# Patient Record
Sex: Female | Born: 1945 | Race: White | Hispanic: No | Marital: Married | Smoking: Never smoker
Health system: Southern US, Community
[De-identification: ages and names within clinical notes are randomized; demographics above are authoritative.]

## PROBLEM LIST (undated history)

## (undated) DIAGNOSIS — Q211 Atrial septal defect, unspecified: Secondary | ICD-10-CM

## (undated) DIAGNOSIS — Z9289 Personal history of other medical treatment: Secondary | ICD-10-CM

## (undated) DIAGNOSIS — K219 Gastro-esophageal reflux disease without esophagitis: Secondary | ICD-10-CM

## (undated) DIAGNOSIS — R519 Headache, unspecified: Secondary | ICD-10-CM

## (undated) DIAGNOSIS — Z87442 Personal history of urinary calculi: Secondary | ICD-10-CM

## (undated) DIAGNOSIS — Z9221 Personal history of antineoplastic chemotherapy: Secondary | ICD-10-CM

## (undated) DIAGNOSIS — Z923 Personal history of irradiation: Secondary | ICD-10-CM

## (undated) DIAGNOSIS — H409 Unspecified glaucoma: Secondary | ICD-10-CM

## (undated) DIAGNOSIS — I10 Essential (primary) hypertension: Secondary | ICD-10-CM

## (undated) DIAGNOSIS — R51 Headache: Secondary | ICD-10-CM

## (undated) DIAGNOSIS — E559 Vitamin D deficiency, unspecified: Secondary | ICD-10-CM

## (undated) HISTORY — DX: Vitamin D deficiency, unspecified: E55.9

## (undated) HISTORY — PX: UPPER GI ENDOSCOPY: SHX6162

## (undated) HISTORY — PX: ASD REPAIR: SHX258

## (undated) HISTORY — DX: Headache: R51

## (undated) HISTORY — DX: Headache, unspecified: R51.9

## (undated) HISTORY — PX: TOTAL HIP ARTHROPLASTY: SHX124

## (undated) HISTORY — DX: Atrial septal defect: Q21.1

## (undated) HISTORY — PX: COLONOSCOPY: SHX174

## (undated) HISTORY — PX: BREAST LUMPECTOMY: SHX2

## (undated) HISTORY — DX: Atrial septal defect, unspecified: Q21.10

---

## 2003-03-16 ENCOUNTER — Other Ambulatory Visit: Admission: RE | Admit: 2003-03-16 | Discharge: 2003-03-16 | Payer: Self-pay | Admitting: Internal Medicine

## 2004-10-29 ENCOUNTER — Ambulatory Visit (HOSPITAL_COMMUNITY): Admission: RE | Admit: 2004-10-29 | Discharge: 2004-10-29 | Payer: Self-pay | Admitting: Family Medicine

## 2008-12-19 ENCOUNTER — Encounter: Admission: RE | Admit: 2008-12-19 | Discharge: 2008-12-19 | Payer: Self-pay | Admitting: Family Medicine

## 2009-12-06 ENCOUNTER — Encounter: Admission: RE | Admit: 2009-12-06 | Discharge: 2009-12-06 | Payer: Self-pay | Admitting: Family Medicine

## 2011-09-28 ENCOUNTER — Other Ambulatory Visit: Payer: Self-pay | Admitting: Family Medicine

## 2011-09-28 DIAGNOSIS — E041 Nontoxic single thyroid nodule: Secondary | ICD-10-CM

## 2013-06-11 ENCOUNTER — Encounter (HOSPITAL_COMMUNITY): Payer: Self-pay | Admitting: Emergency Medicine

## 2013-06-11 ENCOUNTER — Observation Stay (HOSPITAL_COMMUNITY)
Admission: EM | Admit: 2013-06-11 | Discharge: 2013-06-12 | Disposition: A | Discharge status: Home / Self Care | Payer: BC Managed Care – PPO | Attending: Internal Medicine | Admitting: Internal Medicine

## 2013-06-11 ENCOUNTER — Emergency Department (HOSPITAL_COMMUNITY): Payer: BC Managed Care – PPO

## 2013-06-11 DIAGNOSIS — H81399 Other peripheral vertigo, unspecified ear: Secondary | ICD-10-CM | POA: Diagnosis not present

## 2013-06-11 DIAGNOSIS — R2681 Unsteadiness on feet: Secondary | ICD-10-CM

## 2013-06-11 DIAGNOSIS — I10 Essential (primary) hypertension: Secondary | ICD-10-CM | POA: Diagnosis present

## 2013-06-11 DIAGNOSIS — K219 Gastro-esophageal reflux disease without esophagitis: Secondary | ICD-10-CM | POA: Diagnosis present

## 2013-06-11 DIAGNOSIS — R42 Dizziness and giddiness: Secondary | ICD-10-CM | POA: Diagnosis present

## 2013-06-11 HISTORY — DX: Gastro-esophageal reflux disease without esophagitis: K21.9

## 2013-06-11 HISTORY — DX: Essential (primary) hypertension: I10

## 2013-06-11 LAB — CBC WITH DIFFERENTIAL/PLATELET
Basophils Relative: 1 % (ref 0–1)
Eosinophils Absolute: 0.1 10*3/uL (ref 0.0–0.7)
HCT: 35.4 % — ABNORMAL LOW (ref 36.0–46.0)
Lymphocytes Relative: 19 % (ref 12–46)
MCH: 30.2 pg (ref 26.0–34.0)
MCHC: 34.7 g/dL (ref 30.0–36.0)
MCV: 87 fL (ref 78.0–100.0)
Monocytes Absolute: 0.5 10*3/uL (ref 0.1–1.0)
Neutrophils Relative %: 73 % (ref 43–77)
Platelets: 304 10*3/uL (ref 150–400)

## 2013-06-11 LAB — URINALYSIS, ROUTINE W REFLEX MICROSCOPIC
Bilirubin Urine: NEGATIVE
Ketones, ur: NEGATIVE mg/dL
Urobilinogen, UA: 0.2 mg/dL (ref 0.0–1.0)
pH: 6.5 (ref 5.0–8.0)

## 2013-06-11 LAB — APTT: aPTT: 27 seconds (ref 24–37)

## 2013-06-11 LAB — BASIC METABOLIC PANEL
Calcium: 9.6 mg/dL (ref 8.4–10.5)
Chloride: 100 mEq/L (ref 96–112)
GFR calc non Af Amer: 62 mL/min — ABNORMAL LOW (ref >90)
Glucose, Bld: 171 mg/dL — ABNORMAL HIGH (ref 70–99)

## 2013-06-11 LAB — TROPONIN I: Troponin I: <0.3 ng/mL (ref <0.30)

## 2013-06-11 MED ORDER — ONDANSETRON HCL 4 MG/2ML IJ SOLN: 4.0000 mg | Freq: Four times a day (QID) | INTRAMUSCULAR | Status: DC | PRN

## 2013-06-11 MED ORDER — HYDROCODONE-ACETAMINOPHEN 5-325 MG PO TABS
1.0000 | ORAL_TABLET | ORAL | Status: DC | PRN
Start: 2013-06-11 — End: 2013-06-12
  Administered 2013-06-11 – 2013-06-12 (×3): 1 via ORAL
  Filled 2013-06-11 (×2): qty 1
  Filled 2013-06-11: qty 2

## 2013-06-11 MED ORDER — SODIUM CHLORIDE 0.9 % IV BOLUS (SEPSIS)
1000.0000 mL | Freq: Once | INTRAVENOUS | Status: AC
  Administered 2013-06-11: 1000 mL via INTRAVENOUS

## 2013-06-11 MED ORDER — MAGNESIUM SULFATE IN D5W 10-5 MG/ML-% IV SOLN
1.0000 g | Freq: Once | INTRAVENOUS | Status: AC
  Administered 2013-06-11: 1 g via INTRAVENOUS
  Filled 2013-06-11 (×2): qty 100

## 2013-06-11 MED ORDER — POLYETHYLENE GLYCOL 3350 17 G PO PACK
17.0000 g | PACK | Freq: Every day | ORAL | Status: DC | PRN
  Filled 2013-06-11: qty 1

## 2013-06-11 MED ORDER — METOCLOPRAMIDE HCL 5 MG/ML IJ SOLN
10.0000 mg | Freq: Once | INTRAMUSCULAR | Status: AC
  Administered 2013-06-11: 10 mg via INTRAVENOUS
  Filled 2013-06-11: qty 2

## 2013-06-11 MED ORDER — DIAZEPAM 2 MG PO TABS: 2.0000 mg | ORAL_TABLET | Freq: Once | ORAL | Status: DC

## 2013-06-11 MED ORDER — SODIUM CHLORIDE 0.9 % IV SOLN
INTRAVENOUS | Status: AC
  Administered 2013-06-11 – 2013-06-12 (×2): via INTRAVENOUS

## 2013-06-11 MED ORDER — HYDROCHLOROTHIAZIDE 25 MG PO TABS
25.0000 mg | ORAL_TABLET | Freq: Every day | ORAL | Status: DC
  Administered 2013-06-11 – 2013-06-12 (×2): 25 mg via ORAL
  Filled 2013-06-11 (×2): qty 1

## 2013-06-11 MED ORDER — SODIUM CHLORIDE 0.9 % IJ SOLN
3.0000 mL | Freq: Two times a day (BID) | INTRAMUSCULAR | Status: DC
  Administered 2013-06-12: 3 mL via INTRAVENOUS

## 2013-06-11 MED ORDER — ALUM & MAG HYDROXIDE-SIMETH 200-200-20 MG/5ML PO SUSP: 30.0000 mL | Freq: Four times a day (QID) | ORAL | Status: DC | PRN

## 2013-06-11 MED ORDER — GUAIFENESIN-DM 100-10 MG/5ML PO SYRP
5.0000 mL | ORAL_SOLUTION | ORAL | Status: DC | PRN
  Filled 2013-06-11: qty 5

## 2013-06-11 MED ORDER — MECLIZINE HCL 25 MG PO TABS
25.0000 mg | ORAL_TABLET | Freq: Three times a day (TID) | ORAL | Status: DC | PRN
  Administered 2013-06-11: 18:00:00 25 mg via ORAL
  Filled 2013-06-11 (×2): qty 1

## 2013-06-11 MED ORDER — MECLIZINE HCL 25 MG PO TABS
50.0000 mg | ORAL_TABLET | Freq: Once | ORAL | Status: AC
  Administered 2013-06-11: 50 mg via ORAL
  Filled 2013-06-11: qty 2

## 2013-06-11 MED ORDER — PANTOPRAZOLE SODIUM 40 MG PO TBEC
40.0000 mg | DELAYED_RELEASE_TABLET | Freq: Every day | ORAL | Status: DC
  Administered 2013-06-11 – 2013-06-12 (×2): 40 mg via ORAL
  Filled 2013-06-11 (×2): qty 1

## 2013-06-11 MED ORDER — DIAZEPAM 5 MG/ML IJ SOLN: 2.5000 mg | Freq: Two times a day (BID) | INTRAMUSCULAR | Status: DC

## 2013-06-11 MED ORDER — DIAZEPAM 2 MG PO TABS: 2.0000 mg | ORAL_TABLET | Freq: Two times a day (BID) | ORAL | Status: DC

## 2013-06-11 MED ORDER — DIAZEPAM 5 MG/ML IJ SOLN
2.5000 mg | Freq: Once | INTRAMUSCULAR | Status: AC
  Administered 2013-06-11: 2.5 mg via INTRAVENOUS
  Filled 2013-06-11: qty 2

## 2013-06-11 MED ORDER — LOSARTAN POTASSIUM 50 MG PO TABS
50.0000 mg | ORAL_TABLET | Freq: Every day | ORAL | Status: DC
  Administered 2013-06-11 – 2013-06-12 (×2): 50 mg via ORAL
  Filled 2013-06-11 (×2): qty 1

## 2013-06-11 MED ORDER — ASPIRIN EC 81 MG PO TBEC
81.0000 mg | DELAYED_RELEASE_TABLET | Freq: Every day | ORAL | Status: DC
  Administered 2013-06-11 – 2013-06-12 (×2): 81 mg via ORAL
  Filled 2013-06-11 (×2): qty 1

## 2013-06-11 MED ORDER — ONDANSETRON HCL 4 MG PO TABS: 4.0000 mg | ORAL_TABLET | Freq: Four times a day (QID) | ORAL | Status: DC | PRN

## 2013-06-11 MED ORDER — SODIUM CHLORIDE 0.9 % IV SOLN
Freq: Once | INTRAVENOUS | Status: AC
  Administered 2013-06-11: 10:00:00 via INTRAVENOUS

## 2013-06-11 MED ORDER — DIAZEPAM 2 MG PO TABS
2.0000 mg | ORAL_TABLET | Freq: Two times a day (BID) | ORAL | Status: DC
  Administered 2013-06-12: 2 mg via ORAL
  Filled 2013-06-11: qty 1

## 2013-06-11 MED ORDER — ALBUTEROL SULFATE (5 MG/ML) 0.5% IN NEBU: 2.5000 mg | INHALATION_SOLUTION | RESPIRATORY_TRACT | Status: DC | PRN

## 2013-06-11 NOTE — H&P (Signed)
[facility]                                                                                    Patient Demographics  Cynthia Vasquez, is a 67 y.o. female  MRN: [id number]   DOB - 16-Jan-1946  Admit Date - 06/11/2013  Outpatient Primary MD for the patient is No primary provider on file.   With History of -  Past Medical History  Diagnosis Date  . Hypertension   . GERD (gastroesophageal reflux disease)       History reviewed. No pertinent past surgical history.  in for   Chief Complaint  Patient presents with  . Dizziness     HPI  Cynthia Vasquez  is a 67 y.o. female, with history of hypertension, GERD who recently had some debris buildup and left ear and had it flushed out by her PCP 2 days ago, she subsequently had some swelling in the left side of her throat, this morning when she wokeup she had an episode of nonstop vertigo which she describes a sensation of spinning along with mild nausea, she was unable to stand or walk, she came to the ER where her head CT and MRI was unremarkable, case was discussed with neurology diagnosed her with peripheral vertigo and requested hospitalist to admit .  Patient denies any headache, no ringing in ears, no hearing loss, no discharge from ears, no fever chills, no chest pain or palpitations, no abdominal pain or diarrhea. No focal weakness the    Review of Systems    In addition to the HPI above,   No Fever-chills, No Headache, No changes with Vision or hearing, No problems swallowing food or Liquids, No Chest pain, Cough or Shortness of Breath, No Abdominal pain, No Nausea or Vommitting, Bowel movements are regular, No Blood in stool or Urine, No dysuria, No new skin rashes or bruises, No new joints pains-aches,  No new weakness, tingling, numbness in any extremity, positive vertigo as above No recent weight gain or loss, No polyuria, polydypsia or polyphagia, No significant Mental Stressors.  A full 10 point Review of Systems  was done, except as stated above, all other Review of Systems were negative.   Social History History  Substance Use Topics  . Smoking status: Never Smoker   . Smokeless tobacco: Not on file  . Alcohol Use: No      Family History No family history of CVA  Prior to Admission medications   Medication Sig Start Date End Date Taking? Authorizing Provider  acetaminophen (TYLENOL) 500 MG tablet Take 500 mg by mouth every 6 (six) hours as needed.   Yes Historical Provider, MD  aspirin EC 81 MG tablet Take 81 mg by mouth daily.   Yes Historical Provider, MD  hydrochlorothiazide (HYDRODIURIL) 25 MG tablet Take 25 mg by mouth daily.   Yes Historical Provider, MD  losartan (COZAAR) 50 MG tablet Take 50 mg by mouth daily.   Yes Historical Provider, MD  pantoprazole (PROTONIX) 40 MG tablet Take 40 mg by mouth daily.   Yes Historical Provider, MD    No Known Allergies  Physical Exam  Vitals  Blood pressure 141/68,  pulse 80, temperature 98.1 F (36.7 C), temperature source Oral, resp. rate 20, SpO2 96.00%.   1. General middle-aged obese Caucasian female lying in bed appears tired but in no discomfort  2. Normal affect and insight, Not Suicidal or Homicidal, Awake Alert, Oriented X 3.  3. No F.N deficits, ALL C.Nerves Intact, Strength 5/5 all 4 extremities, Sensation intact all 4 extremities, Plantars down going.  4. Eyes appear Normal, Conjunctivae clear, PERRLA. Moist Oral Mucosa, left ear with possible small left tympanic membrane disruption, possibly some fluid behind the ear, no pus, no unusual discharge.  5. Supple Neck, No JVD, No cervical lymphadenopathy appriciated, No Carotid Bruits.  6. Symmetrical Chest wall movement, Good air movement bilaterally, CTAB.  7. RRR, No Gallops, Rubs or Murmurs, No Parasternal Heave.  8. Positive Bowel Sounds, Abdomen Soft, Non tender, No organomegaly appriciated,No rebound -guarding or rigidity.  9.  No Cyanosis, Normal Skin Turgor, No Skin  Rash or Bruise.  10. Good muscle tone,  joints appear normal , no effusions, Normal ROM.  11. No Palpable Lymph Nodes in Neck or Axillae     Data Review  CBC  Recent Labs Lab 06/11/13 0830  WBC 8.6  HGB 12.3  HCT 35.4*  PLT 304  MCV 87.0  MCH 30.2  MCHC 34.7  RDW 13.3  LYMPHSABS 1.7  MONOABS 0.5  EOSABS 0.1  BASOSABS 0.0   ------------------------------------------------------------------------------------------------------------------  Chemistries   Recent Labs Lab 06/11/13 0830  NA 139  K 3.6  CL 100  CO2 25  GLUCOSE 171*  BUN 28*  CREATININE 0.94  CALCIUM 9.6   ------------------------------------------------------------------------------------------------------------------ CrCl is unknown because there is no height on file for the current visit. ------------------------------------------------------------------------------------------------------------------ No results found for this basename: TSH, T4TOTAL, FREET3, T3FREE, THYROIDAB,  in the last 72 hours   Coagulation profile  Recent Labs Lab 06/11/13 0830  INR 0.96   ------------------------------------------------------------------------------------------------------------------- No results found for this basename: DDIMER,  in the last 72 hours -------------------------------------------------------------------------------------------------------------------  Cardiac Enzymes  Recent Labs Lab 06/11/13 0830  TROPONINI <0.30   ------------------------------------------------------------------------------------------------------------------ No components found with this basename: POCBNP,    ---------------------------------------------------------------------------------------------------------------  Urinalysis No results found for this basename: colorurine, appearanceur, labspec, phurine, glucoseu, hgbur, bilirubinur, ketonesur, proteinur, urobilinogen, nitrite, leukocytesur     ----------------------------------------------------------------------------------------------------------------  Imaging results:   Ct Head Wo Contrast  06/11/2013   CLINICAL DATA:  Nausea and dizziness.  EXAM: CT HEAD WITHOUT CONTRAST  TECHNIQUE: Contiguous axial images were obtained from the base of the skull through the vertex without intravenous contrast.  COMPARISON:  None.  FINDINGS: Mild small vessel ischemic changes are noted in the periventricular white matter. The brain demonstrates no evidence of hemorrhage, infarction, edema, mass effect, extra-axial fluid collection, hydrocephalus or mass lesion. The skull is unremarkable.  IMPRESSION: Mild small vessel disease. No acute findings.   Electronically Signed   By: Irish Lack M.D.   On: 06/11/2013 08:51   Mr Brain Wo Contrast  06/11/2013   CLINICAL DATA:  Severe vertigo and nausea.  EXAM: MRI HEAD WITHOUT CONTRAST  TECHNIQUE: Multiplanar, multiecho pulse sequences of the brain and surrounding structures were obtained without intravenous contrast.  COMPARISON:  Head CT from the same day.  FINDINGS: The diffusion-weighted images demonstrate no evidence for acute or subacute infarction. A relatively empty sella is present. Midline structures are otherwise within normal limits.  Moderate white matter changes are present within the central pons. Remote lacunar infarcts are present at the cerebellum bilaterally. There are small remote lacunar infarcts  of the basal ganglia bilaterally with a larger lesion in the anterior left lentiform nucleus. Moderate periventricular and subcortical T2 hyperintensities are greater than expected for age.  Flow is present in the major intracranial arteries. The globes and orbits are intact.  Mild mucosal thickening is present in the ethmoid air cells and frontal sinuses bilaterally, right greater than left. Minimal fluid is present in the mastoid air cells. No obstructing nasopharyngeal lesion is evident.   IMPRESSION: 1. No acute intracranial abnormality. 2. Advanced white matter changes throughout the brain and brainstem. This is nonspecific, but likely reflects the sequela of chronic microvascular ischemia. 3. 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 The most prominent area is in the anterior left lentiform nucleus. 4. Minimal sinus disease.   Electronically Signed   By: Gennette Pac M.D.   On: 06/11/2013 12:41    My personal review of EKG: Rhythm NSR,  QTC slightly prolonged at 500 ms, no Acute ST changes    Assessment & Plan   1. Vertigo certainly related to left ear problem caused by left ear being flushed at PCPs office to remove debris - possible small left eardrum rupture, certainly could have irritated her left inner ear, currently no signs of infection, surprisingly she does not have any nystagmus at this point, she will be kept in the hospital, supportive care with scheduled Valium and as needed Antivert, PT OT evaluation, fall precaution. We'll monitor her rhythm although this clearly is vertigo caused by left ear problem. Will recommend ENT followup post discharge.   2. Mildly prolonged QTC. 1 g of magnesium IV, monitor electrolytes, telemetry monitor.    3. History of GERD and hypertension. Continue home medications unchanged.   DVT Prophylaxis  SCDs   AM Labs Ordered, also please review Full Orders  Family Communication: Admission, patients condition and plan of care including tests being ordered have been discussed with the patient and husband who indicate understanding and agree with the plan and Code Status.  Code Status full  Likely DC to  home  Condition Fair  Time spent in minutes : 40    Susa Raring K M.D on 06/11/2013 at 3:44 PM  Between 7am to 7pm - Pager - [phone]  After 7pm go to [email or web address] - password [email or web address]  And look for the night coverage person covering me after hours  [facility] Office   [phone]

## 2013-06-11 NOTE — ED Provider Notes (Addendum)
CSN: [id number]     Arrival date & time 06/11/13  0754 History   First MD Initiated Contact with Patient 06/11/13 0800     Chief Complaint  Patient presents with  . Dizziness   (Consider location/radiation/quality/duration/timing/severity/associated sxs/prior Treatment) HPI Comments: Pt comes in with cc of dizziness. Hx of HTN. Pt reports waking up with severe dizziness -described as vertigo. The dizziness is constant, and is worse with certain position. Pt denies headaches, visual complains, numbness, tingling, no chest pains, tinnitus or hearing loss.  Pt is having some nausea. No hx of strokes. Pt was asymptomatic last night. No new meds.  The history is provided by the patient.    Past Medical History  Diagnosis Date  . Hypertension   . GERD (gastroesophageal reflux disease)    History reviewed. No pertinent past surgical history. History reviewed. No pertinent family history. History  Substance Use Topics  . Smoking status: Never Smoker   . Smokeless tobacco: Not on file  . Alcohol Use: No   OB History   Grav Para Term Preterm Abortions TAB SAB Ect Mult Living                 Review of Systems  Constitutional: Positive for activity change. Negative for fatigue.  HENT: Negative for facial swelling.   Eyes: Negative for photophobia and visual disturbance.  Respiratory: Negative for cough, shortness of breath and wheezing.   Cardiovascular: Negative for chest pain.  Gastrointestinal: Positive for nausea. Negative for vomiting, abdominal pain, diarrhea, constipation, blood in stool and abdominal distention.  Genitourinary: Negative for hematuria and difficulty urinating.  Musculoskeletal: Negative for neck pain and neck stiffness.  Skin: Negative for color change.  Neurological: Positive for dizziness and light-headedness. Negative for speech difficulty.  Hematological: Does not bruise/bleed easily.  Psychiatric/Behavioral: Negative for confusion.    Allergies  Review  of patient's allergies indicates no known allergies.  Home Medications   Current Outpatient Rx  Name  Route  Sig  Dispense  Refill  . acetaminophen (TYLENOL) 500 MG tablet   Oral   Take 500 mg by mouth every 6 (six) hours as needed.         Marland Kitchen aspirin EC 81 MG tablet   Oral   Take 81 mg by mouth daily.         Marland Kitchen HYDROCHLOROTHIAZIDE PO   Oral   Take 1 tablet by mouth daily.         Marland Kitchen LISINOPRIL PO   Oral   Take 1 tablet by mouth daily.         . Pantoprazole Sodium (PROTONIX PO)   Oral   Take 1 tablet by mouth daily.          BP 129/52  Pulse 74  Temp(Src) 97.5 F (36.4 C) (Oral)  Resp 13  SpO2 96% Physical Exam  Nursing note and vitals reviewed. Constitutional: She is oriented to person, place, and time. She appears well-developed and well-nourished.  HENT:  Head: Normocephalic and atraumatic.  Eyes: EOM are normal. Pupils are equal, round, and reactive to light.  Neck: Neck supple.  Cardiovascular: Normal rate, regular rhythm and normal heart sounds.   No murmur heard. Pulmonary/Chest: Effort normal. No respiratory distress.  Abdominal: Soft. She exhibits no distension. There is no tenderness. There is no rebound and no guarding.  Neurological: She is alert and oriented to person, place, and time.  Pt gives a poot neuro exam, possibly due to poor effort, as she states  that she is feeling unwell. PERRL Saccadic eye movement on lateral gaze - both sides. Mild nasolabial flattening left side + dysmetria on finger to nose Sensory exam normal for bilateral upper and lower extremities - and patient is able to discriminate between sharp and dull. Motor exam bilateral weakness on grip strength.    Skin: Skin is warm and dry.    ED Course  Procedures (including critical care time) Labs Review Labs Reviewed  CBC WITH DIFFERENTIAL - Abnormal; Notable for the following:    HCT 35.4 (*)    All other components within normal limits  APTT  PROTIME-INR   BASIC METABOLIC PANEL  TROPONIN I  URINALYSIS, ROUTINE W REFLEX MICROSCOPIC   Imaging Review Ct Head Wo Contrast  06/11/2013   CLINICAL DATA:  Nausea and dizziness.  EXAM: CT HEAD WITHOUT CONTRAST  TECHNIQUE: Contiguous axial images were obtained from the base of the skull through the vertex without intravenous contrast.  COMPARISON:  None.  FINDINGS: Mild small vessel ischemic changes are noted in the periventricular white matter. The brain demonstrates no evidence of hemorrhage, infarction, edema, mass effect, extra-axial fluid collection, hydrocephalus or mass lesion. The skull is unremarkable.  IMPRESSION: Mild small vessel disease. No acute findings.   Electronically Signed   By: Irish Lack M.D.   On: 06/11/2013 08:51    EKG Interpretation     Ventricular Rate:  76 PR Interval:  155 QRS Duration: 102 QT Interval:  446 QTC Calculation: 501 R Axis:   60 Text Interpretation:  Sinus rhythm Prolonged QT interval            MDM  No diagnosis found.  Pt comes in with cc of dizziness and vertigo.  DDx includes: Central vertigo:  Tumor  Stroke  ICH  Vertebrobasilar TIA  Peripheral Vertigo:  BPPV  Vestibular neuritis  Meniere disease  Migrainous vertigo  Ear Infection   Pt comes in with constant dizziness, worse with any movement. Last normal last night. Hard to tell whether this is peripheral or a central cause based on the hx and exam alone - as there is a constant vertigo (central) worse with position (peripheral). No focal neuro deficits. Pt just not comfrotable due to the vertigo, and gives a poor exam. CT head, will reassess for possible MRI.   Derwood Kaplan, MD 06/11/13 0917  9:46 AM CT head is normal. Labs that are in are WNL. Pt feels     Derwood Kaplan, MD 06/11/13 [phone] 3:36 PM MRI is neg. Pt still unstable, s/p antivert x 2 and valium Will admit.  Derwood Kaplan, MD 06/11/13 1536

## 2013-06-11 NOTE — Progress Notes (Signed)
NURSING PROGRESS NOTE  Cynthia Vasquez [id number] Admission Data: 06/11/2013 8:12 PM Attending Provider: Leroy Sea, MD PCP:No primary provider on file. Code Status: Full   Cynthia Vasquez is a 67 y.o. female patient admitted from ED:  -No acute distress noted.  -No complaints of shortness of breath.  -No complaints of chest pain.   Cardiac Monitoring: Box # 10 in place. Cardiac monitor yields:normal sinus rhythm.  Blood pressure 148/77, pulse 78, temperature 98.1 F (36.7 C), temperature source Oral, resp. rate 18, height 5\' 1"  (1.549 m), weight 80.6 kg (177 lb 11.1 oz), SpO2 94.00%.   IV Fluids:  IV in place, occlusive dsg intact without redness, IV cath hand right, condition patent and no redness normal saline.   Allergies:  Review of patient's allergies indicates no known allergies.  Past Medical History:   has a past medical history of Hypertension and GERD (gastroesophageal reflux disease).  Past Surgical History:   has no past surgical history on file.  Social History:   reports that she has never smoked. She does not have any smokeless tobacco history on file. She reports that she does not drink alcohol.  Skin: Intact  Patient/Family orientated to room. Information packet given to patient/family. Admission inpatient armband information verified with patient/family to include name and date of birth and placed on patient arm. Side rails up x 2, fall assessment and education completed with patient/family. Patient/family able to verbalize understanding of risk associated with falls and verbalized understanding to call for assistance before getting out of bed. Call light within reach. Patient/family able to voice and demonstrate understanding of unit orientation instructions.    Will continue to evaluate and treat per MD orders.

## 2013-06-11 NOTE — ED Notes (Signed)
Attempted to ambulated pt, pt states nausea, slight dizziness. Will attempt tp ambulate to after nausea passes.

## 2013-06-11 NOTE — ED Notes (Signed)
Patient transported to MRI 

## 2013-06-11 NOTE — ED Notes (Addendum)
Per EMS, sudden onset of dizziness. sts some nausea. Zofran in route. BP 130/50. Pt anxious.

## 2013-06-12 DIAGNOSIS — H81399 Other peripheral vertigo, unspecified ear: Secondary | ICD-10-CM | POA: Diagnosis not present

## 2013-06-12 DIAGNOSIS — R269 Unspecified abnormalities of gait and mobility: Secondary | ICD-10-CM

## 2013-06-12 LAB — BASIC METABOLIC PANEL
BUN: 16 mg/dL (ref 6–23)
CO2: 25 mEq/L (ref 19–32)
Chloride: 105 mEq/L (ref 96–112)
Creatinine, Ser: 0.88 mg/dL (ref 0.50–1.10)
Glucose, Bld: 92 mg/dL (ref 70–99)
Sodium: 139 mEq/L (ref 135–145)

## 2013-06-12 LAB — CBC
HCT: 34.9 % — ABNORMAL LOW (ref 36.0–46.0)
MCH: 29.9 pg (ref 26.0–34.0)
MCHC: 33.8 g/dL (ref 30.0–36.0)
MCV: 88.6 fL (ref 78.0–100.0)
RDW: 13.6 % (ref 11.5–15.5)
WBC: 7.8 10*3/uL (ref 4.0–10.5)

## 2013-06-12 MED ORDER — DIAZEPAM 2 MG PO TABS: 2.0000 mg | ORAL_TABLET | Freq: Two times a day (BID) | ORAL | Status: DC | PRN

## 2013-06-12 MED ORDER — MECLIZINE HCL 50 MG PO TABS: 50.0000 mg | ORAL_TABLET | Freq: Three times a day (TID) | ORAL | Status: DC | PRN

## 2013-06-12 MED ORDER — MECLIZINE HCL 25 MG PO TABS
25.0000 mg | ORAL_TABLET | Freq: Three times a day (TID) | ORAL | Status: DC | PRN
  Filled 2013-06-12: qty 1

## 2013-06-12 MED ORDER — MECLIZINE HCL 25 MG PO TABS
25.0000 mg | ORAL_TABLET | Freq: Three times a day (TID) | ORAL | Status: DC
  Administered 2013-06-12: 25 mg via ORAL
  Filled 2013-06-12 (×2): qty 1

## 2013-06-12 MED ORDER — POTASSIUM CHLORIDE CRYS ER 20 MEQ PO TBCR
40.0000 meq | EXTENDED_RELEASE_TABLET | Freq: Once | ORAL | Status: AC
  Administered 2013-06-12: 40 meq via ORAL
  Filled 2013-06-12: qty 2

## 2013-06-12 MED ORDER — MECLIZINE HCL 25 MG PO TABS
50.0000 mg | ORAL_TABLET | Freq: Once | ORAL | Status: AC
  Administered 2013-06-12: 50 mg via ORAL
  Filled 2013-06-12: qty 2

## 2013-06-12 NOTE — Progress Notes (Signed)
OT Cancellation Note  Patient Details Name: Cynthia Vasquez MRN: [id number] DOB: 12-Feb-1946   After discussion with PT and review of they PT evaluation feel all of pt's issues are related to her BPPV and was completely independent before this admission.  Will sign off and let PT address vestibular needs on acute and inpatient.   Rodolphe Edmonston 06/12/2013, 12:42 PM

## 2013-06-12 NOTE — Progress Notes (Signed)
Jamal Collin to be D/C'd Home per MD order.  Discussed with the patient and all questions fully answered.    Medication List    STOP taking these medications       hydrochlorothiazide 25 MG tablet  Commonly known as:  HYDRODIURIL      TAKE these medications       acetaminophen 500 MG tablet  Commonly known as:  TYLENOL  Take 500 mg by mouth every 6 (six) hours as needed.     aspirin EC 81 MG tablet  Take 81 mg by mouth daily.     diazepam 2 MG tablet  Commonly known as:  VALIUM  Take 1 tablet (2 mg total) by mouth every 12 (twelve) hours as needed for anxiety (dizziness).     losartan 50 MG tablet  Commonly known as:  COZAAR  Take 50 mg by mouth daily.     meclizine 50 MG tablet  Commonly known as:  ANTIVERT  Take 1 tablet (50 mg total) by mouth 3 (three) times daily as needed for dizziness.     pantoprazole 40 MG tablet  Commonly known as:  PROTONIX  Take 40 mg by mouth daily.        VVS, Skin clean, dry and intact without evidence of skin break down, no evidence of skin tears noted. IV catheter discontinued intact. Site without signs and symptoms of complications. Dressing and pressure applied.  An After Visit Summary was printed and given to the patient. Patient escorted via WC, and D/C home via private auto.  Laurel Dimmer 06/12/2013 4:42 PM

## 2013-06-12 NOTE — Discharge Summary (Signed)
[facility]                                                                                   Cynthia Vasquez, is a 67 y.o. female  DOB 1946-01-09  MRN [id number].  Admission date:  06/11/2013  Admitting Physician  Leroy Sea, MD  Discharge Date:  06/12/2013   Primary MD  No primary provider on file.  Recommendations for primary care physician for things to follow:   Monitor her vertigo clinically, please make sure she follows with ENT on a close basis   Admission Diagnosis  Vertigo [780.4] GERD (gastroesophageal reflux disease) [530.81] Hypertension [401.9] Gait instability [781.2] Peripheral vertigo, unspecified laterality [386.10]  Discharge Diagnosis   vertigo due to left inner ear problem  Principal Problem:   Vertigo Active Problems:   Hypertension   GERD (gastroesophageal reflux disease)      Past Medical History  Diagnosis Date  . Hypertension   . GERD (gastroesophageal reflux disease)     History reviewed. No pertinent past surgical history.   Discharge Condition: Stable   Follow-up Information   Follow up with PCP. Schedule an appointment as soon as possible for a visit in 3 days.      Follow up with Suzanna Obey, MD. Schedule an appointment as soon as possible for a visit in 1 day. (ENT)    Specialty:  Otolaryngology   Contact information:   [address] [place] [address] [phone]       Follow up with [facility]. Schedule an appointment as soon as possible for a visit in 2 days. (Vestibular rehab)    Contact information:   [address] [place] [address] [phone]        Consults obtained - neurology, ENT Dr. Jearld Fenton over the phone   Discharge Medications      Medication List    STOP taking these medications       hydrochlorothiazide 25 MG tablet  Commonly known as:  HYDRODIURIL      TAKE these medications       acetaminophen 500 MG tablet  Commonly known as:   TYLENOL  Take 500 mg by mouth every 6 (six) hours as needed.     aspirin EC 81 MG tablet  Take 81 mg by mouth daily.     diazepam 2 MG tablet  Commonly known as:  VALIUM  Take 1 tablet (2 mg total) by mouth every 12 (twelve) hours as needed for anxiety (dizziness).     losartan 50 MG tablet  Commonly known as:  COZAAR  Take 50 mg by mouth daily.     meclizine 50 MG tablet  Commonly known as:  ANTIVERT  Take 1 tablet (50 mg total) by mouth 3 (three) times daily as needed for dizziness.     pantoprazole 40 MG tablet  Commonly known as:  PROTONIX  Take 40 mg by mouth daily.         Diet and Activity recommendation: See Discharge Instructions below   Discharge Instructions     Follow with Primary MD  in 3 days   Get CBC, CMP,  checked 3 days by Primary MD and again as instructed by your Primary MD.    Get Medicines reviewed and adjusted.  Please request your Prim.MD to go over all Hospital Tests and Procedure/Radiological results at the follow up, please get all Hospital records sent to your Prim MD by signing hospital release before you go home.  Activity: As tolerated with Full fall precautions use walker/cane & assistance as needed   Diet:  Heart Healthy  For Heart failure patients - Check your Weight same time everyday, if you gain over 2 pounds, or you develop in leg swelling, experience more shortness of breath or chest pain, call your Primary MD immediately. Follow Cardiac Low Salt Diet and 1.8 lit/day fluid restriction.  Disposition Home   If you experience worsening of your admission symptoms, develop shortness of breath, life threatening emergency, suicidal or homicidal thoughts you must seek medical attention immediately by calling 911 or calling your MD immediately  if symptoms less severe.  You Must read complete instructions/literature along with all the possible adverse reactions/side effects for all the Medicines you take and that have been prescribed to  you. Take any new Medicines after you have completely understood and accpet all the possible adverse reactions/side effects.   Do not drive and provide baby sitting services if your were admitted for syncope or siezures until you have seen by Primary MD or a Neurologist and advised to do so again.  Do not drive when taking Pain medications.    Do not take more than prescribed Pain, Sleep and Anxiety Medications  Special Instructions: If you have smoked or chewed Tobacco  in the last 2 yrs please stop smoking, stop any regular Alcohol  and or any Recreational drug use.  Wear Seat belts while driving.   Please note  You were cared for by a hospitalist during your hospital stay. If you have any questions about your discharge medications or the care you received while you were in the hospital after you are discharged, you can call the unit and asked to speak with the hospitalist on call if the hospitalist that took care of you is not available. Once you are discharged, your primary care physician will handle any further medical issues. Please note that NO REFILLS for any discharge medications will be authorized once you are discharged, as it is imperative that you return to your primary care physician (or establish a relationship with a primary care physician if you do not have one) for your aftercare needs so that they can reassess your need for medications and monitor your lab values.  Major procedures and Radiology Reports - PLEASE review detailed and final reports for all details, in brief -       Ct Head Wo Contrast  06/11/2013   CLINICAL DATA:  Nausea and dizziness.  EXAM: CT HEAD WITHOUT CONTRAST  TECHNIQUE: Contiguous axial images were obtained from the base of the skull through the vertex without intravenous contrast.  COMPARISON:  None.  FINDINGS: Mild small vessel ischemic changes are noted in the periventricular white matter. The brain demonstrates no evidence of hemorrhage,  infarction, edema, mass effect, extra-axial fluid collection, hydrocephalus or mass lesion. The skull is unremarkable.  IMPRESSION: Mild small vessel disease. No acute findings.   Electronically Signed   By: Irish Lack M.D.   On: 06/11/2013 08:51   Mr Brain Wo Contrast  06/11/2013   CLINICAL DATA:  Severe vertigo and nausea.  EXAM: MRI HEAD WITHOUT CONTRAST  TECHNIQUE:  Multiplanar, multiecho pulse sequences of the brain and surrounding structures were obtained without intravenous contrast.  COMPARISON:  Head CT from the same day.  FINDINGS: The diffusion-weighted images demonstrate no evidence for acute or subacute infarction. A relatively empty sella is present. Midline structures are otherwise within normal limits.  Moderate white matter changes are present within the central pons. Remote lacunar infarcts are present at the cerebellum bilaterally. There are small remote lacunar infarcts of the basal ganglia bilaterally with a larger lesion in the anterior left lentiform nucleus. Moderate periventricular and subcortical T2 hyperintensities are greater than expected for age.  Flow is present in the major intracranial arteries. The globes and orbits are intact.  Mild mucosal thickening is present in the ethmoid air cells and frontal sinuses bilaterally, right greater than left. Minimal fluid is present in the mastoid air cells. No obstructing nasopharyngeal lesion is evident.  IMPRESSION: 1. No acute intracranial abnormality. 2. Advanced white matter changes throughout the brain and brainstem. This is nonspecific, but likely reflects the sequela of chronic microvascular ischemia. 3. 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 The most prominent area is in the anterior left lentiform nucleus. 4. Minimal sinus disease.   Electronically Signed   By: Gennette Pac M.D.   On: 06/11/2013 12:41    Micro Results      No results found for this or any previous visit (from the past  240 hour(s)).   History of present illness and  Hospital Course:     Kindly see H&P for history of present illness and admission details, please review complete Labs, Consult reports and Test reports for all details in brief Cynthia Vasquez, is a 67 y.o. female, patient with history of  hypertension, GERD who recently had her left ear irrigated and flushed at PCP office for debris buildup in the left ear presented to the hospital a few days after the procedure with severe case of vertigo, she was initially ruled out for stroke in the ER with a negative CT and MRI of the brain, neurology saw the patient and they suggested that her symptoms were consistent with vertigo from in her ear problem which I concur with. Patient with neck movement has worsening of her symptoms and develops horizontal nystagmus, on exam she has mildly perforated left eardrum.   She was seen here by PT OT, will get a walker, will be discharged home on meclizine and Valium for supportive care. She has been given vestibular retraining by PT and OT in the hospital and she will get the same in the outpatient setting by the neurology office. I have discussed her case with ENT physician on call Dr. Jearld Fenton who said that patient will need an audio gram at his office tomorrow and he will see her promptly at that time. This was discussed with patient and husband who both agree with it. They will go home and follow with ENT in the outpatient setting.    Low potassium has been replaced.     Today   Subjective:   Cynthia Vasquez today has no headache,no chest abdominal pain,no new weakness tingling or numbness, feels much better wants to go home today.    Objective:   Blood pressure 146/81, pulse 86, temperature 98.3 F (36.8 C), temperature source Oral, resp. rate 18, height 5\' 1"  (1.549 m), weight 80.6 kg (177 lb 11.1 oz), SpO2 93.00%.   Intake/Output Summary (Last 24 hours) at 06/12/13 1211 Last data filed at 06/12/13 0901  Gross  per 24 hour  Intake    870 ml  Output   2500 ml  Net  -1630 ml    Exam Awake Alert, Oriented *3, No new F.N deficits, Normal affect [place].AT,PERRAL, left tympanic membrane has mild rupture without any discharge, she does have horizontal nystagmus Supple Neck,No JVD, No cervical lymphadenopathy appriciated.  Symmetrical Chest wall movement, Good air movement bilaterally, CTAB RRR,No Gallops,Rubs or new Murmurs, No Parasternal Heave +ve B.Sounds, Abd Soft, Non tender, No organomegaly appriciated, No rebound -guarding or rigidity. No Cyanosis, Clubbing or edema, No new Rash or bruise  Data Review   CBC w Diff: Lab Results  Component Value Date   WBC 7.8 06/12/2013   HGB 11.8* 06/12/2013   HCT 34.9* 06/12/2013   PLT 285 06/12/2013   LYMPHOPCT 19 06/11/2013   MONOPCT 6 06/11/2013   EOSPCT 1 06/11/2013   BASOPCT 1 06/11/2013    CMP: Lab Results  Component Value Date   NA 139 06/12/2013   K 3.4* 06/12/2013   CL 105 06/12/2013   CO2 25 06/12/2013   BUN 16 06/12/2013   CREATININE 0.88 06/12/2013  .   Total Time in preparing paper work, data evaluation and todays exam - 35 minutes  Leroy Sea M.D on 06/12/2013 at 12:11 PM  [facility] Office  [phone]

## 2013-06-12 NOTE — Evaluation (Signed)
Physical Therapy Evaluation Patient Details Name: Cynthia Vasquez MRN: [id number] DOB: [birth year] Today's Date: 06/12/2013 Time: 0902-0953 PT Time Calculation (min): 51 min  PT Assessment / Plan / Recommendation History of Present Illness  Cynthia Vasquez  is a 67 y.o. female, with history of hypertension, GERD who recently had some debris buildup and left ear and had it flushed out by her PCP 2 days ago, she subsequently had some swelling in the left side of her throat, this morning when she wokeup she had an episode of nonstop vertigo which she describes a sensation of spinning along with mild nausea, she was unable to stand or walk, she came to the ER where her head CT and MRI was unremarkable, case was discussed with neurology diagnosed her with peripheral vertigo and requested hospitalist to admit .  Clinical Impression  Assessed BP in supine and was [facility], therefore had pt roll to R side in order to quickly assess horizontal canal.  Noted increased nystagmus, indicating positive for R horizontal BPPV.  No further c/o light headedness, therefore did not assess BP.  Then had pt sit up from R SL and noted some mild torsional nystagmus, however was stronger in horizontal, therefore initiated BBQ roll canalith repositioning maneuver to treat BPPV.  Pt very nauseous during treatment, however was able to complete.  Educated pt to what BPPV was, treatment course, and follow up at D/C.  Pt in agreement.  Educated pt to rest for next hour and limit movement to allow inner ear crystals to settle.  Pt/husband verbalize understanding.  Will have OT check with pt later and if needed, this clinician can check back to reassess.  Would recommend pt follow up at outpatient (neurorehabilitation) for more specialized assessment/treatment.  MD notified.     PT Assessment  Patient needs continued PT services    Follow Up Recommendations  Outpatient PT (neurorehabilitation OP)    Does the patient have the potential to  tolerate intense rehabilitation      Barriers to Discharge        Equipment Recommendations  None recommended by PT    Recommendations for Other Services OT consult   Frequency Min 3X/week    Precautions / Restrictions Precautions Precautions: Fall Precaution Comments: horizontal and R BPPV Restrictions Weight Bearing Restrictions: No   Pertinent Vitals/Pain Mild pain in top of head.       Mobility  Bed Mobility Bed Mobility: Rolling Right;Right Sidelying to Sit Rolling Right: 4: Min assist Right Sidelying to Sit: 4: Min assist Details for Bed Mobility Assistance: Min assist for trunk to obtain full R SL position and also for elevating trunk into sitting due to increased dizziness.  Upon rolling to the R, noted increased B eye nystagmus.  Also noted when transitioning from R SL to sitting position, she has slight R (unclear whether R or L) torsional nystagmus.   Transfers Transfers: Sit to Stand;Stand to Sit Sit to Stand: 4: Min assist;From bed Stand to Sit: 4: Min assist;To bed Details for Transfer Assistance: Min assist due to pt anxiety about getting dizzy once up.  Note that she no longer had dizziness once up in standing, therefore did not assess BP any further (was 151/78 in lying).   Ambulation/Gait Ambulation/Gait Assistance: 4: Min assist Ambulation Distance (Feet): 80 Feet Assistive device: 1 person hand held assist Ambulation/Gait Assistance Details: HHA due to increased anxiety with ambulation.  Cued pt for visual target, which she noted to cause improvement in dizziness, while gait training.  Gait Pattern: Step-through pattern;Decreased stride length    Exercises     PT Diagnosis: Difficulty walking  PT Problem List: Decreased balance;Decreased mobility PT Treatment Interventions: Gait training;Stair training;Functional mobility training;Therapeutic activities;Neuromuscular re-education;Balance training;Therapeutic exercise     PT Goals(Current goals can be  found in the care plan section) Acute Rehab PT Goals Patient Stated Goal: to get rid of this dizziness PT Goal Formulation: With patient/family Time For Goal Achievement: 06/14/13 Potential to Achieve Goals: Good  Visit Information  Last PT Received On: 06/12/13 Assistance Needed: +1 History of Present Illness: Cynthia Vasquez  is a 67 y.o. female, with history of hypertension, GERD who recently had some debris buildup and left ear and had it flushed out by her PCP 2 days ago, she subsequently had some swelling in the left side of her throat, this morning when she wokeup she had an episode of nonstop vertigo which she describes a sensation of spinning along with mild nausea, she was unable to stand or walk, she came to the ER where her head CT and MRI was unremarkable, case was discussed with neurology diagnosed her with peripheral vertigo and requested hospitalist to admit .       Prior Functioning  Home Living Family/patient expects to be discharged to:: Private residence Living Arrangements: Spouse/significant other Available Help at Discharge: Family Type of Home: House Home Equipment: None Prior Function Level of Independence: Independent Communication Communication: No difficulties    Cognition  Cognition Arousal/Alertness: Awake/alert Behavior During Therapy: WFL for tasks assessed/performed Overall Cognitive Status: Within Functional Limits for tasks assessed    Extremity/Trunk Assessment Lower Extremity Assessment Lower Extremity Assessment: Overall WFL for tasks assessed Cervical / Trunk Assessment Cervical / Trunk Assessment: Normal   Balance Balance Balance Assessed: Yes Static Standing Balance Static Standing - Balance Support: No upper extremity supported Static Standing - Level of Assistance: 5: Stand by assistance Static Standing - Comment/# of Minutes: Pt able to stand at bedside at stand by assist for approx 2 mins prior to ambulation in hallway.   End of  Session PT - End of Session Activity Tolerance: Other (comment) (limited by dizziness) Patient left: in bed;with call bell/phone within reach;with family/visitor present Nurse Communication: Mobility status;Other (comment) (discussed allowing her to rest due to BPPV)  GP Functional Assessment Tool Used: Clinical judgement Functional Limitation: Mobility: Walking and moving around Mobility: Walking and Moving Around Current Status ([id number]): At least 1 percent but less than 20 percent impaired, limited or restricted Mobility: Walking and Moving Around Goal Status [phone]): 0 percent impaired, limited or restricted   Vista Deck 06/12/2013, 10:16 AM

## 2013-06-12 NOTE — Plan of Care (Signed)
Problem: Phase I Progression Outcomes Goal: Initial discharge plan identified Outcome: Completed/Met Date Met:  06/12/13 To return home with husband

## 2013-06-12 NOTE — Progress Notes (Signed)
Physical Therapy Treatment Patient Details Name: Cynthia Vasquez MRN: [id number] DOB: March 10, 1946 Today's Date: 06/12/2013 Time: 8469-6295 PT Time Calculation (min): 37 min  PT Assessment / Plan / Recommendation  History of Present Illness     PT Comments   Pt continues to be very dizzy with horizontal nystagmus during second session.  Assisted pt by performing G[place] canalith maneuver this afternoon with hopes of repositioning otoliths.  Feel that pt would benefit from staying in hospital one more night in order to reassess tomorrow.  RN made aware.   Follow Up Recommendations  Outpatient PT     Does the patient have the potential to tolerate intense rehabilitation     Barriers to Discharge        Equipment Recommendations  None recommended by PT    Recommendations for Other Services    Frequency Min 3X/week   Progress towards PT Goals Progress towards PT goals: Progressing toward goals  Plan Current plan remains appropriate    Precautions / Restrictions Precautions Precautions: Fall Precaution Comments: horizontal and R BPPV Restrictions Weight Bearing Restrictions: No   Pertinent Vitals/Pain No pain    Mobility  Bed Mobility Bed Mobility: Rolling Right;Right Sidelying to Sit Rolling Right: 4: Min assist Right Sidelying to Sit: 4: Min assist Details for Bed Mobility Assistance: Min assist for trunk to obtain full R SL position and also for elevating trunk into sitting due to increased dizziness.  Upon rolling to the R, noted increased B eye nystagmus.  Also noted when transitioning from R SL to sitting position, she has slight R (unclear whether R or L) torsional nystagmus.   Transfers Transfers: Sit to Stand;Stand to Sit Sit to Stand: 4: Min assist;From bed Stand to Sit: 4: Min assist;To bed Details for Transfer Assistance: Min assist due to pt anxiety about getting dizzy once up.  Note that she no longer had dizziness once up in standing, therefore did not assess BP  any further (was 151/78 in lying).      Exercises     PT Diagnosis:    PT Problem List:   PT Treatment Interventions:     PT Goals (current goals can now be found in the care plan section) Acute Rehab PT Goals Patient Stated Goal: to get rid of this dizziness PT Goal Formulation: With patient/family Time For Goal Achievement: 06/14/13 Potential to Achieve Goals: Good  Visit Information  Last PT Received On: 06/12/13    Subjective Data  Subjective: I still don't feel good when I roll Patient Stated Goal: to get rid of this dizziness   Cognition  Cognition Arousal/Alertness: Awake/alert Behavior During Therapy: WFL for tasks assessed/performed Overall Cognitive Status: Within Functional Limits for tasks assessed    Balance     End of Session PT - End of Session Activity Tolerance: Other (comment) (Limited by dizziness) Patient left: in bed;with call bell/phone within reach;with family/visitor present Nurse Communication: Mobility status;Other (comment)   GP Functional Assessment Tool Used: Clinical judgement Functional Limitation: Mobility: Walking and moving around Mobility: Walking and Moving Around Current Status [phone]): At least 1 percent but less than 20 percent impaired, limited or restricted Mobility: Walking and Moving Around Goal Status [phone]): 0 percent impaired, limited or restricted Mobility: Walking and Moving Around Discharge Status [phone]): 0 percent impaired, limited or restricted   Vista Deck 06/12/2013, 4:47 PM

## 2013-06-12 NOTE — Progress Notes (Signed)
UR COMPLETED  

## 2013-06-12 NOTE — Care Management Note (Signed)
    Page 1 of 1   06/12/2013     12:35:23 PM   CARE MANAGEMENT NOTE 06/12/2013  Patient:  Cynthia Vasquez, Cynthia Vasquez   Account Number:  [id number]  Date Initiated:  06/12/2013  Documentation initiated by:  Letha Cape  Subjective/Objective Assessment:   dx vertigo  admit as observation.  lives with spouse.     Action/Plan:   pt eval- rec outp pt- referral faxed for outpt pt.   Anticipated DC Date:  06/12/2013   Anticipated DC Plan:  HOME/SELF CARE      DC Planning Services  CM consult      Choice offered to / List presented to:             Status of service:  Completed, signed off Medicare Important Message given?   (If response is "NO", the following Medicare IM given date fields will be blank) Date Medicare IM given:   Date Additional Medicare IM given:    Discharge Disposition:  HOME/SELF CARE  Per UR Regulation:  Reviewed for med. necessity/level of care/duration of stay  If discussed at Long Length of Stay Meetings, dates discussed:    Comments:  06/12/13 12:33 Letha Cape RN, BSN [phone] patient lives with spouse, per physical therapy recs out pt pt.  [facility] Patient for dc today.

## 2013-06-12 NOTE — Progress Notes (Signed)
OT Cancellation Note  Patient Details Name: HARTLYN REIGEL MRN: [id number] DOB: Jul 05, 1946   Cancelled Treatment:    Reason Eval/Treat Not Completed: Pain limiting ability to participate (Pt nauseas and wanting to rest at this time.) Will check back as time permits.  Myrth Dahan OTR/L 06/12/2013, 10:43 AM

## 2014-09-04 DIAGNOSIS — H2513 Age-related nuclear cataract, bilateral: Secondary | ICD-10-CM | POA: Diagnosis not present

## 2014-09-04 DIAGNOSIS — H40033 Anatomical narrow angle, bilateral: Secondary | ICD-10-CM | POA: Diagnosis not present

## 2014-09-08 ENCOUNTER — Encounter: Payer: Self-pay | Admitting: *Deleted

## 2015-01-31 ENCOUNTER — Other Ambulatory Visit: Payer: Self-pay | Admitting: Family Medicine

## 2015-01-31 DIAGNOSIS — Z1389 Encounter for screening for other disorder: Secondary | ICD-10-CM | POA: Diagnosis not present

## 2015-01-31 DIAGNOSIS — Z0000 Encounter for general adult medical examination without abnormal findings: Secondary | ICD-10-CM | POA: Diagnosis not present

## 2015-01-31 DIAGNOSIS — E041 Nontoxic single thyroid nodule: Secondary | ICD-10-CM | POA: Diagnosis not present

## 2015-01-31 DIAGNOSIS — K219 Gastro-esophageal reflux disease without esophagitis: Secondary | ICD-10-CM | POA: Diagnosis not present

## 2015-01-31 DIAGNOSIS — E559 Vitamin D deficiency, unspecified: Secondary | ICD-10-CM | POA: Diagnosis not present

## 2015-01-31 DIAGNOSIS — I10 Essential (primary) hypertension: Secondary | ICD-10-CM | POA: Diagnosis not present

## 2015-01-31 DIAGNOSIS — E780 Pure hypercholesterolemia: Secondary | ICD-10-CM | POA: Diagnosis not present

## 2015-01-31 DIAGNOSIS — Z79899 Other long term (current) drug therapy: Secondary | ICD-10-CM | POA: Diagnosis not present

## 2015-02-07 ENCOUNTER — Ambulatory Visit
Admission: RE | Admit: 2015-02-07 | Discharge: 2015-02-07 | Disposition: A | Discharge status: Home / Self Care | Payer: Commercial Managed Care - HMO | Source: Ambulatory Visit | Attending: Family Medicine | Admitting: Family Medicine

## 2015-02-07 DIAGNOSIS — E041 Nontoxic single thyroid nodule: Secondary | ICD-10-CM

## 2015-02-07 DIAGNOSIS — E042 Nontoxic multinodular goiter: Secondary | ICD-10-CM | POA: Diagnosis not present

## 2015-02-08 DIAGNOSIS — Z1231 Encounter for screening mammogram for malignant neoplasm of breast: Secondary | ICD-10-CM | POA: Diagnosis not present

## 2015-02-08 DIAGNOSIS — Z780 Asymptomatic menopausal state: Secondary | ICD-10-CM | POA: Diagnosis not present

## 2015-03-08 ENCOUNTER — Ambulatory Visit (INDEPENDENT_AMBULATORY_CARE_PROVIDER_SITE_OTHER): Payer: Commercial Managed Care - HMO | Admitting: Diagnostic Neuroimaging

## 2015-03-08 ENCOUNTER — Encounter: Payer: Self-pay | Admitting: Diagnostic Neuroimaging

## 2015-03-08 VITALS — BP 128/70 | HR 72 | Ht 60.0 in | Wt 170.8 lb

## 2015-03-08 DIAGNOSIS — I679 Cerebrovascular disease, unspecified: Secondary | ICD-10-CM

## 2015-03-08 DIAGNOSIS — R42 Dizziness and giddiness: Secondary | ICD-10-CM

## 2015-03-08 NOTE — Progress Notes (Signed)
[facility]  PATIENT: Cynthia Vasquez DOB: 1945-10-10  REFERRING CLINICIAN: Edwin Dada  HISTORY FROM: patient  REASON FOR VISIT: new consult    HISTORICAL  CHIEF COMPLAINT:  Chief Complaint  Patient presents with  . Transient Ischemic Attack    rm 7, h/o TIA per MRI, New patient     HISTORY OF PRESENT ILLNESS:   69 year old female with hypertension, migraine, history of atrial septal defect status post repair at age 60 years old, here for evaluation of abnormal MRI and possible stroke.  06/11/13, patient had onset of head spinning, nausea, vertigo symptoms, occurring 2 days after left external auditory canal debris irrigation at PCP office. Patient was admitted for evaluation. She was diagnosed with peripheral vestibulopathy. MRI of the brain showed chronic small vessel see me disease without acute findings.  Patient has never had unilateral numbness, weakness, slurred speech, facial droop, unilateral visual loss or other strokelike symptoms.  Patient is doing well overall. She does struggle with some insomnia and mild anxiety symptoms but overall doing well.  Patient has some history of migraine headaches since age 57 years old which have been stable.  REVIEW OF SYSTEMS: Full 14 system review of systems performed and notable only for only as per history of present illness.  ALLERGIES: No Known Allergies  HOME MEDICATIONS: Outpatient Prescriptions Prior to Visit  Medication Sig Dispense Refill  . acetaminophen (TYLENOL) 500 MG tablet Take 500 mg by mouth every 6 (six) hours as needed.    . calcium citrate-vitamin D (CITRACAL+D) 315-200 MG-UNIT per tablet Take 1 tablet by mouth daily.    . cholecalciferol (VITAMIN D) 1000 UNITS tablet Take 1,000 Units by mouth daily.    . diazepam (VALIUM) 2 MG tablet Take 1 tablet (2 mg total) by mouth every 12 (twelve) hours as needed for anxiety (dizziness). 20 tablet 0  . hydrochlorothiazide (HYDRODIURIL) 25 MG tablet  Take 25 mg by mouth daily.    Marland Kitchen losartan (COZAAR) 50 MG tablet Take 50 mg by mouth daily.    . Magnesium 300 MG CAPS Take 1 capsule by mouth daily.    . meclizine (ANTIVERT) 50 MG tablet Take 1 tablet (50 mg total) by mouth 3 (three) times daily as needed for dizziness. 30 tablet 0  . Multiple Vitamins-Minerals (MULTIVITAMIN WITH MINERALS) tablet Take 1 tablet by mouth daily.    . Omega-3 Fatty Acids (FISH OIL) 1000 MG CAPS Take 1 capsule by mouth daily.    . pantoprazole (PROTONIX) 40 MG tablet Take 40 mg by mouth daily.    Marland Kitchen aspirin EC 81 MG tablet Take 81 mg by mouth daily.     No facility-administered medications prior to visit.    PAST MEDICAL HISTORY: Past Medical History  Diagnosis Date  . Hypertension   . GERD (gastroesophageal reflux disease)   . Atrial septal defect     repaired age 67  . Vitamin D deficiency   . Headache     migraines until age 60- 24, have them 3-4 x year    PAST SURGICAL HISTORY: Past Surgical History  Procedure Laterality Date  . Asd repair      age 22    FAMILY HISTORY: Family History  Problem Relation Age of Onset  . Dementia Mother   . Heart attack Father   . Hypertension Father     SOCIAL HISTORY:  History   Social History  . Marital Status: Married    Spouse Name: [name]  . Number of Children:  3  . Years of Education: 16   Occupational History  . teacher     retired   Social History Main Topics  . Smoking status: Never Smoker   . Smokeless tobacco: Not on file  . Alcohol Use: No  . Drug Use: No  . Sexual Activity: Not on file   Other Topics Concern  . Not on file   Social History Narrative   Lives at home with husband   Caffeine use- green tea 1-2 glasses a day     PHYSICAL EXAM  GENERAL EXAM/CONSTITUTIONAL: Vitals:  Filed Vitals:   03/08/15 1057  BP: 128/70  Pulse: 72  Height: 5' (1.524 m)  Weight: 170 lb 12.8 oz (77.474 kg)     Body mass index is 33.36 kg/(m^2).  Visual Acuity Screening   Right  eye Left eye Both eyes  Without correction:     With correction: 20/30 20/40      Patient is in no distress; well developed, nourished and groomed; neck is supple  CARDIOVASCULAR:  Examination of carotid arteries is normal; no carotid bruits  Regular rate and rhythm, no murmurs  Examination of peripheral vascular system by observation and palpation is normal  EYES:  Ophthalmoscopic exam of optic discs and posterior segments is normal; no papilledema or hemorrhages  MUSCULOSKELETAL:  Gait, strength, tone, movements noted in Neurologic exam below  NEUROLOGIC: MENTAL STATUS:  No flowsheet data found.  awake, alert, oriented to person, place and time  recent and remote memory intact  normal attention and concentration  language fluent, comprehension intact, naming intact,   fund of knowledge appropriate  CRANIAL NERVE:   2nd - no papilledema on fundoscopic exam  2nd, 3rd, 4th, 6th - pupils equal and reactive to light, visual fields full to confrontation, extraocular muscles intact, no nystagmus  5th - facial sensation symmetric  7th - facial strength symmetric  8th - hearing intact  9th - palate elevates symmetrically, uvula midline  11th - shoulder shrug symmetric  12th - tongue protrusion midline  MOTOR:   normal bulk and tone, full strength in the BUE, BLE  SENSORY:   normal and symmetric to light touch, pinprick, temperature, vibration  COORDINATION:   finger-nose-finger, fine finger movements normal  REFLEXES:   deep tendon reflexes present and symmetric  GAIT/STATION:   narrow based gait; able to walk tandem; romberg is negative    DIAGNOSTIC DATA (LABS, IMAGING, TESTING) - I reviewed patient records, labs, notes, testing and imaging myself where available.  Lab Results  Component Value Date   WBC 7.8 06/12/2013   HGB 11.8* 06/12/2013   HCT 34.9* 06/12/2013   MCV 88.6 06/12/2013   PLT 285 06/12/2013      Component Value  Date/Time   NA 139 06/12/2013 0545   K 3.4* 06/12/2013 0545   CL 105 06/12/2013 0545   CO2 25 06/12/2013 0545   GLUCOSE 92 06/12/2013 0545   BUN 16 06/12/2013 0545   CREATININE 0.88 06/12/2013 0545   CALCIUM 8.8 06/12/2013 0545   GFRNONAA 67* 06/12/2013 0545   GFRAA 78* 06/12/2013 0545   No results found for: CHOL, HDL, LDLCALC, LDLDIRECT, TRIG, CHOLHDL No results found for: HGBA1C No results found for: VITAMINB12 No results found for: TSH   06/11/13 MRI brain [I reviewed images myself and agree with interpretation, although I would consider all the white matter lesions within the spectrum of chronic small vessel ischemic disease, as patient never had focal stroke symptoms. -VRP]  1. No  acute intracranial abnormality. 2. Advanced white matter changes throughout the brain and brainstem. This is nonspecific, but likely reflects the sequela of chronic microvascular ischemia. 3. 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 The most prominent area is in the anterior left lentiform nucleus. 4. Minimal sinus disease.     ASSESSMENT AND PLAN  69 y.o. year old female here with history of vertigo attack November 2014, diagnosed with peripheral vestibulopathy. MRI brain shows chronic small vessel ischemic disease changes likely related to hypertension. No acute findings. No further workup advised.  Dx:  Cerebrovascular disease  Vertigo   PLAN: - start aspirin 81mg  daily - continue BP control - healthy habits reviewed with nutrition, physical activity, sleep   Return if symptoms worsen or fail to improve, for return to PCP.    Penni Bombard, MD 0/08/270, 53:66 AM Certified in Neurology, Neurophysiology and Neuroimaging  [facility] [address] [place], [place] [address] [phone]

## 2015-03-08 NOTE — Patient Instructions (Signed)
Start aspirin 81mg  daily.  Continue other medications.

## 2015-04-09 DIAGNOSIS — S46819A Strain of other muscles, fascia and tendons at shoulder and upper arm level, unspecified arm, initial encounter: Secondary | ICD-10-CM | POA: Diagnosis not present

## 2016-01-01 DIAGNOSIS — N183 Chronic kidney disease, stage 3 (moderate): Secondary | ICD-10-CM | POA: Diagnosis not present

## 2016-01-01 DIAGNOSIS — I10 Essential (primary) hypertension: Secondary | ICD-10-CM | POA: Diagnosis not present

## 2016-01-01 DIAGNOSIS — E041 Nontoxic single thyroid nodule: Secondary | ICD-10-CM | POA: Diagnosis not present

## 2016-01-01 DIAGNOSIS — K219 Gastro-esophageal reflux disease without esophagitis: Secondary | ICD-10-CM | POA: Diagnosis not present

## 2016-01-01 DIAGNOSIS — E7800 Pure hypercholesterolemia, unspecified: Secondary | ICD-10-CM | POA: Diagnosis not present

## 2016-01-01 DIAGNOSIS — E559 Vitamin D deficiency, unspecified: Secondary | ICD-10-CM | POA: Diagnosis not present

## 2016-01-03 ENCOUNTER — Other Ambulatory Visit: Payer: Self-pay | Admitting: Family Medicine

## 2016-01-03 DIAGNOSIS — E041 Nontoxic single thyroid nodule: Secondary | ICD-10-CM

## 2016-02-12 ENCOUNTER — Ambulatory Visit
Admission: RE | Admit: 2016-02-12 | Discharge: 2016-02-12 | Disposition: A | Discharge status: Home / Self Care | Payer: Commercial Managed Care - HMO | Source: Ambulatory Visit | Attending: Family Medicine | Admitting: Family Medicine

## 2016-02-12 DIAGNOSIS — E041 Nontoxic single thyroid nodule: Secondary | ICD-10-CM

## 2016-09-08 DIAGNOSIS — H2513 Age-related nuclear cataract, bilateral: Secondary | ICD-10-CM | POA: Diagnosis not present

## 2016-09-08 DIAGNOSIS — H40013 Open angle with borderline findings, low risk, bilateral: Secondary | ICD-10-CM | POA: Diagnosis not present

## 2016-11-03 ENCOUNTER — Other Ambulatory Visit: Payer: Self-pay | Admitting: Family Medicine

## 2016-11-03 DIAGNOSIS — J069 Acute upper respiratory infection, unspecified: Secondary | ICD-10-CM | POA: Diagnosis not present

## 2016-11-03 DIAGNOSIS — E041 Nontoxic single thyroid nodule: Secondary | ICD-10-CM

## 2016-11-03 DIAGNOSIS — N183 Chronic kidney disease, stage 3 (moderate): Secondary | ICD-10-CM | POA: Diagnosis not present

## 2016-11-03 DIAGNOSIS — K219 Gastro-esophageal reflux disease without esophagitis: Secondary | ICD-10-CM | POA: Diagnosis not present

## 2016-11-03 DIAGNOSIS — I10 Essential (primary) hypertension: Secondary | ICD-10-CM | POA: Diagnosis not present

## 2016-11-03 DIAGNOSIS — E559 Vitamin D deficiency, unspecified: Secondary | ICD-10-CM | POA: Diagnosis not present

## 2016-11-03 DIAGNOSIS — E7800 Pure hypercholesterolemia, unspecified: Secondary | ICD-10-CM | POA: Diagnosis not present

## 2016-11-12 DIAGNOSIS — H401131 Primary open-angle glaucoma, bilateral, mild stage: Secondary | ICD-10-CM | POA: Diagnosis not present

## 2016-12-03 ENCOUNTER — Other Ambulatory Visit: Payer: Self-pay | Admitting: Family Medicine

## 2016-12-03 DIAGNOSIS — Z1231 Encounter for screening mammogram for malignant neoplasm of breast: Secondary | ICD-10-CM

## 2016-12-22 ENCOUNTER — Ambulatory Visit: Payer: Commercial Managed Care - HMO

## 2016-12-23 ENCOUNTER — Ambulatory Visit
Admission: RE | Admit: 2016-12-23 | Discharge: 2016-12-23 | Disposition: A | Discharge status: Home / Self Care | Payer: Medicare HMO | Source: Ambulatory Visit | Attending: Family Medicine | Admitting: Family Medicine

## 2016-12-23 DIAGNOSIS — E041 Nontoxic single thyroid nodule: Secondary | ICD-10-CM

## 2016-12-23 DIAGNOSIS — Z1231 Encounter for screening mammogram for malignant neoplasm of breast: Secondary | ICD-10-CM | POA: Diagnosis not present

## 2016-12-23 DIAGNOSIS — E042 Nontoxic multinodular goiter: Secondary | ICD-10-CM | POA: Diagnosis not present

## 2017-02-19 DIAGNOSIS — H40013 Open angle with borderline findings, low risk, bilateral: Secondary | ICD-10-CM | POA: Diagnosis not present

## 2017-05-21 DIAGNOSIS — H401122 Primary open-angle glaucoma, left eye, moderate stage: Secondary | ICD-10-CM | POA: Diagnosis not present

## 2017-06-07 DIAGNOSIS — I10 Essential (primary) hypertension: Secondary | ICD-10-CM | POA: Diagnosis not present

## 2017-06-07 DIAGNOSIS — E041 Nontoxic single thyroid nodule: Secondary | ICD-10-CM | POA: Diagnosis not present

## 2017-06-07 DIAGNOSIS — E559 Vitamin D deficiency, unspecified: Secondary | ICD-10-CM | POA: Diagnosis not present

## 2017-06-07 DIAGNOSIS — E7800 Pure hypercholesterolemia, unspecified: Secondary | ICD-10-CM | POA: Diagnosis not present

## 2017-06-07 DIAGNOSIS — R42 Dizziness and giddiness: Secondary | ICD-10-CM | POA: Diagnosis not present

## 2017-06-07 DIAGNOSIS — K219 Gastro-esophageal reflux disease without esophagitis: Secondary | ICD-10-CM | POA: Diagnosis not present

## 2017-06-07 DIAGNOSIS — N183 Chronic kidney disease, stage 3 (moderate): Secondary | ICD-10-CM | POA: Diagnosis not present

## 2017-09-24 DIAGNOSIS — H401131 Primary open-angle glaucoma, bilateral, mild stage: Secondary | ICD-10-CM | POA: Diagnosis not present

## 2018-01-11 ENCOUNTER — Other Ambulatory Visit: Payer: Self-pay | Admitting: Family Medicine

## 2018-01-11 DIAGNOSIS — E041 Nontoxic single thyroid nodule: Secondary | ICD-10-CM

## 2018-01-21 DIAGNOSIS — H5212 Myopia, left eye: Secondary | ICD-10-CM | POA: Diagnosis not present

## 2018-01-21 DIAGNOSIS — H5201 Hypermetropia, right eye: Secondary | ICD-10-CM | POA: Diagnosis not present

## 2018-01-21 DIAGNOSIS — H401131 Primary open-angle glaucoma, bilateral, mild stage: Secondary | ICD-10-CM | POA: Diagnosis not present

## 2018-01-28 ENCOUNTER — Ambulatory Visit
Admission: RE | Admit: 2018-01-28 | Discharge: 2018-01-28 | Disposition: A | Discharge status: Home / Self Care | Payer: Medicare HMO | Source: Ambulatory Visit | Attending: Family Medicine | Admitting: Family Medicine

## 2018-01-28 DIAGNOSIS — E041 Nontoxic single thyroid nodule: Secondary | ICD-10-CM

## 2018-01-28 DIAGNOSIS — E042 Nontoxic multinodular goiter: Secondary | ICD-10-CM | POA: Diagnosis not present

## 2018-02-15 DIAGNOSIS — K219 Gastro-esophageal reflux disease without esophagitis: Secondary | ICD-10-CM | POA: Diagnosis not present

## 2018-02-15 DIAGNOSIS — E7800 Pure hypercholesterolemia, unspecified: Secondary | ICD-10-CM | POA: Diagnosis not present

## 2018-02-15 DIAGNOSIS — E041 Nontoxic single thyroid nodule: Secondary | ICD-10-CM | POA: Diagnosis not present

## 2018-02-15 DIAGNOSIS — I10 Essential (primary) hypertension: Secondary | ICD-10-CM | POA: Diagnosis not present

## 2018-02-15 DIAGNOSIS — E559 Vitamin D deficiency, unspecified: Secondary | ICD-10-CM | POA: Diagnosis not present

## 2018-02-15 DIAGNOSIS — N183 Chronic kidney disease, stage 3 (moderate): Secondary | ICD-10-CM | POA: Diagnosis not present

## 2018-02-17 DIAGNOSIS — I10 Essential (primary) hypertension: Secondary | ICD-10-CM | POA: Diagnosis not present

## 2018-02-17 DIAGNOSIS — E559 Vitamin D deficiency, unspecified: Secondary | ICD-10-CM | POA: Diagnosis not present

## 2018-02-17 DIAGNOSIS — N183 Chronic kidney disease, stage 3 (moderate): Secondary | ICD-10-CM | POA: Diagnosis not present

## 2018-02-17 DIAGNOSIS — K219 Gastro-esophageal reflux disease without esophagitis: Secondary | ICD-10-CM | POA: Diagnosis not present

## 2018-02-17 DIAGNOSIS — E7800 Pure hypercholesterolemia, unspecified: Secondary | ICD-10-CM | POA: Diagnosis not present

## 2018-02-17 DIAGNOSIS — E041 Nontoxic single thyroid nodule: Secondary | ICD-10-CM | POA: Diagnosis not present

## 2018-03-17 DIAGNOSIS — R945 Abnormal results of liver function studies: Secondary | ICD-10-CM | POA: Diagnosis not present

## 2018-03-25 DIAGNOSIS — M545 Low back pain: Secondary | ICD-10-CM | POA: Diagnosis not present

## 2018-03-25 DIAGNOSIS — R7989 Other specified abnormal findings of blood chemistry: Secondary | ICD-10-CM | POA: Diagnosis not present

## 2018-03-29 ENCOUNTER — Other Ambulatory Visit: Payer: Self-pay | Admitting: Family Medicine

## 2018-03-29 DIAGNOSIS — R945 Abnormal results of liver function studies: Principal | ICD-10-CM

## 2018-03-29 DIAGNOSIS — R7989 Other specified abnormal findings of blood chemistry: Secondary | ICD-10-CM

## 2018-04-06 ENCOUNTER — Ambulatory Visit
Admission: RE | Admit: 2018-04-06 | Discharge: 2018-04-06 | Disposition: A | Discharge status: Home / Self Care | Payer: Medicare HMO | Source: Ambulatory Visit | Attending: Family Medicine | Admitting: Family Medicine

## 2018-04-06 DIAGNOSIS — R7989 Other specified abnormal findings of blood chemistry: Secondary | ICD-10-CM

## 2018-04-06 DIAGNOSIS — R945 Abnormal results of liver function studies: Principal | ICD-10-CM

## 2018-04-18 DIAGNOSIS — K8050 Calculus of bile duct without cholangitis or cholecystitis without obstruction: Secondary | ICD-10-CM | POA: Diagnosis not present

## 2018-04-21 ENCOUNTER — Other Ambulatory Visit: Payer: Self-pay | Admitting: General Surgery

## 2018-04-21 DIAGNOSIS — K8050 Calculus of bile duct without cholangitis or cholecystitis without obstruction: Secondary | ICD-10-CM

## 2018-04-29 ENCOUNTER — Ambulatory Visit
Admission: RE | Admit: 2018-04-29 | Discharge: 2018-04-29 | Disposition: A | Discharge status: Home / Self Care | Payer: Medicare HMO | Source: Ambulatory Visit | Attending: General Surgery | Admitting: General Surgery

## 2018-04-29 DIAGNOSIS — R932 Abnormal findings on diagnostic imaging of liver and biliary tract: Secondary | ICD-10-CM | POA: Diagnosis not present

## 2018-04-29 DIAGNOSIS — K8050 Calculus of bile duct without cholangitis or cholecystitis without obstruction: Secondary | ICD-10-CM

## 2018-04-29 DIAGNOSIS — R7989 Other specified abnormal findings of blood chemistry: Secondary | ICD-10-CM | POA: Diagnosis not present

## 2018-04-29 MED ORDER — GADOBENATE DIMEGLUMINE 529 MG/ML IV SOLN
15.0000 mL | Freq: Once | INTRAVENOUS | Status: AC | PRN
  Administered 2018-04-29: 15 mL via INTRAVENOUS

## 2018-05-23 DIAGNOSIS — H401131 Primary open-angle glaucoma, bilateral, mild stage: Secondary | ICD-10-CM | POA: Diagnosis not present

## 2018-05-30 DIAGNOSIS — K219 Gastro-esophageal reflux disease without esophagitis: Secondary | ICD-10-CM | POA: Diagnosis not present

## 2018-05-30 DIAGNOSIS — R748 Abnormal levels of other serum enzymes: Secondary | ICD-10-CM | POA: Diagnosis not present

## 2018-08-23 DIAGNOSIS — E7800 Pure hypercholesterolemia, unspecified: Secondary | ICD-10-CM | POA: Diagnosis not present

## 2018-08-23 DIAGNOSIS — I10 Essential (primary) hypertension: Secondary | ICD-10-CM | POA: Diagnosis not present

## 2018-08-23 DIAGNOSIS — K219 Gastro-esophageal reflux disease without esophagitis: Secondary | ICD-10-CM | POA: Diagnosis not present

## 2018-08-23 DIAGNOSIS — Z23 Encounter for immunization: Secondary | ICD-10-CM | POA: Diagnosis not present

## 2018-08-23 DIAGNOSIS — N183 Chronic kidney disease, stage 3 (moderate): Secondary | ICD-10-CM | POA: Diagnosis not present

## 2018-08-23 DIAGNOSIS — M545 Low back pain: Secondary | ICD-10-CM | POA: Diagnosis not present

## 2018-08-23 DIAGNOSIS — E559 Vitamin D deficiency, unspecified: Secondary | ICD-10-CM | POA: Diagnosis not present

## 2018-09-05 DIAGNOSIS — M5416 Radiculopathy, lumbar region: Secondary | ICD-10-CM | POA: Diagnosis not present

## 2018-09-05 DIAGNOSIS — M545 Low back pain: Secondary | ICD-10-CM | POA: Diagnosis not present

## 2018-09-16 DIAGNOSIS — M545 Low back pain: Secondary | ICD-10-CM | POA: Diagnosis not present

## 2018-09-20 DIAGNOSIS — H401131 Primary open-angle glaucoma, bilateral, mild stage: Secondary | ICD-10-CM | POA: Diagnosis not present

## 2018-09-23 DIAGNOSIS — M545 Low back pain: Secondary | ICD-10-CM | POA: Diagnosis not present

## 2018-09-29 ENCOUNTER — Other Ambulatory Visit: Payer: Self-pay | Admitting: Orthopedic Surgery

## 2018-09-29 DIAGNOSIS — G8929 Other chronic pain: Secondary | ICD-10-CM

## 2018-09-29 DIAGNOSIS — M545 Low back pain, unspecified: Secondary | ICD-10-CM

## 2018-10-06 ENCOUNTER — Other Ambulatory Visit: Payer: Self-pay | Admitting: Orthopedic Surgery

## 2018-10-06 DIAGNOSIS — M545 Low back pain: Principal | ICD-10-CM

## 2018-10-06 DIAGNOSIS — G8929 Other chronic pain: Secondary | ICD-10-CM

## 2018-10-18 ENCOUNTER — Other Ambulatory Visit: Payer: Medicare HMO

## 2019-01-20 DIAGNOSIS — H401131 Primary open-angle glaucoma, bilateral, mild stage: Secondary | ICD-10-CM | POA: Diagnosis not present

## 2019-04-07 DIAGNOSIS — K219 Gastro-esophageal reflux disease without esophagitis: Secondary | ICD-10-CM | POA: Diagnosis not present

## 2019-04-07 DIAGNOSIS — I10 Essential (primary) hypertension: Secondary | ICD-10-CM | POA: Diagnosis not present

## 2019-04-07 DIAGNOSIS — E7800 Pure hypercholesterolemia, unspecified: Secondary | ICD-10-CM | POA: Diagnosis not present

## 2019-04-07 DIAGNOSIS — E559 Vitamin D deficiency, unspecified: Secondary | ICD-10-CM | POA: Diagnosis not present

## 2019-04-07 DIAGNOSIS — N183 Chronic kidney disease, stage 3 (moderate): Secondary | ICD-10-CM | POA: Diagnosis not present

## 2019-04-07 DIAGNOSIS — E041 Nontoxic single thyroid nodule: Secondary | ICD-10-CM | POA: Diagnosis not present

## 2019-05-22 DIAGNOSIS — H401131 Primary open-angle glaucoma, bilateral, mild stage: Secondary | ICD-10-CM | POA: Diagnosis not present

## 2019-05-22 DIAGNOSIS — H5212 Myopia, left eye: Secondary | ICD-10-CM | POA: Diagnosis not present

## 2019-06-21 DIAGNOSIS — N183 Chronic kidney disease, stage 3 unspecified: Secondary | ICD-10-CM | POA: Diagnosis not present

## 2019-06-21 DIAGNOSIS — E559 Vitamin D deficiency, unspecified: Secondary | ICD-10-CM | POA: Diagnosis not present

## 2019-06-21 DIAGNOSIS — Z23 Encounter for immunization: Secondary | ICD-10-CM | POA: Diagnosis not present

## 2019-06-21 DIAGNOSIS — E7800 Pure hypercholesterolemia, unspecified: Secondary | ICD-10-CM | POA: Diagnosis not present

## 2019-06-21 DIAGNOSIS — K219 Gastro-esophageal reflux disease without esophagitis: Secondary | ICD-10-CM | POA: Diagnosis not present

## 2019-06-21 DIAGNOSIS — E041 Nontoxic single thyroid nodule: Secondary | ICD-10-CM | POA: Diagnosis not present

## 2019-06-21 DIAGNOSIS — I10 Essential (primary) hypertension: Secondary | ICD-10-CM | POA: Diagnosis not present

## 2019-06-23 ENCOUNTER — Other Ambulatory Visit: Payer: Self-pay

## 2019-06-23 DIAGNOSIS — Z20822 Contact with and (suspected) exposure to covid-19: Secondary | ICD-10-CM

## 2019-06-26 LAB — NOVEL CORONAVIRUS, NAA: SARS-CoV-2, NAA: DETECTED — AB

## 2019-06-28 ENCOUNTER — Inpatient Hospital Stay (HOSPITAL_COMMUNITY)
Admission: EM | Admit: 2019-06-28 | Discharge: 2019-07-04 | DRG: 177 | Disposition: A | Discharge status: Home / Self Care | Payer: Medicare HMO | Attending: Internal Medicine | Admitting: Internal Medicine

## 2019-06-28 ENCOUNTER — Emergency Department (HOSPITAL_COMMUNITY): Payer: Medicare HMO

## 2019-06-28 ENCOUNTER — Other Ambulatory Visit: Payer: Self-pay

## 2019-06-28 DIAGNOSIS — J9601 Acute respiratory failure with hypoxia: Secondary | ICD-10-CM | POA: Diagnosis not present

## 2019-06-28 DIAGNOSIS — N179 Acute kidney failure, unspecified: Secondary | ICD-10-CM | POA: Diagnosis not present

## 2019-06-28 DIAGNOSIS — J9600 Acute respiratory failure, unspecified whether with hypoxia or hypercapnia: Secondary | ICD-10-CM

## 2019-06-28 DIAGNOSIS — R918 Other nonspecific abnormal finding of lung field: Secondary | ICD-10-CM | POA: Diagnosis not present

## 2019-06-28 DIAGNOSIS — J1282 Pneumonia due to coronavirus disease 2019: Secondary | ICD-10-CM

## 2019-06-28 DIAGNOSIS — E669 Obesity, unspecified: Secondary | ICD-10-CM | POA: Diagnosis present

## 2019-06-28 DIAGNOSIS — K219 Gastro-esophageal reflux disease without esophagitis: Secondary | ICD-10-CM | POA: Diagnosis not present

## 2019-06-28 DIAGNOSIS — R509 Fever, unspecified: Secondary | ICD-10-CM | POA: Diagnosis not present

## 2019-06-28 DIAGNOSIS — Z7982 Long term (current) use of aspirin: Secondary | ICD-10-CM

## 2019-06-28 DIAGNOSIS — I10 Essential (primary) hypertension: Secondary | ICD-10-CM | POA: Diagnosis not present

## 2019-06-28 DIAGNOSIS — E861 Hypovolemia: Secondary | ICD-10-CM | POA: Diagnosis present

## 2019-06-28 DIAGNOSIS — Z6832 Body mass index (BMI) 32.0-32.9, adult: Secondary | ICD-10-CM | POA: Diagnosis not present

## 2019-06-28 DIAGNOSIS — U071 COVID-19: Secondary | ICD-10-CM | POA: Diagnosis not present

## 2019-06-28 DIAGNOSIS — J189 Pneumonia, unspecified organism: Secondary | ICD-10-CM | POA: Diagnosis not present

## 2019-06-28 DIAGNOSIS — Z8249 Family history of ischemic heart disease and other diseases of the circulatory system: Secondary | ICD-10-CM

## 2019-06-28 DIAGNOSIS — R0602 Shortness of breath: Secondary | ICD-10-CM | POA: Diagnosis not present

## 2019-06-28 DIAGNOSIS — R197 Diarrhea, unspecified: Secondary | ICD-10-CM | POA: Diagnosis present

## 2019-06-28 DIAGNOSIS — E559 Vitamin D deficiency, unspecified: Secondary | ICD-10-CM | POA: Diagnosis not present

## 2019-06-28 DIAGNOSIS — G43909 Migraine, unspecified, not intractable, without status migrainosus: Secondary | ICD-10-CM | POA: Diagnosis not present

## 2019-06-28 DIAGNOSIS — Z8774 Personal history of (corrected) congenital malformations of heart and circulatory system: Secondary | ICD-10-CM

## 2019-06-28 DIAGNOSIS — R05 Cough: Secondary | ICD-10-CM | POA: Diagnosis not present

## 2019-06-28 DIAGNOSIS — J1289 Other viral pneumonia: Secondary | ICD-10-CM | POA: Diagnosis not present

## 2019-06-28 LAB — CBC WITH DIFFERENTIAL/PLATELET
Abs Immature Granulocytes: 0.02 10*3/uL (ref 0.00–0.07)
Basophils Absolute: 0 10*3/uL (ref 0.0–0.1)
Basophils Relative: 1 %
Eosinophils Absolute: 0 10*3/uL (ref 0.0–0.5)
Eosinophils Relative: 0 %
HCT: 39.1 % (ref 36.0–46.0)
Hemoglobin: 12.5 g/dL (ref 12.0–15.0)
Immature Granulocytes: 1 %
Lymphocytes Relative: 32 %
Lymphs Abs: 1.3 10*3/uL (ref 0.7–4.0)
MCH: 28.8 pg (ref 26.0–34.0)
MCHC: 32 g/dL (ref 30.0–36.0)
MCV: 90.1 fL (ref 80.0–100.0)
Monocytes Absolute: 0.6 10*3/uL (ref 0.1–1.0)
Monocytes Relative: 14 %
Neutro Abs: 2.2 10*3/uL (ref 1.7–7.7)
Neutrophils Relative %: 52 %
Platelets: 213 10*3/uL (ref 150–400)
RBC: 4.34 MIL/uL (ref 3.87–5.11)
RDW: 13.2 % (ref 11.5–15.5)
WBC: 4.1 10*3/uL (ref 4.0–10.5)
nRBC: 0 % (ref 0.0–0.2)

## 2019-06-28 LAB — FIBRINOGEN: Fibrinogen: 425 mg/dL (ref 210–475)

## 2019-06-28 LAB — COMPREHENSIVE METABOLIC PANEL
ALT: 29 U/L (ref 0–44)
AST: 42 U/L — ABNORMAL HIGH (ref 15–41)
Albumin: 4 g/dL (ref 3.5–5.0)
Alkaline Phosphatase: 41 U/L (ref 38–126)
Anion gap: 12 (ref 5–15)
BUN: 37 mg/dL — ABNORMAL HIGH (ref 8–23)
CO2: 24 mmol/L (ref 22–32)
Calcium: 8.8 mg/dL — ABNORMAL LOW (ref 8.9–10.3)
Chloride: 98 mmol/L (ref 98–111)
Creatinine, Ser: 1.62 mg/dL — ABNORMAL HIGH (ref 0.44–1.00)
GFR calc Af Amer: 36 mL/min — ABNORMAL LOW (ref >60)
GFR calc non Af Amer: 31 mL/min — ABNORMAL LOW (ref >60)
Glucose, Bld: 89 mg/dL (ref 70–99)
Potassium: 3.8 mmol/L (ref 3.5–5.1)
Sodium: 134 mmol/L — ABNORMAL LOW (ref 135–145)
Total Bilirubin: 0.6 mg/dL (ref 0.3–1.2)
Total Protein: 7.6 g/dL (ref 6.5–8.1)

## 2019-06-28 LAB — D-DIMER, QUANTITATIVE: D-Dimer, Quant: 0.88 ug/mL-FEU — ABNORMAL HIGH (ref 0.00–0.50)

## 2019-06-28 LAB — FERRITIN: Ferritin: 464 ng/mL — ABNORMAL HIGH (ref 11–307)

## 2019-06-28 LAB — TRIGLYCERIDES: Triglycerides: 92 mg/dL (ref <150)

## 2019-06-28 LAB — LACTATE DEHYDROGENASE: LDH: 230 U/L — ABNORMAL HIGH (ref 98–192)

## 2019-06-28 LAB — LACTIC ACID, PLASMA: Lactic Acid, Venous: 0.9 mmol/L (ref 0.5–1.9)

## 2019-06-28 LAB — C-REACTIVE PROTEIN: CRP: 1.7 mg/dL — ABNORMAL HIGH (ref <1.0)

## 2019-06-28 LAB — PROCALCITONIN: Procalcitonin: <0.1 ng/mL

## 2019-06-28 MED ORDER — ACETAMINOPHEN 325 MG PO TABS
650.0000 mg | ORAL_TABLET | Freq: Once | ORAL | Status: AC
  Administered 2019-06-28: 650 mg via ORAL
  Filled 2019-06-28: qty 2

## 2019-06-28 NOTE — ED Provider Notes (Signed)
[facility] DEPT Provider Note   CSN: [id number] Arrival date & time: 06/28/19  1717     History   Chief Complaint Chief Complaint  Patient presents with  . COVID +    HPI Cynthia Vasquez is a 73 y.o. female.     HPI Patient presents with shortness of breath fever fatigue and Covid infection.  Presents with husband who also tested positive and is being seen in the ER.  Worsening over the last few days with shortness of breath.  States her doctor told her to come in the hospital day.  Oxygen saturations at home went down to 88%.  Feels bad.  Has had a cough with minimal production.  No abdominal pain.  Does not have previous lung problems. Past Medical History:  Diagnosis Date  . Atrial septal defect    repaired age 50  . GERD (gastroesophageal reflux disease)   . Headache    migraines until age 72- 60, have them 3-4 x year  . Hypertension   . Vitamin D deficiency     Patient Active Problem List   Diagnosis Date Noted  . Vertigo 06/11/2013  . Hypertension   . GERD (gastroesophageal reflux disease)     Past Surgical History:  Procedure Laterality Date  . ASD REPAIR     age 58     OB History   No obstetric history on file.      Home Medications    Prior to Admission medications   Medication Sig Start Date End Date Taking? Authorizing Provider  acetaminophen (TYLENOL) 500 MG tablet Take 500 mg by mouth every 6 (six) hours as needed.    [provider]  aspirin EC 81 MG tablet Take 81 mg by mouth daily.    [provider]  calcium citrate-vitamin D (CITRACAL+D) 315-200 MG-UNIT per tablet Take 1 tablet by mouth daily.    [provider]  cholecalciferol (VITAMIN D) 1000 UNITS tablet Take 1,000 Units by mouth daily.    [provider]  diazepam (VALIUM) 2 MG tablet Take 1 tablet (2 mg total) by mouth every 12 (twelve) hours as needed for anxiety (dizziness). 06/12/13   Thurnell Lose, MD   hydrochlorothiazide (HYDRODIURIL) 25 MG tablet Take 25 mg by mouth daily.    [provider]  losartan (COZAAR) 50 MG tablet Take 50 mg by mouth daily.    [provider]  Magnesium 300 MG CAPS Take 1 capsule by mouth daily.    [provider]  meclizine (ANTIVERT) 50 MG tablet Take 1 tablet (50 mg total) by mouth 3 (three) times daily as needed for dizziness. 06/12/13   Thurnell Lose, MD  Multiple Vitamins-Minerals (MULTIVITAMIN WITH MINERALS) tablet Take 1 tablet by mouth daily.    [provider]  Omega-3 Fatty Acids (FISH OIL) 1000 MG CAPS Take 1 capsule by mouth daily.    [provider]  pantoprazole (PROTONIX) 40 MG tablet Take 40 mg by mouth daily.    [provider]    Family History Family History  Problem Relation Age of Onset  . Dementia Mother   . Heart attack Father   . Hypertension Father     Social History Social History   Tobacco Use  . Smoking status: Never Smoker  Substance Use Topics  . Alcohol use: No  . Drug use: No     Allergies   Patient has no known allergies.   Review of Systems Review of  Systems  Constitutional: Positive for fever. Negative for appetite change.  HENT: Negative for congestion.   Respiratory: Positive for cough and shortness of breath.   Cardiovascular: Negative for chest pain.  Gastrointestinal: Negative for abdominal pain.  Genitourinary: Negative for dysuria.  Musculoskeletal: Negative for back pain.  Skin: Negative for rash.  Neurological: Positive for weakness.     Physical Exam Updated Vital Signs BP (!) 144/64 (BP Location: Left Arm)   Pulse 75   Temp 98.9 F (37.2 C) (Oral)   Resp 18   Ht 5' (1.524 m)   Wt 69.4 kg   SpO2 94%   BMI 29.88 kg/m   Physical Exam Vitals signs and nursing note reviewed.  HENT:     Head: Normocephalic.  Eyes:     Pupils: Pupils are equal, round, and reactive to light.  Neck:     Musculoskeletal: Neck supple.   Cardiovascular:     Rate and Rhythm: Regular rhythm.  Pulmonary:     Comments: Mildly harsh breath sound diffusely.  Somewhat dyspneic and tachypneic at rest. Abdominal:     Tenderness: There is no abdominal tenderness.  Musculoskeletal:     Right lower leg: No edema.     Left lower leg: No edema.  Skin:    General: Skin is warm.     Capillary Refill: Capillary refill takes less than 2 seconds.  Neurological:     Mental Status: She is alert. Mental status is at baseline.      ED Treatments / Results  Labs (all labs ordered are listed, but only abnormal results are displayed) Labs Reviewed  COMPREHENSIVE METABOLIC PANEL - Abnormal; Notable for the following components:      Result Value   Sodium 134 (*)    BUN 37 (*)    Creatinine, Ser 1.62 (*)    Calcium 8.8 (*)    AST 42 (*)    GFR calc non Af Amer 31 (*)    GFR calc Af Amer 36 (*)    All other components within normal limits  D-DIMER, QUANTITATIVE (NOT AT [facility]) - Abnormal; Notable for the following components:   D-Dimer, Quant 0.88 (*)    All other components within normal limits  LACTATE DEHYDROGENASE - Abnormal; Notable for the following components:   LDH 230 (*)    All other components within normal limits  FERRITIN - Abnormal; Notable for the following components:   Ferritin 464 (*)    All other components within normal limits  C-REACTIVE PROTEIN - Abnormal; Notable for the following components:   CRP 1.7 (*)    All other components within normal limits  CULTURE, BLOOD (ROUTINE X 2)  CULTURE, BLOOD (ROUTINE X 2)  CBC WITH DIFFERENTIAL/PLATELET  LACTIC ACID, PLASMA  PROCALCITONIN  TRIGLYCERIDES  FIBRINOGEN    EKG None  Radiology Dg Chest Port 1 View  Result Date: 06/28/2019 CLINICAL DATA:  Shortness of breath, cough, fever. COVID-19 positive. EXAM: PORTABLE CHEST 1 VIEW COMPARISON:  04/13/2011 FINDINGS: The cardiomediastinal contours are normal. Bilateral streaky subpleural opacities in both lungs,  left greater than right. Pulmonary vasculature is normal. No pleural effusion or pneumothorax. No acute osseous abnormalities are seen. IMPRESSION: Streaky opacities in both lungs, left greater than right, pattern consistent with COVID-19 pneumonia. Electronically Signed   By: Keith Rake M.D.   On: 06/28/2019 19:18    Procedures Procedures (including critical care time)  Medications Ordered in ED Medications  acetaminophen (TYLENOL) tablet 650 mg (650 mg Oral Given 06/28/19  2147)     Initial Impression / Assessment and Plan / ED Course  I have reviewed the triage vital signs and the nursing notes.  Pertinent labs & imaging results that were available during my care of the patient were reviewed by me and considered in my medical decision making (see chart for details).        Patient presents with worsening shortness of breath with known Covid.  Her husband is here with similar symptoms.  X-ray shows Covid infiltrates.  Creatinine mildly increased.  Unsure of baseline as her last creatinine here was 6 years ago.  Room air sats here were around 91% but dyspneic with it.  Much better on nasal cannula oxygen.  With potential acute kidney injury and oxygen requirement feels the patient would benefit from mission to the hospital.  Her husband is also going to be admitted.  Will discuss with hospitalist.  Final Clinical Impressions(s) / ED Diagnoses   Final diagnoses:  Pneumonia due to COVID-19 virus    ED Discharge Orders    None       Davonna Belling, MD 06/28/19 2210

## 2019-06-28 NOTE — ED Notes (Signed)
Dr.Pickering notified of pt having pain back and head.

## 2019-06-28 NOTE — ED Notes (Signed)
Trial of pt without oxygen while assessing vital signs showed a rapid decrease from 94% on 2L to 86% on Room air. Trial stopped and pt placed on 3L and oxygen saturation increased back to 96%.

## 2019-06-28 NOTE — ED Triage Notes (Signed)
Patient c/o SOB, cough, and fever. Pt tested positive for covid on Friday. Pt stated that her husband is in ED room 2 also tested positive with covid.

## 2019-06-29 ENCOUNTER — Encounter (HOSPITAL_COMMUNITY): Payer: Self-pay | Admitting: Internal Medicine

## 2019-06-29 DIAGNOSIS — U071 COVID-19: Principal | ICD-10-CM

## 2019-06-29 DIAGNOSIS — I10 Essential (primary) hypertension: Secondary | ICD-10-CM

## 2019-06-29 DIAGNOSIS — J9601 Acute respiratory failure with hypoxia: Secondary | ICD-10-CM | POA: Diagnosis present

## 2019-06-29 DIAGNOSIS — J9600 Acute respiratory failure, unspecified whether with hypoxia or hypercapnia: Secondary | ICD-10-CM

## 2019-06-29 LAB — CREATININE, SERUM
Creatinine, Ser: 1.88 mg/dL — ABNORMAL HIGH (ref 0.44–1.00)
GFR calc Af Amer: 30 mL/min — ABNORMAL LOW (ref >60)
GFR calc non Af Amer: 26 mL/min — ABNORMAL LOW (ref >60)

## 2019-06-29 LAB — CBC
HCT: 37.9 % (ref 36.0–46.0)
Hemoglobin: 12.5 g/dL (ref 12.0–15.0)
MCH: 29.6 pg (ref 26.0–34.0)
MCHC: 33 g/dL (ref 30.0–36.0)
MCV: 89.6 fL (ref 80.0–100.0)
Platelets: 198 10*3/uL (ref 150–400)
RBC: 4.23 MIL/uL (ref 3.87–5.11)
RDW: 13.2 % (ref 11.5–15.5)
WBC: 4.6 10*3/uL (ref 4.0–10.5)
nRBC: 0 % (ref 0.0–0.2)

## 2019-06-29 LAB — ABO/RH: ABO/RH(D): A POS

## 2019-06-29 MED ORDER — SODIUM CHLORIDE 0.9 % IV SOLN: 100.0000 mg | Freq: Every day | INTRAVENOUS | Status: DC

## 2019-06-29 MED ORDER — ENOXAPARIN SODIUM 30 MG/0.3ML ~~LOC~~ SOLN: 30.0000 mg | Freq: Every day | SUBCUTANEOUS | Status: DC

## 2019-06-29 MED ORDER — PANTOPRAZOLE SODIUM 40 MG PO TBEC
40.0000 mg | DELAYED_RELEASE_TABLET | Freq: Every day | ORAL | Status: DC
  Administered 2019-06-29 – 2019-07-04 (×6): 40 mg via ORAL
  Filled 2019-06-29 (×6): qty 1

## 2019-06-29 MED ORDER — ASPIRIN EC 81 MG PO TBEC
81.0000 mg | DELAYED_RELEASE_TABLET | Freq: Every day | ORAL | Status: DC
  Administered 2019-06-29 – 2019-07-04 (×6): 81 mg via ORAL
  Filled 2019-06-29 (×6): qty 1

## 2019-06-29 MED ORDER — ACETAMINOPHEN 650 MG RE SUPP: 650.0000 mg | Freq: Four times a day (QID) | RECTAL | Status: DC | PRN

## 2019-06-29 MED ORDER — ACETAMINOPHEN 325 MG PO TABS
650.0000 mg | ORAL_TABLET | Freq: Four times a day (QID) | ORAL | Status: DC | PRN
  Administered 2019-06-29 – 2019-07-01 (×5): 650 mg via ORAL
  Filled 2019-06-29 (×5): qty 2

## 2019-06-29 MED ORDER — DEXAMETHASONE SODIUM PHOSPHATE 10 MG/ML IJ SOLN
6.0000 mg | Freq: Every day | INTRAMUSCULAR | Status: DC
  Administered 2019-06-29 – 2019-07-04 (×6): 6 mg via INTRAVENOUS
  Filled 2019-06-29 (×7): qty 1

## 2019-06-29 MED ORDER — OMEGA-3-ACID ETHYL ESTERS 1 G PO CAPS
1.0000 g | ORAL_CAPSULE | Freq: Every day | ORAL | Status: DC
  Administered 2019-06-29 – 2019-07-04 (×5): 1 g via ORAL
  Filled 2019-06-29 (×7): qty 1

## 2019-06-29 MED ORDER — HYDROCHLOROTHIAZIDE 25 MG PO TABS: 25.0000 mg | ORAL_TABLET | Freq: Every day | ORAL | Status: DC

## 2019-06-29 MED ORDER — LOSARTAN POTASSIUM 50 MG PO TABS: 50.0000 mg | ORAL_TABLET | Freq: Every day | ORAL | Status: DC

## 2019-06-29 MED ORDER — VITAMIN D3 25 MCG PO TABS
1000.0000 [IU] | ORAL_TABLET | Freq: Every day | ORAL | Status: DC
  Administered 2019-06-29 – 2019-07-04 (×6): 1000 [IU] via ORAL
  Filled 2019-06-29 (×12): qty 1

## 2019-06-29 MED ORDER — SODIUM CHLORIDE 0.9 % IV SOLN
100.0000 mg | Freq: Every day | INTRAVENOUS | Status: AC
  Administered 2019-06-29 – 2019-07-02 (×4): 100 mg via INTRAVENOUS
  Filled 2019-06-29 (×4): qty 100

## 2019-06-29 MED ORDER — ADULT MULTIVITAMIN W/MINERALS CH
1.0000 | ORAL_TABLET | Freq: Every day | ORAL | Status: DC
  Administered 2019-06-29 – 2019-07-04 (×5): 1 via ORAL
  Filled 2019-06-29 (×6): qty 1

## 2019-06-29 MED ORDER — ONDANSETRON HCL 4 MG/2ML IJ SOLN
4.0000 mg | Freq: Four times a day (QID) | INTRAMUSCULAR | Status: DC | PRN
  Administered 2019-06-30: 4 mg via INTRAVENOUS
  Filled 2019-06-29: qty 2

## 2019-06-29 MED ORDER — HYDRALAZINE HCL 20 MG/ML IJ SOLN
10.0000 mg | INTRAMUSCULAR | Status: DC | PRN
  Filled 2019-06-29: qty 0.5

## 2019-06-29 MED ORDER — ONDANSETRON HCL 4 MG PO TABS: 4.0000 mg | ORAL_TABLET | Freq: Four times a day (QID) | ORAL | Status: DC | PRN

## 2019-06-29 MED ORDER — ENSURE ENLIVE PO LIQD
237.0000 mL | Freq: Two times a day (BID) | ORAL | Status: DC
  Administered 2019-07-03 (×2): 237 mL via ORAL

## 2019-06-29 MED ORDER — SODIUM CHLORIDE 0.9 % IV SOLN
200.0000 mg | Freq: Once | INTRAVENOUS | Status: AC
  Administered 2019-06-29: 200 mg via INTRAVENOUS
  Filled 2019-06-29: qty 40

## 2019-06-29 MED ORDER — ENOXAPARIN SODIUM 40 MG/0.4ML ~~LOC~~ SOLN
40.0000 mg | Freq: Every day | SUBCUTANEOUS | Status: DC
  Administered 2019-06-29: 40 mg via SUBCUTANEOUS
  Filled 2019-06-29 (×2): qty 0.4

## 2019-06-29 NOTE — Progress Notes (Signed)
PROGRESS NOTE                                                                                                                                                                                                             Patient Demographics:    Cynthia Vasquez, is a 73 y.o. female, DOB - [birth year], [id number]  Admit date - 06/28/2019   Admitting Physician No admitting provider for patient encounter.  Outpatient Primary MD for the patient is Leighton Ruff, MD  LOS - 1    Chief Complaint  Patient presents with  . COVID +       Brief Narrative   73 year old female with hypertension, history of ASD (repaired at age 62) presenting with shortness of breath for past 4-5 days associated with body ache and diarrhea.  She reports symptoms occurring after recent get together where her family members were diagnosed with COVID-19.  She was tested positive for COVID-19 on 11/20.  She had mild symptoms initially which progressed to increasing shortness of breath with weakness and diarrhea. In the ED she was hypoxic requiring 4 L O2, elevated inflammatory markers including LDH, ferritin, CRP but with normal procalcitonin.  CBC and chemistry were normal. Patient tested positive for COVID-19 infection.  Chest x-ray showed bilateral infiltrate. Patient started on IV Decadron and IV remdesivir and plan to be transferred to [facility] for further care.   Subjective:   Denies any shortness of breath at rest, currently on 4 L.  Reports some nonproductive cough but no chest pain.  Assessment  & Plan :    Principal Problem:   Acute respiratory failure due to COVID-19 [facility]) Still requiring 4 L via nasal cannula, sats in the low 90s.  Started on Decadron 6 mg IV daily along with daily IV remdesivir.  Monitor daily inflammatory markers. Plan to be transferred to [facility] for further care.   Active  Problems: Essential hypertension Stable.  Not on any meds currently    GERD (gastroesophageal reflux disease) Continue PPI      Code Status : Full code  Family Communication  : None  Disposition Plan  : Transfer to [facility]  Barriers For Discharge : Active symptoms  Consults  : None  Procedures  : None  DVT Prophylaxis  :  Lovenox   Lab Results  Component Value Date   PLT 198 06/29/2019    Antibiotics  :    Anti-infectives (From admission, onward)   Start     Dose/Rate Route Frequency Ordered Stop   06/30/19 0800  remdesivir 100 mg in sodium chloride 0.9 % 250 mL IVPB     100 mg 500 mL/hr over 30 Minutes Intravenous Daily 06/29/19 0209 07/04/19 0959   06/29/19 0300  remdesivir 200 mg in sodium chloride 0.9 % 250 mL IVPB     200 mg 500 mL/hr over 30 Minutes Intravenous Once 06/29/19 0208 06/29/19 0419        Objective:   Vitals:   06/29/19 0630 06/29/19 0700 06/29/19 0730 06/29/19 0800  BP: 133/69 132/63 123/63 138/81  Pulse: 71 69 75 82  Resp: 15 16 14 19   Temp:      TempSrc:      SpO2: 98% 98% 96% 93%  Weight:      Height:        Wt Readings from Last 3 Encounters:  06/28/19 69.4 kg  03/08/15 77.5 kg  06/11/13 80.6 kg     Intake/Output Summary (Last 24 hours) at 06/29/2019 0915 Last data filed at 06/29/2019 0421 Gross per 24 hour  Intake 252.91 ml  Output -  Net 252.91 ml     Physical Exam  Gen: Elderly female no acute distress, fatigue HEENT:moist mucosa, supple neck Chest: Coarse bibasilar crackles CVS: N S1&S2, no murmurs, rubs or gallop GI: soft, NT, ND Musculoskeletal: warm, no edema     Data Review:    CBC Recent Labs  Lab 06/28/19 1959 06/29/19 0222  WBC 4.1 4.6  HGB 12.5 12.5  HCT 39.1 37.9  PLT 213 198  MCV 90.1 89.6  MCH 28.8 29.6  MCHC 32.0 33.0  RDW 13.2 13.2  LYMPHSABS 1.3  --   MONOABS 0.6  --   EOSABS 0.0  --   BASOSABS 0.0  --     Chemistries  Recent Labs  Lab 06/28/19 1959  06/29/19 0222  NA 134*  --   K 3.8  --   CL 98  --   CO2 24  --   GLUCOSE 89  --   BUN 37*  --   CREATININE 1.62* 1.88*  CALCIUM 8.8*  --   AST 42*  --   ALT 29  --   ALKPHOS 41  --   BILITOT 0.6  --    ------------------------------------------------------------------------------------------------------------------ Recent Labs    06/28/19 1959  TRIG 92    No results found for: HGBA1C ------------------------------------------------------------------------------------------------------------------ No results for input(s): TSH, T4TOTAL, T3FREE, THYROIDAB in the last 72 hours.  Invalid input(s): FREET3 ------------------------------------------------------------------------------------------------------------------ Recent Labs    06/28/19 1959  FERRITIN 464*    Coagulation profile No results for input(s): INR, PROTIME in the last 168 hours.  Recent Labs    06/28/19 1959  DDIMER 0.88*    Cardiac Enzymes No results for input(s): CKMB, TROPONINI, MYOGLOBIN in the last 168 hours.  Invalid input(s): CK ------------------------------------------------------------------------------------------------------------------ No results found for: BNP  Inpatient Medications  Scheduled Meds: . aspirin EC  81 mg Oral Daily  . dexamethasone (DECADRON) injection  6 mg Intravenous Daily  . enoxaparin (LOVENOX) injection  40 mg Subcutaneous Daily  . multivitamin with minerals  1 tablet Oral Daily  . omega-3 acid ethyl esters  1 g Oral Daily  . pantoprazole  40 mg Oral Daily  . cholecalciferol  1,000 Units Oral Daily   Continuous Infusions: . [START ON  06/30/2019] remdesivir 100 mg in NS 250 mL     PRN Meds:.acetaminophen **OR** acetaminophen, hydrALAZINE, ondansetron **OR** ondansetron (ZOFRAN) IV  Micro Results Recent Results (from the past 240 hour(s))  Novel Coronavirus, NAA ([facility])     Status: Abnormal   Collection Time: 06/23/19 12:00 AM   Specimen:  Nasopharyngeal(NP) swabs in vial transport medium   NASOPHARYNGE  TESTING  Result Value Ref Range Status   SARS-CoV-2, NAA Detected (A) Not Detected Final    Comment: This nucleic acid amplification test was developed and its performance characteristics determined by [employer]. Nucleic acid amplification tests include PCR and TMA. This test has not been FDA cleared or approved. This test has been authorized by FDA under an Emergency Use Authorization (EUA). This test is only authorized for the duration of time the declaration that circumstances exist justifying the authorization of the emergency use of in vitro diagnostic tests for detection of SARS-CoV-2 virus and/or diagnosis of COVID-19 infection under section 564(b)(1) of the Act, 21 U.S.C. [id number]) (1), unless the authorization is terminated or revoked sooner. When diagnostic testing is negative, the possibility of a false negative result should be considered in the context of a patient's recent exposures and the presence of clinical signs and symptoms consistent with COVID-19. An individual without symptoms of COVID-19 and who is not shedding SARS-CoV-2 virus would  expect to have a negative (not detected) result in this assay.     Radiology Reports Dg Chest Port 1 View  Result Date: 06/28/2019 CLINICAL DATA:  Shortness of breath, cough, fever. COVID-19 positive. EXAM: PORTABLE CHEST 1 VIEW COMPARISON:  04/13/2011 FINDINGS: The cardiomediastinal contours are normal. Bilateral streaky subpleural opacities in both lungs, left greater than right. Pulmonary vasculature is normal. No pleural effusion or pneumothorax. No acute osseous abnormalities are seen. IMPRESSION: Streaky opacities in both lungs, left greater than right, pattern consistent with COVID-19 pneumonia. Electronically Signed   By: Keith Rake M.D.   On: 06/28/2019 19:18    Time Spent in minutes  25   Latiesha Harada M.D on 06/29/2019 at 9:15  AM  Between 7am to 7pm - Pager - [phone]  After 7pm go to [email or web address] - password [facility]  [facility] -  Office  [phone]

## 2019-06-29 NOTE — H&P (Signed)
History and Physical    HAISLEY FISCAL [id number] DOB: 02-Jun-1946 DOA: 06/28/2019  PCP: Leighton Ruff, MD  Patient coming from: Home.  Chief Complaint: Shortness of breath.  HPI: Cynthia Vasquez is a 73 y.o. female with history of atrial septal defect status post repair at age 22, hypertension has been experiencing increasing shortness of breath over the last few days.  Patient initially had numerous body ache with diarrhea and respiratory symptoms after a recent get together wherein patient's family members were diagnosed with COVID-19.  On November 20 patient was tested positive for Covid.  Following which patient has become more symptomatic with shortness of breath diarrhea weakness.  Given the progressive worsening symptoms patient comes to the ER.  Denies any chest pain or vomiting but has nausea and poor appetite.  ED Course: In the ER patient afebrile but hypoxic requiring 4 L oxygen to maintain sats chest x-ray shows bilateral infiltrates.  Labs show creatinine of 1.6 increased from baseline LDH 230 ferritin 464 CRP 1.7 procalcitonin less than 0.1 hemoglobin 12.5 WBC 4.1 platelets 238.  EKG shows sinus arrhythmia.  Given hypoxic respiratory failure with COVID-19 infection will start patient on remdesivir IV Decadron and admit.  Review of Systems: As per HPI, rest all negative.   Past Medical History:  Diagnosis Date  . Atrial septal defect    repaired age 46  . GERD (gastroesophageal reflux disease)   . Headache    migraines until age 62- 2, have them 3-4 x year  . Hypertension   . Vitamin D deficiency     Past Surgical History:  Procedure Laterality Date  . ASD REPAIR     age 21     reports that she has never smoked. She has never used smokeless tobacco. She reports that she does not drink alcohol or use drugs.  No Known Allergies  Family History  Problem Relation Age of Onset  . Dementia Mother   . Heart attack Father   . Hypertension Father     Prior  to Admission medications   Medication Sig Start Date End Date Taking? Authorizing Provider  acetaminophen (TYLENOL) 500 MG tablet Take 500 mg by mouth every 6 (six) hours as needed.    [provider]  aspirin EC 81 MG tablet Take 81 mg by mouth daily.    [provider]  calcium citrate-vitamin D (CITRACAL+D) 315-200 MG-UNIT per tablet Take 1 tablet by mouth daily.    [provider]  cholecalciferol (VITAMIN D) 1000 UNITS tablet Take 1,000 Units by mouth daily.    [provider]  diazepam (VALIUM) 2 MG tablet Take 1 tablet (2 mg total) by mouth every 12 (twelve) hours as needed for anxiety (dizziness). 06/12/13   Thurnell Lose, MD  hydrochlorothiazide (HYDRODIURIL) 25 MG tablet Take 25 mg by mouth daily.    [provider]  losartan (COZAAR) 50 MG tablet Take 50 mg by mouth daily.    [provider]  Magnesium 300 MG CAPS Take 1 capsule by mouth daily.    [provider]  meclizine (ANTIVERT) 50 MG tablet Take 1 tablet (50 mg total) by mouth 3 (three) times daily as needed for dizziness. 06/12/13   Thurnell Lose, MD  Multiple Vitamins-Minerals (MULTIVITAMIN WITH MINERALS) tablet Take 1 tablet by mouth daily.    [provider]  Omega-3 Fatty Acids (FISH OIL) 1000 MG CAPS Take 1 capsule by mouth daily.    [provider]  pantoprazole (  PROTONIX) 40 MG tablet Take 40 mg by mouth daily.    [provider]    Physical Exam: Constitutional: Moderately built and nourished. Vitals:   06/29/19 0000 06/29/19 0030 06/29/19 0055 06/29/19 0100  BP: (!) 115/52 (!) 106/55  (!) 109/54  Pulse: 74 66  71  Resp: 20 17  14   Temp:      TempSrc:      SpO2: 93% 96% 91% 96%  Weight:      Height:       Eyes: Anicteric no pallor. ENMT: No discharge from the ears eyes nose or mouth. Neck: No mass felt.  No neck rigidity. Respiratory: No rhonchi or crepitations. Cardiovascular: S1-S2 heard. Abdomen: Soft  nontender bowel sounds present. Musculoskeletal: No edema.  No joint effusion. Skin: No rash. Neurologic: Alert awake oriented to time place and person.  Moves all extremities. Psychiatric: Appears normal.   Labs on Admission: I have personally reviewed following labs and imaging studies  CBC: Recent Labs  Lab 06/28/19 1959  WBC 4.1  NEUTROABS 2.2  HGB 12.5  HCT 39.1  MCV 90.1  PLT 123456   Basic Metabolic Panel: Recent Labs  Lab 06/28/19 1959  NA 134*  K 3.8  CL 98  CO2 24  GLUCOSE 89  BUN 37*  CREATININE 1.62*  CALCIUM 8.8*   GFR: Estimated Creatinine Clearance: 26.9 mL/min (A) (by C-G formula based on SCr of 1.62 mg/dL (H)). Liver Function Tests: Recent Labs  Lab 06/28/19 1959  AST 42*  ALT 29  ALKPHOS 41  BILITOT 0.6  PROT 7.6  ALBUMIN 4.0   No results for input(s): LIPASE, AMYLASE in the last 168 hours. No results for input(s): AMMONIA in the last 168 hours. Coagulation Profile: No results for input(s): INR, PROTIME in the last 168 hours. Cardiac Enzymes: No results for input(s): CKTOTAL, CKMB, CKMBINDEX, TROPONINI in the last 168 hours. BNP (last 3 results) No results for input(s): PROBNP in the last 8760 hours. HbA1C: No results for input(s): HGBA1C in the last 72 hours. CBG: No results for input(s): GLUCAP in the last 168 hours. Lipid Profile: Recent Labs    06/28/19 1959  TRIG 92   Thyroid Function Tests: No results for input(s): TSH, T4TOTAL, FREET4, T3FREE, THYROIDAB in the last 72 hours. Anemia Panel: Recent Labs    06/28/19 1959  FERRITIN 464*   Urine analysis:    Component Value Date/Time   COLORURINE YELLOW 06/11/2013 2007   APPEARANCEUR CLEAR 06/11/2013 2007   LABSPEC 1.017 06/11/2013 2007   PHURINE 6.5 06/11/2013 2007   GLUCOSEU NEGATIVE 06/11/2013 2007   HGBUR NEGATIVE 06/11/2013 2007   BILIRUBINUR NEGATIVE 06/11/2013 2007   KETONESUR NEGATIVE 06/11/2013 2007   PROTEINUR NEGATIVE 06/11/2013 2007   UROBILINOGEN 0.2  06/11/2013 2007   NITRITE NEGATIVE 06/11/2013 2007   LEUKOCYTESUR NEGATIVE 06/11/2013 2007   Sepsis Labs: @LABRCNTIP (procalcitonin:4,lacticidven:4) ) Recent Results (from the past 240 hour(s))  Novel Coronavirus, NAA ([facility])     Status: Abnormal   Collection Time: 06/23/19 12:00 AM   Specimen: Nasopharyngeal(NP) swabs in vial transport medium   NASOPHARYNGE  TESTING  Result Value Ref Range Status   SARS-CoV-2, NAA Detected (A) Not Detected Final    Comment: This nucleic acid amplification test was developed and its performance characteristics determined by [employer]. Nucleic acid amplification tests include PCR and TMA. This test has not been FDA cleared or approved. This test has been authorized by FDA under an Emergency Use Authorization (EUA). This test is only  authorized for the duration of time the declaration that circumstances exist justifying the authorization of the emergency use of in vitro diagnostic tests for detection of SARS-CoV-2 virus and/or diagnosis of COVID-19 infection under section 564(b)(1) of the Act, 21 U.S.C. [id number]) (1), unless the authorization is terminated or revoked sooner. When diagnostic testing is negative, the possibility of a false negative result should be considered in the context of a patient's recent exposures and the presence of clinical signs and symptoms consistent with COVID-19. An individual without symptoms of COVID-19 and who is not shedding SARS-CoV-2 virus would  expect to have a negative (not detected) result in this assay.      Radiological Exams on Admission: Dg Chest Port 1 View  Result Date: 06/28/2019 CLINICAL DATA:  Shortness of breath, cough, fever. COVID-19 positive. EXAM: PORTABLE CHEST 1 VIEW COMPARISON:  04/13/2011 FINDINGS: The cardiomediastinal contours are normal. Bilateral streaky subpleural opacities in both lungs, left greater than right. Pulmonary vasculature is normal. No pleural effusion or  pneumothorax. No acute osseous abnormalities are seen. IMPRESSION: Streaky opacities in both lungs, left greater than right, pattern consistent with COVID-19 pneumonia. Electronically Signed   By: Keith Rake M.D.   On: 06/28/2019 19:18    EKG: Independently reviewed.  Sinus arrhythmia.  Assessment/Plan Principal Problem:   Acute respiratory failure due to COVID-19 [facility]) Active Problems:   Hypertension   GERD (gastroesophageal reflux disease)   Acute respiratory failure with hypoxia (HCC)    1. Acute respiratory failure with hypoxia secondary to Covid 19 infection for which I have started patient on remdesivir and IV Decadron.  Closely monitor respiratory status and inflammatory markers. 2. Acute renal failure likely from diarrhea associated with Covid will hold off ARB and hydrochlorothiazide.  If there is any further worsening of creatinine may need hydration. 3. Hypertension holding ARB and hydrochlorothiazide due to renal failure and we will keep patient on as needed IV hydralazine for now.  Given the respiratory failure status with Covid will need more than 2 midnight stay and inpatient status.   DVT prophylaxis: Lovenox. Code Status: Full code. Family Communication: Discussed with patient. Disposition Plan: Home. Consults called: None. Admission status: Inpatient.   Rise Patience MD [facility] Pager [phone].  If 7PM-7AM, please contact night-coverage [email or web address] Password [email or web address]  06/29/2019, 1:30 AM

## 2019-06-29 NOTE — ED Notes (Signed)
Pt oxygen dropped from 94% on 3L to 90% when standing to use beside commode. Pt oxygen increased to 4L. Dr.Kakrakandy to be notified.

## 2019-06-29 NOTE — Progress Notes (Signed)
Will adjust her lovenox slightly to 30mg  SQ qday due to CrCl<30.  Onnie Boer, PharmD, BCIDP, AAHIVP, CPP Infectious Disease Pharmacist 06/29/2019 11:22 AM

## 2019-06-29 NOTE — ED Notes (Signed)
[facility] dispatch notified for need of transport.  

## 2019-06-29 NOTE — ED Notes (Signed)
[facility] present to transport patient to [place].

## 2019-06-30 DIAGNOSIS — J1289 Other viral pneumonia: Secondary | ICD-10-CM

## 2019-06-30 DIAGNOSIS — K219 Gastro-esophageal reflux disease without esophagitis: Secondary | ICD-10-CM

## 2019-06-30 DIAGNOSIS — J9601 Acute respiratory failure with hypoxia: Secondary | ICD-10-CM

## 2019-06-30 LAB — CBC
HCT: 36.7 % (ref 36.0–46.0)
Hemoglobin: 12.4 g/dL (ref 12.0–15.0)
MCH: 29.7 pg (ref 26.0–34.0)
MCHC: 33.8 g/dL (ref 30.0–36.0)
MCV: 87.8 fL (ref 80.0–100.0)
Platelets: 229 10*3/uL (ref 150–400)
RBC: 4.18 MIL/uL (ref 3.87–5.11)
RDW: 12.9 % (ref 11.5–15.5)
WBC: 4.9 10*3/uL (ref 4.0–10.5)
nRBC: 0 % (ref 0.0–0.2)

## 2019-06-30 LAB — COMPREHENSIVE METABOLIC PANEL
ALT: 27 U/L (ref 0–44)
AST: 37 U/L (ref 15–41)
Albumin: 3.6 g/dL (ref 3.5–5.0)
Alkaline Phosphatase: 42 U/L (ref 38–126)
Anion gap: 11 (ref 5–15)
BUN: 40 mg/dL — ABNORMAL HIGH (ref 8–23)
CO2: 23 mmol/L (ref 22–32)
Calcium: 8.8 mg/dL — ABNORMAL LOW (ref 8.9–10.3)
Chloride: 102 mmol/L (ref 98–111)
Creatinine, Ser: 1.31 mg/dL — ABNORMAL HIGH (ref 0.44–1.00)
GFR calc Af Amer: 47 mL/min — ABNORMAL LOW (ref >60)
GFR calc non Af Amer: 40 mL/min — ABNORMAL LOW (ref >60)
Glucose, Bld: 100 mg/dL — ABNORMAL HIGH (ref 70–99)
Potassium: 4.1 mmol/L (ref 3.5–5.1)
Sodium: 136 mmol/L (ref 135–145)
Total Bilirubin: 0.5 mg/dL (ref 0.3–1.2)
Total Protein: 7.3 g/dL (ref 6.5–8.1)

## 2019-06-30 LAB — C-REACTIVE PROTEIN: CRP: 2.3 mg/dL — ABNORMAL HIGH (ref <1.0)

## 2019-06-30 LAB — FERRITIN: Ferritin: 524 ng/mL — ABNORMAL HIGH (ref 11–307)

## 2019-06-30 LAB — D-DIMER, QUANTITATIVE: D-Dimer, Quant: 0.81 ug/mL-FEU — ABNORMAL HIGH (ref 0.00–0.50)

## 2019-06-30 LAB — MAGNESIUM: Magnesium: 2.2 mg/dL (ref 1.7–2.4)

## 2019-06-30 MED ORDER — VITAMIN C 500 MG PO TABS
500.0000 mg | ORAL_TABLET | Freq: Every day | ORAL | Status: DC
  Administered 2019-06-30 – 2019-07-04 (×5): 500 mg via ORAL
  Filled 2019-06-30 (×4): qty 1

## 2019-06-30 MED ORDER — ENOXAPARIN SODIUM 40 MG/0.4ML ~~LOC~~ SOLN
40.0000 mg | SUBCUTANEOUS | Status: DC
  Administered 2019-06-30 – 2019-07-04 (×5): 40 mg via SUBCUTANEOUS
  Filled 2019-06-30 (×5): qty 0.4

## 2019-06-30 MED ORDER — ZINC SULFATE 220 (50 ZN) MG PO CAPS
220.0000 mg | ORAL_CAPSULE | Freq: Every day | ORAL | Status: DC
  Administered 2019-06-30 – 2019-07-04 (×5): 220 mg via ORAL
  Filled 2019-06-30 (×4): qty 1

## 2019-06-30 NOTE — Progress Notes (Addendum)
PROGRESS NOTE  Cynthia Vasquez [id number] DOB: [birth year] DOA: 06/28/2019  PCP: Leighton Ruff, MD  Brief History/Interval Summary: 73 year old female with hypertension, history of ASD (repaired at age 72) presented with shortness of breath for past 4-5 days associated with body ache and diarrhea.  She reported that symptoms occurred after recent get together where her family members were diagnosed with COVID-19.  She was tested positive for COVID-19 on 11/20.    She subsequently started developing shortness of breath.  In the emergency department she was noted to be hypoxic.  She was hospitalized for further management.    Reason for Visit: Pneumonia due to COVID-19.  Acute respiratory failure with hypoxia.  Consultants: None  Procedures: None  Antibiotics: Anti-infectives (From admission, onward)   Start     Dose/Rate Route Frequency Ordered Stop   06/30/19 0800  remdesivir 100 mg in sodium chloride 0.9 % 250 mL IVPB  Status:  Discontinued     100 mg 500 mL/hr over 30 Minutes Intravenous Daily 06/29/19 0209 06/29/19 1112   06/29/19 1800  remdesivir 100 mg in sodium chloride 0.9 % 250 mL IVPB     100 mg 500 mL/hr over 30 Minutes Intravenous Daily 06/29/19 1112 07/03/19 0959   06/29/19 0300  remdesivir 200 mg in sodium chloride 0.9 % 250 mL IVPB     200 mg 500 mL/hr over 30 Minutes Intravenous Once 06/29/19 0208 06/29/19 0419      Subjective/Interval History: Patient states that he is feeling nauseated this morning.  Also has some difficulty breathing at times.  Occasional dry cough.  No diarrhea.    Assessment/Plan:  Acute Hypoxic Resp. Failure/Pneumonia due to COVID-19  COVID-19 Labs  Recent Labs    06/28/19 1959 06/30/19 0025  DDIMER 0.88* 0.81*  FERRITIN 464* 524*  LDH 230*  --   CRP 1.7* 2.3*    Lab Results  Component Value Date   SARSCOV2NAA Detected (A) 06/23/2019     Objective findings: Fever: Noted to be afebrile Oxygen requirements: 4 L/min  by nasal cannula.  Saturating in the early 90s.  COVID 19 Therapeutics: Antibacterials: None Remdesivir: Day 3 Steroids: Dexamethasone 6 mg daily Diuretics: Not on scheduled basis Actemra: Not given yet Convalescent Plasma: Not given yet Vitamin C and Zinc: Will initiate PUD Prophylaxis: On Protonix DVT Prophylaxis:  Lovenox   Patient seems to be stable from a respiratory standpoint.  Still requiring oxygen.  She is feeling nauseated this morning most likely due to the effects of the virus.  She will be given antiemetic agents.  She is on a PPI which will be continued.  CRP is 2.3 this morning.  D-dimer 0.81.  Ferritin 524.  Continue with remdesivir and steroids.  Brief discussion about Actemra and convalescent plasma although patient not have severe manifestations as yet.  Incentive spirometry, mobilization, prone positioning.  Procalcitonin was less than 0.1.  Holding off on antibacterials.  Acute renal failure Most likely due to hypovolemia.  This has improved.  Continue to monitor urine output.  Essential hypertension Stable.  On losartan and hydrochlorothiazide at home which are being held.  Monitor blood pressure.  Reasonably well controlled.  History of GERD Continue PPI.  Obesity Estimated body mass index is 31 kg/m as calculated from the following:   Height as of this encounter: 5' (1.524 m).   Weight as of this encounter: 72 kg.   DVT Prophylaxis: Lovenox Code Status: Full code Family Communication: Discussed with the patient and her husband who  was in the adjacent bed.  They request that I call their son which I will do so later today. Disposition Plan: Continue current management.  Mobilize.  Out of bed to chair.   Medications:  Scheduled: . aspirin EC  81 mg Oral Daily  . dexamethasone (DECADRON) injection  6 mg Intravenous Daily  . enoxaparin (LOVENOX) injection  40 mg Subcutaneous Q24H  . feeding supplement (ENSURE ENLIVE)  237 mL Oral BID BM  . multivitamin  with minerals  1 tablet Oral Daily  . omega-3 acid ethyl esters  1 g Oral Daily  . pantoprazole  40 mg Oral Daily  . cholecalciferol  1,000 Units Oral Daily   Continuous: . remdesivir 100 mg in NS 250 mL 100 mg (06/30/19 1014)   [id number] **OR** acetaminophen, hydrALAZINE, ondansetron **OR** ondansetron (ZOFRAN) IV   Objective:  Vital Signs  Vitals:   06/29/19 1718 06/30/19 0404 06/30/19 0500 06/30/19 0753  BP:  (!) 119/50  (!) 107/47  Pulse: 61 65  (!) 54  Resp: 14 15  16   Temp: 98.8 F (37.1 C) 98.5 F (36.9 C)  99.7 F (37.6 C)  TempSrc: Oral Oral  Oral  SpO2: 96% 96%  94%  Weight:   72 kg   Height:        Intake/Output Summary (Last 24 hours) at 06/30/2019 1021 Last data filed at 06/30/2019 0600 Gross per 24 hour  Intake 250 ml  Output -  Net 250 ml   Filed Weights   06/28/19 2048 06/30/19 0500  Weight: 69.4 kg 72 kg    General appearance: Awake alert.  In no distress.  Appears fatigued. Resp: Mildly tachypneic at rest.  Coarse breath sounds bilaterally with crackles at the bases.  No wheezing or rhonchi. Cardio: S1-S2 is normal regular.  No S3-S4.  No rubs murmurs or bruit GI: Abdomen is soft.  Mildly tender in the epigastrium without any rebound rigidity or guarding.  No masses organomegaly.   Extremities: No edema.  Full range of motion of lower extremities. Neurologic: Alert and oriented x3.  No focal neurological deficits.    Lab Results:  Data Reviewed: I have personally reviewed following labs and imaging studies  CBC: Recent Labs  Lab 06/28/19 1959 06/29/19 0222 06/30/19 0025  WBC 4.1 4.6 4.9  NEUTROABS 2.2  --   --   HGB 12.5 12.5 12.4  HCT 39.1 37.9 36.7  MCV 90.1 89.6 87.8  PLT 213 198 [id number]    Basic Metabolic Panel: Recent Labs  Lab 06/28/19 1959 06/29/19 0222 06/30/19 0025  NA 134*  --  136  K 3.8  --  4.1  CL 98  --  102  CO2 24  --  23  GLUCOSE 89  --  100*  BUN 37*  --  40*  CREATININE 1.62* 1.88* 1.31*   CALCIUM 8.8*  --  8.8*  MG  --   --  2.2    GFR: Estimated Creatinine Clearance: 33.9 mL/min (A) (by C-G formula based on SCr of 1.31 mg/dL (H)).  Liver Function Tests: Recent Labs  Lab 06/28/19 1959 06/30/19 0025  AST 42* 37  ALT 29 27  ALKPHOS 41 42  BILITOT 0.6 0.5  PROT 7.6 7.3  ALBUMIN 4.0 3.6    Lipid Profile: Recent Labs    06/28/19 1959  TRIG 92    Anemia Panel: Recent Labs    06/28/19 1959 06/30/19 0025  FERRITIN 464* 524*    Recent Results (from the past  240 hour(s))  Novel Coronavirus, NAA ([facility])     Status: Abnormal   Collection Time: 06/23/19 12:00 AM   Specimen: Nasopharyngeal(NP) swabs in vial transport medium   NASOPHARYNGE  TESTING  Result Value Ref Range Status   SARS-CoV-2, NAA Detected (A) Not Detected Final    Comment: This nucleic acid amplification test was developed and its performance characteristics determined by [employer]. Nucleic acid amplification tests include PCR and TMA. This test has not been FDA cleared or approved. This test has been authorized by FDA under an Emergency Use Authorization (EUA). This test is only authorized for the duration of time the declaration that circumstances exist justifying the authorization of the emergency use of in vitro diagnostic tests for detection of SARS-CoV-2 virus and/or diagnosis of COVID-19 infection under section 564(b)(1) of the Act, 21 U.S.C. [id number]) (1), unless the authorization is terminated or revoked sooner. When diagnostic testing is negative, the possibility of a false negative result should be considered in the context of a patient's recent exposures and the presence of clinical signs and symptoms consistent with COVID-19. An individual without symptoms of COVID-19 and who is not shedding SARS-CoV-2 virus would  expect to have a negative (not detected) result in this assay.   Blood Culture (routine x 2)     Status: None (Preliminary result)   Collection  Time: 06/28/19  7:59 PM   Specimen: BLOOD RIGHT HAND  Result Value Ref Range Status   Specimen Description   Final    BLOOD RIGHT HAND Performed at [facility], [place] [address] [place], [place] [address]    Special Requests   Final    BOTTLES DRAWN AEROBIC AND ANAEROBIC Blood Culture adequate volume Performed at [place] [address], [place], [place] [address]    Culture   Final    NO GROWTH < 24 HOURS Performed at [place] [address], [place], [place] [address]    Report Status PENDING  Incomplete  Blood Culture (routine x 2)     Status: None (Preliminary result)   Collection Time: 06/28/19  7:59 PM   Specimen: BLOOD LEFT FOREARM  Result Value Ref Range Status   Specimen Description   Final    BLOOD LEFT FOREARM Performed at [facility], [place] [address], [place], [place] [address]    Special Requests   Final    BOTTLES DRAWN AEROBIC AND ANAEROBIC Blood Culture results may not be optimal due to an inadequate volume of blood received in culture bottles Performed at [place] [address] [place], [place] [address]    Culture   Final    NO GROWTH < 24 HOURS Performed at [place] [address], [place], [place] [address]    Report Status PENDING  Incomplete      Radiology Studies: Dg Chest Port 1 View  Result Date: 06/28/2019 CLINICAL DATA:  Shortness of breath, cough, fever. COVID-19 positive. EXAM: PORTABLE CHEST 1 VIEW COMPARISON:  04/13/2011 FINDINGS: The cardiomediastinal contours are normal. Bilateral streaky subpleural opacities in both lungs, left greater than right. Pulmonary vasculature is normal. No pleural effusion or pneumothorax. No acute osseous abnormalities are seen. IMPRESSION: Streaky opacities in both lungs, left greater than right, pattern consistent with COVID-19 pneumonia. Electronically Signed   By: Keith Rake M.D.   On: 06/28/2019 19:18       LOS: 2  days   [facility] Pager  on [email or web address]  06/30/2019, 10:21 AM

## 2019-06-30 NOTE — Plan of Care (Addendum)
Instructed patient to prone during the night. Deferred but will sleep on side. Patient resting quietly on her left side. 02 at 95% on 4 L via [place]. Patient up to BR to take sponge bath. Some exertional dyspnea noted. Assisted back to bed and patient became teary eyed. Emotional support provided. VSS. NAD noted. 02 sat 93-95% on 3L/[place]. Patient slept most of night on her left side without any difficulty.

## 2019-06-30 NOTE — Progress Notes (Signed)
Initial Nutrition Assessment  DOCUMENTATION CODES:   Not applicable  INTERVENTION:   Ensure Enlive po BID, each supplement provides 350 kcal and 20 grams of protein  Pt receiving Hormel Shake daily with Breakfast which provides 520 kcals and 22 g of protein and Magic cup BID with lunch and dinner, each supplement provides 290 kcal and 9 grams of protein, automatically on meal trays to optimize nutritional intake.   Encourage PO intake    NUTRITION DIAGNOSIS:   Increased nutrient needs related to acute illness(COVID-19) as evidenced by estimated needs.  GOAL:   Patient will meet greater than or equal to 90% of their needs  MONITOR:   PO intake, Supplement acceptance  REASON FOR ASSESSMENT:   Malnutrition Screening Tool    ASSESSMENT:   Pt with PMH of HTN who was dx with COVId-19 on 11/20 pt now admitted with SOB and diarrhea.   Pt on HFNC @ 4L Medications reviewed Labs reviewed    NUTRITION - FOCUSED PHYSICAL EXAM:  Deferred  Diet Order:   Diet Order            Diet regular Room service appropriate? Yes; Fluid consistency: Thin  Diet effective now              EDUCATION NEEDS:   No education needs have been identified at this time  Skin:  Skin Assessment: Reviewed RN Assessment  Last BM:  unknown  Height:   Ht Readings from Last 1 Encounters:  06/28/19 5' (1.524 m)    Weight:   Wt Readings from Last 1 Encounters:  06/30/19 72 kg    Ideal Body Weight:  45.4 kg  BMI:  Body mass index is 31 kg/m.  Estimated Nutritional Needs:   Kcal:  1800-2000  Protein:  90-110 grams  Fluid:  >1.8 L/day  Maylon Peppers RD, LDN, CNSC [phone] Pager [phone] After Hours Pager

## 2019-06-30 NOTE — Progress Notes (Signed)
Will adjust her lovenox back to 40mg  now that renal function has improved.  Rober Minion, PharmD., MS Clinical Pharmacist Pager:  [phone] Thank you for allowing pharmacy to be part of this patients care team. 06/30/2019 9:41 AM

## 2019-07-01 LAB — COMPREHENSIVE METABOLIC PANEL
ALT: 26 U/L (ref 0–44)
AST: 34 U/L (ref 15–41)
Albumin: 3.6 g/dL (ref 3.5–5.0)
Alkaline Phosphatase: 39 U/L (ref 38–126)
Anion gap: 9 (ref 5–15)
BUN: 40 mg/dL — ABNORMAL HIGH (ref 8–23)
CO2: 26 mmol/L (ref 22–32)
Calcium: 8.9 mg/dL (ref 8.9–10.3)
Chloride: 103 mmol/L (ref 98–111)
Creatinine, Ser: 1.15 mg/dL — ABNORMAL HIGH (ref 0.44–1.00)
GFR calc Af Amer: 55 mL/min — ABNORMAL LOW (ref >60)
GFR calc non Af Amer: 47 mL/min — ABNORMAL LOW (ref >60)
Glucose, Bld: 129 mg/dL — ABNORMAL HIGH (ref 70–99)
Potassium: 5.1 mmol/L (ref 3.5–5.1)
Sodium: 138 mmol/L (ref 135–145)
Total Bilirubin: 0.2 mg/dL — ABNORMAL LOW (ref 0.3–1.2)
Total Protein: 7.2 g/dL (ref 6.5–8.1)

## 2019-07-01 LAB — CBC
HCT: 37.7 % (ref 36.0–46.0)
Hemoglobin: 12.4 g/dL (ref 12.0–15.0)
MCH: 29.2 pg (ref 26.0–34.0)
MCHC: 32.9 g/dL (ref 30.0–36.0)
MCV: 88.9 fL (ref 80.0–100.0)
Platelets: 269 10*3/uL (ref 150–400)
RBC: 4.24 MIL/uL (ref 3.87–5.11)
RDW: 13 % (ref 11.5–15.5)
WBC: 4.7 10*3/uL (ref 4.0–10.5)
nRBC: 0 % (ref 0.0–0.2)

## 2019-07-01 LAB — C-REACTIVE PROTEIN: CRP: 2.3 mg/dL — ABNORMAL HIGH (ref <1.0)

## 2019-07-01 LAB — D-DIMER, QUANTITATIVE: D-Dimer, Quant: 0.75 ug/mL-FEU — ABNORMAL HIGH (ref 0.00–0.50)

## 2019-07-01 LAB — FERRITIN: Ferritin: 699 ng/mL — ABNORMAL HIGH (ref 11–307)

## 2019-07-01 LAB — MAGNESIUM: Magnesium: 2.3 mg/dL (ref 1.7–2.4)

## 2019-07-01 MED ORDER — ALBUTEROL SULFATE HFA 108 (90 BASE) MCG/ACT IN AERS
4.0000 | INHALATION_SPRAY | Freq: Four times a day (QID) | RESPIRATORY_TRACT | Status: DC
  Administered 2019-07-01 – 2019-07-04 (×11): 4 via RESPIRATORY_TRACT
  Filled 2019-07-01: qty 6.7

## 2019-07-01 MED ORDER — GUAIFENESIN-DM 100-10 MG/5ML PO SYRP
5.0000 mL | ORAL_SOLUTION | ORAL | Status: DC | PRN
  Administered 2019-07-01 – 2019-07-03 (×4): 5 mL via ORAL
  Filled 2019-07-01 (×5): qty 5

## 2019-07-01 NOTE — Progress Notes (Signed)
PROGRESS NOTE  Cynthia Vasquez [id number] DOB: 1945-12-26 DOA: 06/28/2019  PCP: Leighton Ruff, MD  Brief History/Interval Summary: 73 year old female with hypertension, history of ASD (repaired at age 89) presented with shortness of breath for past 4-5 days associated with body ache and diarrhea.  She reported that symptoms occurred after recent get together where her family members were diagnosed with COVID-19.  She was tested positive for COVID-19 on 11/20.    She subsequently started developing shortness of breath.  In the emergency department she was noted to be hypoxic.  She was hospitalized for further management.    Reason for Visit: Pneumonia due to COVID-19.  Acute respiratory failure with hypoxia.  Consultants: None  Procedures: None  Antibiotics: Anti-infectives (From admission, onward)   Start     Dose/Rate Route Frequency Ordered Stop   06/30/19 0800  remdesivir 100 mg in sodium chloride 0.9 % 250 mL IVPB  Status:  Discontinued     100 mg 500 mL/hr over 30 Minutes Intravenous Daily 06/29/19 0209 06/29/19 1112   06/29/19 1800  remdesivir 100 mg in sodium chloride 0.9 % 250 mL IVPB     100 mg 500 mL/hr over 30 Minutes Intravenous Daily 06/29/19 1112 07/03/19 0959   06/29/19 0300  remdesivir 200 mg in sodium chloride 0.9 % 250 mL IVPB     200 mg 500 mL/hr over 30 Minutes Intravenous Once 06/29/19 0208 06/29/19 0419      Subjective/Interval History: Patient denies any nausea but feels worn out.  Seems anxious.  Continues to have shortness of breath with exertion.  Is coughing more than she was yesterday.    Assessment/Plan:  Acute Hypoxic Resp. Failure/Pneumonia due to COVID-19  COVID-19 Labs  Recent Labs    06/28/19 1959 06/30/19 0025 07/01/19 0048  DDIMER 0.88* 0.81* 0.75*  FERRITIN 464* 524* 699*  LDH 230*  --   --   CRP 1.7* 2.3* 2.3*    Lab Results  Component Value Date   SARSCOV2NAA Detected (A) 06/23/2019     Objective  findings: Fever: Remains afebrile Oxygen requirements: Nasal cannula.  3 L/min.  Saturating in the early 90s.  COVID 19 Therapeutics: Antibacterials: None Remdesivir: Day 4 Steroids: Dexamethasone 6 mg daily Diuretics: Not on scheduled basis Actemra: Not given yet Convalescent Plasma: Not given yet Vitamin C and Zinc: Will initiate PUD Prophylaxis: On Protonix DVT Prophylaxis:  Lovenox   Patient seems to be stable from respiratory standpoint.  She does feel fatigued.  She is coughing more.  Occasional wheezing heard on exam.  Will add inhalers.  Oxygenation is stable.  Nausea is improved.  CRP is stable at 2.3.  D-dimer 0.75.  Ferritin 699.  Continue PPI.  Continue remdesivir and steroids.  No clear indication for Actemra or convalescent plasma.  Consider repeating chest x-ray tomorrow.  Acute renal failure Most likely due to hypovolemia.  Renal function has been improving.  Continue to monitor urine output.  Monitor electrolytes.    Essential hypertension On losartan and hydrochlorothiazide at home which are being held.  Blood pressure is reasonably well controlled.  Continue to monitor.    History of GERD Continue PPI.  Obesity Estimated body mass index is 32.08 kg/m as calculated from the following:   Height as of this encounter: 5' (1.524 m).   Weight as of this encounter: 74.5 kg.   DVT Prophylaxis: Lovenox Code Status: Full code Family Communication: Discussed with patient.  Son being updated on a daily basis.   Disposition  Plan: Continue to mobilize.  Out of bed to chair.  Repeat chest x-ray tomorrow.   Medications:  Scheduled: . albuterol  4 puff Inhalation QID  . aspirin EC  81 mg Oral Daily  . dexamethasone (DECADRON) injection  6 mg Intravenous Daily  . enoxaparin (LOVENOX) injection  40 mg Subcutaneous Q24H  . feeding supplement (ENSURE ENLIVE)  237 mL Oral BID BM  . multivitamin with minerals  1 tablet Oral Daily  . omega-3 acid ethyl esters  1 g Oral Daily   . pantoprazole  40 mg Oral Daily  . vitamin C  500 mg Oral Daily  . cholecalciferol  1,000 Units Oral Daily  . zinc sulfate  220 mg Oral Daily   Continuous: . remdesivir 100 mg in NS 250 mL 100 mg (06/30/19 1014)   [id number] **OR** acetaminophen, guaiFENesin-dextromethorphan, hydrALAZINE, ondansetron **OR** ondansetron (ZOFRAN) IV   Objective:  Vital Signs  Vitals:   06/30/19 2000 07/01/19 0400 07/01/19 0500 07/01/19 0733  BP: 107/71 133/74  (!) 109/59  Pulse:    62  Resp: 16 16  (!) 21  Temp: 98.3 F (36.8 C) 98.3 F (36.8 C)  98.3 F (36.8 C)  TempSrc: Oral Oral  Oral  SpO2:    95%  Weight:   74.5 kg   Height:        Intake/Output Summary (Last 24 hours) at 07/01/2019 0942 Last data filed at 06/30/2019 2000 Gross per 24 hour  Intake 1450 ml  Output -  Net 1450 ml   Filed Weights   06/28/19 2048 06/30/19 0500 07/01/19 0500  Weight: 69.4 kg 72 kg 74.5 kg    General appearance: Awake alert.  In no distress.  Fatigue.  Anxious. Resp: Mildly tachypneic at rest.  Coarse breath sounds bilaterally with crackles at the bases.  Occasional wheezing is heard.  No rhonchi.   Cardio: S1-S2 is normal regular.  No S3-S4.  No rubs murmurs or bruit GI: Abdomen is soft.  Nontender nondistended.  Bowel sounds are present normal.  No masses organomegaly Extremities: No edema.  Full range of motion of lower extremities. Neurologic: Alert and oriented x3.  No focal neurological deficits.     Lab Results:  Data Reviewed: I have personally reviewed following labs and imaging studies  CBC: Recent Labs  Lab 06/28/19 1959 06/29/19 0222 06/30/19 0025 07/01/19 0048  WBC 4.1 4.6 4.9 4.7  NEUTROABS 2.2  --   --   --   HGB 12.5 12.5 12.4 12.4  HCT 39.1 37.9 36.7 37.7  MCV 90.1 89.6 87.8 88.9  PLT 213 198 229 Q000111Q    Basic Metabolic Panel: Recent Labs  Lab 06/28/19 1959 06/29/19 0222 06/30/19 0025 07/01/19 0048  NA 134*  --  136 138  K 3.8  --  4.1 5.1  CL 98   --  102 103  CO2 24  --  23 26  GLUCOSE 89  --  100* 129*  BUN 37*  --  40* 40*  CREATININE 1.62* 1.88* 1.31* 1.15*  CALCIUM 8.8*  --  8.8* 8.9  MG  --   --  2.2 2.3    GFR: Estimated Creatinine Clearance: 39.3 mL/min (A) (by C-G formula based on SCr of 1.15 mg/dL (H)).  Liver Function Tests: Recent Labs  Lab 06/28/19 1959 06/30/19 0025 07/01/19 0048  AST 42* 37 34  ALT 29 27 26   ALKPHOS 41 42 39  BILITOT 0.6 0.5 0.2*  PROT 7.6 7.3 7.2  ALBUMIN 4.0  3.6 3.6    Lipid Profile: Recent Labs    06/28/19 1959  TRIG 92    Anemia Panel: Recent Labs    06/30/19 0025 07/01/19 0048  FERRITIN 524* 699*    Recent Results (from the past 240 hour(s))  Novel Coronavirus, NAA ([facility])     Status: Abnormal   Collection Time: 06/23/19 12:00 AM   Specimen: Nasopharyngeal(NP) swabs in vial transport medium   NASOPHARYNGE  TESTING  Result Value Ref Range Status   SARS-CoV-2, NAA Detected (A) Not Detected Final    Comment: This nucleic acid amplification test was developed and its performance characteristics determined by [employer]. Nucleic acid amplification tests include PCR and TMA. This test has not been FDA cleared or approved. This test has been authorized by FDA under an Emergency Use Authorization (EUA). This test is only authorized for the duration of time the declaration that circumstances exist justifying the authorization of the emergency use of in vitro diagnostic tests for detection of SARS-CoV-2 virus and/or diagnosis of COVID-19 infection under section 564(b)(1) of the Act, 21 U.S.C. [id number]) (1), unless the authorization is terminated or revoked sooner. When diagnostic testing is negative, the possibility of a false negative result should be considered in the context of a patient's recent exposures and the presence of clinical signs and symptoms consistent with COVID-19. An individual without symptoms of COVID-19 and who is not shedding  SARS-CoV-2 virus would  expect to have a negative (not detected) result in this assay.   Blood Culture (routine x 2)     Status: None (Preliminary result)   Collection Time: 06/28/19  7:59 PM   Specimen: BLOOD RIGHT HAND  Result Value Ref Range Status   Specimen Description   Final    BLOOD RIGHT HAND Performed at [facility], [place] [address], [place], [place] [address]    Special Requests   Final    BOTTLES DRAWN AEROBIC AND ANAEROBIC Blood Culture adequate volume Performed at [facility] [address], [place], [place] [address]    Culture   Final    NO GROWTH 2 DAYS Performed at [facility] [address], [place], [place] [address]    Report Status PENDING  Incomplete  Blood Culture (routine x 2)     Status: None (Preliminary result)   Collection Time: 06/28/19  7:59 PM   Specimen: BLOOD LEFT FOREARM  Result Value Ref Range Status   Specimen Description   Final    BLOOD LEFT FOREARM Performed at [facility], [place] [address], [place], [place] [address]    Special Requests   Final    BOTTLES DRAWN AEROBIC AND ANAEROBIC Blood Culture results may not be optimal due to an inadequate volume of blood received in culture bottles Performed at [facility] [address], [place], [place] [address]    Culture   Final    NO GROWTH 2 DAYS Performed at [facility] [address] [place], [place] [address]    Report Status PENDING  Incomplete      Radiology Studies: No results found.     LOS: 3 days   Lavern Crimi [employer] on [email or web address]  07/01/2019, 9:42 AM

## 2019-07-01 NOTE — Plan of Care (Signed)
  Problem: Respiratory: Goal: Will maintain a patent airway Outcome: Progressing   Problem: Nutrition: Goal: Adequate nutrition will be maintained Outcome: Progressing   Problem: Coping: Goal: Level of anxiety will decrease Outcome: Progressing

## 2019-07-02 ENCOUNTER — Inpatient Hospital Stay (HOSPITAL_COMMUNITY): Payer: Medicare HMO

## 2019-07-02 LAB — COMPREHENSIVE METABOLIC PANEL
ALT: 22 U/L (ref 0–44)
AST: 30 U/L (ref 15–41)
Albumin: 3.5 g/dL (ref 3.5–5.0)
Alkaline Phosphatase: 38 U/L (ref 38–126)
Anion gap: 8 (ref 5–15)
BUN: 36 mg/dL — ABNORMAL HIGH (ref 8–23)
CO2: 25 mmol/L (ref 22–32)
Calcium: 8.9 mg/dL (ref 8.9–10.3)
Chloride: 102 mmol/L (ref 98–111)
Creatinine, Ser: 1.15 mg/dL — ABNORMAL HIGH (ref 0.44–1.00)
GFR calc Af Amer: 55 mL/min — ABNORMAL LOW (ref >60)
GFR calc non Af Amer: 47 mL/min — ABNORMAL LOW (ref >60)
Glucose, Bld: 143 mg/dL — ABNORMAL HIGH (ref 70–99)
Potassium: 5.1 mmol/L (ref 3.5–5.1)
Sodium: 135 mmol/L (ref 135–145)
Total Bilirubin: 0.4 mg/dL (ref 0.3–1.2)
Total Protein: 6.9 g/dL (ref 6.5–8.1)

## 2019-07-02 LAB — CBC
HCT: 36.4 % (ref 36.0–46.0)
Hemoglobin: 11.9 g/dL — ABNORMAL LOW (ref 12.0–15.0)
MCH: 29.2 pg (ref 26.0–34.0)
MCHC: 32.7 g/dL (ref 30.0–36.0)
MCV: 89.4 fL (ref 80.0–100.0)
Platelets: 286 10*3/uL (ref 150–400)
RBC: 4.07 MIL/uL (ref 3.87–5.11)
RDW: 13.1 % (ref 11.5–15.5)
WBC: 6.2 10*3/uL (ref 4.0–10.5)
nRBC: 0 % (ref 0.0–0.2)

## 2019-07-02 LAB — D-DIMER, QUANTITATIVE: D-Dimer, Quant: 0.44 ug/mL-FEU (ref 0.00–0.50)

## 2019-07-02 LAB — MAGNESIUM: Magnesium: 2.2 mg/dL (ref 1.7–2.4)

## 2019-07-02 LAB — C-REACTIVE PROTEIN: CRP: 1.5 mg/dL — ABNORMAL HIGH (ref <1.0)

## 2019-07-02 LAB — FERRITIN: Ferritin: 558 ng/mL — ABNORMAL HIGH (ref 11–307)

## 2019-07-02 NOTE — Progress Notes (Signed)
SATURATION QUALIFICATIONS: (This note is used to comply with regulatory documentation for home oxygen)  Patient Saturations on Room Air at Rest = 94-95%  Patient Saturations on Room Air while Ambulating = 93%  Patient Saturations on 0 Liters of oxygen while Ambulating = n/a  Please briefly explain why patient needs home oxygen: Patient tolerated ambulation well. Patient had no signs of distress, dyspnea, or tiredness during the entire walk. No need for oxygen.

## 2019-07-02 NOTE — Progress Notes (Signed)
PROGRESS NOTE  Cynthia Vasquez [id number] DOB: 03/01/1946 DOA: 06/28/2019  PCP: Leighton Ruff, MD  Brief History/Interval Summary: 73 year old female with hypertension, history of ASD (repaired at age 89) presented with shortness of breath for past 4-5 days associated with body ache and diarrhea.  She reported that symptoms occurred after recent get together where her family members were diagnosed with COVID-19.  She was tested positive for COVID-19 on 11/20.    She subsequently started developing shortness of breath.  In the emergency department she was noted to be hypoxic.  She was hospitalized for further management.    Reason for Visit: Pneumonia due to COVID-19.  Acute respiratory failure with hypoxia.  Consultants: None  Procedures: None  Antibiotics: Anti-infectives (From admission, onward)   Start     Dose/Rate Route Frequency Ordered Stop   06/30/19 0800  remdesivir 100 mg in sodium chloride 0.9 % 250 mL IVPB  Status:  Discontinued     100 mg 500 mL/hr over 30 Minutes Intravenous Daily 06/29/19 0209 06/29/19 1112   06/29/19 1800  remdesivir 100 mg in sodium chloride 0.9 % 250 mL IVPB     100 mg 500 mL/hr over 30 Minutes Intravenous Daily 06/29/19 1112 07/03/19 0959   06/29/19 0300  remdesivir 200 mg in sodium chloride 0.9 % 250 mL IVPB     200 mg 500 mL/hr over 30 Minutes Intravenous Once 06/29/19 0208 06/29/19 0419      Subjective/Interval History: Patient says that she is still fatigued.  Overall shortness of breath seems to be improved.  Has walked around in the room.  Looking forward to ambulating in the hallway.  Denies any chest pain.  No nausea or vomiting.    Assessment/Plan:  Acute Hypoxic Resp. Failure/Pneumonia due to COVID-19  COVID-19 Labs  Recent Labs    06/30/19 0025 07/01/19 0048 07/02/19 0040  DDIMER 0.81* 0.75* 0.44  FERRITIN 524* 699* 558*  CRP 2.3* 2.3* 1.5*    Lab Results  Component Value Date   SARSCOV2NAA Detected (A)  06/23/2019     Objective findings: Fever: Remains afebrile Oxygen requirements: Nasal cannula.  1 L/min.  Saturating in the early 90s.  COVID 19 Therapeutics: Antibacterials: None Remdesivir: Day 5 Steroids: Dexamethasone 6 mg daily Diuretics: Not on scheduled basis Actemra: Not given yet Convalescent Plasma: Not given yet Vitamin C and Zinc: Continue PUD Prophylaxis: On Protonix DVT Prophylaxis:  Lovenox   Patient seems to be stable from a respiratory standpoint.  She has improved slightly.  Her oxygen requirements have improved.  Work of breathing is normal.  Inflammatory markers have improved.  She will complete course of remdesivir today.  Continue steroids.  Chest x-ray was done this morning without any significant change from before.  Acute renal failure This was most likely due to hypovolemia from COVID-19.  According to family her creatinine is always slightly on the higher side.  Could be back to her baseline now.  Monitor urine output.  Electrolytes are within normal range today.     Essential hypertension On losartan and hydrochlorothiazide at home which are being held.  Blood pressure is reasonably well controlled.  Continue to monitor.    History of GERD Continue PPI.  Obesity Estimated body mass index is 32.29 kg/m as calculated from the following:   Height as of this encounter: 5' (1.524 m).   Weight as of this encounter: 75 kg.   DVT Prophylaxis: Lovenox Code Status: Full code Family Communication: Discussed with patient.  Son  being updated on a daily basis.   Disposition Plan: Mobilize.  Ambulatory pulse oximetry today.   Medications:  Scheduled: . albuterol  4 puff Inhalation QID  . aspirin EC  81 mg Oral Daily  . dexamethasone (DECADRON) injection  6 mg Intravenous Daily  . enoxaparin (LOVENOX) injection  40 mg Subcutaneous Q24H  . feeding supplement (ENSURE ENLIVE)  237 mL Oral BID BM  . multivitamin with minerals  1 tablet Oral Daily  . omega-3  acid ethyl esters  1 g Oral Daily  . pantoprazole  40 mg Oral Daily  . vitamin C  500 mg Oral Daily  . cholecalciferol  1,000 Units Oral Daily  . zinc sulfate  220 mg Oral Daily   Continuous: . remdesivir 100 mg in NS 250 mL Stopped (07/01/19 1116)   [id number] **OR** acetaminophen, guaiFENesin-dextromethorphan, hydrALAZINE, ondansetron **OR** ondansetron (ZOFRAN) IV   Objective:  Vital Signs  Vitals:   07/01/19 1938 07/01/19 2300 07/02/19 0400 07/02/19 0800  BP: 107/69  137/69 131/62  Pulse: 64 64 63 69  Resp: 17 13 17    Temp: 98.3 F (36.8 C)  97.9 F (36.6 C) 97.8 F (36.6 C)  TempSrc: Oral  Oral Oral  SpO2: 94% 96% 93%   Weight:   75 kg   Height:        Intake/Output Summary (Last 24 hours) at 07/02/2019 1015 Last data filed at 07/02/2019 0400 Gross per 24 hour  Intake 970 ml  Output -  Net 970 ml   Filed Weights   06/30/19 0500 07/01/19 0500 07/02/19 0400  Weight: 72 kg 74.5 kg 75 kg    General appearance: Awake alert.  In no distress.  Fatigue.  Slightly anxious.  Seems more comfortable today than yesterday. Resp: Mildly tachypneic at rest.  Coarse breath sound bilaterally with few crackles at the bases.  No wheezing heard today. Cardio: S1-S2 is normal regular.  No S3-S4.  No rubs murmurs or bruit GI: Abdomen is soft.  Nontender nondistended.  Bowel sounds are present normal.  No masses organomegaly Extremities: No edema.  Full range of motion of lower extremities. Neurologic: Alert and oriented x3.  No focal neurological deficits.     Lab Results:  Data Reviewed: I have personally reviewed following labs and imaging studies  CBC: Recent Labs  Lab 06/28/19 1959 06/29/19 0222 06/30/19 0025 07/01/19 0048 07/02/19 0040  WBC 4.1 4.6 4.9 4.7 6.2  NEUTROABS 2.2  --   --   --   --   HGB 12.5 12.5 12.4 12.4 11.9*  HCT 39.1 37.9 36.7 37.7 36.4  MCV 90.1 89.6 87.8 88.9 89.4  PLT 213 198 229 269 Q000111Q    Basic Metabolic Panel: Recent Labs   Lab 06/28/19 1959 06/29/19 0222 06/30/19 0025 07/01/19 0048 07/02/19 0040  NA 134*  --  136 138 135  K 3.8  --  4.1 5.1 5.1  CL 98  --  102 103 102  CO2 24  --  23 26 25   GLUCOSE 89  --  100* 129* 143*  BUN 37*  --  40* 40* 36*  CREATININE 1.62* 1.88* 1.31* 1.15* 1.15*  CALCIUM 8.8*  --  8.8* 8.9 8.9  MG  --   --  2.2 2.3 2.2    GFR: Estimated Creatinine Clearance: 39.4 mL/min (A) (by C-G formula based on SCr of 1.15 mg/dL (H)).  Liver Function Tests: Recent Labs  Lab 06/28/19 1959 06/30/19 0025 07/01/19 0048 07/02/19 0040  AST 42*  37 34 30  ALT 29 27 26 22   ALKPHOS 41 42 39 38  BILITOT 0.6 0.5 0.2* 0.4  PROT 7.6 7.3 7.2 6.9  ALBUMIN 4.0 3.6 3.6 3.5    Anemia Panel: Recent Labs    07/01/19 0048 07/02/19 0040  FERRITIN 699* 558*    Recent Results (from the past 240 hour(s))  Novel Coronavirus, NAA ([facility])     Status: Abnormal   Collection Time: 06/23/19 12:00 AM   Specimen: Nasopharyngeal(NP) swabs in vial transport medium   NASOPHARYNGE  TESTING  Result Value Ref Range Status   SARS-CoV-2, NAA Detected (A) Not Detected Final    Comment: This nucleic acid amplification test was developed and its performance characteristics determined by [employer]. Nucleic acid amplification tests include PCR and TMA. This test has not been FDA cleared or approved. This test has been authorized by FDA under an Emergency Use Authorization (EUA). This test is only authorized for the duration of time the declaration that circumstances exist justifying the authorization of the emergency use of in vitro diagnostic tests for detection of SARS-CoV-2 virus and/or diagnosis of COVID-19 infection under section 564(b)(1) of the Act, 21 U.S.C. [id number]) (1), unless the authorization is terminated or revoked sooner. When diagnostic testing is negative, the possibility of a false negative result should be considered in the context of a patient's recent exposures and the  presence of clinical signs and symptoms consistent with COVID-19. An individual without symptoms of COVID-19 and who is not shedding SARS-CoV-2 virus would  expect to have a negative (not detected) result in this assay.   Blood Culture (routine x 2)     Status: None (Preliminary result)   Collection Time: 06/28/19  7:59 PM   Specimen: BLOOD RIGHT HAND  Result Value Ref Range Status   Specimen Description   Final    BLOOD RIGHT HAND Performed at [facility], [place] [address] [place], [place] [address]    Special Requests   Final    BOTTLES DRAWN AEROBIC AND ANAEROBIC Blood Culture adequate volume Performed at [facility] [address], [place], [place] [address]    Culture   Final    NO GROWTH 4 DAYS Performed at [facility], [place] [address] [place], [place] [address]    Report Status PENDING  Incomplete  Blood Culture (routine x 2)     Status: None (Preliminary result)   Collection Time: 06/28/19  7:59 PM   Specimen: BLOOD LEFT FOREARM  Result Value Ref Range Status   Specimen Description   Final    BLOOD LEFT FOREARM Performed at [facility], [place] [address] [place], [place] [address]    Special Requests   Final    BOTTLES DRAWN AEROBIC AND ANAEROBIC Blood Culture results may not be optimal due to an inadequate volume of blood received in culture bottles Performed at [facility] [address] [place], [place] [address]    Culture   Final    NO GROWTH 4 DAYS Performed at [facility], [place] [address] [place], [place] [address]    Report Status PENDING  Incomplete      Radiology Studies: Dg Chest Port 1 View  Result Date: 07/02/2019 CLINICAL DATA:  COVID-19 positive. EXAM: PORTABLE CHEST 1 VIEW COMPARISON:  Chest x-ray dated 06/28/2019. FINDINGS: Subtle streaky opacities again noted at the LEFT lung base, suspicious for pneumonia, possibly atelectasis or scarring. RIGHT lung now appears clear. No  pleural effusion or pneumothorax is seen. Stable cardiomegaly. IMPRESSION: 1. Streaky opacities at the LEFT lung base, pneumonia versus mild atelectasis, stable. 2. Stable cardiomegaly. Electronically Signed   By: Franki Cabot M.D.   On: 07/02/2019 06:26       LOS: 4 days   [facility] Pager on [email or web address]  07/02/2019, 10:15 AM

## 2019-07-02 NOTE — Plan of Care (Signed)
Pt slept on and off during the night. Mild headache verbalized, prn Tylenol given. Alert and oriented. Vitals stable O2 weaned to 1L [place]. Minimal dyspnea on exertion and non-productive cough noted, prn Robitussin given. Stand-by assist with ADLs. IV SL. Unable to tolerate prone positioning. No other issues, will monitor.   Problem: Education: Goal: Knowledge of risk factors and measures for prevention of condition will improve Outcome: Progressing   Problem: Coping: Goal: Psychosocial and spiritual needs will be supported Outcome: Progressing   Problem: Respiratory: Goal: Will maintain a patent airway Outcome: Progressing Goal: Complications related to the disease process, condition or treatment will be avoided or minimized Outcome: Progressing

## 2019-07-03 ENCOUNTER — Inpatient Hospital Stay (HOSPITAL_COMMUNITY): Payer: Medicare HMO

## 2019-07-03 LAB — COMPREHENSIVE METABOLIC PANEL
ALT: 22 U/L (ref 0–44)
AST: 29 U/L (ref 15–41)
Albumin: 3.5 g/dL (ref 3.5–5.0)
Alkaline Phosphatase: 32 U/L — ABNORMAL LOW (ref 38–126)
Anion gap: 8 (ref 5–15)
BUN: 31 mg/dL — ABNORMAL HIGH (ref 8–23)
CO2: 26 mmol/L (ref 22–32)
Calcium: 8.8 mg/dL — ABNORMAL LOW (ref 8.9–10.3)
Chloride: 105 mmol/L (ref 98–111)
Creatinine, Ser: 1.01 mg/dL — ABNORMAL HIGH (ref 0.44–1.00)
GFR calc Af Amer: >60 mL/min (ref >60)
GFR calc non Af Amer: 55 mL/min — ABNORMAL LOW (ref >60)
Glucose, Bld: 119 mg/dL — ABNORMAL HIGH (ref 70–99)
Potassium: 5.3 mmol/L — ABNORMAL HIGH (ref 3.5–5.1)
Sodium: 139 mmol/L (ref 135–145)
Total Bilirubin: 0.7 mg/dL (ref 0.3–1.2)
Total Protein: 6.5 g/dL (ref 6.5–8.1)

## 2019-07-03 LAB — CBC
HCT: 36 % (ref 36.0–46.0)
Hemoglobin: 11.7 g/dL — ABNORMAL LOW (ref 12.0–15.0)
MCH: 28.8 pg (ref 26.0–34.0)
MCHC: 32.5 g/dL (ref 30.0–36.0)
MCV: 88.7 fL (ref 80.0–100.0)
Platelets: 293 10*3/uL (ref 150–400)
RBC: 4.06 MIL/uL (ref 3.87–5.11)
RDW: 13 % (ref 11.5–15.5)
WBC: 7.5 10*3/uL (ref 4.0–10.5)
nRBC: 0 % (ref 0.0–0.2)

## 2019-07-03 LAB — CULTURE, BLOOD (ROUTINE X 2)
Culture: NO GROWTH
Culture: NO GROWTH
Special Requests: ADEQUATE

## 2019-07-03 LAB — MAGNESIUM: Magnesium: 2.2 mg/dL (ref 1.7–2.4)

## 2019-07-03 LAB — POTASSIUM: Potassium: 4.3 mmol/L (ref 3.5–5.1)

## 2019-07-03 LAB — C-REACTIVE PROTEIN: CRP: <0.8 mg/dL (ref <1.0)

## 2019-07-03 LAB — FERRITIN: Ferritin: 626 ng/mL — ABNORMAL HIGH (ref 11–307)

## 2019-07-03 LAB — D-DIMER, QUANTITATIVE: D-Dimer, Quant: 0.38 ug/mL-FEU (ref 0.00–0.50)

## 2019-07-03 MED ORDER — ALPRAZOLAM 0.5 MG PO TABS: 0.2500 mg | ORAL_TABLET | Freq: Two times a day (BID) | ORAL | Status: DC | PRN

## 2019-07-03 MED ORDER — FUROSEMIDE 10 MG/ML IJ SOLN
60.0000 mg | Freq: Once | INTRAMUSCULAR | Status: AC
  Administered 2019-07-03: 60 mg via INTRAVENOUS
  Filled 2019-07-03: qty 6

## 2019-07-03 MED ORDER — SODIUM ZIRCONIUM CYCLOSILICATE 10 G PO PACK
10.0000 g | PACK | Freq: Once | ORAL | Status: AC
  Administered 2019-07-03: 10 g via ORAL
  Filled 2019-07-03: qty 1

## 2019-07-03 NOTE — Care Management Important Message (Signed)
Important Message  Patient Details  Name: Cynthia Vasquez MRN: [id number] Date of Birth: 06-06-1946   Medicare Important Message Given:  Yes - Important Message mailed due to current National Emergency  Verbal consent obtained due to current National Emergency  Relationship to patient: Self Contact Name: Tybria Tonini Call Date: 07/03/19  Time: [id number] Phone: [phone] Outcome: No Answer/Busy Important Message mailed to: Patient address on file    Delorse Lek 07/03/2019, 2:20 PM

## 2019-07-03 NOTE — Progress Notes (Signed)
PROGRESS NOTE  Cynthia Vasquez [id number] DOB: 1946/06/14 DOA: 06/28/2019  PCP: Leighton Ruff, MD  Brief History/Interval Summary: 73 year old female with hypertension, history of ASD (repaired at age 63) presented with shortness of breath for past 4-5 days associated with body ache and diarrhea.  She reported that symptoms occurred after recent get together where her family members were diagnosed with COVID-19.  She was tested positive for COVID-19 on 11/20.    She subsequently started developing shortness of breath.  In the emergency department she was noted to be hypoxic.  She was hospitalized for further management.    Reason for Visit: Pneumonia due to COVID-19.  Acute respiratory failure with hypoxia.  Consultants: None  Procedures: None  Antibiotics: Anti-infectives (From admission, onward)   Start     Dose/Rate Route Frequency Ordered Stop   06/30/19 0800  remdesivir 100 mg in sodium chloride 0.9 % 250 mL IVPB  Status:  Discontinued     100 mg 500 mL/hr over 30 Minutes Intravenous Daily 06/29/19 0209 06/29/19 1112   06/29/19 1800  remdesivir 100 mg in sodium chloride 0.9 % 250 mL IVPB     100 mg 500 mL/hr over 30 Minutes Intravenous Daily 06/29/19 1112 07/02/19 1230   06/29/19 0300  remdesivir 200 mg in sodium chloride 0.9 % 250 mL IVPB     200 mg 500 mL/hr over 30 Minutes Intravenous Once 06/29/19 0208 06/29/19 0419      Subjective/Interval History: Patient mentions that she had an episode last night where she got short of breath.  She had to be placed back on oxygen.  Noted to be on 5 L this morning.  Her pulse oximetry probe is not working properly.  Nurse was notified.  Continues to have a cough.  Denies any chest pain.  She did ambulate outside in the hallway yesterday.   Assessment/Plan:  Acute Hypoxic Resp. Failure/Pneumonia due to COVID-19  COVID-19 Labs  Recent Labs    07/01/19 0048 07/02/19 0040 07/03/19 0309  DDIMER 0.75* 0.44 0.38  FERRITIN  699* 558* 626*  CRP 2.3* 1.5* <0.8    Lab Results  Component Value Date   SARSCOV2NAA Detected (A) 06/23/2019    Objective findings: Fever: Remains afebrile Oxygen requirements: Nasal cannula.  Noted to be on 5 L/min this morning.  Saturations are not being recorded accurately.    COVID 19 Therapeutics: Antibacterials: None Remdesivir: Completed course on 11/29 Steroids: Dexamethasone 6 mg daily Diuretics: Not on scheduled basis Actemra: Not given yet Convalescent Plasma: Not given yet Vitamin C and Zinc: Continue PUD Prophylaxis: On Protonix DVT Prophylaxis:  Lovenox   Unclear what transpired last night.  Noted to be on 5 L this morning.  Nurse to change the pulse oximetry probe to get an accurate reading.  Will repeat a chest x-ray.  She has completed course of remdesivir and steroids.  Inflammatory markers have improved.  CRP is normal.  Ferritin is 626.  D-dimer 0.38.  Follow-up on chest x-ray.  Follow-up on pulse oximetry.  May need to consider diuretics.  Acute renal failure/mild hyperkalemia This was most likely due to hypovolemia from COVID-19.  According to family her creatinine is always slightly on the higher side.  Seems to be back to her baseline now.  Monitor urine output.  Potassium noted to be 5.3.  1 dose of Lokelma followed by potassium recheck this afternoon.  This could be due to steroids.  Essential hypertension On losartan and hydrochlorothiazide at home which are being  held.  Blood pressures are reasonably well controlled.  Continue to monitor.    History of GERD Continue PPI.  Obesity Estimated body mass index is 32.29 kg/m as calculated from the following:   Height as of this encounter: 5' (1.524 m).   Weight as of this encounter: 75 kg.   DVT Prophylaxis: Lovenox Code Status: Full code Family Communication: Discussed with patient.  Son being updated on a daily basis.   Disposition Plan: Repeat chest x-ray this morning.  Reassess oxygen  requirements.   Medications:  Scheduled: . albuterol  4 puff Inhalation QID  . aspirin EC  81 mg Oral Daily  . dexamethasone (DECADRON) injection  6 mg Intravenous Daily  . enoxaparin (LOVENOX) injection  40 mg Subcutaneous Q24H  . feeding supplement (ENSURE ENLIVE)  237 mL Oral BID BM  . multivitamin with minerals  1 tablet Oral Daily  . omega-3 acid ethyl esters  1 g Oral Daily  . pantoprazole  40 mg Oral Daily  . vitamin C  500 mg Oral Daily  . cholecalciferol  1,000 Units Oral Daily  . zinc sulfate  220 mg Oral Daily   Continuous:  [id number] **OR** acetaminophen, guaiFENesin-dextromethorphan, hydrALAZINE, ondansetron **OR** ondansetron (ZOFRAN) IV   Objective:  Vital Signs  Vitals:   07/02/19 1618 07/02/19 2010 07/03/19 0422 07/03/19 0428  BP: (!) 145/66 135/83 (!) 131/95   Pulse: 66   (!) 56  Resp: 14 (!) 22 16 17   Temp: 98.8 F (37.1 C)     TempSrc: Oral     SpO2: 95% 93%  95%  Weight:      Height:        Intake/Output Summary (Last 24 hours) at 07/03/2019 1039 Last data filed at 07/02/2019 1835 Gross per 24 hour  Intake 360 ml  Output -  Net 360 ml   Filed Weights   06/30/19 0500 07/01/19 0500 07/02/19 0400  Weight: 72 kg 74.5 kg 75 kg    General appearance: Awake alert.  In no distress.  Slightly anxious. Resp: Normal effort at rest.  Few crackles appreciated bilaterally.  No wheezing or rhonchi. Cardio: S1-S2 is normal regular.  No S3-S4.  No rubs murmurs or bruit GI: Abdomen is soft.  Nontender nondistended.  Bowel sounds are present normal.  No masses organomegaly Extremities: No edema.  Full range of motion of lower extremities. Neurologic: Alert and oriented x3.  No focal neurological deficits.      Lab Results:  Data Reviewed: I have personally reviewed following labs and imaging studies  CBC: Recent Labs  Lab 06/28/19 1959 06/29/19 0222 06/30/19 0025 07/01/19 0048 07/02/19 0040 07/03/19 0309  WBC 4.1 4.6 4.9 4.7 6.2 7.5   NEUTROABS 2.2  --   --   --   --   --   HGB 12.5 12.5 12.4 12.4 11.9* 11.7*  HCT 39.1 37.9 36.7 37.7 36.4 36.0  MCV 90.1 89.6 87.8 88.9 89.4 88.7  PLT 213 198 229 269 286 0000000    Basic Metabolic Panel: Recent Labs  Lab 06/28/19 1959 06/29/19 0222 06/30/19 0025 07/01/19 0048 07/02/19 0040 07/03/19 0309  NA 134*  --  136 138 135 139  K 3.8  --  4.1 5.1 5.1 5.3*  CL 98  --  102 103 102 105  CO2 24  --  23 26 25 26   GLUCOSE 89  --  100* 129* 143* 119*  BUN 37*  --  40* 40* 36* 31*  CREATININE 1.62* 1.88* 1.31*  1.15* 1.15* 1.01*  CALCIUM 8.8*  --  8.8* 8.9 8.9 8.8*  MG  --   --  2.2 2.3 2.2 2.2    GFR: Estimated Creatinine Clearance: 44.9 mL/min (A) (by C-G formula based on SCr of 1.01 mg/dL (H)).  Liver Function Tests: Recent Labs  Lab 06/28/19 1959 06/30/19 0025 07/01/19 0048 07/02/19 0040 07/03/19 0309  AST 42* 37 34 30 29  ALT 29 27 26 22 22   ALKPHOS 41 42 39 38 32*  BILITOT 0.6 0.5 0.2* 0.4 0.7  PROT 7.6 7.3 7.2 6.9 6.5  ALBUMIN 4.0 3.6 3.6 3.5 3.5    Anemia Panel: Recent Labs    07/02/19 0040 07/03/19 0309  FERRITIN 558* 626*    Recent Results (from the past 240 hour(s))  Blood Culture (routine x 2)     Status: None   Collection Time: 06/28/19  7:59 PM   Specimen: BLOOD RIGHT HAND  Result Value Ref Range Status   Specimen Description   Final    BLOOD RIGHT HAND Performed at [facility], [place] [address], [place], [place] [address]    Special Requests   Final    BOTTLES DRAWN AEROBIC AND ANAEROBIC Blood Culture adequate volume Performed at [facility] [address] [place], [place] [address]    Culture   Final    NO GROWTH 5 DAYS Performed at [facility], [place] [address], [place], [place] [address]    Report Status 07/03/2019 FINAL  Final  Blood Culture (routine x 2)     Status: None   Collection Time: 06/28/19  7:59 PM   Specimen: BLOOD LEFT FOREARM  Result Value Ref Range Status   Specimen Description    Final    BLOOD LEFT FOREARM Performed at [facility], [place] [address] [place], [place] [address]    Special Requests   Final    BOTTLES DRAWN AEROBIC AND ANAEROBIC Blood Culture results may not be optimal due to an inadequate volume of blood received in culture bottles Performed at [facility] [address] [place], [place] [address]    Culture   Final    NO GROWTH 5 DAYS Performed at [facility], [place] [address] [place],  [address]    Report Status 07/03/2019 FINAL  Final      Radiology Studies: Dg Chest Port 1 View  Result Date: 07/02/2019 CLINICAL DATA:  COVID-19 positive. EXAM: PORTABLE CHEST 1 VIEW COMPARISON:  Chest x-ray dated 06/28/2019. FINDINGS: Subtle streaky opacities again noted at the LEFT lung base, suspicious for pneumonia, possibly atelectasis or scarring. RIGHT lung now appears clear. No pleural effusion or pneumothorax is seen. Stable cardiomegaly. IMPRESSION: 1. Streaky opacities at the LEFT lung base, pneumonia versus mild atelectasis, stable. 2. Stable cardiomegaly. Electronically Signed   By: Franki Cabot M.D.   On: 07/02/2019 06:26       LOS: 5 days   [facility] Pager on [email or web address]  07/03/2019, 10:39 AM

## 2019-07-04 LAB — COMPREHENSIVE METABOLIC PANEL
ALT: 27 U/L (ref 0–44)
AST: 27 U/L (ref 15–41)
Albumin: 3.7 g/dL (ref 3.5–5.0)
Alkaline Phosphatase: 40 U/L (ref 38–126)
Anion gap: 11 (ref 5–15)
BUN: 34 mg/dL — ABNORMAL HIGH (ref 8–23)
CO2: 27 mmol/L (ref 22–32)
Calcium: 9.3 mg/dL (ref 8.9–10.3)
Chloride: 101 mmol/L (ref 98–111)
Creatinine, Ser: 1.03 mg/dL — ABNORMAL HIGH (ref 0.44–1.00)
GFR calc Af Amer: >60 mL/min (ref >60)
GFR calc non Af Amer: 54 mL/min — ABNORMAL LOW (ref >60)
Glucose, Bld: 116 mg/dL — ABNORMAL HIGH (ref 70–99)
Potassium: 4.6 mmol/L (ref 3.5–5.1)
Sodium: 139 mmol/L (ref 135–145)
Total Bilirubin: 0.5 mg/dL (ref 0.3–1.2)
Total Protein: 7.2 g/dL (ref 6.5–8.1)

## 2019-07-04 LAB — CBC
HCT: 38.9 % (ref 36.0–46.0)
Hemoglobin: 12.8 g/dL (ref 12.0–15.0)
MCH: 29.3 pg (ref 26.0–34.0)
MCHC: 32.9 g/dL (ref 30.0–36.0)
MCV: 89 fL (ref 80.0–100.0)
Platelets: 346 10*3/uL (ref 150–400)
RBC: 4.37 MIL/uL (ref 3.87–5.11)
RDW: 13.1 % (ref 11.5–15.5)
WBC: 9.5 10*3/uL (ref 4.0–10.5)
nRBC: 0 % (ref 0.0–0.2)

## 2019-07-04 LAB — MAGNESIUM: Magnesium: 2.2 mg/dL (ref 1.7–2.4)

## 2019-07-04 MED ORDER — FUROSEMIDE 40 MG PO TABS: 40.0000 mg | ORAL_TABLET | Freq: Every day | ORAL | 0 refills | Status: DC

## 2019-07-04 MED ORDER — ALBUTEROL SULFATE HFA 108 (90 BASE) MCG/ACT IN AERS: 2.0000 | INHALATION_SPRAY | Freq: Four times a day (QID) | RESPIRATORY_TRACT | 0 refills | Status: DC | PRN

## 2019-07-04 MED ORDER — ASCORBIC ACID 500 MG PO TABS: 500.0000 mg | ORAL_TABLET | Freq: Every day | ORAL | 0 refills | Status: AC

## 2019-07-04 MED ORDER — DEXAMETHASONE 2 MG PO TABS: ORAL_TABLET | ORAL | 0 refills | Status: DC

## 2019-07-04 NOTE — Plan of Care (Signed)
Gary walked a little her heart rate went up to 120's she started feeling weak her O2 dipped down in the 80's we didnt walk long just a few minutes then she wanted to go back to room. She said she got a headache after she is laying down in bed now, I put her on 1L [place] she was in high 80's - 92%

## 2019-07-04 NOTE — Discharge Summary (Signed)
[facility]  Physician Discharge Summary   Patient ID: Cynthia Vasquez MRN: [id number] DOB/AGE: [birth year] 73 y.o.  Admit date: 06/28/2019 Discharge date: 07/04/2019  PCP: Leighton Ruff, MD  DISCHARGE DIAGNOSES:  Pneumonia due to COVID-19 Acute respiratory failure with hypoxia Acute renal failure, resolved Essential hypertension History of GERD  RECOMMENDATIONS FOR OUTPATIENT FOLLOW UP: 1. Home oxygen will be ordered 2. Ambulatory referral to pulmonology   Home Health: None Equipment/Devices: Home oxygen  CODE STATUS: Full code  DISCHARGE CONDITION: Fair  Diet recommendation: As before  INITIAL HISTORY: 73 year old female with hypertension, history of ASD (repaired at age 6) presented with shortness of breath for past 4-5 days associated with body ache and diarrhea. She reported that symptoms occurred after recent get together where her family members were diagnosed with COVID-19. She was tested positive for COVID-19 on 11/20.   She subsequently started developing shortness of breath.  In the emergency department she was noted to be hypoxic.  She was hospitalized for further management   HOSPITAL COURSE:   Acute Hypoxic Resp. Failure/Pneumonia due to COVID-19 Patient was admitted to the hospital.  Placed on remdesivir and steroids.  Inflammatory markers were checked and trended.  Patient started improving.  Then yesterday morning she had an episode of shortness of breath and was noted to be hypoxic.  She was given Lasix with improvement.  She is ambulated.  She will need home oxygen.  She will be given a prescription for Lasix for 5 more days.  Acute renal failure/mild hyperkalemia This was most likely due to hypovolemia from COVID-19.  According to family her creatinine is always slightly on the higher side.  Seems to be back to her baseline now.    Essential hypertension Stable.  Continue with home medications.    History of GERD Continue  PPI.  Obesity Estimated body mass index is 32.29 kg/m as calculated from the following:   Height as of this encounter: 5' (1.524 m).   Weight as of this encounter: 75 kg.  Overall stable.  Okay for discharge home today.   PERTINENT LABS:  The results of significant diagnostics from this hospitalization (including imaging, microbiology, ancillary and laboratory) are listed below for reference.    Microbiology: Recent Results (from the past 240 hour(s))  Blood Culture (routine x 2)     Status: None   Collection Time: 06/28/19  7:59 PM   Specimen: BLOOD RIGHT HAND  Result Value Ref Range Status   Specimen Description   Final    BLOOD RIGHT HAND Performed at [facility], [place] [address] [place], [place] [address]    Special Requests   Final    BOTTLES DRAWN AEROBIC AND ANAEROBIC Blood Culture adequate volume Performed at [facility] [address], [place], [place] [address]    Culture   Final    NO GROWTH 5 DAYS Performed at [facility], [place] [address], [place], [place] [address]    Report Status 07/03/2019 FINAL  Final  Blood Culture (routine x 2)     Status: None   Collection Time: 06/28/19  7:59 PM   Specimen: BLOOD LEFT FOREARM  Result Value Ref Range Status   Specimen Description   Final    BLOOD LEFT FOREARM Performed at [facility], [place] [address] [place], [place] [address]    Special Requests   Final    BOTTLES DRAWN AEROBIC AND ANAEROBIC Blood Culture results may not be optimal due to  an inadequate volume of blood received in culture bottles Performed at [facility] [address], [place], [place] [address]    Culture   Final    NO GROWTH 5 DAYS Performed at [facility], [place] [address] [place], [place] [address]    Report Status 07/03/2019 FINAL  Final     Labs:  COVID-19 Labs  Recent Labs    07/02/19 0040 07/03/19 0309  DDIMER 0.44 0.38  FERRITIN 558* 626*  CRP 1.5* <0.8     Lab Results  Component Value Date   SARSCOV2NAA Detected (A) 06/23/2019      Basic Metabolic Panel: Recent Labs  Lab 06/30/19 0025 07/01/19 0048 07/02/19 0040 07/03/19 0309 07/03/19 1303 07/04/19 0333  NA 136 138 135 139  --  139  K 4.1 5.1 5.1 5.3* 4.3 4.6  CL 102 103 102 105  --  101  CO2 23 26 25 26   --  27  GLUCOSE 100* 129* 143* 119*  --  116*  BUN 40* 40* 36* 31*  --  34*  CREATININE 1.31* 1.15* 1.15* 1.01*  --  1.03*  CALCIUM 8.8* 8.9 8.9 8.8*  --  9.3  MG 2.2 2.3 2.2 2.2  --  2.2   Liver Function Tests: Recent Labs  Lab 06/30/19 0025 07/01/19 0048 07/02/19 0040 07/03/19 0309 07/04/19 0333  AST 37 34 30 29 27   ALT 27 26 22 22 27   ALKPHOS 42 39 38 32* 40  BILITOT 0.5 0.2* 0.4 0.7 0.5  PROT 7.3 7.2 6.9 6.5 7.2  ALBUMIN 3.6 3.6 3.5 3.5 3.7   CBC: Recent Labs  Lab 06/28/19 1959  06/30/19 0025 07/01/19 0048 07/02/19 0040 07/03/19 0309 07/04/19 0333  WBC 4.1   < > 4.9 4.7 6.2 7.5 9.5  NEUTROABS 2.2  --   --   --   --   --   --   HGB 12.5   < > 12.4 12.4 11.9* 11.7* 12.8  HCT 39.1   < > 36.7 37.7 36.4 36.0 38.9  MCV 90.1   < > 87.8 88.9 89.4 88.7 89.0  PLT 213   < > 229 269 286 293 346   < > = values in this interval not displayed.     IMAGING STUDIES Dg Chest Port 1 View  Result Date: 07/03/2019 CLINICAL DATA:  Pneumonia. COVID-19. EXAM: PORTABLE CHEST 1 VIEW COMPARISON:  July 02, 2019. FINDINGS: The heart size and mediastinal contours are within normal limits. No pneumothorax or pleural effusion is noted. Mildly increased bilateral peripheral airspace opacities are noted concerning for multifocal pneumonia. The visualized skeletal structures are unremarkable. IMPRESSION: Mildly increased bilateral peripheral airspace opacities are noted concerning for multifocal pneumonia. Electronically Signed   By: Marijo Conception M.D.   On: 07/03/2019 13:04   Dg Chest Port 1 View  Result Date: 07/02/2019 CLINICAL DATA:  COVID-19 positive. EXAM:  PORTABLE CHEST 1 VIEW COMPARISON:  Chest x-ray dated 06/28/2019. FINDINGS: Subtle streaky opacities again noted at the LEFT lung base, suspicious for pneumonia, possibly atelectasis or scarring. RIGHT lung now appears clear. No pleural effusion or pneumothorax is seen. Stable cardiomegaly. IMPRESSION: 1. Streaky opacities at the LEFT lung base, pneumonia versus mild atelectasis, stable. 2. Stable cardiomegaly. Electronically Signed   By: Franki Cabot M.D.   On: 07/02/2019 06:26   Dg Chest Port 1 View  Result Date: 06/28/2019 CLINICAL DATA:  Shortness of breath, cough, fever. COVID-19 positive. EXAM: PORTABLE CHEST 1 VIEW  COMPARISON:  04/13/2011 FINDINGS: The cardiomediastinal contours are normal. Bilateral streaky subpleural opacities in both lungs, left greater than right. Pulmonary vasculature is normal. No pleural effusion or pneumothorax. No acute osseous abnormalities are seen. IMPRESSION: Streaky opacities in both lungs, left greater than right, pattern consistent with COVID-19 pneumonia. Electronically Signed   By: Keith Rake M.D.   On: 06/28/2019 19:18    DISCHARGE EXAMINATION: Vitals:   07/03/19 1913 07/04/19 0353 07/04/19 0937 07/04/19 1159  BP: (!) 114/56 (!) 121/58 (!) 113/57 115/68  Pulse: 66 (!) 54  81  Resp: 20 19  15   Temp: 98.2 F (36.8 C)  98.1 F (36.7 C) 99.3 F (37.4 C)  TempSrc: Oral  Oral Oral  SpO2: 93% 94%  90%  Weight:  69.6 kg    Height:       General appearance: Awake alert.  In no distress Resp: Normal effort at rest.  Coarse breath sound bilaterally with few crackles at the bases.  No wheezing or rhonchi. Cardio: S1-S2 is normal regular.  No S3-S4.  No rubs murmurs or bruit GI: Abdomen is soft.  Nontender nondistended.  Bowel sounds are present normal.  No masses organomegaly    DISPOSITION: Home  Discharge Instructions    Ambulatory referral to Pulmonology   Complete by: As directed    covid patient, home o2. post dc follow up. 2-3 weeks    Call MD for:  difficulty breathing, headache or visual disturbances   Complete by: As directed    Call MD for:  extreme fatigue   Complete by: As directed    Call MD for:  persistant dizziness or light-headedness   Complete by: As directed    Call MD for:  persistant nausea and vomiting   Complete by: As directed    Call MD for:  severe uncontrolled pain   Complete by: As directed    Diet - low sodium heart healthy   Complete by: As directed    Discharge instructions   Complete by: As directed    Please take medications as prescribed.  Please be sure to follow-up with your primary care provider and 2 weeks.  We are holding your blood pressure medication for now.  Your PCP will need to resume it depending on your blood pressure readings at follow-up.  COVID 19 INSTRUCTIONS  - You are felt to be stable enough to no longer require inpatient monitoring, testing, and treatment, though you will need to follow the recommendations below: - Based on the CDC's non-test criteria for ending self-isolation: You may not return to work/leave the home until at least 21 days since symptom onset AND 3 days without a fever (without taking tylenol, ibuprofen, etc.) AND have improvement in respiratory symptoms. - Do not take NSAID medications (including, but not limited to, ibuprofen, advil, motrin, naproxen, aleve, goody's powder, etc.) - Follow up with your doctor in the next week via telehealth or seek medical attention right away if your symptoms get WORSE.  - Consider donating plasma after you have recovered (either 14 days after a negative test or 28 days after symptoms have completely resolved) because your antibodies to this virus may be helpful to give to others with life-threatening infections. Please go to the website www.oneblood.org if you would like to consider volunteering for plasma donation.    Directions for you at home:  Wear a facemask You should wear a facemask that covers your nose and  mouth when you are in the same room with  other people and when you visit a healthcare provider. People who live with or visit you should also wear a facemask while they are in the same room with you.  Separate yourself from other people in your home As much as possible, you should stay in a different room from other people in your home. Also, you should use a separate bathroom, if available.  Avoid sharing household items You should not share dishes, drinking glasses, cups, eating utensils, towels, bedding, or other items with other people in your home. After using these items, you should wash them thoroughly with soap and water.  Cover your coughs and sneezes Cover your mouth and nose with a tissue when you cough or sneeze, or you can cough or sneeze into your sleeve. Throw used tissues in a lined trash can, and immediately wash your hands with soap and water for at least 20 seconds or use an alcohol-based hand rub.  Wash your [facility] your hands often and thoroughly with soap and water for at least 20 seconds. You can use an alcohol-based hand sanitizer if soap and water are not available and if your hands are not visibly dirty. Avoid touching your eyes, nose, and mouth with unwashed hands.  Directions for those who live with, or provide care at home for you:  Limit the number of people who have contact with the patient If possible, have only one caregiver for the patient. Other household members should stay in another home or place of residence. If this is not possible, they should stay in another room, or be separated from the patient as much as possible. Use a separate bathroom, if available. Restrict visitors who do not have an essential need to be in the home.  Ensure good ventilation Make sure that shared spaces in the home have good air flow, such as from an air conditioner or an opened window, weather permitting.  Wash your hands often Wash your hands often and  thoroughly with soap and water for at least 20 seconds. You can use an alcohol based hand sanitizer if soap and water are not available and if your hands are not visibly dirty. Avoid touching your eyes, nose, and mouth with unwashed hands. Use disposable paper towels to dry your hands. If not available, use dedicated cloth towels and replace them when they become wet.  Wear a facemask and gloves Wear a disposable facemask at all times in the room and gloves when you touch or have contact with the patient's blood, body fluids, and/or secretions or excretions, such as sweat, saliva, sputum, nasal mucus, vomit, urine, or feces.  Ensure the mask fits over your nose and mouth tightly, and do not touch it during use. Throw out disposable facemasks and gloves after using them. Do not reuse. Wash your hands immediately after removing your facemask and gloves. If your personal clothing becomes contaminated, carefully remove clothing and launder. Wash your hands after handling contaminated clothing. Place all used disposable facemasks, gloves, and other waste in a lined container before disposing them with other household waste. Remove gloves and wash your hands immediately after handling these items.  Do not share dishes, glasses, or other household items with the patient Avoid sharing household items. You should not share dishes, drinking glasses, cups, eating utensils, towels, bedding, or other items with a patient who is confirmed to have, or being evaluated for, COVID-19 infection. After the person uses these items, you should wash them thoroughly with soap and water.  Wash  laundry thoroughly Immediately remove and wash clothes or bedding that have blood, body fluids, and/or secretions or excretions, such as sweat, saliva, sputum, nasal mucus, vomit, urine, or feces, on them. Wear gloves when handling laundry from the patient. Read and follow directions on labels of laundry or clothing items and  detergent. In general, wash and dry with the warmest temperatures recommended on the label.  Clean all areas the individual has used often Clean all touchable surfaces, such as counters, tabletops, doorknobs, bathroom fixtures, toilets, phones, keyboards, tablets, and bedside tables, every day. Also, clean any surfaces that may have blood, body fluids, and/or secretions or excretions on them. Wear gloves when cleaning surfaces the patient has come in contact with. Use a diluted bleach solution (e.g., dilute bleach with 1 part bleach and 10 parts water) or a household disinfectant with a label that says EPA-registered for coronaviruses. To make a bleach solution at home, add 1 tablespoon of bleach to 1 quart (4 cups) of water. For a larger supply, add  cup of bleach to 1 gallon (16 cups) of water. Read labels of cleaning products and follow recommendations provided on product labels. Labels contain instructions for safe and effective use of the cleaning product including precautions you should take when applying the product, such as wearing gloves or eye protection and making sure you have good ventilation during use of the product. Remove gloves and wash hands immediately after cleaning.  Monitor yourself for signs and symptoms of illness Caregivers and household members are considered close contacts, should monitor their health, and will be asked to limit movement outside of the home to the extent possible. Follow the monitoring steps for close contacts listed on the symptom monitoring form.   If you have additional questions, contact your local health department or call the epidemiologist on call at [phone] (available 24/7). This guidance is subject to change. For the most up-to-date guidance from [facility], please refer to their website: YouBlogs.pl   You were cared for by a hospitalist during your hospital stay. If you have any  questions about your discharge medications or the care you received while you were in the hospital after you are discharged, you can call the unit and asked to speak with the hospitalist on call if the hospitalist that took care of you is not available. Once you are discharged, your primary care physician will handle any further medical issues. Please note that NO REFILLS for any discharge medications will be authorized once you are discharged, as it is imperative that you return to your primary care physician (or establish a relationship with a primary care physician if you do not have one) for your aftercare needs so that they can reassess your need for medications and monitor your lab values. If you do not have a primary care physician, you can call [phone] for a physician referral.   Increase activity slowly   Complete by: As directed    MyChart COVID-19 home monitoring program   Complete by: Jul 04, 2019    Is the patient willing to use the [place] for home monitoring?: Yes   Temperature monitoring   Complete by: Jul 04, 2019    After how many days would you like to receive a notification of this patient's flowsheet entries?: 1        Allergies as of 07/04/2019   No Known Allergies     Medication List    STOP taking these medications   hydrochlorothiazide 25 MG tablet  Commonly known as: HYDRODIURIL   losartan 50 MG tablet Commonly known as: COZAAR     TAKE these medications   acetaminophen 500 MG tablet Commonly known as: TYLENOL Take 500 mg by mouth every 6 (six) hours as needed for mild pain.   albuterol 108 (90 Base) MCG/ACT inhaler Commonly known as: VENTOLIN HFA Inhale 2 puffs into the lungs every 6 (six) hours as needed for wheezing or shortness of breath.   ascorbic acid 500 MG tablet Commonly known as: VITAMIN C Take 1 tablet (500 mg total) by mouth daily for 10 days. Start taking on: July 05, 2019   cholecalciferol 1000 units tablet Commonly  known as: VITAMIN D Take 1,000 Units by mouth daily.   dexamethasone 2 MG tablet Commonly known as: DECADRON Take 2 tablets once daily for 3 days, then 1 tablet once daily for 3 days, then STOP.   furosemide 40 MG tablet Commonly known as: LASIX Take 1 tablet (40 mg total) by mouth daily for 5 days.   multivitamin with minerals tablet Take 1 tablet by mouth daily.   pantoprazole 40 MG tablet Commonly known as: PROTONIX Take 40 mg by mouth daily as needed (heart burn).   zinc gluconate 50 MG tablet Take 50 mg by mouth daily.            Durable Medical Equipment  (From admission, onward)         Start     Ordered   07/04/19 1127  For home use only DME oxygen  Once    Question Answer Comment  Length of Need 6 Months   Mode or (Route) Nasal cannula   Liters per Minute 2   Frequency Continuous (stationary and portable oxygen unit needed)   Oxygen conserving device Yes   Oxygen delivery system Gas      07/04/19 1127           Follow-up Information    Leighton Ruff, MD. Schedule an appointment as soon as possible for a visit in 2 week(s).   Specialty: Family Medicine Contact information: [place] [address] [phone]           TOTAL DISCHARGE TIME: 9 minutes  [facility] Pager on [email or web address]  07/04/2019, 3:00 PM

## 2019-07-04 NOTE — Plan of Care (Signed)
SATURATION QUALIFICATIONS: (This note is used to comply with regulatory documentation for home oxygen)  Patient Saturations on Room Air at Rest = 94%  Patient Saturations on Room Air while Ambulating = 85%  Patient Saturations on 2 Liters of oxygen while Ambulating =92%   Please briefly explain why patient needs home oxygen: Pt got weak immediately when walking started to desat into the 80's she also expressed she got a headache we went back to the room and had to lay down to regain her strength.

## 2019-07-04 NOTE — Discharge Instructions (Signed)

## 2019-07-04 NOTE — TOC Progression Note (Signed)
Transition of Care [facility]) - Progression Note    Patient Details  Name: Cynthia Vasquez MRN: [id number] Date of Birth: 1946/04/16  Transition of [facility] CM/SW Contact  Joaquin Courts, RN Phone Number: 07/04/2019, 12:05 PM  Clinical Narrative:    Patient set up for home O2 with [employer]. Agency to deliver concentrator to home. [facility] to deliver portable concentrator to bedside for discharge.   Expected Discharge Plan: Home/Self Care Barriers to Discharge: No Barriers Identified  Expected Discharge Plan and Services Expected Discharge Plan: Home/Self Care   Discharge Planning Services: CM Consult   Living arrangements for the past 2 months: Single Family Home Expected Discharge Date: 07/04/19               DME Arranged: Oxygen DME Agency: [facility] Date DME Agency Contacted: 07/04/19 Time DME Agency Contacted: 1204 Representative spoke with at DME Agency: Learta Codding HH Arranged: NA [place] Agency: NA         Social Determinants of Health ([place]) Interventions    Readmission Risk Interventions No flowsheet data found.

## 2019-07-06 DIAGNOSIS — U071 COVID-19: Secondary | ICD-10-CM | POA: Diagnosis not present

## 2019-07-06 DIAGNOSIS — I10 Essential (primary) hypertension: Secondary | ICD-10-CM | POA: Diagnosis not present

## 2019-07-06 DIAGNOSIS — R399 Unspecified symptoms and signs involving the genitourinary system: Secondary | ICD-10-CM | POA: Diagnosis not present

## 2019-08-02 ENCOUNTER — Ambulatory Visit: Payer: Medicare HMO | Attending: Internal Medicine

## 2019-08-02 DIAGNOSIS — R238 Other skin changes: Secondary | ICD-10-CM | POA: Diagnosis not present

## 2019-08-02 DIAGNOSIS — U071 COVID-19: Secondary | ICD-10-CM | POA: Diagnosis not present

## 2019-08-03 LAB — NOVEL CORONAVIRUS, NAA: SARS-CoV-2, NAA: NOT DETECTED

## 2019-08-04 DIAGNOSIS — U071 COVID-19: Secondary | ICD-10-CM | POA: Diagnosis not present

## 2019-08-09 ENCOUNTER — Ambulatory Visit: Payer: Medicare HMO | Admitting: Pulmonary Disease

## 2019-08-09 ENCOUNTER — Ambulatory Visit (INDEPENDENT_AMBULATORY_CARE_PROVIDER_SITE_OTHER): Payer: Medicare HMO

## 2019-08-09 ENCOUNTER — Other Ambulatory Visit: Payer: Self-pay

## 2019-08-09 ENCOUNTER — Encounter: Payer: Self-pay | Admitting: Pulmonary Disease

## 2019-08-09 VITALS — BP 120/80 | HR 76 | Temp 97.9°F | Ht 60.0 in | Wt 163.6 lb

## 2019-08-09 DIAGNOSIS — Z8616 Personal history of COVID-19: Secondary | ICD-10-CM

## 2019-08-09 DIAGNOSIS — R05 Cough: Secondary | ICD-10-CM | POA: Diagnosis not present

## 2019-08-09 DIAGNOSIS — U071 COVID-19: Secondary | ICD-10-CM

## 2019-08-09 DIAGNOSIS — J9600 Acute respiratory failure, unspecified whether with hypoxia or hypercapnia: Secondary | ICD-10-CM

## 2019-08-09 DIAGNOSIS — J9811 Atelectasis: Secondary | ICD-10-CM | POA: Diagnosis not present

## 2019-08-09 DIAGNOSIS — Z09 Encounter for follow-up examination after completed treatment for conditions other than malignant neoplasm: Secondary | ICD-10-CM

## 2019-08-09 DIAGNOSIS — J189 Pneumonia, unspecified organism: Secondary | ICD-10-CM | POA: Diagnosis not present

## 2019-08-09 MED ORDER — BENZONATATE 100 MG PO CAPS: 100.0000 mg | ORAL_CAPSULE | Freq: Two times a day (BID) | ORAL | 1 refills | Status: DC | PRN

## 2019-08-09 NOTE — Patient Instructions (Addendum)
We will schedule you for a chest x-ray and pulmonary function test I will give you a call about the x-ray.  If abnormal then we may consider getting a CT scan Will order Tessalon Perles 100 mg twice daily as needed Follow-up in 3 months.

## 2019-08-09 NOTE — Addendum Note (Signed)
Addended by: Hildred Alamin I on: 08/09/2019 10:54 AM   Modules accepted: Orders

## 2019-08-09 NOTE — Progress Notes (Signed)
Cynthia Vasquez    [id number]    11-Dec-1945  Primary Care Physician:Barnes, Benjamine Mola, MD  Referring Physician: Bonnielee Haff, MD [address] [place],  [place] [address]  Chief complaint: Follow-up for post Covid 19 pneumonia  HPI: 74 year old with history of hypertension, ASD [repaired at age 59] admitted to [facility] from 11/25-12/1 with COVID-19 pneumonia.  Treated with remdesivir and steroids.  States that her breathing is slowly improving.  She is not on supplemental oxygen at home Her chief complaint is persistent dry cough, nonproductive in nature.  No fevers or chills  Pets: Cats, no dogs, birds, farm animals Occupation: Retired Optometrist Exposures: No known exposures.  No mold, hot tub, Jacuzzi Smoking history: Never smoked Travel history: No significant travel history Relevant family history: No significant family history of lung disease  Outpatient Encounter Medications as of 08/09/2019  Medication Sig  . acetaminophen (TYLENOL) 500 MG tablet Take 500 mg by mouth every 6 (six) hours as needed for mild pain.   Marland Kitchen albuterol (VENTOLIN HFA) 108 (90 Base) MCG/ACT inhaler Inhale 2 puffs into the lungs every 6 (six) hours as needed for wheezing or shortness of breath.  . Ascorbic Acid (VITAMIN C) 1000 MG tablet Take 1,000 mg by mouth daily.  . cholecalciferol (VITAMIN D) 1000 UNITS tablet Take 1,000 Units by mouth daily.  . hydrochlorothiazide (HYDRODIURIL) 25 MG tablet Take 1 tablet by mouth daily.  Marland Kitchen latanoprost (XALATAN) 0.005 % ophthalmic solution Place 1 drop into both eyes at bedtime.  Marland Kitchen losartan (COZAAR) 50 MG tablet Take 1 tablet by mouth daily.  . Multiple Vitamins-Minerals (MULTIVITAMIN WITH MINERALS) tablet Take 1 tablet by mouth daily.  . pantoprazole (PROTONIX) 40 MG tablet Take 40 mg by mouth daily as needed (heart burn).   . zinc gluconate 50 MG tablet Take 50 mg by mouth daily.  . [DISCONTINUED] dexamethasone (DECADRON) 2 MG  tablet Take 2 tablets once daily for 3 days, then 1 tablet once daily for 3 days, then STOP.  . [DISCONTINUED] furosemide (LASIX) 40 MG tablet Take 1 tablet (40 mg total) by mouth daily for 5 days.   No facility-administered encounter medications on file as of 08/09/2019.    Allergies as of 08/09/2019  . (No Known Allergies)    Past Medical History:  Diagnosis Date  . Atrial septal defect    repaired age 38  . GERD (gastroesophageal reflux disease)   . Headache    migraines until age 58- 25, have them 3-4 x year  . Hypertension   . Vitamin D deficiency     Past Surgical History:  Procedure Laterality Date  . ASD REPAIR     age 62    Family History  Problem Relation Age of Onset  . Dementia Mother   . Heart attack Father   . Hypertension Father     Social History   Socioeconomic History  . Marital status: Married    Spouse name: [name]  . Number of children: 3  . Years of education: 57  . Highest education level: Not on file  Occupational History  . Occupation: Pharmacist, hospital    Comment: retired  Tobacco Use  . Smoking status: Never Smoker  . Smokeless tobacco: Never Used  Substance and Sexual Activity  . Alcohol use: No  . Drug use: No  . Sexual activity: Not on file  Other Topics Concern  . Not on file  Social History Narrative  Lives at home with husband   Caffeine use- green tea 1-2 glasses a day   Social Determinants of Health   Financial Resource Strain:   . Difficulty of Paying Living Expenses: Not on file  Food Insecurity:   . Worried About Charity fundraiser in the Last Year: Not on file  . Ran Out of Food in the Last Year: Not on file  Transportation Needs:   . Lack of Transportation (Medical): Not on file  . Lack of Transportation (Non-Medical): Not on file  Physical Activity:   . Days of Exercise per Week: Not on file  . Minutes of Exercise per Session: Not on file  Stress:   . Feeling of Stress : Not on file  Social Connections:   .  Frequency of Communication with Friends and Family: Not on file  . Frequency of Social Gatherings with Friends and Family: Not on file  . Attends Religious Services: Not on file  . Active Member of Clubs or Organizations: Not on file  . Attends Archivist Meetings: Not on file  . Marital Status: Not on file  Intimate Partner Violence:   . Fear of Current or Ex-Partner: Not on file  . Emotionally Abused: Not on file  . Physically Abused: Not on file  . Sexually Abused: Not on file    Review of systems: Review of Systems  Constitutional: Negative for fever and chills.  HENT: Negative.   Eyes: Negative for blurred vision.  Respiratory: as per HPI  Cardiovascular: Negative for chest pain and palpitations.  Gastrointestinal: Negative for vomiting, diarrhea, blood per rectum. Genitourinary: Negative for dysuria, urgency, frequency and hematuria.  Musculoskeletal: Negative for myalgias, back pain and joint pain.  Skin: Negative for itching and rash.  Neurological: Negative for dizziness, tremors, focal weakness, seizures and loss of consciousness.  Endo/Heme/Allergies: Negative for environmental allergies.  Psychiatric/Behavioral: Negative for depression, suicidal ideas and hallucinations.  All other systems reviewed and are negative.  Physical Exam: Blood pressure 120/80, pulse 76, temperature 97.9 F (36.6 C), temperature source Temporal, height 5' (1.524 m), weight 163 lb 9.6 oz (74.2 kg), SpO2 96 %. Gen:      No acute distress HEENT:  EOMI, sclera anicteric Neck:     No masses; no thyromegaly Lungs:    Clear to auscultation bilaterally; normal respiratory effort CV:         Regular rate and rhythm; no murmurs Abd:      + bowel sounds; soft, non-tender; no palpable masses, no distension Ext:    No edema; adequate peripheral perfusion Skin:      Warm and dry; no rash Neuro: alert and oriented x 3 Psych: normal mood and affect  Data Reviewed: Imaging: Chest x-ray  06/28/2019-streaky subpleural opacities bilaterally. Chest x-ray 07/03/2019-mild increase in bilateral airspace opacities. I have reviewed the images personally.   Assessment:  Post COVID-19 pneumonia She is recovering slowly from her admission but still has persistent cough Her Covid pneumonia appears to be mild on chest x-ray and clinical presentation We will get a repeat chest x-ray today to reevaluate and pulmonary function test If abnormal then consider high-resolution CT for post COVID-19 fibrosis.  She did not desat on walk test today. Continue Robitussin for chronic cough.  Add Best boy  Plan/Recommendations: - Chest x-ray, pulmonary function test - Robitussin, Tessalon Perles  Marshell Garfinkel MD [facility] 08/09/2019, 10:32 AM  CC: Bonnielee Haff, MD

## 2019-09-04 DIAGNOSIS — U071 COVID-19: Secondary | ICD-10-CM | POA: Diagnosis not present

## 2019-09-05 DIAGNOSIS — E669 Obesity, unspecified: Secondary | ICD-10-CM | POA: Diagnosis not present

## 2019-09-05 DIAGNOSIS — I10 Essential (primary) hypertension: Secondary | ICD-10-CM | POA: Diagnosis not present

## 2019-09-05 DIAGNOSIS — N179 Acute kidney failure, unspecified: Secondary | ICD-10-CM | POA: Diagnosis not present

## 2019-09-05 DIAGNOSIS — R05 Cough: Secondary | ICD-10-CM | POA: Diagnosis not present

## 2019-09-05 DIAGNOSIS — Z1331 Encounter for screening for depression: Secondary | ICD-10-CM | POA: Diagnosis not present

## 2019-09-05 DIAGNOSIS — R0602 Shortness of breath: Secondary | ICD-10-CM | POA: Diagnosis not present

## 2019-10-02 DIAGNOSIS — U071 COVID-19: Secondary | ICD-10-CM | POA: Diagnosis not present

## 2019-10-03 DIAGNOSIS — N179 Acute kidney failure, unspecified: Secondary | ICD-10-CM | POA: Diagnosis not present

## 2019-10-03 DIAGNOSIS — R05 Cough: Secondary | ICD-10-CM | POA: Diagnosis not present

## 2019-10-03 DIAGNOSIS — U071 COVID-19: Secondary | ICD-10-CM | POA: Diagnosis not present

## 2019-10-03 DIAGNOSIS — R0602 Shortness of breath: Secondary | ICD-10-CM | POA: Diagnosis not present

## 2019-10-03 DIAGNOSIS — E669 Obesity, unspecified: Secondary | ICD-10-CM | POA: Diagnosis not present

## 2019-10-03 DIAGNOSIS — I10 Essential (primary) hypertension: Secondary | ICD-10-CM | POA: Diagnosis not present

## 2019-10-03 DIAGNOSIS — M545 Low back pain: Secondary | ICD-10-CM | POA: Diagnosis not present

## 2019-10-03 DIAGNOSIS — R351 Nocturia: Secondary | ICD-10-CM | POA: Diagnosis not present

## 2019-10-09 DIAGNOSIS — H401131 Primary open-angle glaucoma, bilateral, mild stage: Secondary | ICD-10-CM | POA: Diagnosis not present

## 2019-10-09 DIAGNOSIS — H04123 Dry eye syndrome of bilateral lacrimal glands: Secondary | ICD-10-CM | POA: Diagnosis not present

## 2019-10-30 DIAGNOSIS — I10 Essential (primary) hypertension: Secondary | ICD-10-CM | POA: Diagnosis not present

## 2019-10-30 DIAGNOSIS — N6321 Unspecified lump in the left breast, upper outer quadrant: Secondary | ICD-10-CM | POA: Diagnosis not present

## 2019-10-31 ENCOUNTER — Other Ambulatory Visit: Payer: Self-pay | Admitting: Family Medicine

## 2019-10-31 DIAGNOSIS — N6320 Unspecified lump in the left breast, unspecified quadrant: Secondary | ICD-10-CM

## 2019-11-02 DIAGNOSIS — U071 COVID-19: Secondary | ICD-10-CM | POA: Diagnosis not present

## 2019-11-07 ENCOUNTER — Ambulatory Visit: Payer: Medicare HMO | Admitting: Primary Care

## 2019-11-07 ENCOUNTER — Encounter: Payer: Medicare HMO | Admitting: Primary Care

## 2019-11-09 ENCOUNTER — Ambulatory Visit
Admission: RE | Admit: 2019-11-09 | Discharge: 2019-11-09 | Disposition: A | Discharge status: Home / Self Care | Payer: Medicare HMO | Source: Ambulatory Visit | Attending: Family Medicine | Admitting: Family Medicine

## 2019-11-09 ENCOUNTER — Other Ambulatory Visit: Payer: Self-pay | Admitting: Family Medicine

## 2019-11-09 ENCOUNTER — Other Ambulatory Visit: Payer: Self-pay

## 2019-11-09 DIAGNOSIS — R922 Inconclusive mammogram: Secondary | ICD-10-CM | POA: Diagnosis not present

## 2019-11-09 DIAGNOSIS — R599 Enlarged lymph nodes, unspecified: Secondary | ICD-10-CM

## 2019-11-09 DIAGNOSIS — N6320 Unspecified lump in the left breast, unspecified quadrant: Secondary | ICD-10-CM

## 2019-11-09 DIAGNOSIS — N6321 Unspecified lump in the left breast, upper outer quadrant: Secondary | ICD-10-CM | POA: Diagnosis not present

## 2019-11-10 ENCOUNTER — Telehealth: Payer: Self-pay | Admitting: Pulmonary Disease

## 2019-11-10 ENCOUNTER — Ambulatory Visit: Payer: Medicare HMO | Attending: Internal Medicine

## 2019-11-10 DIAGNOSIS — Z23 Encounter for immunization: Secondary | ICD-10-CM

## 2019-11-10 NOTE — Telephone Encounter (Signed)
Pt returned call and stated that her questions were answered when she went to receive her covid vaccine. Nothing further needed.

## 2019-11-10 NOTE — Telephone Encounter (Signed)
Attempted to call pt but unable to reach. Left message for pt to return call. 

## 2019-11-10 NOTE — Progress Notes (Signed)
   Covid-19 Vaccination Clinic  Name:  Cynthia Vasquez    MRN: [id number] DOB: 02/12/1946  11/10/2019  [name] was observed post Covid-19 immunization for 15 minutes without incident. She was provided with Vaccine Information Sheet and instruction to access the V-Safe system.   [name] was instructed to call 911 with any severe reactions post vaccine: Marland Kitchen Difficulty breathing  . Swelling of face and throat  . A fast heartbeat  . A bad rash all over body  . Dizziness and weakness   Immunizations Administered    Name Date Dose VIS Date Route   Pfizer COVID-19 Vaccine 11/10/2019 10:46 AM 0.3 mL 07/14/2019 Intramuscular   Manufacturer: [employer]   Lot: [id number]   [place]: [id number]

## 2019-11-11 ENCOUNTER — Other Ambulatory Visit (HOSPITAL_COMMUNITY)
Admission: RE | Admit: 2019-11-11 | Discharge: 2019-11-11 | Disposition: A | Discharge status: Home / Self Care | Payer: Medicare HMO | Source: Ambulatory Visit | Attending: Primary Care | Admitting: Primary Care

## 2019-11-11 DIAGNOSIS — Z01812 Encounter for preprocedural laboratory examination: Secondary | ICD-10-CM | POA: Diagnosis not present

## 2019-11-11 DIAGNOSIS — Z20822 Contact with and (suspected) exposure to covid-19: Secondary | ICD-10-CM | POA: Insufficient documentation

## 2019-11-11 LAB — SARS CORONAVIRUS 2 (TAT 6-24 HRS): SARS Coronavirus 2: NEGATIVE

## 2019-11-14 ENCOUNTER — Ambulatory Visit: Payer: Medicare HMO | Admitting: Primary Care

## 2019-11-14 NOTE — Progress Notes (Signed)
@Patient  ID: Cynthia Vasquez, female    DOB: 03-18-1946, 74 y.o.   MRN: [id number]  No chief complaint on file.   Referring provider: Sueanne Margarita, DO  HPI:  74 year old female never smoker initially referred to our office in January/2021 for post Covid pneumonia  PMH: Hypertension, GERD Smoker/ Smoking History: Never smoker Maintenance:  none Pt of: Dr. Vaughan Browner  11/15/2019  - Visit   74 year old female never smoker initially referred to our office in January/2021 for post Covid pneumonia.  At last office visit patient was encouraged to have an x-ray, cough meds, is get scheduled for pulmonary function testing.  Patient presenting today for pulmonary function test.  Pulmonary function testing results are listed below:  11/15/2019-pulmonary function test-FVC 1.96 (77% predicted), postbronchodilator ratio 88, postbronchodilator FEV1 1.71 (90% predicted), no bronchodilator response, DLCO 16.37 (94% predicted)  Patient reporting today that overall she is doing well with no acute issues.  She still has an occasional dry cough.  Patient feels this is due to her losartan because this is what someone has told her.  She admits that she does have daily breakthrough reflux.  She is trying to "wean herself off of the acid reflux medication "right now.  She reports she is taking her PPI every other day.  She does feel that Tessalon helps with her cough.  She is having nasal congestion.  She is not currently taking any sort of allergy maintenance meds.  Patient has received her Covid vaccines.  She is frustrated by ongoing masking requirements.  Questionaires / Pulmonary Flowsheets:   MMRC: mMRC Dyspnea Scale mMRC Score  11/15/2019 0  08/09/2019 3   Tests:   Chest x-ray 06/28/2019-streaky subpleural opacities bilaterally. Chest x-ray 07/03/2019-mild increase in bilateral airspace opacities.  08/09/2019-chest x-ray-improved pulmonary infiltrates, subsegmental atelectasis  lingular  11/15/2019-pulmonary function test-FVC 1.96 (77% predicted), postbronchodilator ratio 88, postbronchodilator FEV1 1.71 (90% predicted), no bronchodilator response, DLCO 16.37 (94% predicted)   FENO:  No results found for: NITRICOXIDE  PFT: PFT Results Latest Ref Rng & Units 11/15/2019  FVC-Pre L 1.96  FVC-Predicted Pre % 77  FVC-Post L 1.95  FVC-Predicted Post % 77  Pre FEV1/FVC % % 83  Post FEV1/FCV % % 88  FEV1-Pre L 1.63  FEV1-Predicted Pre % 85  FEV1-Post L 1.71  DLCO UNC% % 94  DLCO COR %Predicted % 113  TLC L 3.94  TLC % Predicted % 85  RV % Predicted % 87    WALK:  SIX MIN WALK 08/09/2019  Supplimental Oxygen during Test? (L/min) No  Tech Comments: Pt. walked at a steady pace with minimal sob. She did state she was tired. ER    Imaging: US BREAST LTD UNI LEFT INC AXILLA  Result Date: 11/09/2019 CLINICAL DATA:  Palpable mass associated dimpling in the LEFT breast noted 2 weeks ago. Patient reports associated tenderness and pinching pain. Patient is scheduled for her first Covid-19 vaccine next week. EXAM: DIGITAL DIAGNOSTIC BILATERAL MAMMOGRAM WITH CAD AND TOMO ULTRASOUND LEFT BREAST COMPARISON:  12/23/2016 ACR Breast Density Category c: The breast tissue is heterogeneously dense, which may obscure small masses. FINDINGS: RIGHT breast is negative. There is an irregular mass with spiculated margins the UPPER-OUTER QUADRANT of the LEFT breast and marked as palpable with a BB. Mammographic images were processed with CAD. On physical exam, I palpate a firm mass in the UPPER-OUTER QUADRANT of the LEFT breast. I palpate a firm mass in the LOWER LEFT axilla. Targeted ultrasound is performed, showing irregular  mass in the 2:30 o'clock location of the LEFT breast 8 centimeters from the nipple corresponding to the palpable abnormality. Mass measures 1.9 x 1.3 x 2.9 centimeters. An adjacent hypoechoic oval mass is 0.3 x 0.4 x 1.0 centimeters. Measured together, masses are 1.9 x 1.8  x 2.9 centimeters. On Doppler evaluation there is associated internal blood flow. Evaluation of the LEFT axilla shows 3 enlarged lymph nodes which lack fatty hila. The largest is the LOWER most lymph node and measures 2.1 x 1.5 x 2.1 centimeters. A second is 1.3 x 1.1 x 1.8 centimeters. A third node is 1.9 x 1.2 x 1.3 centimeters. Additional lymph nodes with normal morphology are also imaged. IMPRESSION: 1. Palpable mass in the 2:30 o'clock location of the LEFT breast is suspicious for malignancy and biopsy is recommended. 2. Three enlarged LEFT axillary lymph nodes. RECOMMENDATION: 1. Ultrasound-guided core biopsy mass in the 2:30 o'clock location of the LEFT breast. Recommend biopsy of adjacent masses as a single lesion. 2. Ultrasound-guided core biopsy of 1 of the enlarged LEFT axillary lymph nodes. 3. Biopsies will be scheduled for the patient. I have discussed the findings and recommendations with the patient. If applicable, a reminder letter will be sent to the patient regarding the next appointment. BI-RADS CATEGORY  5: Highly suggestive of malignancy. Electronically Signed   By: Nolon Nations M.D.   On: 11/09/2019 14:22   MM DIAG BREAST TOMO BILATERAL  Result Date: 11/09/2019 CLINICAL DATA:  Palpable mass associated dimpling in the LEFT breast noted 2 weeks ago. Patient reports associated tenderness and pinching pain. Patient is scheduled for her first Covid-19 vaccine next week. EXAM: DIGITAL DIAGNOSTIC BILATERAL MAMMOGRAM WITH CAD AND TOMO ULTRASOUND LEFT BREAST COMPARISON:  12/23/2016 ACR Breast Density Category c: The breast tissue is heterogeneously dense, which may obscure small masses. FINDINGS: RIGHT breast is negative. There is an irregular mass with spiculated margins the UPPER-OUTER QUADRANT of the LEFT breast and marked as palpable with a BB. Mammographic images were processed with CAD. On physical exam, I palpate a firm mass in the UPPER-OUTER QUADRANT of the LEFT breast. I palpate a firm  mass in the LOWER LEFT axilla. Targeted ultrasound is performed, showing irregular mass in the 2:30 o'clock location of the LEFT breast 8 centimeters from the nipple corresponding to the palpable abnormality. Mass measures 1.9 x 1.3 x 2.9 centimeters. An adjacent hypoechoic oval mass is 0.3 x 0.4 x 1.0 centimeters. Measured together, masses are 1.9 x 1.8 x 2.9 centimeters. On Doppler evaluation there is associated internal blood flow. Evaluation of the LEFT axilla shows 3 enlarged lymph nodes which lack fatty hila. The largest is the LOWER most lymph node and measures 2.1 x 1.5 x 2.1 centimeters. A second is 1.3 x 1.1 x 1.8 centimeters. A third node is 1.9 x 1.2 x 1.3 centimeters. Additional lymph nodes with normal morphology are also imaged. IMPRESSION: 1. Palpable mass in the 2:30 o'clock location of the LEFT breast is suspicious for malignancy and biopsy is recommended. 2. Three enlarged LEFT axillary lymph nodes. RECOMMENDATION: 1. Ultrasound-guided core biopsy mass in the 2:30 o'clock location of the LEFT breast. Recommend biopsy of adjacent masses as a single lesion. 2. Ultrasound-guided core biopsy of 1 of the enlarged LEFT axillary lymph nodes. 3. Biopsies will be scheduled for the patient. I have discussed the findings and recommendations with the patient. If applicable, a reminder letter will be sent to the patient regarding the next appointment. BI-RADS CATEGORY  5: Highly suggestive of  malignancy. Electronically Signed   By: Nolon Nations M.D.   On: 11/09/2019 14:22    Lab Results:  CBC    Component Value Date/Time   WBC 9.5 07/04/2019 0333   RBC 4.37 07/04/2019 0333   HGB 12.8 07/04/2019 0333   HCT 38.9 07/04/2019 0333   PLT 346 07/04/2019 0333   MCV 89.0 07/04/2019 0333   MCH 29.3 07/04/2019 0333   MCHC 32.9 07/04/2019 0333   RDW 13.1 07/04/2019 0333   LYMPHSABS 1.3 06/28/2019 1959   MONOABS 0.6 06/28/2019 1959   EOSABS 0.0 06/28/2019 1959   BASOSABS 0.0 06/28/2019 1959     BMET    Component Value Date/Time   NA 139 07/04/2019 0333   K 4.6 07/04/2019 0333   CL 101 07/04/2019 0333   CO2 27 07/04/2019 0333   GLUCOSE 116 (H) 07/04/2019 0333   BUN 34 (H) 07/04/2019 0333   CREATININE 1.03 (H) 07/04/2019 0333   CALCIUM 9.3 07/04/2019 0333   GFRNONAA 54 (L) 07/04/2019 0333   GFRAA >60 07/04/2019 0333    BNP No results found for: BNP  ProBNP No results found for: PROBNP  Specialty Problems      Pulmonary Problems   Acute respiratory failure due to COVID-19 [facility])   Acute respiratory failure with hypoxia (HCC)   Cough      No Known Allergies  Immunization History  Administered Date(s) Administered   Influenza, High Dose Seasonal PF 06/09/2019   PFIZER SARS-COV-2 Vaccination 11/10/2019   Pneumococcal Polysaccharide-23 06/08/2017    Past Medical History:  Diagnosis Date   Atrial septal defect    repaired age 79   GERD (gastroesophageal reflux disease)    Headache    migraines until age 89- 32, have them 3-4 x year   Hypertension    Vitamin D deficiency     Tobacco History: Social History   Tobacco Use  Smoking Status Never Smoker  Smokeless Tobacco Never Used   Counseling given: Not Answered   Continue to not smoke  Outpatient Encounter Medications as of 11/15/2019  Medication Sig   acetaminophen (TYLENOL) 500 MG tablet Take 500 mg by mouth every 6 (six) hours as needed for mild pain.    cholecalciferol (VITAMIN D) 1000 UNITS tablet Take 1,000 Units by mouth daily.   hydrochlorothiazide (HYDRODIURIL) 25 MG tablet Take 1 tablet by mouth daily.   latanoprost (XALATAN) 0.005 % ophthalmic solution Place 1 drop into both eyes at bedtime.   losartan (COZAAR) 50 MG tablet Take 1 tablet by mouth daily.   Multiple Vitamins-Minerals (MULTIVITAMIN WITH MINERALS) tablet Take 1 tablet by mouth daily.   pantoprazole (PROTONIX) 40 MG tablet Take 40 mg by mouth daily as needed (heart burn).    benzonatate (TESSALON) 100 MG  capsule Take 1 capsule (100 mg total) by mouth 2 (two) times daily as needed for cough.   [DISCONTINUED] albuterol (VENTOLIN HFA) 108 (90 Base) MCG/ACT inhaler Inhale 2 puffs into the lungs every 6 (six) hours as needed for wheezing or shortness of breath.   [DISCONTINUED] Ascorbic Acid (VITAMIN C) 1000 MG tablet Take 1,000 mg by mouth daily.   [DISCONTINUED] benzonatate (TESSALON) 100 MG capsule Take 1 capsule (100 mg total) by mouth 2 (two) times daily as needed for cough.   [DISCONTINUED] zinc gluconate 50 MG tablet Take 50 mg by mouth daily.   No facility-administered encounter medications on file as of 11/15/2019.     Review of Systems  Review of Systems  Constitutional: Negative for  activity change, fatigue and fever.  HENT: Positive for congestion and postnasal drip. Negative for sinus pressure, sinus pain and sore throat.   Respiratory: Positive for cough. Negative for shortness of breath and wheezing.   Cardiovascular: Negative for chest pain and palpitations.  Gastrointestinal: Negative for diarrhea, nausea and vomiting.       Breakthrough reflux often multiple times a week  Musculoskeletal: Negative for arthralgias.  Neurological: Negative for dizziness.  Psychiatric/Behavioral: Negative for sleep disturbance. The patient is not nervous/anxious.      Physical Exam  BP 124/62    Pulse 78    Temp (!) 97.1 F (36.2 C) (Temporal)    Ht 5\' 1"  (1.549 m)    Wt 166 lb (75.3 kg)    SpO2 100% Comment: on RA   BMI 31.37 kg/m   Wt Readings from Last 5 Encounters:  11/15/19 166 lb (75.3 kg)  08/09/19 163 lb 9.6 oz (74.2 kg)  07/04/19 153 lb 6.4 oz (69.6 kg)  03/08/15 170 lb 12.8 oz (77.5 kg)  06/11/13 177 lb 11.1 oz (80.6 kg)    BMI Readings from Last 5 Encounters:  11/15/19 31.37 kg/m  08/09/19 31.95 kg/m  07/04/19 29.96 kg/m  03/08/15 33.36 kg/m  06/11/13 33.57 kg/m     Physical Exam Vitals and nursing note reviewed.  Constitutional:      General: She is not  in acute distress.    Appearance: Normal appearance. She is obese.  HENT:     Head: Normocephalic and atraumatic.     Right Ear: Tympanic membrane, ear canal and external ear normal. There is no impacted cerumen.     Left Ear: Tympanic membrane, ear canal and external ear normal. There is no impacted cerumen.     Nose: Congestion and rhinorrhea present.     Mouth/Throat:     Mouth: Mucous membranes are moist.     Pharynx: Oropharynx is clear.     Comments: +PND Eyes:     Pupils: Pupils are equal, round, and reactive to light.  Cardiovascular:     Rate and Rhythm: Normal rate and regular rhythm.     Pulses: Normal pulses.     Heart sounds: Normal heart sounds. No murmur.  Pulmonary:     Effort: Pulmonary effort is normal. No respiratory distress.     Breath sounds: Normal breath sounds. No decreased air movement. No decreased breath sounds, wheezing or rales.     Comments: Coughs during visit  Musculoskeletal:     Cervical back: Normal range of motion.  Skin:    General: Skin is warm and dry.     Capillary Refill: Capillary refill takes less than 2 seconds.  Neurological:     General: No focal deficit present.     Mental Status: She is alert and oriented to person, place, and time. Mental status is at baseline.     Gait: Gait normal.  Psychiatric:        Mood and Affect: Mood normal.        Behavior: Behavior normal.        Thought Content: Thought content normal.        Judgment: Judgment normal.       Assessment & Plan:   Acute respiratory failure due to COVID-19 [facility]) Reviewed pulmonary function test today Patient reporting the symptoms are improving Still having dry cough, this is likely multifactorial given noncompliance with GERD and allergic rhinitis regimen  Plan:  chest x-ray today Continue to maintain physical distancing,  wearing a mask and handwashing   GERD (gastroesophageal reflux disease) Persistent daily reflux Noncompliance with  PPI  Plan: Resume PPI use Follow-up with primary care regarding your reflux Follow GERD diet and lifestyle recommendations on AVS  Cough Patient's cough is multifactorial given she status post Covid pneumonia, noncompliant with acid reflux controller meds, active acid reflux symptoms, AR flare on exam today  Plan: Resume PPI use Start taking daily antihistamine Refilled Tessalon     Return in about 6 months (around 05/16/2020), or if symptoms worsen or fail to improve, for Follow up with Dr. Vaughan Browner.   Lauraine Rinne, NP 11/15/2019   This appointment required 32 minutes of patient care (this includes precharting, chart review, review of results, face-to-face care, etc.).

## 2019-11-15 ENCOUNTER — Ambulatory Visit (INDEPENDENT_AMBULATORY_CARE_PROVIDER_SITE_OTHER): Payer: Medicare HMO

## 2019-11-15 ENCOUNTER — Encounter: Payer: Self-pay | Admitting: Pulmonary Disease

## 2019-11-15 ENCOUNTER — Other Ambulatory Visit: Payer: Self-pay

## 2019-11-15 ENCOUNTER — Ambulatory Visit (INDEPENDENT_AMBULATORY_CARE_PROVIDER_SITE_OTHER): Payer: Medicare HMO | Admitting: Pulmonary Disease

## 2019-11-15 VITALS — BP 124/62 | HR 78 | Temp 97.1°F | Ht 61.0 in | Wt 166.0 lb

## 2019-11-15 DIAGNOSIS — R059 Cough, unspecified: Secondary | ICD-10-CM

## 2019-11-15 DIAGNOSIS — U071 COVID-19: Secondary | ICD-10-CM

## 2019-11-15 DIAGNOSIS — R05 Cough: Secondary | ICD-10-CM | POA: Diagnosis not present

## 2019-11-15 DIAGNOSIS — J9600 Acute respiratory failure, unspecified whether with hypoxia or hypercapnia: Secondary | ICD-10-CM

## 2019-11-15 DIAGNOSIS — K219 Gastro-esophageal reflux disease without esophagitis: Secondary | ICD-10-CM

## 2019-11-15 LAB — PULMONARY FUNCTION TEST
DL/VA % pred: 113 %
DL/VA: 4.79 ml/min/mmHg/L
DLCO cor % pred: 94 %
DLCO cor: 16.37 ml/min/mmHg
DLCO unc % pred: 94 %
DLCO unc: 16.37 ml/min/mmHg
FEF 25-75 Post: 2.08 L/sec
FEF 25-75 Pre: 1.73 L/sec
FEF2575-%Change-Post: 20 %
FEF2575-%Pred-Post: 131 %
FEF2575-%Pred-Pre: 108 %
FEV1-%Change-Post: 5 %
FEV1-%Pred-Post: 90 %
FEV1-%Pred-Pre: 85 %
FEV1-Post: 1.71 L
FEV1-Pre: 1.63 L
FEV1FVC-%Change-Post: 5 %
FEV1FVC-%Pred-Pre: 110 %
FEV6-%Change-Post: 2 %
FEV6-%Pred-Post: 81 %
FEV6-%Pred-Pre: 79 %
FEV6-Post: 1.95 L
FEV6-Pre: 1.91 L
FEV6FVC-%Pred-Post: 105 %
FEV6FVC-%Pred-Pre: 105 %
FVC-%Change-Post: 0 %
FVC-%Pred-Post: 77 %
FVC-%Pred-Pre: 77 %
FVC-Post: 1.95 L
FVC-Pre: 1.96 L
Post FEV1/FVC ratio: 88 %
Post FEV6/FVC ratio: 100 %
Pre FEV1/FVC ratio: 83 %
Pre FEV6/FVC Ratio: 100 %
RV % pred: 87 %
RV: 1.83 L
TLC % pred: 85 %
TLC: 3.94 L

## 2019-11-15 MED ORDER — BENZONATATE 100 MG PO CAPS: 100.0000 mg | ORAL_CAPSULE | Freq: Two times a day (BID) | ORAL | 1 refills | Status: DC | PRN

## 2019-11-15 NOTE — Progress Notes (Signed)
PFT done today. 

## 2019-11-15 NOTE — Assessment & Plan Note (Signed)
Reviewed pulmonary function test today Patient reporting the symptoms are improving Still having dry cough, this is likely multifactorial given noncompliance with GERD and allergic rhinitis regimen  Plan:  chest x-ray today Continue to maintain physical distancing, wearing a mask and handwashing

## 2019-11-15 NOTE — Assessment & Plan Note (Signed)
Patient's cough is multifactorial given she status post Covid pneumonia, noncompliant with acid reflux controller meds, active acid reflux symptoms, AR flare on exam today  Plan: Resume PPI use Start taking daily antihistamine Refilled Tessalon

## 2019-11-15 NOTE — Patient Instructions (Addendum)
You were seen today by Lauraine Rinne, NP  for:   1. Cough  - benzonatate (TESSALON) 100 MG capsule; Take 1 capsule (100 mg total) by mouth 2 (two) times daily as needed for cough.  Dispense: 30 capsule; Refill: 1  Please resume taking your acid reflux medication Work on your diet in regards to acid reflux as well as your timing follow the instructions listed below  Please start taking a daily antihistamine:  >>>choose one of: zyrtec, claritin, allegra, or xyzal  >>>these are over the counter medications  >>>can choose generic option  >>>take daily  >>>this medication helps with allergies, post nasal drip, and cough   2. Acute respiratory failure due to COVID-19 [facility])  - DG Chest 2 View; Future  3. Gastroesophageal reflux disease, unspecified whether esophagitis present  Protonix 40 mg tablet  >>>Please take 1 tablet daily 15 minutes to 30 minutes before your first meal of the day as well as before your other medications >>>Try to take at the same time each day >>>take this medication daily  GERD management: >>>Avoid laying flat until 2 hours after meals >>>Elevate head of the bed including entire chest >>>Reduce size of meals and amount of fat, acid, spices, caffeine and sweets >>>If you are smoking, Please stop! >>>Decrease alcohol consumption >>>Work on maintaining a healthy weight with normal BMI      We recommend today:  Orders Placed This Encounter  Procedures  . DG Chest 2 View    Standing Status:   Future    Standing Expiration Date:   01/14/2021    Order Specific Question:   Reason for Exam (SYMPTOM  OR DIAGNOSIS REQUIRED)    Answer:   cough, post covid    Order Specific Question:   Preferred imaging location?    Answer:   Internal    Order Specific Question:   Radiology Contrast Protocol - do NOT remove file path    Answer:   [email or web address]   Orders Placed This Encounter  Procedures  . DG Chest 2 View   Meds  ordered this encounter  Medications  . benzonatate (TESSALON) 100 MG capsule    Sig: Take 1 capsule (100 mg total) by mouth 2 (two) times daily as needed for cough.    Dispense:  30 capsule    Refill:  1    Follow Up:    Return in about 6 months (around 05/16/2020), or if symptoms worsen or fail to improve, for Follow up with Dr. Vaughan Browner.   Please do your part to reduce the spread of COVID-19:      Reduce your risk of any infection  and COVID19 by using the similar precautions used for avoiding the common cold or flu:  Marland Kitchen Wash your hands often with soap and warm water for at least 20 seconds.  If soap and water are not readily available, use an alcohol-based hand sanitizer with at least 60% alcohol.  . If coughing or sneezing, cover your mouth and nose by coughing or sneezing into the elbow areas of your shirt or coat, into a tissue or into your sleeve (not your hands). Langley Gauss A MASK when in public  . Avoid shaking hands with others and consider head nods or verbal greetings only. . Avoid touching your eyes, nose, or mouth with unwashed hands.  . Avoid close contact with people who are sick. . Avoid places or events with large numbers of people in one location, like concerts or sporting events. . If  you have some symptoms but not all symptoms, continue to monitor at home and seek medical attention if your symptoms worsen. . If you are having a medical emergency, call 911.   [place] / e-Visit: [email or web address]         [facility]: [facility]: [id number]                   [facility]: [id number]     It is flu season:   >>> Best ways to protect herself from the flu: Receive the yearly flu vaccine, practice good hand hygiene washing with soap and also using hand sanitizer when available, eat a nutritious meals, get  adequate rest, hydrate appropriately   Please contact the office if your symptoms worsen or you have concerns that you are not improving.   Thank you for choosing [facility] for your healthcare, and for allowing [place] to partner with you on your healthcare journey. I am thankful to be able to provide care to you today.   Cynthia Quaker FNP-C     Gastroesophageal Reflux Disease, Adult Gastroesophageal reflux (GER) happens when acid from the stomach flows up into the tube that connects the mouth and the stomach (esophagus). Normally, food travels down the esophagus and stays in the stomach to be digested. With GER, food and stomach acid sometimes move back up into the esophagus. You may have a disease called gastroesophageal reflux disease (GERD) if the reflux:  Happens often.  Causes frequent or very bad symptoms.  Causes problems such as damage to the esophagus. When this happens, the esophagus becomes sore and swollen (inflamed). Over time, GERD can make small holes (ulcers) in the lining of the esophagus. What are the causes? This condition is caused by a problem with the muscle between the esophagus and the stomach. When this muscle is weak or not normal, it does not close properly to keep food and acid from coming back up from the stomach. The muscle can be weak because of:  Tobacco use.  Pregnancy.  Having a certain type of hernia (hiatal hernia).  Alcohol use.  Certain foods and drinks, such as coffee, chocolate, onions, and peppermint. What increases the risk? You are more likely to develop this condition if you:  Are overweight.  Have a disease that affects your connective tissue.  Use NSAID medicines. What are the signs or symptoms? Symptoms of this condition include:  Heartburn.  Difficult or painful swallowing.  The feeling of having a lump in the throat.  A bitter taste in the mouth.  Bad breath.  Having a lot of saliva.  Having an upset or  bloated stomach.  Belching.  Chest pain. Different conditions can cause chest pain. Make sure you see your doctor if you have chest pain.  Shortness of breath or noisy breathing (wheezing).  Ongoing (chronic) cough or a cough at night.  Wearing away of the surface of teeth (tooth enamel).  Weight loss. How is this treated? Treatment will depend on how bad your symptoms are. Your doctor may suggest:  Changes to your diet.  Medicine.  Surgery. Follow these instructions at home: Eating and drinking   Follow a diet as told by your doctor. You may need to avoid foods and drinks such as: ? Coffee and tea (with or without caffeine). ? Drinks that contain alcohol. ? Energy drinks and sports drinks. ? Bubbly (carbonated) drinks  or sodas. ? Chocolate and cocoa. ? Peppermint and mint flavorings. ? Garlic and onions. ? Horseradish. ? Spicy and acidic foods. These include peppers, chili powder, curry powder, vinegar, hot sauces, and BBQ sauce. ? Citrus fruit juices and citrus fruits, such as oranges, lemons, and limes. ? Tomato-based foods. These include red sauce, chili, salsa, and pizza with red sauce. ? Fried and fatty foods. These include donuts, french fries, potato chips, and high-fat dressings. ? High-fat meats. These include hot dogs, rib eye steak, sausage, ham, and bacon. ? High-fat dairy items, such as whole milk, butter, and cream cheese.  Eat small meals often. Avoid eating large meals.  Avoid drinking large amounts of liquid with your meals.  Avoid eating meals during the 2-3 hours before bedtime.  Avoid lying down right after you eat.  Do not exercise right after you eat. Lifestyle   Do not use any products that contain nicotine or tobacco. These include cigarettes, e-cigarettes, and chewing tobacco. If you need help quitting, ask your doctor.  Try to lower your stress. If you need help doing this, ask your doctor.  If you are overweight, lose an amount of  weight that is healthy for you. Ask your doctor about a safe weight loss goal. General instructions  Pay attention to any changes in your symptoms.  Take over-the-counter and prescription medicines only as told by your doctor. Do not take aspirin, ibuprofen, or other NSAIDs unless your doctor says it is okay.  Wear loose clothes. Do not wear anything tight around your waist.  Raise (elevate) the head of your bed about 6 inches (15 cm).  Avoid bending over if this makes your symptoms worse.  Keep all follow-up visits as told by your doctor. This is important. Contact a doctor if:  You have new symptoms.  You lose weight and you do not know why.  You have trouble swallowing or it hurts to swallow.  You have wheezing or a cough that keeps happening.  Your symptoms do not get better with treatment.  You have a hoarse voice. Get help right away if:  You have pain in your arms, neck, jaw, teeth, or back.  You feel sweaty, dizzy, or light-headed.  You have chest pain or shortness of breath.  You throw up (vomit) and your throw-up looks like blood or coffee grounds.  You pass out (faint).  Your poop (stool) is bloody or black.  You cannot swallow, drink, or eat. Summary  If a person has gastroesophageal reflux disease (GERD), food and stomach acid move back up into the esophagus and cause symptoms or problems such as damage to the esophagus.  Treatment will depend on how bad your symptoms are.  Follow a diet as told by your doctor.  Take all medicines only as told by your doctor. This information is not intended to replace advice given to you by your health care provider. Make sure you discuss any questions you have with your health care provider. Document Revised: 01/26/2018 Document Reviewed: 01/26/2018 Elsevier Patient Education  2020 [place] for Gastroesophageal Reflux Disease, Adult When you have gastroesophageal reflux disease (GERD), the  foods you eat and your eating habits are very important. Choosing the right foods can help ease your discomfort. Think about working with a nutrition specialist (dietitian) to help you make good choices. What are tips for following this plan?  Meals  Choose healthy foods that are low in fat, such as fruits, vegetables, whole  grains, low-fat dairy products, and lean meat, fish, and poultry.  Eat small meals often instead of 3 large meals a day. Eat your meals slowly, and in a place where you are relaxed. Avoid bending over or lying down until 2-3 hours after eating.  Avoid eating meals 2-3 hours before bed.  Avoid drinking a lot of liquid with meals.  Cook foods using methods other than frying. Bake, grill, or broil food instead.  Avoid or limit: ? Chocolate. ? Peppermint or spearmint. ? Alcohol. ? Pepper. ? Black and decaffeinated coffee. ? Black and decaffeinated tea. ? Bubbly (carbonated) soft drinks. ? Caffeinated energy drinks and soft drinks.  Limit high-fat foods such as: ? Fatty meat or fried foods. ? Whole milk, cream, butter, or ice cream. ? Nuts and nut butters. ? Pastries, donuts, and sweets made with butter or shortening.  Avoid foods that cause symptoms. These foods may be different for everyone. Common foods that cause symptoms include: ? Tomatoes. ? Oranges, lemons, and limes. ? Peppers. ? Spicy food. ? Onions and garlic. ? Vinegar. Lifestyle  Maintain a healthy weight. Ask your doctor what weight is healthy for you. If you need to lose weight, work with your doctor to do so safely.  Exercise for at least 30 minutes for 5 or more days each week, or as told by your doctor.  Wear loose-fitting clothes.  Do not smoke. If you need help quitting, ask your doctor.  Sleep with the head of your bed higher than your feet. Use a wedge under the mattress or blocks under the bed frame to raise the head of the bed. Summary  When you have gastroesophageal reflux  disease (GERD), food and lifestyle choices are very important in easing your symptoms.  Eat small meals often instead of 3 large meals a day. Eat your meals slowly, and in a place where you are relaxed.  Limit high-fat foods such as fatty meat or fried foods.  Avoid bending over or lying down until 2-3 hours after eating.  Avoid peppermint and spearmint, caffeine, alcohol, and chocolate. This information is not intended to replace advice given to you by your health care provider. Make sure you discuss any questions you have with your health care provider. Document Revised: 11/10/2018 Document Reviewed: 08/25/2016 Elsevier Patient Education  [place].

## 2019-11-15 NOTE — Assessment & Plan Note (Signed)
Persistent daily reflux Noncompliance with PPI  Plan: Resume PPI use Follow-up with primary care regarding your reflux Follow GERD diet and lifestyle recommendations on AVS

## 2019-11-16 ENCOUNTER — Inpatient Hospital Stay: Admission: RE | Admit: 2019-11-16 | Payer: Medicare HMO | Source: Ambulatory Visit

## 2019-11-16 ENCOUNTER — Other Ambulatory Visit: Payer: Medicare HMO

## 2019-11-16 ENCOUNTER — Other Ambulatory Visit: Payer: Self-pay | Admitting: Family Medicine

## 2019-11-16 DIAGNOSIS — N6320 Unspecified lump in the left breast, unspecified quadrant: Secondary | ICD-10-CM

## 2019-11-16 NOTE — Progress Notes (Signed)
Patient identification verified. Message from Cynthia Quaker FNP relayed.  Chest x ray from yesterday is clear, this is good news. There are no new recommendations.  Patient verbalized understanding of results.

## 2019-11-17 ENCOUNTER — Ambulatory Visit
Admission: RE | Admit: 2019-11-17 | Discharge: 2019-11-17 | Disposition: A | Discharge status: Home / Self Care | Payer: Medicare HMO | Source: Ambulatory Visit | Attending: Family Medicine | Admitting: Family Medicine

## 2019-11-17 ENCOUNTER — Other Ambulatory Visit: Payer: Self-pay

## 2019-11-17 DIAGNOSIS — N6321 Unspecified lump in the left breast, upper outer quadrant: Secondary | ICD-10-CM | POA: Diagnosis not present

## 2019-11-17 DIAGNOSIS — C773 Secondary and unspecified malignant neoplasm of axilla and upper limb lymph nodes: Secondary | ICD-10-CM | POA: Diagnosis not present

## 2019-11-17 DIAGNOSIS — R599 Enlarged lymph nodes, unspecified: Secondary | ICD-10-CM

## 2019-11-17 DIAGNOSIS — C50412 Malignant neoplasm of upper-outer quadrant of left female breast: Secondary | ICD-10-CM | POA: Diagnosis not present

## 2019-11-17 DIAGNOSIS — N6320 Unspecified lump in the left breast, unspecified quadrant: Secondary | ICD-10-CM

## 2019-11-17 DIAGNOSIS — R590 Localized enlarged lymph nodes: Secondary | ICD-10-CM | POA: Diagnosis not present

## 2019-11-21 ENCOUNTER — Encounter: Payer: Self-pay | Admitting: *Deleted

## 2019-11-24 ENCOUNTER — Encounter: Payer: Self-pay | Admitting: *Deleted

## 2019-11-24 DIAGNOSIS — Z170 Estrogen receptor positive status [ER+]: Secondary | ICD-10-CM | POA: Insufficient documentation

## 2019-11-24 DIAGNOSIS — C50412 Malignant neoplasm of upper-outer quadrant of left female breast: Secondary | ICD-10-CM | POA: Insufficient documentation

## 2019-11-28 NOTE — Progress Notes (Signed)
[place]  Telephone:(336) [phone] Fax:(336) [phone]     ID: KIELY COUSAR DOB: 03/20/1946  MR#: [id number]  AQT#:[id number]  Patient Care Team: Sueanne Margarita, DO as PCP - General (Internal Medicine) Rockwell Germany, RN as Oncology Nurse Navigator Mauro Kaufmann, RN as Oncology Nurse Navigator Mertha Clyatt, Virgie Dad, MD as Consulting Physician (Oncology) Alphonsa Overall, MD as Consulting Physician (General Surgery) Kyung Rudd, MD as Consulting Physician (Radiation Oncology) Chauncey Cruel, MD OTHER MD:  CHIEF COMPLAINT: Estrogen receptor positive breast cancer  CURRENT TREATMENT: Awaiting definitive surgery   HISTORY OF CURRENT ILLNESS: Nori herself palpated a left breast mass, with associated dimpling, tenderness, and pinching pain. She underwent bilateral diagnostic mammography with tomography and left breast ultrasonography at [facility] on 11/09/2019 showing: breast density category C; 2.9 cm irregular mass in left breast at 2:30 corresponding to palpable abnormality; three enlarged left axillary lymph nodes.  Accordingly on 11/17/2019 she proceeded to biopsy of the left breast area in question. The pathology from this procedure ([id number]) showed: invasive mammary carcinoma, grade 2, e-cadherin positive. Prognostic indicators significant for: estrogen receptor, 95% positive and progesterone receptor, 95% positive, both with strong staining intensity. Proliferation marker Ki67 at 30%. HER2 negative by immunohistochemistry (1+).  The biopsied lymph node was positive for metastatic carcinoma.  The patient's subsequent history is as detailed below.   INTERVAL HISTORY: Eltha was evaluated in the multidisciplinary breast cancer clinic on 11/29/2019 accompanied by her husband Joe. Her case was also presented at the multidisciplinary breast cancer conference on the same day. At that time a preliminary plan was proposed: Lumpectomy with axillary lymph node  dissection; MammaPrint if no more than 3 axillary lymph nodes are involved; antiestrogens, adjuvant radiation.   REVIEW OF SYSTEMS: The patient denies unusual headaches, visual changes, nausea, vomiting, stiff neck, dizziness, or gait imbalance. There has been no cough, phlegm production, or pleurisy, no chest pain or pressure, and no change in bowel or bladder habits. The patient denies fever, rash, bleeding, unexplained fatigue or unexplained weight loss.  Lashaye exercises by gardening.  A detailed review of systems was otherwise entirely negative.   PAST MEDICAL HISTORY: Past Medical History:  Diagnosis Date  . Atrial septal defect    repaired age 54  . GERD (gastroesophageal reflux disease)   . Headache    migraines until age 67- 15, have them 3-4 x year  . Hypertension   . Vitamin D deficiency     PAST SURGICAL HISTORY: Past Surgical History:  Procedure Laterality Date  . ASD REPAIR     age 35    FAMILY HISTORY: Family History  Problem Relation Age of Onset  . Dementia Mother   . Heart attack Father   . Hypertension Father   . Breast cancer Sister 68  The patient's father died at age 64 from a myocardial infarction.  The patient's mother died at 65 with Alzheimer's disease.  The patient has 2 brothers, 2 sisters.  1 sister developed breast cancer at the age of 65.  The patient's daughter has a history of thyroid cancer.  She has not been genetically tested.  There is no other history of cancer in the family to the patient's knowledge.   GYNECOLOGIC HISTORY:  No LMP recorded. Patient is postmenopausal. Menarche: 74 years old Age at first live birth: 74 years old [place] P 3 LMP age 54 Contraceptive approximately 2 years, with no complications HRT no  Hysterectomy?  No BSO?  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  She is now retired.  Her husband [name] (goes by [employer]") is also a retired Optometrist.  Son  [name] 35 lives in [place] and works for a company that processes [employer] for [employer], but he completed a masters in Chief Executive Officer and is looking for a different job; daughter [name] lives in [place] and is a Oncologist; son [name] lives in [place] and runs a business that puts and tennis on cell towers.  The patient has 8 grandchildren.  She attends our [place] [employer].    ADVANCED DIRECTIVES: In the absence of any documents to the contrary the patient's husband is her healthcare power of attorney   HEALTH MAINTENANCE: Social History   Tobacco Use  . Smoking status: Never Smoker  . Smokeless tobacco: Never Used  Substance Use Topics  . Alcohol use: No  . Drug use: No     Colonoscopy: "Less than 10 years ago"; [facility]  PAP: Remote  Bone density: Never   No Known Allergies  Current Outpatient Medications  Medication Sig Dispense Refill  . cholecalciferol (VITAMIN D) 1000 UNITS tablet Take 1,000 Units by mouth daily.    . hydrochlorothiazide (HYDRODIURIL) 25 MG tablet Take 1 tablet by mouth daily.    Marland Kitchen losartan (COZAAR) 50 MG tablet Take 1 tablet by mouth daily.    . Multiple Vitamins-Minerals (MULTIVITAMIN WITH MINERALS) tablet Take 1 tablet by mouth daily.    Marland Kitchen acetaminophen (TYLENOL) 500 MG tablet Take 500 mg by mouth every 6 (six) hours as needed for mild pain.     . benzonatate (TESSALON) 100 MG capsule Take 1 capsule (100 mg total) by mouth 2 (two) times daily as needed for cough. (Patient not taking: Reported on 11/29/2019) 30 capsule 1  . latanoprost (XALATAN) 0.005 % ophthalmic solution Place 1 drop into both eyes at bedtime.    . pantoprazole (PROTONIX) 40 MG tablet Take 40 mg by mouth daily as needed (heart burn).      No current facility-administered medications for this visit.    OBJECTIVE: White woman in no acute distress  Vitals:   11/29/19 0908  BP: (!) 152/60  Pulse: 70  Resp: 18  Temp: 98.9 F (37.2 C)  SpO2: 98%     Body mass  index is 30.57 kg/m.   Wt Readings from Last 3 Encounters:  11/29/19 161 lb 12.8 oz (73.4 kg)  11/15/19 166 lb (75.3 kg)  08/09/19 163 lb 9.6 oz (74.2 kg)      ECOG FS:1 - Symptomatic but completely ambulatory  Ocular: Sclerae unicteric, pupils round and equal Ear-nose-throat: Wearing a mask Lymphatic: No cervical or supraclavicular adenopathy Lungs no rales or rhonchi Heart regular rate and rhythm Abd soft, nontender, positive bowel sounds MSK no focal spinal tenderness, no joint edema Neuro: non-focal, well-oriented, appropriate affect Breasts: The right breast is unremarkable.  In the left breast upper outer quadrant there is a movable mass which measures perhaps 2 cm by palpation, with no skin or nipple changes of concern.  I do not palpate any axillary adenopathy.   LAB RESULTS:  CMP     Component Value Date/Time   NA 139 11/29/2019 0845   K 3.9 11/29/2019 0845   CL 105 11/29/2019 0845   CO2 25 11/29/2019 0845   GLUCOSE 105 (H) 11/29/2019 0845   BUN 34 (H) 11/29/2019 0845   CREATININE 1.06 (H) 11/29/2019 0845   CALCIUM  10.0 11/29/2019 0845   PROT 8.2 (H) 11/29/2019 0845   ALBUMIN 4.4 11/29/2019 0845   AST 23 11/29/2019 0845   ALT 17 11/29/2019 0845   ALKPHOS 48 11/29/2019 0845   BILITOT 0.6 11/29/2019 0845   GFRNONAA 52 (L) 11/29/2019 0845   GFRAA >60 11/29/2019 0845    No results found for: TOTALPROTELP, ALBUMINELP, A1GS, A2GS, BETS, BETA2SER, GAMS, MSPIKE, SPEI  Lab Results  Component Value Date   WBC 6.8 11/29/2019   NEUTROABS 3.9 11/29/2019   HGB 12.9 11/29/2019   HCT 38.4 11/29/2019   MCV 88.5 11/29/2019   PLT 357 11/29/2019    No results found for: LABCA2  No components found for: HYWVPX106  No results for input(s): INR in the last 168 hours.  No results found for: LABCA2  No results found for: YIR485  No results found for: IOE703  No results found for: JKK938  No results found for: CA2729  No components found for: HGQUANT  No  results found for: CEA1 / No results found for: CEA1   No results found for: AFPTUMOR  No results found for: CHROMOGRNA  No results found for: KPAFRELGTCHN, LAMBDASER, KAPLAMBRATIO (kappa/lambda light chains)  No results found for: HGBA, HGBA2QUANT, HGBFQUANT, HGBSQUAN (Hemoglobinopathy evaluation)   Lab Results  Component Value Date   LDH 230 (H) 06/28/2019    No results found for: IRON, TIBC, IRONPCTSAT (Iron and TIBC)  Lab Results  Component Value Date   FERRITIN 626 (H) 07/03/2019    Urinalysis    Component Value Date/Time   COLORURINE YELLOW 06/11/2013 2007   APPEARANCEUR CLEAR 06/11/2013 2007   LABSPEC 1.017 06/11/2013 2007   PHURINE 6.5 06/11/2013 2007   GLUCOSEU NEGATIVE 06/11/2013 2007   HGBUR NEGATIVE 06/11/2013 2007   [place] NEGATIVE 06/11/2013 2007   [place] NEGATIVE 06/11/2013 2007   PROTEINUR NEGATIVE 06/11/2013 2007   UROBILINOGEN 0.2 06/11/2013 2007   NITRITE NEGATIVE 06/11/2013 2007   LEUKOCYTESUR NEGATIVE 06/11/2013 2007     STUDIES: DG Chest 2 View  Result Date: 11/15/2019 CLINICAL DATA:  Cough.  Post COVID-19. EXAM: CHEST - 2 VIEW COMPARISON:  08/09/2019 FINDINGS: The heart size and mediastinal contours are within normal limits. Both lungs are clear. The visualized skeletal structures are unremarkable. IMPRESSION: No active cardiopulmonary disease. Electronically Signed   By: Franchot Gallo M.D.   On: 11/15/2019 16:23   US BREAST LTD UNI LEFT INC AXILLA  Result Date: 11/09/2019 CLINICAL DATA:  Palpable mass associated dimpling in the LEFT breast noted 2 weeks ago. Patient reports associated tenderness and pinching pain. Patient is scheduled for her first Covid-19 vaccine next week. EXAM: DIGITAL DIAGNOSTIC BILATERAL MAMMOGRAM WITH CAD AND TOMO ULTRASOUND LEFT BREAST COMPARISON:  12/23/2016 ACR Breast Density Category c: The breast tissue is heterogeneously dense, which may obscure small masses. FINDINGS: RIGHT breast is negative. There is an  irregular mass with spiculated margins the UPPER-OUTER QUADRANT of the LEFT breast and marked as palpable with a BB. Mammographic images were processed with CAD. On physical exam, I palpate a firm mass in the UPPER-OUTER QUADRANT of the LEFT breast. I palpate a firm mass in the LOWER LEFT axilla. Targeted ultrasound is performed, showing irregular mass in the 2:30 o'clock location of the LEFT breast 8 centimeters from the nipple corresponding to the palpable abnormality. Mass measures 1.9 x 1.3 x 2.9 centimeters. An adjacent hypoechoic oval mass is 0.3 x 0.4 x 1.0 centimeters. Measured together, masses are 1.9 x 1.8 x 2.9 centimeters. On Doppler evaluation  there is associated internal blood flow. Evaluation of the LEFT axilla shows 3 enlarged lymph nodes which lack fatty hila. The largest is the LOWER most lymph node and measures 2.1 x 1.5 x 2.1 centimeters. A second is 1.3 x 1.1 x 1.8 centimeters. A third node is 1.9 x 1.2 x 1.3 centimeters. Additional lymph nodes with normal morphology are also imaged. IMPRESSION: 1. Palpable mass in the 2:30 o'clock location of the LEFT breast is suspicious for malignancy and biopsy is recommended. 2. Three enlarged LEFT axillary lymph nodes. RECOMMENDATION: 1. Ultrasound-guided core biopsy mass in the 2:30 o'clock location of the LEFT breast. Recommend biopsy of adjacent masses as a single lesion. 2. Ultrasound-guided core biopsy of 1 of the enlarged LEFT axillary lymph nodes. 3. Biopsies will be scheduled for the patient. I have discussed the findings and recommendations with the patient. If applicable, a reminder letter will be sent to the patient regarding the next appointment. BI-RADS CATEGORY  5: Highly suggestive of malignancy. Electronically Signed   By: Nolon Nations M.D.   On: 11/09/2019 14:22   MM DIAG BREAST TOMO BILATERAL  Result Date: 11/09/2019 CLINICAL DATA:  Palpable mass associated dimpling in the LEFT breast noted 2 weeks ago. Patient reports associated  tenderness and pinching pain. Patient is scheduled for her first Covid-19 vaccine next week. EXAM: DIGITAL DIAGNOSTIC BILATERAL MAMMOGRAM WITH CAD AND TOMO ULTRASOUND LEFT BREAST COMPARISON:  12/23/2016 ACR Breast Density Category c: The breast tissue is heterogeneously dense, which may obscure small masses. FINDINGS: RIGHT breast is negative. There is an irregular mass with spiculated margins the UPPER-OUTER QUADRANT of the LEFT breast and marked as palpable with a BB. Mammographic images were processed with CAD. On physical exam, I palpate a firm mass in the UPPER-OUTER QUADRANT of the LEFT breast. I palpate a firm mass in the LOWER LEFT axilla. Targeted ultrasound is performed, showing irregular mass in the 2:30 o'clock location of the LEFT breast 8 centimeters from the nipple corresponding to the palpable abnormality. Mass measures 1.9 x 1.3 x 2.9 centimeters. An adjacent hypoechoic oval mass is 0.3 x 0.4 x 1.0 centimeters. Measured together, masses are 1.9 x 1.8 x 2.9 centimeters. On Doppler evaluation there is associated internal blood flow. Evaluation of the LEFT axilla shows 3 enlarged lymph nodes which lack fatty hila. The largest is the LOWER most lymph node and measures 2.1 x 1.5 x 2.1 centimeters. A second is 1.3 x 1.1 x 1.8 centimeters. A third node is 1.9 x 1.2 x 1.3 centimeters. Additional lymph nodes with normal morphology are also imaged. IMPRESSION: 1. Palpable mass in the 2:30 o'clock location of the LEFT breast is suspicious for malignancy and biopsy is recommended. 2. Three enlarged LEFT axillary lymph nodes. RECOMMENDATION: 1. Ultrasound-guided core biopsy mass in the 2:30 o'clock location of the LEFT breast. Recommend biopsy of adjacent masses as a single lesion. 2. Ultrasound-guided core biopsy of 1 of the enlarged LEFT axillary lymph nodes. 3. Biopsies will be scheduled for the patient. I have discussed the findings and recommendations with the patient. If applicable, a reminder letter will  be sent to the patient regarding the next appointment. BI-RADS CATEGORY  5: Highly suggestive of malignancy. Electronically Signed   By: Nolon Nations M.D.   On: 11/09/2019 14:22   [place] AXILLARY NODE CORE BIOPSY LEFT  Addendum Date: 11/21/2019   ADDENDUM REPORT: 11/20/2019 11:49 ADDENDUM: Pathology revealed GRADE II INVASIVE MAMMARY CARCINOMA of the Left breast. 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  This was found to be concordant by Dr. Curlene Dolphin. Pathology results were discussed with the patient by telephone. The patient reported doing well after the biopsies with tenderness at the sites. Post biopsy instructions and care were reviewed and questions were answered. The patient was encouraged to call [facility] for any additional concerns. The patient was referred to [facility] on November 29, 2019. Pathology results reported by Terie Purser, RN on 11/20/2019. Electronically Signed   By: Curlene Dolphin M.D.   On: 11/20/2019 11:49   Result Date: 11/21/2019 CLINICAL DATA:  Ultrasound-guided core needle biopsy was recommended of 1 of several suspicious left axillary lymph nodes. EXAM: [place] AXILLARY NODE CORE BIOPSY LEFT COMPARISON:  Previous exam(s). PROCEDURE: I met with the patient and we discussed the procedure of ultrasound-guided biopsy, including benefits and alternatives. We discussed the high likelihood of a successful procedure. We discussed the risks of the procedure, including infection, bleeding, tissue injury, clip migration, and inadequate sampling. Informed written consent was given. The usual time-out protocol was performed immediately prior to the procedure. Using sterile technique and 1% Lidocaine as local anesthetic, under direct ultrasound visualization, a 14 gauge spring-loaded device was used to perform biopsy of an enlarged lymph node with loss of the fatty hilum in the  inferior left axilla using a lateral approach. At the conclusion of the procedure Q shaped tissue marker clip was deployed into the biopsy cavity. Follow up 2 view mammogram was performed and dictated separately. IMPRESSION: Ultrasound guided biopsy of the left axilla. No apparent complications. Electronically Signed: By: Curlene Dolphin M.D. On: 11/17/2019 08:45   MM CLIP PLACEMENT LEFT  Result Date: 11/17/2019 CLINICAL DATA:  Ultrasound-guided biopsies were performed of a suspicious left breast mass and an abnormal left axillary lymph node. EXAM: DIAGNOSTIC LEFT MAMMOGRAM POST ULTRASOUND BIOPSY X 2. COMPARISON:  Previous exams. FINDINGS: Mammographic images were obtained following ultrasound guided biopsy of a left breast mass at 2:30 position 8 cm from the nipple and an ultrasound-guided biopsy of an inferior left axillary lymph node. The ribbon shaped biopsy marking clip is satisfactorily positioned within the biopsied breast mass in the upper outer quadrant. A Q shaped biopsy clip marker is satisfactorily positioned within an inferior left axillary lymph node. IMPRESSION: Appropriate positioning of the ribbon and Q shaped shaped biopsy marking clips at the site of the 2 biopsies performed today. Final Assessment: Post Procedure Mammograms for Marker Placement Electronically Signed   By: Curlene Dolphin M.D.   On: 11/17/2019 09:07   [place] LT BREAST BX W LOC DEV 1ST LESION IMG BX SPEC US GUIDE  Addendum Date: 11/21/2019   ADDENDUM REPORT: 11/20/2019 11:48 ADDENDUM: Pathology revealed GRADE II INVASIVE MAMMARY CARCINOMA of the Left breast. 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 This was found to be concordant by Dr. Curlene Dolphin. Pathology results were discussed with the patient by telephone. The patient reported doing well after the biopsies with tenderness at the sites. Post biopsy instructions and care were reviewed and questions were answered. The patient was encouraged to call [facility] for any additional concerns. The patient was referred to [facility] on November 29, 2019. Pathology results reported by Terie Purser, RN on 11/20/2019. Electronically Signed   By: Curlene Dolphin M.D.   On: 11/20/2019 11:48   Result Date: 11/21/2019 CLINICAL  DATA:  Ultrasound-guided core needle biopsy was recommended of a suspicious palpable mass 2:30 position left breast, with the dominant mass measuring 2.9 cm, and immediately adjacent 1 cm mass. This was biopsied as one lesion. EXAM: ULTRASOUND GUIDED LEFT BREAST CORE NEEDLE BIOPSY COMPARISON:  Previous exam(s). PROCEDURE: I met with the patient and we discussed the procedure of ultrasound-guided biopsy, including benefits and alternatives. We discussed the high likelihood of a successful procedure. We discussed the risks of the procedure, including infection, bleeding, tissue injury, clip migration, and inadequate sampling. Informed written consent was given. The usual time-out protocol was performed immediately prior to the procedure. Lesion quadrant: Upper outer quadrant Using sterile technique and 1% Lidocaine with and without epinephrine as local anesthetic, under direct ultrasound visualization, a 12 gauge spring-loaded device was used to perform biopsy of palpable mass at 2:30 position left breast 8 cm from the nipple using a lateral approach. At the conclusion of the procedure ribbon shaped tissue marker clip was deployed into the biopsy cavity. Follow up 2 view mammogram was performed and dictated separately. IMPRESSION: Ultrasound guided biopsy of left breast.  No apparent complications. Electronically Signed: By: Curlene Dolphin M.D. On: 11/17/2019 08:44     ELIGIBLE FOR AVAILABLE RESEARCH PROTOCOL: AET  ASSESSMENT: 74 y.o. [place] woman status post left breast upper outer quadrant biopsy 11/17/2019 for a clinical T2 N1, stage IIA invasive  ductal carcinoma, grade 2, estrogen and progesterone receptor positive, HER-2 not amplified, with an MIB-1 of 30%.  (1) definitive surgery pending  (2) MammaPrint to be obtained from the definitive surgical sample  (3) adjuvant radiation  (4) antiestrogens   PLAN: I met today with Egan to review her new diagnosis. Specifically we discussed the biology of her breast cancer, its diagnosis, staging, treatment  options and prognosis. We first reviewed the fact that cancer is not one disease but more than 100 different diseases and that it is important to keep them separate-- otherwise when friends and relatives discuss their own cancer experiences with Jazelle confusion can result. Similarly we explained that if breast cancer spreads to the bone or liver, the patient would not have bone cancer or liver cancer, but breast cancer in the bone and breast cancer in the liver: one cancer in three places-- not 3 different cancers which otherwise would have to be treated in 3 different ways.  We discussed the difference between local and systemic therapy. In terms of loco-regional treatment, lumpectomy plus radiation is equivalent to mastectomy as far as survival is concerned. For this reason, and because the cosmetic results are generally superior, we recommend breast conserving surgery.   We then discussed the rationale for systemic therapy. There is some risk that this cancer may have already spread to other parts of her body. Patients frequently ask at this point about bone scans, CAT scans and PET scans to find out if they have occult breast cancer somewhere else. The problem is that in early stage disease we are much more likely to find false positives then true cancers and this would expose the patient to unnecessary procedures as well as unnecessary radiation. Scans cannot answer the question the patient really would like to know, which is whether she has microscopic disease elsewhere in her body. For  those reasons we do not recommend them.  Of course we would proceed to aggressive evaluation of any symptoms that might suggest metastatic disease, but that is not the case here.  Next we went over the options for systemic  therapy which are anti-estrogens, anti-HER-2 immunotherapy, and chemotherapy. Arieanna does not meet criteria for anti-HER-2 immunotherapy. She is a good candidate for anti-estrogens.  The question of chemotherapy is more complicated. Chemotherapy is most effective in rapidly growing, aggressive tumors. It may be less effective in intermediate-grade cancers, like Talulah 's. For that reason we are going to request a MammaPrint study from the definitive surgical sample, as suggested by NCCN guidelines. That will help [place] make a definitive decision regarding chemotherapy in this case.  The plan then is to start with surgery, consider chemotherapy, then adjuvant radiation and antiestrogens.  Anwar has a good understanding of the overall plan. She agrees with it. She knows the goal of treatment in her case is cure. She will call with any problems that may develop before her next visit here.  Total encounter time 65 minutes.Sarajane Jews C. Syeda Prickett, MD 11/29/2019 11:16 AM Medical Oncology and Hematology [facility] [place], [place] [address] Tel. [phone]    Fax. [phone]   This document serves as a record of services personally performed by Lurline Del, MD. It was created on his behalf by Wilburn Mylar, a trained medical scribe. The creation of this record is based on the scribe's personal observations and the provider's statements to them.   I, Lurline Del MD, have reviewed the above documentation for accuracy and completeness, and I agree with the above.    *Total Encounter Time as defined by the Centers for Medicare and Medicaid Services includes, in addition to the face-to-face time of a patient visit (documented in the note  above) non-face-to-face time: obtaining and reviewing outside history, ordering and reviewing medications, tests or procedures, care coordination (communications with other health care professionals or caregivers) and documentation in the medical record.

## 2019-11-29 ENCOUNTER — Telehealth: Payer: Self-pay | Admitting: Oncology

## 2019-11-29 ENCOUNTER — Other Ambulatory Visit: Payer: Self-pay

## 2019-11-29 ENCOUNTER — Inpatient Hospital Stay: Payer: Medicare HMO

## 2019-11-29 ENCOUNTER — Ambulatory Visit
Admission: RE | Admit: 2019-11-29 | Discharge: 2019-11-29 | Disposition: A | Discharge status: Home / Self Care | Payer: Medicare HMO | Source: Ambulatory Visit | Attending: Radiation Oncology | Admitting: Radiation Oncology

## 2019-11-29 ENCOUNTER — Inpatient Hospital Stay: Payer: Medicare HMO | Attending: Oncology | Admitting: Oncology

## 2019-11-29 ENCOUNTER — Encounter: Payer: Self-pay | Admitting: *Deleted

## 2019-11-29 ENCOUNTER — Other Ambulatory Visit: Payer: Self-pay | Admitting: *Deleted

## 2019-11-29 ENCOUNTER — Other Ambulatory Visit (HOSPITAL_COMMUNITY): Payer: Self-pay | Admitting: Surgery

## 2019-11-29 ENCOUNTER — Ambulatory Visit: Payer: Medicare HMO | Attending: Surgery | Admitting: Physical Therapy

## 2019-11-29 VITALS — BP 152/60 | HR 70 | Temp 98.9°F | Resp 18 | Ht 61.0 in | Wt 161.8 lb

## 2019-11-29 DIAGNOSIS — Z170 Estrogen receptor positive status [ER+]: Secondary | ICD-10-CM | POA: Insufficient documentation

## 2019-11-29 DIAGNOSIS — R293 Abnormal posture: Secondary | ICD-10-CM | POA: Insufficient documentation

## 2019-11-29 DIAGNOSIS — R918 Other nonspecific abnormal finding of lung field: Secondary | ICD-10-CM | POA: Insufficient documentation

## 2019-11-29 DIAGNOSIS — Z818 Family history of other mental and behavioral disorders: Secondary | ICD-10-CM | POA: Insufficient documentation

## 2019-11-29 DIAGNOSIS — C50912 Malignant neoplasm of unspecified site of left female breast: Secondary | ICD-10-CM

## 2019-11-29 DIAGNOSIS — C50412 Malignant neoplasm of upper-outer quadrant of left female breast: Secondary | ICD-10-CM | POA: Diagnosis not present

## 2019-11-29 DIAGNOSIS — I10 Essential (primary) hypertension: Secondary | ICD-10-CM | POA: Diagnosis not present

## 2019-11-29 DIAGNOSIS — Z79899 Other long term (current) drug therapy: Secondary | ICD-10-CM | POA: Insufficient documentation

## 2019-11-29 DIAGNOSIS — R05 Cough: Secondary | ICD-10-CM | POA: Diagnosis not present

## 2019-11-29 DIAGNOSIS — Z803 Family history of malignant neoplasm of breast: Secondary | ICD-10-CM | POA: Diagnosis not present

## 2019-11-29 DIAGNOSIS — C778 Secondary and unspecified malignant neoplasm of lymph nodes of multiple regions: Secondary | ICD-10-CM

## 2019-11-29 DIAGNOSIS — K219 Gastro-esophageal reflux disease without esophagitis: Secondary | ICD-10-CM | POA: Insufficient documentation

## 2019-11-29 DIAGNOSIS — Z8249 Family history of ischemic heart disease and other diseases of the circulatory system: Secondary | ICD-10-CM | POA: Insufficient documentation

## 2019-11-29 LAB — CBC WITH DIFFERENTIAL (CANCER CENTER ONLY)
Abs Immature Granulocytes: 0.01 10*3/uL (ref 0.00–0.07)
Basophils Absolute: 0.1 10*3/uL (ref 0.0–0.1)
Basophils Relative: 1 %
Eosinophils Absolute: 0.3 10*3/uL (ref 0.0–0.5)
Eosinophils Relative: 4 %
HCT: 38.4 % (ref 36.0–46.0)
Hemoglobin: 12.9 g/dL (ref 12.0–15.0)
Immature Granulocytes: 0 %
Lymphocytes Relative: 30 %
Lymphs Abs: 2 10*3/uL (ref 0.7–4.0)
MCH: 29.7 pg (ref 26.0–34.0)
MCHC: 33.6 g/dL (ref 30.0–36.0)
MCV: 88.5 fL (ref 80.0–100.0)
Monocytes Absolute: 0.5 10*3/uL (ref 0.1–1.0)
Monocytes Relative: 8 %
Neutro Abs: 3.9 10*3/uL (ref 1.7–7.7)
Neutrophils Relative %: 57 %
Platelet Count: 357 10*3/uL (ref 150–400)
RBC: 4.34 MIL/uL (ref 3.87–5.11)
RDW: 13.2 % (ref 11.5–15.5)
WBC Count: 6.8 10*3/uL (ref 4.0–10.5)
nRBC: 0 % (ref 0.0–0.2)

## 2019-11-29 LAB — CMP (CANCER CENTER ONLY)
ALT: 17 U/L (ref 0–44)
AST: 23 U/L (ref 15–41)
Albumin: 4.4 g/dL (ref 3.5–5.0)
Alkaline Phosphatase: 48 U/L (ref 38–126)
Anion gap: 9 (ref 5–15)
BUN: 34 mg/dL — ABNORMAL HIGH (ref 8–23)
CO2: 25 mmol/L (ref 22–32)
Calcium: 10 mg/dL (ref 8.9–10.3)
Chloride: 105 mmol/L (ref 98–111)
Creatinine: 1.06 mg/dL — ABNORMAL HIGH (ref 0.44–1.00)
GFR, Est AFR Am: >60 mL/min (ref >60)
GFR, Estimated: 52 mL/min — ABNORMAL LOW (ref >60)
Glucose, Bld: 105 mg/dL — ABNORMAL HIGH (ref 70–99)
Potassium: 3.9 mmol/L (ref 3.5–5.1)
Sodium: 139 mmol/L (ref 135–145)
Total Bilirubin: 0.6 mg/dL (ref 0.3–1.2)
Total Protein: 8.2 g/dL — ABNORMAL HIGH (ref 6.5–8.1)

## 2019-11-29 NOTE — Progress Notes (Signed)
Radiation Oncology         [phone] ________________________________  Name: Cynthia Vasquez        MRN: [id number]  Date of Service: 11/29/2019 DOB: [birth year]  MG:NOIBBC, [name] DO  Alphonsa Overall, MD     REFERRING PHYSICIAN: Alphonsa Overall, MD   DIAGNOSIS: The encounter diagnosis was Malignant neoplasm of upper-outer quadrant of left breast in female, estrogen receptor positive ([place]).   HISTORY OF PRESENT ILLNESS: Cynthia Vasquez is a 74 y.o. female seen in the multidisciplinary breast clinic for a new diagnosis of left breast cancer. The patient was noted to have a palpable mass in the left breast and she proceeded with diagnostic work up. Diagnostic imaging revealed 2 masses at 2:30. They measured 2.9 x 1.9 x 1.3 cm, and 1 x .4 x.3 cm and three enlarged axillary nodes. 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 Her tumor was ER/PR positive, HER2 negative with a Ki 67 of 30%. She is seen today to discuss treatment recommendations for her cancer.   PREVIOUS RADIATION THERAPY: No   PAST MEDICAL HISTORY:  Past Medical History:  Diagnosis Date  . Atrial septal defect    repaired age 20  . GERD (gastroesophageal reflux disease)   . Headache    migraines until age 28- 23, have them 3-4 x year  . Hypertension   . Vitamin D deficiency        PAST SURGICAL HISTORY: Past Surgical History:  Procedure Laterality Date  . ASD REPAIR     age 52     FAMILY HISTORY:  Family History  Problem Relation Age of Onset  . Dementia Mother   . Heart attack Father   . Hypertension Father   . Breast cancer Sister 15     SOCIAL HISTORY:  reports that she has never smoked. She has never used smokeless tobacco. She reports that she does not drink alcohol or use drugs. The patient is married and lives in [place]. She's a retired Optometrist.   ALLERGIES: Patient has no known  allergies.   MEDICATIONS:  Current Outpatient Medications  Medication Sig Dispense Refill  . acetaminophen (TYLENOL) 500 MG tablet Take 500 mg by mouth every 6 (six) hours as needed for mild pain.     . benzonatate (TESSALON) 100 MG capsule Take 1 capsule (100 mg total) by mouth 2 (two) times daily as needed for cough. 30 capsule 1  . cholecalciferol (VITAMIN D) 1000 UNITS tablet Take 1,000 Units by mouth daily.    . hydrochlorothiazide (HYDRODIURIL) 25 MG tablet Take 1 tablet by mouth daily.    Marland Kitchen latanoprost (XALATAN) 0.005 % ophthalmic solution Place 1 drop into both eyes at bedtime.    Marland Kitchen losartan (COZAAR) 50 MG tablet Take 1 tablet by mouth daily.    . Multiple Vitamins-Minerals (MULTIVITAMIN WITH MINERALS) tablet Take 1 tablet by mouth daily.    . pantoprazole (PROTONIX) 40 MG tablet Take 40 mg by mouth daily as needed (heart burn).      No current facility-administered medications for this visit.     REVIEW OF SYSTEMS: On review of systems, the patient reports that she is doing well overall. She did have covid in the fall of 2020 but states after remdesivir she is back to her baseline. She denies any chest pain, shortness of breath, cough, fevers, chills, night sweats, unintended weight changes. She denies any bowel or  bladder disturbances, and denies abdominal pain, nausea or vomiting. She denies any new musculoskeletal or joint aches or pains. A complete review of systems is obtained and is otherwise negative.     PHYSICAL EXAM:  Wt Readings from Last 3 Encounters:  11/15/19 166 lb (75.3 kg)  08/09/19 163 lb 9.6 oz (74.2 kg)  07/04/19 153 lb 6.4 oz (69.6 kg)   Temp Readings from Last 3 Encounters:  11/15/19 (!) 97.1 F (36.2 C) (Temporal)  08/09/19 97.9 F (36.6 C) (Temporal)  07/04/19 99.3 F (37.4 C) (Oral)   BP Readings from Last 3 Encounters:  11/15/19 124/62  08/09/19 120/80  07/04/19 115/68   Pulse Readings from Last 3 Encounters:  11/15/19 78  08/09/19 76   07/04/19 81    In general this is a well appearing caucasian female in no acute distress. She's alert and oriented x4 and appropriate throughout the examination. Cardiopulmonary assessment is negative for acute distress and she exhibits normal effort. Bilateral breast exam is deferred.    ECOG = 0  0 - Asymptomatic (Fully active, able to carry on all predisease activities without restriction)  1 - Symptomatic but completely ambulatory (Restricted in physically strenuous activity but ambulatory and able to carry out work of a light or sedentary nature. For example, light housework, office work)  2 - Symptomatic, <50% in bed during the day (Ambulatory and capable of all self care but unable to carry out any work activities. Up and about more than 50% of waking hours)  3 - Symptomatic, >50% in bed, but not bedbound (Capable of only limited self-care, confined to bed or chair 50% or more of waking hours)  4 - Bedbound (Completely disabled. Cannot carry on any self-care. Totally confined to bed or chair)  5 - Death   Eustace Pen MM, Creech RH, Tormey DC, et al. [phone]). "Toxicity and response criteria of the [facility]". [place] Oncol. 5 (6): 649-55    LABORATORY DATA:  Lab Results  Component Value Date   WBC 9.5 07/04/2019   HGB 12.8 07/04/2019   HCT 38.9 07/04/2019   MCV 89.0 07/04/2019   PLT 346 07/04/2019   Lab Results  Component Value Date   NA 139 07/04/2019   K 4.6 07/04/2019   CL 101 07/04/2019   CO2 27 07/04/2019   Lab Results  Component Value Date   ALT 27 07/04/2019   AST 27 07/04/2019   ALKPHOS 40 07/04/2019   BILITOT 0.5 07/04/2019      RADIOGRAPHY: DG Chest 2 View  Result Date: 11/15/2019 CLINICAL DATA:  Cough.  Post COVID-19. EXAM: CHEST - 2 VIEW COMPARISON:  08/09/2019 FINDINGS: The heart size and mediastinal contours are within normal limits. Both lungs are clear. The visualized skeletal structures are unremarkable. IMPRESSION: No  active cardiopulmonary disease. Electronically Signed   By: Franchot Gallo M.D.   On: 11/15/2019 16:23   US BREAST LTD UNI LEFT INC AXILLA  Result Date: 11/09/2019 CLINICAL DATA:  Palpable mass associated dimpling in the LEFT breast noted 2 weeks ago. Patient reports associated tenderness and pinching pain. Patient is scheduled for her first Covid-19 vaccine next week. EXAM: DIGITAL DIAGNOSTIC BILATERAL MAMMOGRAM WITH CAD AND TOMO ULTRASOUND LEFT BREAST COMPARISON:  12/23/2016 ACR Breast Density Category c: The breast tissue is heterogeneously dense, which may obscure small masses. FINDINGS: RIGHT breast is negative. There is an irregular mass with spiculated margins the UPPER-OUTER QUADRANT of the LEFT breast and marked as palpable with a  BB. Mammographic images were processed with CAD. On physical exam, I palpate a firm mass in the UPPER-OUTER QUADRANT of the LEFT breast. I palpate a firm mass in the LOWER LEFT axilla. Targeted ultrasound is performed, showing irregular mass in the 2:30 o'clock location of the LEFT breast 8 centimeters from the nipple corresponding to the palpable abnormality. Mass measures 1.9 x 1.3 x 2.9 centimeters. An adjacent hypoechoic oval mass is 0.3 x 0.4 x 1.0 centimeters. Measured together, masses are 1.9 x 1.8 x 2.9 centimeters. On Doppler evaluation there is associated internal blood flow. Evaluation of the LEFT axilla shows 3 enlarged lymph nodes which lack fatty hila. The largest is the LOWER most lymph node and measures 2.1 x 1.5 x 2.1 centimeters. A second is 1.3 x 1.1 x 1.8 centimeters. A third node is 1.9 x 1.2 x 1.3 centimeters. Additional lymph nodes with normal morphology are also imaged. IMPRESSION: 1. Palpable mass in the 2:30 o'clock location of the LEFT breast is suspicious for malignancy and biopsy is recommended. 2. Three enlarged LEFT axillary lymph nodes. RECOMMENDATION: 1. Ultrasound-guided core biopsy mass in the 2:30 o'clock location of the LEFT breast.  Recommend biopsy of adjacent masses as a single lesion. 2. Ultrasound-guided core biopsy of 1 of the enlarged LEFT axillary lymph nodes. 3. Biopsies will be scheduled for the patient. I have discussed the findings and recommendations with the patient. If applicable, a reminder letter will be sent to the patient regarding the next appointment. BI-RADS CATEGORY  5: Highly suggestive of malignancy. Electronically Signed   By: Nolon Nations M.D.   On: 11/09/2019 14:22   MM DIAG BREAST TOMO BILATERAL  Result Date: 11/09/2019 CLINICAL DATA:  Palpable mass associated dimpling in the LEFT breast noted 2 weeks ago. Patient reports associated tenderness and pinching pain. Patient is scheduled for her first Covid-19 vaccine next week. EXAM: DIGITAL DIAGNOSTIC BILATERAL MAMMOGRAM WITH CAD AND TOMO ULTRASOUND LEFT BREAST COMPARISON:  12/23/2016 ACR Breast Density Category c: The breast tissue is heterogeneously dense, which may obscure small masses. FINDINGS: RIGHT breast is negative. There is an irregular mass with spiculated margins the UPPER-OUTER QUADRANT of the LEFT breast and marked as palpable with a BB. Mammographic images were processed with CAD. On physical exam, I palpate a firm mass in the UPPER-OUTER QUADRANT of the LEFT breast. I palpate a firm mass in the LOWER LEFT axilla. Targeted ultrasound is performed, showing irregular mass in the 2:30 o'clock location of the LEFT breast 8 centimeters from the nipple corresponding to the palpable abnormality. Mass measures 1.9 x 1.3 x 2.9 centimeters. An adjacent hypoechoic oval mass is 0.3 x 0.4 x 1.0 centimeters. Measured together, masses are 1.9 x 1.8 x 2.9 centimeters. On Doppler evaluation there is associated internal blood flow. Evaluation of the LEFT axilla shows 3 enlarged lymph nodes which lack fatty hila. The largest is the LOWER most lymph node and measures 2.1 x 1.5 x 2.1 centimeters. A second is 1.3 x 1.1 x 1.8 centimeters. A third node is 1.9 x 1.2 x 1.3  centimeters. Additional lymph nodes with normal morphology are also imaged. IMPRESSION: 1. Palpable mass in the 2:30 o'clock location of the LEFT breast is suspicious for malignancy and biopsy is recommended. 2. Three enlarged LEFT axillary lymph nodes. RECOMMENDATION: 1. Ultrasound-guided core biopsy mass in the 2:30 o'clock location of the LEFT breast. Recommend biopsy of adjacent masses as a single lesion. 2. Ultrasound-guided core biopsy of 1 of the enlarged LEFT axillary lymph nodes. 3.  Biopsies will be scheduled for the patient. I have discussed the findings and recommendations with the patient. If applicable, a reminder letter will be sent to the patient regarding the next appointment. BI-RADS CATEGORY  5: Highly suggestive of malignancy. Electronically Signed   By: Nolon Nations M.D.   On: 11/09/2019 14:22   [place] AXILLARY NODE CORE BIOPSY LEFT  Addendum Date: 11/21/2019   ADDENDUM REPORT: 11/20/2019 11:49 ADDENDUM: Pathology revealed GRADE II INVASIVE MAMMARY CARCINOMA of the Left breast. 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 This was found to be concordant by Dr. Curlene Dolphin. Pathology results were discussed with the patient by telephone. The patient reported doing well after the biopsies with tenderness at the sites. Post biopsy instructions and care were reviewed and questions were answered. The patient was encouraged to call [facility] for any additional concerns. The patient was referred to [facility] on November 29, 2019. Pathology results reported by Terie Purser, RN on 11/20/2019. Electronically Signed   By: Curlene Dolphin M.D.   On: 11/20/2019 11:49   Result Date: 11/21/2019 CLINICAL DATA:  Ultrasound-guided core needle biopsy was recommended of 1 of several suspicious left axillary lymph nodes. EXAM: [place] AXILLARY NODE CORE BIOPSY LEFT COMPARISON:  Previous exam(s). PROCEDURE:  I met with the patient and we discussed the procedure of ultrasound-guided biopsy, including benefits and alternatives. We discussed the high likelihood of a successful procedure. We discussed the risks of the procedure, including infection, bleeding, tissue injury, clip migration, and inadequate sampling. Informed written consent was given. The usual time-out protocol was performed immediately prior to the procedure. Using sterile technique and 1% Lidocaine as local anesthetic, under direct ultrasound visualization, a 14 gauge spring-loaded device was used to perform biopsy of an enlarged lymph node with loss of the fatty hilum in the inferior left axilla using a lateral approach. At the conclusion of the procedure Q shaped tissue marker clip was deployed into the biopsy cavity. Follow up 2 view mammogram was performed and dictated separately. IMPRESSION: Ultrasound guided biopsy of the left axilla. No apparent complications. Electronically Signed: By: Curlene Dolphin M.D. On: 11/17/2019 08:45   MM CLIP PLACEMENT LEFT  Result Date: 11/17/2019 CLINICAL DATA:  Ultrasound-guided biopsies were performed of a suspicious left breast mass and an abnormal left axillary lymph node. EXAM: DIAGNOSTIC LEFT MAMMOGRAM POST ULTRASOUND BIOPSY X 2. COMPARISON:  Previous exams. FINDINGS: Mammographic images were obtained following ultrasound guided biopsy of a left breast mass at 2:30 position 8 cm from the nipple and an ultrasound-guided biopsy of an inferior left axillary lymph node. The ribbon shaped biopsy marking clip is satisfactorily positioned within the biopsied breast mass in the upper outer quadrant. A Q shaped biopsy clip marker is satisfactorily positioned within an inferior left axillary lymph node. IMPRESSION: Appropriate positioning of the ribbon and Q shaped shaped biopsy marking clips at the site of the 2 biopsies performed today. Final Assessment: Post Procedure Mammograms for Marker Placement Electronically  Signed   By: Curlene Dolphin M.D.   On: 11/17/2019 09:07   [place] LT BREAST BX W LOC DEV 1ST LESION IMG BX SPEC US GUIDE  Addendum Date: 11/21/2019   ADDENDUM REPORT: 11/20/2019 11:48 ADDENDUM: Pathology revealed GRADE II INVASIVE MAMMARY CARCINOMA of the Left breast. 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 This was found to be concordant by Dr. Curlene Dolphin. Pathology results were discussed with the patient  by telephone. The patient reported doing well after the biopsies with tenderness at the sites. Post biopsy instructions and care were reviewed and questions were answered. The patient was encouraged to call [facility] for any additional concerns. The patient was referred to [facility] on November 29, 2019. Pathology results reported by Terie Purser, RN on 11/20/2019. Electronically Signed   By: Curlene Dolphin M.D.   On: 11/20/2019 11:48   Result Date: 11/21/2019 CLINICAL DATA:  Ultrasound-guided core needle biopsy was recommended of a suspicious palpable mass 2:30 position left breast, with the dominant mass measuring 2.9 cm, and immediately adjacent 1 cm mass. This was biopsied as one lesion. EXAM: ULTRASOUND GUIDED LEFT BREAST CORE NEEDLE BIOPSY COMPARISON:  Previous exam(s). PROCEDURE: I met with the patient and we discussed the procedure of ultrasound-guided biopsy, including benefits and alternatives. We discussed the high likelihood of a successful procedure. We discussed the risks of the procedure, including infection, bleeding, tissue injury, clip migration, and inadequate sampling. Informed written consent was given. The usual time-out protocol was performed immediately prior to the procedure. Lesion quadrant: Upper outer quadrant Using sterile technique and 1% Lidocaine with and without epinephrine as local anesthetic, under direct ultrasound visualization, a 12 gauge spring-loaded device  was used to perform biopsy of palpable mass at 2:30 position left breast 8 cm from the nipple using a lateral approach. At the conclusion of the procedure ribbon shaped tissue marker clip was deployed into the biopsy cavity. Follow up 2 view mammogram was performed and dictated separately. IMPRESSION: Ultrasound guided biopsy of left breast.  No apparent complications. Electronically Signed: By: Curlene Dolphin M.D. On: 11/17/2019 08:44       IMPRESSION/PLAN: 1. Stage IIA, [id number] grade 2, ER/PR positive invasive ductal carcinoma of the left breast. Dr. Lisbeth Renshaw discusses the pathology findings and reviews the nature of left breast disease. The consensus from the breast conference includes proceeding lumpectomy with sentinel node biopsy given concern for at least 3 nodes, likely greater than 4 is expected. If she has less than 4 positive notes, her node would be tested for mammaprint to determine systemic therapy, but if she has 4 or more nodes, she would receive adjuvant chemotherapy. She would benefit from external radiotherapy to the breast followed by antiestrogen therapy to reduce risks of recurrent disease as well. We discussed the risks, benefits, short, and long term effects of radiotherapy, and the patient is interested in proceeding. Dr. Lisbeth Renshaw discusses the delivery and logistics of radiotherapy and anticipates a course of 6 1/2 weeks of radiotherapy to the left breast and regional nodes with deep inspiration breath hold technique. We will see her back about 2 weeks after surgery to discuss the simulation process and anticipate we starting radiotherapy about 4-6 weeks after surgery.   In a visit lasting 60  minutes, greater than 50% of the time was spent face to face reviewing her case, as well as in preparation of, discussing, and coordinating the patient's care.  The above documentation reflects my direct findings during this shared patient visit. Please see the separate note by Dr. Lisbeth Renshaw on this  date for the remainder of the patient's plan of care.    Carola Rhine, PAC

## 2019-11-29 NOTE — Telephone Encounter (Signed)
Scheduled appts per 4/28 los. Left voicemail with new appt date and time.

## 2019-11-29 NOTE — Therapy (Signed)
Cynthia Vasquez, [place], [address] Phone: [phone]   Fax:  [phone]  Physical Therapy Evaluation  Patient Details  Name: Cynthia Vasquez MRN: [id number] Date of Birth: May 01, 1946 Referring Provider (PT): Dr. Alphonsa Vasquez   Encounter Date: 11/29/2019  PT End of Session - 11/29/19 1103    Visit Number  1    Number of Visits  2    Date for PT Re-Evaluation  01/24/20    PT Start Time  1112    PT Stop Time  1142    PT Time Calculation (min)  30 min    Activity Tolerance  Patient tolerated treatment well    Behavior During Therapy  [facility] for tasks assessed/performed       Past Medical History:  Diagnosis Date  . Atrial septal defect    repaired age 74  . GERD (gastroesophageal reflux disease)   . Headache    migraines until age 3- 74, have them 3-4 x year  . Hypertension   . Vitamin D deficiency     Past Surgical History:  Procedure Laterality Date  . ASD REPAIR     age 74    There were no vitals filed for this visit.   Subjective Assessment - 11/29/19 1055    Subjective  Patient reports she is here today to be seen by her medical team for her newly diagnosed left breast cancer.    Patient is accompained by:  Family member    Pertinent History  Patient was diagnosed on 11/09/2019 with left grade II invasive ductal carcinoma breast cancer. The 2 masses measure 2.9 cm and 1 cm in the upper outer quadrant. They are ER/PR positive and HER2 negative with a Ki67 of 30%. She has 3 enlarged axillary lymph nodes with 1 biopsied and was positive. She had Covid-19 in 06/2019 and was recently seen due to respiratory distress from previous Covid.    Patient Stated Goals  reduce lymphedema risk and learn post op shoulder ROM HEP    Currently in Pain?  No/denies         [facility] PT Assessment - 11/29/19 0001      Assessment   Medical Diagnosis  Left breast cancer    Referring Provider (PT)  Dr. Alphonsa Vasquez    Onset Date/Surgical Date  11/09/19    Hand Dominance  Right    Prior Therapy  none      Precautions   Precautions  Other (comment)    Precaution Comments  active cancer      Restrictions   Weight Bearing Restrictions  No      Balance Screen   Has the patient fallen in the past 6 months  No    Has the patient had a decrease in activity level because of a fear of falling?   No    Is the patient reluctant to leave their home because of a fear of falling?   No      Home Environment   Living Environment  Private residence    Living Arrangements  Spouse/significant other    Available Help at Discharge  Family      Prior Function   Level of [place]  Retired    Leisure  Patient does not exercise      Cognition   Vasquez Cognitive Status  Within Functional Limits for tasks assessed      Posture/Postural Control   Posture/Postural  Control  Postural limitations    Postural Limitations  Rounded Shoulders;Forward head      ROM / Strength   AROM / PROM / Strength  AROM;Strength      AROM   Vasquez AROM Comments  all cervical AROM limited 25% except flexion WNL    AROM Assessment Site  Shoulder    Right/Left Shoulder  Right;Left    Right Shoulder Extension  47 Degrees    Right Shoulder Flexion  154 Degrees    Right Shoulder ABduction  155 Degrees    Right Shoulder Internal Rotation  59 Degrees    Right Shoulder External Rotation  90 Degrees    Left Shoulder Extension  44 Degrees    Left Shoulder Flexion  142 Degrees    Left Shoulder ABduction  142 Degrees    Left Shoulder Internal Rotation  65 Degrees    Left Shoulder External Rotation  85 Degrees      Strength   Vasquez Strength  Within functional limits for tasks performed        LYMPHEDEMA/ONCOLOGY QUESTIONNAIRE - 11/29/19 1101      Type   Cancer Type  Left breast cancer      Lymphedema Assessments   Lymphedema Assessments  Upper extremities      Right Upper Extremity  Lymphedema   10 cm Proximal to Olecranon Process  30.3 cm    Olecranon Process  25.5 cm    10 cm Proximal to Ulnar Styloid Process  23.5 cm    Just Proximal to Ulnar Styloid Process  16 cm    Across Hand at [employer]  19 cm    At [place] of 2nd Digit  6.2 cm      Left Upper Extremity Lymphedema   10 cm Proximal to Olecranon Process  31.7 cm    Olecranon Process  25 cm    10 cm Proximal to Ulnar Styloid Process  23.3 cm    Just Proximal to Ulnar Styloid Process  16 cm    Across Hand at [employer]  18.8 cm    At [place] of 2nd Digit  5.9 cm          Quick Dash - 11/29/19 0001    Open a tight or new jar  Moderate difficulty    Do heavy household chores (wash walls, wash floors)  No difficulty    Carry a shopping bag or briefcase  No difficulty    Wash your back  No difficulty    Use a knife to cut food  No difficulty    Recreational activities in which you take some force or impact through your arm, shoulder, or hand (golf, hammering, tennis)  No difficulty    During the past week, to what extent has your arm, shoulder or hand problem interfered with your normal social activities with family, friends, neighbors, or groups?  Not at all    During the past week, to what extent has your arm, shoulder or hand problem limited your work or other regular daily activities  Not at all    Arm, shoulder, or hand pain.  None    Tingling (pins and needles) in your arm, shoulder, or hand  None    Difficulty Sleeping  No difficulty    DASH Score  4.55 %        Objective measurements completed on examination: See above findings.      Patient was instructed today in a home exercise program today  for post op shoulder range of motion. These included active assist shoulder flexion in sitting, scapular retraction, wall walking with shoulder abduction, and hands behind head external rotation.  She was encouraged to do these twice a day, holding 3 seconds and repeating 5 times when permitted by  her physician.            PT Education - 11/29/19 1148    Education Details  Lymphedema risk reduction and post op shoulder ROM HEP    Person(s) Educated  Patient;Spouse    Methods  Explanation;Demonstration;Handout    Comprehension  Returned demonstration;Verbalized understanding          PT Long Term Goals - 11/29/19 1151      PT LONG TERM GOAL #1   Title  Patient will demonstrate she has regained full shoulder ROM and function post operatively compared to baselines.    Time  8    Period  Weeks    Status  New    Target Date  01/24/20      Breast Clinic Goals - 11/29/19 1151      Patient will be able to verbalize understanding of pertinent lymphedema risk reduction practices relevant to her diagnosis specifically related to skin care.   Time  1    Period  Days    Status  Achieved      Patient will be able to return demonstrate and/or verbalize understanding of the post-op home exercise program related to regaining shoulder range of motion.   Time  1    Period  Days    Status  Achieved      Patient will be able to verbalize understanding of the importance of attending the postoperative After Breast Cancer Class for further lymphedema risk reduction education and therapeutic exercise.   Time  1    Period  Days    Status  Achieved            Plan - 11/29/19 1148    Clinical Impression Statement  Patient was diagnosed on 11/09/2019 with left grade II invasive ductal carcinoma breast cancer. The 2 masses measure 2.9 cm and 1 cm in the upper outer quadrant. They are ER/PR positive and HER2 negative with a Ki67 of 30%. She has 3 enlarged axillary lymph nodes with 1 biopsied and was positive. She had Covid-19 in 06/2019 and was recently seen due to respiratory distress from previous Covid. Her multidisiplinary medical team met prior to her assessments to determine a recommended treatment plan. She is planning to have a left lumpectomy and axillary lymph node  dissection followed by possible chemotherapy (Mammaprint will be done if < 4 positive nodes), radiation and anti-estrogen therapy. She will benefit from a post op PT reassessment and L-Dex screenings every 3 months for 2 years.    Stability/Clinical Decision Making  Stable/Uncomplicated    Clinical Decision Making  Low    Rehab Potential  Excellent    PT Frequency  --   Eval and 1 f/u visit followed by L-Dex screenings every 3 month   PT Treatment/Interventions  ADLs/Self Care Home Management;Therapeutic exercise;Patient/family education    PT Next Visit Plan  Will reassess 3-4 weeks post op to determine needs    PT Home Exercise Plan  Post op shoulder ROM HEP    Consulted and Agree with Plan of Care  Patient;Family member/caregiver    Family Member Consulted  Husband       Patient will benefit from skilled therapeutic intervention in order to  improve the following deficits and impairments:  Postural dysfunction, Decreased range of motion, Impaired UE functional use, Pain, Decreased knowledge of precautions  Visit Diagnosis: Malignant neoplasm of upper-outer quadrant of left breast in female, estrogen receptor positive ([place]) - Plan: PT plan of care cert/re-cert  Abnormal posture - Plan: PT plan of care cert/re-cert   Patient will follow up at outpatient cancer rehab 3-4 weeks following surgery.  If the patient requires physical therapy at that time, a specific plan will be dictated and sent to the referring physician for approval. The patient was educated today on appropriate basic range of motion exercises to begin post operatively and the importance of attending the After Breast Cancer class following surgery.  Patient was educated today on lymphedema risk reduction practices as it pertains to recommendations that will benefit the patient immediately following surgery.  She verbalized good understanding.    The patient was assessed using the L-Dex machine today to produce a lymphedema index  baseline score. The patient will be reassessed on a regular basis (typically every 3 months) to obtain new L-Dex scores. If the score is > 6.5 points away from his/her baseline score indicating onset of subclinical lymphedema, it will be recommended to wear a compression garment for 4 weeks, 12 hours per day and then be reassessed. If the score continues to be > 6.5 points from baseline at reassessment, we will initiate lymphedema treatment. Assessing in this manner has a 95% rate of preventing clinically significant lymphedema.    Problem List Patient Active Problem List   Diagnosis Date Noted  . Malignant neoplasm of upper-outer quadrant of left breast in female, estrogen receptor positive ([place]) 11/24/2019  . Cough 11/15/2019  . Acute respiratory failure with hypoxia ([place]) 06/29/2019  . Acute respiratory failure due to COVID-19 ([place]) 06/28/2019  . Vertigo 06/11/2013  . Hypertension   . GERD (gastroesophageal reflux disease)    Annia Friendly, PT 11/29/19 11:54 AM  [place], [place], [address] Phone: [phone]   Fax:  [phone]  Name: Cynthia Vasquez MRN: [id number] Date of Birth: Jun 15, 1946

## 2019-11-29 NOTE — Patient Instructions (Signed)

## 2019-11-30 ENCOUNTER — Telehealth: Payer: Self-pay | Admitting: Oncology

## 2019-11-30 NOTE — Telephone Encounter (Signed)
Returned patients call. Went over follow up appt with pt from 4/28 los. Pt had question regarding surgery, I transferred pt to [facility] nurse.

## 2019-12-02 DIAGNOSIS — U071 COVID-19: Secondary | ICD-10-CM | POA: Diagnosis not present

## 2019-12-04 ENCOUNTER — Ambulatory Visit: Payer: Medicare HMO | Attending: Internal Medicine

## 2019-12-04 ENCOUNTER — Other Ambulatory Visit (HOSPITAL_COMMUNITY): Payer: Self-pay | Admitting: Surgery

## 2019-12-04 DIAGNOSIS — Z23 Encounter for immunization: Secondary | ICD-10-CM

## 2019-12-04 DIAGNOSIS — C50912 Malignant neoplasm of unspecified site of left female breast: Secondary | ICD-10-CM

## 2019-12-04 NOTE — Progress Notes (Signed)
   Covid-19 Vaccination Clinic  Name:  NARYA LUQUIN    MRN: [id number] DOB: 05-Dec-1945  12/04/2019  Ms. Dotterweich was observed post Covid-19 immunization for 15 minutes without incident. She was provided with Vaccine Information Sheet and instruction to access the V-Safe system.   Ms. Reaves was instructed to call 911 with any severe reactions post vaccine: Marland Kitchen Difficulty breathing  . Swelling of face and throat  . A fast heartbeat  . A bad rash all over body  . Dizziness and weakness   Immunizations Administered    Name Date Dose VIS Date Route   Pfizer COVID-19 Vaccine 12/04/2019 10:55 AM 0.3 mL 09/27/2018 Intramuscular   Manufacturer: [place]   Lot: [id number]   [place]: [id number]

## 2019-12-07 ENCOUNTER — Encounter: Payer: Self-pay | Admitting: *Deleted

## 2019-12-08 ENCOUNTER — Telehealth: Payer: Self-pay | Admitting: *Deleted

## 2019-12-08 ENCOUNTER — Telehealth: Payer: Self-pay | Admitting: Oncology

## 2019-12-08 NOTE — Telephone Encounter (Signed)
Received message from one of our schedulers that patient had a question regarding an antibiotic she was taking. Left message for patient to return my call.

## 2019-12-08 NOTE — Telephone Encounter (Signed)
Unable to reach pt. Rescheduled appt per Varney Biles 5/7 sch msg. -left voicemail- rescheduled appt to 7/8.

## 2019-12-12 NOTE — Progress Notes (Unsigned)
Nutrition  Patient identified by attending Breast Clinic on 11/29/19.  Patient was given nutrition packet with contact information at that time.   Called patient to introduce self and service at [facility].  No answer.  Left message with call back number  Geneveive Furness B. Zenia Resides, [place], [place] Registered Dietitian [phone] (pager)

## 2020-01-02 DIAGNOSIS — U071 COVID-19: Secondary | ICD-10-CM | POA: Diagnosis not present

## 2020-01-02 DIAGNOSIS — C801 Malignant (primary) neoplasm, unspecified: Secondary | ICD-10-CM

## 2020-01-02 HISTORY — DX: Malignant (primary) neoplasm, unspecified: C80.1

## 2020-01-09 NOTE — Progress Notes (Signed)
[place], [place] - [address] [place] [place] [address] Phone: [phone] Fax: [phone]  [employer] [facility] for [place], [place] [address] Phone: [phone] Fax: [phone]    Your procedure is scheduled on Friday, June 11th.  Report to [facility] Main Entrance "A" at 5:30 A.M., and check in at the Admitting office.  Call this number if you have problems the morning of surgery:  [phone]  Call [phone] if you have any questions prior to your surgery date Monday-Friday 8am-4pm   Remember:  Do not eat after midnight the night before your surgery  You may drink clear liquids until 4:30 A.M. the morning of your surgery.   Clear liquids allowed are: Water, Non-Citrus Juices (without pulp), Carbonated Beverages, Clear Tea, Black Coffee Only, and Gatorade   Please complete your PRE-SURGERY ENSURE that was provided to you by 4:30 A.M. the morning of surgery.  Please, if able, drink it in one setting. DO NOT SIP.   Take these medicines the morning of surgery with A SIP OF WATER pantoprazole (PROTONIX)   If needed - acetaminophen (TYLENOL)  As of today, STOP taking any Aspirin (unless otherwise instructed by your surgeon) and Aspirin containing products, Aleve, Naproxen, Ibuprofen, Motrin, Advil, Goody's, BC's, all herbal medications, fish oil, and all vitamins.             Do not wear jewelry, make up, or nail polish            Do not wear lotions, powders, perfumes or deodorant.            Do not shave 48 hours prior to surgery.              Do not bring valuables to the hospital.            [facility] is not responsible for any belongings or valuables.  Do NOT Smoke (Tobacco/Vapping) or drink Alcohol 24 hours prior to your procedure If you use a CPAP at night, you may bring all equipment for your overnight stay.   Contacts, glasses, dentures or  bridgework may not be worn into surgery.      For patients admitted to the hospital, discharge time will be determined by your treatment team.   Patients discharged the day of surgery will not be allowed to drive home, and someone needs to stay with them for 24 hours.  Special instructions:   [place]- Preparing For Surgery  Before surgery, you can play an important role. Because skin is not sterile, your skin needs to be as free of germs as possible. You can reduce the number of germs on your skin by washing with CHG (chlorahexidine gluconate) Soap before surgery.  CHG is an antiseptic cleaner which kills germs and bonds with the skin to continue killing germs even after washing.    Oral Hygiene is also important to reduce your risk of infection.  Remember - BRUSH YOUR TEETH THE MORNING OF SURGERY WITH YOUR REGULAR TOOTHPASTE  Please do not use if you have an allergy to CHG or antibacterial soaps. If your skin becomes reddened/irritated stop using the CHG.  Do not shave (including legs and underarms) for at least 48 hours prior to first CHG shower. It is OK to shave your face.  Please follow these instructions carefully.   1. Shower the NIGHT BEFORE SURGERY and the MORNING OF SURGERY  with CHG Soap.   2. If you chose to wash your hair, wash your hair first as usual with your normal shampoo.  3. After you shampoo, rinse your hair and body thoroughly to remove the shampoo.  4. Use CHG as you would any other liquid soap. You can apply CHG directly to the skin and wash gently with a scrungie or a clean washcloth.   5. Apply the CHG Soap to your body ONLY FROM THE NECK DOWN.  Do not use on open wounds or open sores. Avoid contact with your eyes, ears, mouth and genitals (private parts). Wash Face and genitals (private parts)  with your normal soap.   6. Wash thoroughly, paying special attention to the area where your surgery will be performed.  7. Thoroughly rinse your body with warm  water from the neck down.  8. DO NOT shower/wash with your normal soap after using and rinsing off the CHG Soap.  9. Pat yourself dry with a CLEAN TOWEL.  10. Wear CLEAN PAJAMAS to bed the night before surgery, wear comfortable clothes the morning of surgery  11. Place CLEAN SHEETS on your bed the night of your first shower and DO NOT SLEEP WITH PETS.  Day of Surgery: Shower with CHG soap as instructed above.  Do not apply any deodorants/lotions.  Please wear clean clothes to the hospital/surgery center.   Remember to brush your teeth WITH YOUR REGULAR TOOTHPASTE.   Please read over the following fact sheets that you were given.

## 2020-01-10 ENCOUNTER — Encounter (HOSPITAL_COMMUNITY): Payer: Self-pay

## 2020-01-10 ENCOUNTER — Other Ambulatory Visit (HOSPITAL_COMMUNITY)
Admission: RE | Admit: 2020-01-10 | Discharge: 2020-01-10 | Disposition: A | Discharge status: Home / Self Care | Payer: Medicare HMO | Source: Ambulatory Visit | Attending: Surgery | Admitting: Surgery

## 2020-01-10 ENCOUNTER — Encounter (HOSPITAL_COMMUNITY)
Admission: RE | Admit: 2020-01-10 | Discharge: 2020-01-10 | Disposition: A | Discharge status: Home / Self Care | Payer: Medicare HMO | Source: Ambulatory Visit | Attending: Surgery | Admitting: Surgery

## 2020-01-10 ENCOUNTER — Other Ambulatory Visit: Payer: Self-pay

## 2020-01-10 DIAGNOSIS — Z8774 Personal history of (corrected) congenital malformations of heart and circulatory system: Secondary | ICD-10-CM | POA: Diagnosis not present

## 2020-01-10 DIAGNOSIS — Z20822 Contact with and (suspected) exposure to covid-19: Secondary | ICD-10-CM | POA: Diagnosis not present

## 2020-01-10 DIAGNOSIS — Z8616 Personal history of COVID-19: Secondary | ICD-10-CM | POA: Insufficient documentation

## 2020-01-10 DIAGNOSIS — Z01812 Encounter for preprocedural laboratory examination: Secondary | ICD-10-CM | POA: Insufficient documentation

## 2020-01-10 DIAGNOSIS — C50912 Malignant neoplasm of unspecified site of left female breast: Secondary | ICD-10-CM | POA: Insufficient documentation

## 2020-01-10 DIAGNOSIS — I10 Essential (primary) hypertension: Secondary | ICD-10-CM | POA: Insufficient documentation

## 2020-01-10 DIAGNOSIS — Z01818 Encounter for other preprocedural examination: Secondary | ICD-10-CM | POA: Diagnosis not present

## 2020-01-10 DIAGNOSIS — Z79899 Other long term (current) drug therapy: Secondary | ICD-10-CM | POA: Insufficient documentation

## 2020-01-10 DIAGNOSIS — K219 Gastro-esophageal reflux disease without esophagitis: Secondary | ICD-10-CM | POA: Insufficient documentation

## 2020-01-10 HISTORY — DX: Personal history of urinary calculi: Z87.442

## 2020-01-10 HISTORY — DX: Personal history of other medical treatment: Z92.89

## 2020-01-10 HISTORY — DX: Unspecified glaucoma: H40.9

## 2020-01-10 LAB — SARS CORONAVIRUS 2 (TAT 6-24 HRS): SARS Coronavirus 2: NEGATIVE

## 2020-01-10 LAB — BASIC METABOLIC PANEL
Anion gap: 11 (ref 5–15)
BUN: 30 mg/dL — ABNORMAL HIGH (ref 8–23)
CO2: 25 mmol/L (ref 22–32)
Calcium: 9.6 mg/dL (ref 8.9–10.3)
Chloride: 103 mmol/L (ref 98–111)
Creatinine, Ser: 1.03 mg/dL — ABNORMAL HIGH (ref 0.44–1.00)
GFR calc Af Amer: >60 mL/min (ref >60)
GFR calc non Af Amer: 54 mL/min — ABNORMAL LOW (ref >60)
Glucose, Bld: 99 mg/dL (ref 70–99)
Potassium: 4.3 mmol/L (ref 3.5–5.1)
Sodium: 139 mmol/L (ref 135–145)

## 2020-01-10 LAB — CBC
HCT: 39 % (ref 36.0–46.0)
Hemoglobin: 12.7 g/dL (ref 12.0–15.0)
MCH: 29.5 pg (ref 26.0–34.0)
MCHC: 32.6 g/dL (ref 30.0–36.0)
MCV: 90.5 fL (ref 80.0–100.0)
Platelets: 315 10*3/uL (ref 150–400)
RBC: 4.31 MIL/uL (ref 3.87–5.11)
RDW: 13.7 % (ref 11.5–15.5)
WBC: 6.7 10*3/uL (ref 4.0–10.5)
nRBC: 0 % (ref 0.0–0.2)

## 2020-01-10 NOTE — Progress Notes (Signed)
Patient denies shortness of breath, fever, cough or chest pain.  PCP - Dr Francesco Sor Cardiologist - n/s Pulmonology- Wyn Quaker, NP  Chest x-ray - 11/15/19 (2V) EKG - 06/28/19 Stress Test - n/a ECHO - n/a Cardiac Cath - n/a  ERAS: Clears til 4:30 am DOS, Ensure drink given at PAT visit.  Anesthesia review: Yes   Coronavirus Screening Covid test scheduled on Wed., 01/10/20. Do you have any of the following symptoms:  Cough yes/no: No Fever (>100.44F)  yes/no: No Runny nose yes/no: No Sore throat yes/no: No Difficulty breathing/shortness of breath  yes/no: No  Have you traveled in the last 14 days and where? yes/no: No  Patient verbalized understanding of instructions that were given to them at the PAT appointment.

## 2020-01-11 ENCOUNTER — Ambulatory Visit
Admission: RE | Admit: 2020-01-11 | Discharge: 2020-01-11 | Disposition: A | Discharge status: Home / Self Care | Payer: Medicare HMO | Source: Ambulatory Visit | Attending: Surgery | Admitting: Surgery

## 2020-01-11 DIAGNOSIS — C50912 Malignant neoplasm of unspecified site of left female breast: Secondary | ICD-10-CM

## 2020-01-11 DIAGNOSIS — C50412 Malignant neoplasm of upper-outer quadrant of left female breast: Secondary | ICD-10-CM | POA: Diagnosis not present

## 2020-01-11 NOTE — Progress Notes (Addendum)
Anesthesia Chart Review:  Hx of ASD repair at age 74. She is not currently followed by cardiology. Review of notes available in Epic does not indicate any hx of cardiovascular disease. Reviewed this history with Dr. Fransisco Beau. Advised she is okay to proceed barring any acute symptomatic change.   December 2020 she was admitted for acute hypoxic resp failure due to covid 19 PNA. Treated with remdesivir and steroids. She did require supplemental oxygen during admission. Since discharge she has continued to follow with pulmonology. She did not require supplemental O2 post discharge.  She was last seen 11/15/2019 and per note she reported doing well with no acute issues.  Reported occasional dry cough, but that was felt due to noncompliance with GERD therapy.  Patient admitted trying to wean herself off PPI only taking every other day.  She was advised to resume PPI daily.  Chest x-ray done at that visit was clear, no acute abnormalities.  PFTs were also done: FVC 1.96 (77% predicted), postbronchodilator ratio 88, postbronchodilator FEV1 1.71 (90% predicted), no bronchodilator response, DLCO 16.37 (94% predicted).  She was advised to follow-up in 6 months.  Preop labs reviewed, unremarkable.  EKG 06/28/2019: Sinus arrhythmia.  Rate 62.  Cynthia Vasquez [facility] Short Stay Center/Anesthesiology Phone [phone] 01/11/2020 8:57 AM

## 2020-01-11 NOTE — Anesthesia Preprocedure Evaluation (Addendum)
Anesthesia Evaluation  Patient identified by MRN, date of birth, ID band Patient awake    Reviewed: Allergy & Precautions, NPO status , Patient's Chart, lab work & pertinent test results  History of Anesthesia Complications Negative for: history of anesthetic complications  Airway Mallampati: II  TM Distance: >3 FB Neck ROM: Full    Dental  (+) Missing,    Pulmonary neg pulmonary ROS,    Pulmonary exam normal        Cardiovascular hypertension, Pt. on medications Normal cardiovascular exam     Neuro/Psych negative neurological ROS  negative psych ROS   GI/Hepatic Neg liver ROS, GERD  Medicated and Controlled,  Endo/Other  negative endocrine ROS  Renal/GU negative Renal ROS  negative genitourinary   Musculoskeletal negative musculoskeletal ROS (+)   Abdominal   Peds  Hematology negative hematology ROS (+)   Anesthesia Other Findings Left breast ca  Reproductive/Obstetrics negative OB ROS                           Anesthesia Physical Anesthesia Plan  ASA: II  Anesthesia Plan: General   Post-op Pain Management: GA combined w/ Regional for post-op pain   Induction: Intravenous  PONV Risk Score and Plan: 3 and Treatment may vary due to age or medical condition, Ondansetron and Dexamethasone  Airway Management Planned: LMA  Additional Equipment: None  Intra-op Plan:   Post-operative Plan: Extubation in OR  Informed Consent: I have reviewed the patients History and Physical, chart, labs and discussed the procedure including the risks, benefits and alternatives for the proposed anesthesia with the patient or authorized representative who has indicated his/her understanding and acceptance.     Dental advisory given  Plan Discussed with: CRNA  Anesthesia Plan Comments: (See PAT note by Karoline Caldwell, PA-C )     Anesthesia Quick Evaluation

## 2020-01-11 NOTE — H&P (Signed)
Cynthia Vasquez  Location: [facility] Patient #: [id number] DOB: [birth year] Married / Language: English / Race: White Female  History of Present Illness   The patient is a 74 year old female who presents with a complaint of breast cancer.  The PCP is Dr. Van Clines  The patient is at the [facility] - Oncology is Drs. Magrinat/Moody  She is with her husband Joe.  Her last mammogram was 3 years ago. She felt something in her left breast and this prompted her mammograms. She has no family history of breast cancer. She is not on hormones.  [facility]: [facility] - [facility]- left breast mass at 2:30 o'clock - 1.9 x 1.3 x 2.9 cm, adjacent hypoechoic mass 0.3 x 0.4 x 1.0 cm, 3 enlarged left axillary nodes Biopsy: 11/17/2019 - [id number] - IDC, ER - 95%, PR - 95%, Ki67 - 30%, Her2Neu - neg, lymph node - POSITIVE Family history of breast or ovarian cancer: No On hormone therapy: No  I discussed the options for breast cancer treatment with the patient. The patient is at the [facility], which includes medical oncology and radiation oncology. I discussed the surgical options of lumpectomy vs. mastectomy. If mastectomy, there is the possibility of reconstruction. I discussed the options of lymph node biopsy. The treatment plan depends on the pathologic staging of the tumor and the patient's personal wishes. The risks of surgery include, but are not limited to, bleeding, infection, the need for further surgery, and nerve injury. The patient has been given literature on the treatment of breast cancer. Since two masses are mentioned by [place], I talked to Dr. Miquel Dunn - who said the second mass is adjacent and deep. I do not need a second seed.  Plan: 1. Left breast lumpectomy with left axillary node dissection, 2. If 3 or less nodes - Mammoprint, if 4 or more nodes - chemotherapy, 3. Radiation therapy, 4.  Antihormone  Past Medical History: 1. Hospitalized in Dec 2020 with Covid She is pretty much over this. But she said the one effect that she still has is trouble sleeping at night. 2. HTN 3. 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 5. Last colonoscopy by Dr. Michail Sermon around 7 to 8 years ago.  Social History: Husband Denice Paradise Children: [name] - 76 yo, in [place], [place] - 74 yo, Remo Lipps - 74 yo They used to live in [place], but moved several years ago.  I have personally seen and evaluated the patient, evaluated laboratory and imaging results, formulated the assessment and plan and placed orders. This requires moderate/high medical decision making. Total time spent with patient and charting: 50 minutes  Past Surgical History Conni Slipper, RN; 11/29/2019 7:18 AM) Breast Biopsy  Left.  Diagnostic Studies History Conni Slipper, RN; 11/29/2019 7:18 AM) Colonoscopy  5-10 years ago 1-5 years ago Mammogram  1-3 years ago Pap Smear  1-5 years ago  Medication History Conni Slipper, RN; 11/29/2019 7:19 AM) Medications Reconciled  Social History Conni Slipper, RN; 11/29/2019 7:18 AM) Caffeine use  Carbonated beverages, Tea. No alcohol use  No drug use  Tobacco use  Never smoker.  Family History Conni Slipper, RN; 11/29/2019 7:18 AM) Heart Disease  Father. Migraine Headache  Mother.  Pregnancy / Birth History Conni Slipper, RN; 11/29/2019 7:18 AM) Age at menarche  62 years. Age of menopause  33-50 Contraceptive History  Oral contraceptives. Gravida  3 Irregular periods  Length (months) of breastfeeding  3-6 Maternal age  47-30 Para  66  Other Problems Conni Slipper, RN; 11/29/2019 7:18 AM) Gastroesophageal Reflux Disease  High blood pressure   Review of Systems Conni Slipper RN; 11/29/2019 7:18 AM) General Not Present- Appetite Loss, Chills, Fatigue, Fever, Night Sweats, Weight Gain and Weight Loss. Skin Not Present- Change in Wart/Mole, Dryness, Hives,  Jaundice, New Lesions, Non-Healing Wounds, Rash and Ulcer. HEENT Present- Seasonal Allergies and Wears glasses/contact lenses. Not Present- Earache, Hearing Loss, Hoarseness, Nose Bleed, Oral Ulcers, Ringing in the Ears, Sinus Pain, Sore Throat, Visual Disturbances and Yellow Eyes. Breast Not Present- Breast Mass, Breast Pain, Nipple Discharge and Skin Changes. Cardiovascular Not Present- Chest Pain, Difficulty Breathing Lying Down, Leg Cramps, Palpitations, Rapid Heart Rate, Shortness of Breath and Swelling of Extremities. Gastrointestinal Not Present- Abdominal Pain, Bloating, Bloody Stool, Change in Bowel Habits, Chronic diarrhea, Constipation, Difficulty Swallowing, Excessive gas, Gets full quickly at meals, Hemorrhoids, Indigestion, Nausea, Rectal Pain and Vomiting. Female Genitourinary Not Present- Frequency, Nocturia, Painful Urination, Pelvic Pain and Urgency. Musculoskeletal Present- Muscle Pain. Not Present- Back Pain, Joint Pain, Joint Stiffness, Muscle Weakness and Swelling of Extremities. Neurological Not Present- Decreased Memory, Fainting, Headaches, Numbness, Seizures, Tingling, Tremor, Trouble walking and Weakness. Psychiatric Not Present- Anxiety, Bipolar, Change in Sleep Pattern, Depression, Fearful and Frequent crying. Endocrine Not Present- Cold Intolerance, Excessive Hunger, Hair Changes, Heat Intolerance, Hot flashes and New Diabetes. Hematology Not Present- Blood Thinners, Easy Bruising, Excessive bleeding, Gland problems, HIV and Persistent Infections.   Physical Exam  General: WN overweight WF who is alert and generally healthy appearing. HEENT: Normal. Pupils equal.  Neck: Supple. No mass. No thyroid mass. Lymph Nodes: No supraclavicular or cervical nodes.  In her left axilla, I can feel a 2 cm axillary node  Lungs: Clear to auscultation and symmetric breath sounds. Heart: RRR. No murmur or rub.  Breasts: Right - she has a scar under her right breast from her ASD  repair, she has no right breast mass  Left - she has a 2.5 cm mass at the 2:30 o'clock position in the left breast about 9 cm from the areola with skin dimpling  Abdomen: Soft. No mass. No tenderness. No hernia. Normal bowel sounds. No abdominal scars. Rectal: Not done.  Extremities: Good strength and ROM in upper and lower extremities.  Neurologic: Grossly intact to motor and sensory function. Psychiatric: Has normal mood and affect. Behavior is normal.  Assessment & Plan  1.  BREAST CANCER, STAGE 2, LEFT (C50.912)  Story: [id number] - IDC, ER - 95%, PR - 95%, Ki67 - 30%, Her2Neu - neg, lymph node - POSITIVE  Oncology - Magrinat/Moody  Plan:  1. Left breast lumpectomy with left axillary node dissection,  2. If 3 or less nodes - Mammoprint, if 4 or more nodes - chemotherapy,   3. Radiation therapy,  4. Antihormone  2.  HYPERTENSION (I10) 3. Hospitalized in Dec 2020 with Covid She is pretty much over this. But she said the one effect that she still has is trouble sleeping at night. 4. GERD 5. 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   Alphonsa Overall, MD, [facility] Office phone:  [phone]

## 2020-01-12 ENCOUNTER — Ambulatory Visit (HOSPITAL_COMMUNITY): Payer: Medicare HMO | Admitting: Physician Assistant

## 2020-01-12 ENCOUNTER — Ambulatory Visit (HOSPITAL_COMMUNITY)
Admission: RE | Admit: 2020-01-12 | Discharge: 2020-01-12 | Disposition: A | Discharge status: Home / Self Care | Payer: Medicare HMO | Attending: Surgery | Admitting: Surgery

## 2020-01-12 ENCOUNTER — Ambulatory Visit (HOSPITAL_COMMUNITY): Payer: Medicare HMO | Admitting: Certified Registered"

## 2020-01-12 ENCOUNTER — Ambulatory Visit
Admission: RE | Admit: 2020-01-12 | Discharge: 2020-01-12 | Disposition: A | Discharge status: Home / Self Care | Payer: Medicare HMO | Source: Ambulatory Visit | Attending: Surgery | Admitting: Surgery

## 2020-01-12 ENCOUNTER — Other Ambulatory Visit: Payer: Self-pay

## 2020-01-12 ENCOUNTER — Encounter (HOSPITAL_COMMUNITY): Payer: Self-pay | Admitting: Surgery

## 2020-01-12 ENCOUNTER — Encounter (HOSPITAL_COMMUNITY)
Admission: RE | Disposition: A | Discharge status: Home / Self Care | Payer: Self-pay | Source: Home / Self Care | Attending: Surgery

## 2020-01-12 DIAGNOSIS — C50912 Malignant neoplasm of unspecified site of left female breast: Secondary | ICD-10-CM

## 2020-01-12 DIAGNOSIS — Z8616 Personal history of COVID-19: Secondary | ICD-10-CM | POA: Insufficient documentation

## 2020-01-12 DIAGNOSIS — C50412 Malignant neoplasm of upper-outer quadrant of left female breast: Secondary | ICD-10-CM | POA: Insufficient documentation

## 2020-01-12 DIAGNOSIS — Z170 Estrogen receptor positive status [ER+]: Secondary | ICD-10-CM | POA: Diagnosis not present

## 2020-01-12 DIAGNOSIS — E663 Overweight: Secondary | ICD-10-CM | POA: Insufficient documentation

## 2020-01-12 DIAGNOSIS — K219 Gastro-esophageal reflux disease without esophagitis: Secondary | ICD-10-CM | POA: Diagnosis not present

## 2020-01-12 DIAGNOSIS — I10 Essential (primary) hypertension: Secondary | ICD-10-CM | POA: Diagnosis not present

## 2020-01-12 DIAGNOSIS — C773 Secondary and unspecified malignant neoplasm of axilla and upper limb lymph nodes: Secondary | ICD-10-CM | POA: Diagnosis not present

## 2020-01-12 DIAGNOSIS — G8918 Other acute postprocedural pain: Secondary | ICD-10-CM | POA: Diagnosis not present

## 2020-01-12 DIAGNOSIS — Z6830 Body mass index (BMI) 30.0-30.9, adult: Secondary | ICD-10-CM | POA: Diagnosis not present

## 2020-01-12 DIAGNOSIS — N6012 Diffuse cystic mastopathy of left breast: Secondary | ICD-10-CM | POA: Diagnosis not present

## 2020-01-12 HISTORY — PX: BREAST LUMPECTOMY WITH RADIOACTIVE SEED AND AXILLARY LYMPH NODE DISSECTION: SHX6656

## 2020-01-12 SURGERY — BREAST LUMPECTOMY WITH RADIOACTIVE SEED AND AXILLARY LYMPH NODE DISSECTION
Anesthesia: General | Site: Breast | Laterality: Left

## 2020-01-12 MED ORDER — FENTANYL CITRATE (PF) 100 MCG/2ML IJ SOLN: 25.0000 ug | INTRAMUSCULAR | Status: DC | PRN

## 2020-01-12 MED ORDER — PROMETHAZINE HCL 25 MG/ML IJ SOLN: 6.2500 mg | INTRAMUSCULAR | Status: DC | PRN

## 2020-01-12 MED ORDER — DEXAMETHASONE SODIUM PHOSPHATE 4 MG/ML IJ SOLN
INTRAMUSCULAR | Status: DC | PRN
  Administered 2020-01-12: 10 mg via INTRAVENOUS

## 2020-01-12 MED ORDER — BUPIVACAINE HCL (PF) 0.25 % IJ SOLN
INTRAMUSCULAR | Status: AC
  Filled 2020-01-12: qty 30

## 2020-01-12 MED ORDER — PROPOFOL 10 MG/ML IV BOLUS
INTRAVENOUS | Status: DC | PRN
  Administered 2020-01-12: 150 mg via INTRAVENOUS

## 2020-01-12 MED ORDER — EPHEDRINE 5 MG/ML INJ
INTRAVENOUS | Status: AC
  Filled 2020-01-12: qty 10

## 2020-01-12 MED ORDER — CHLORHEXIDINE GLUCONATE 4 % EX LIQD: 60.0000 mL | Freq: Once | CUTANEOUS | Status: DC

## 2020-01-12 MED ORDER — PHENYLEPHRINE HCL-NACL 10-0.9 MG/250ML-% IV SOLN
INTRAVENOUS | Status: DC | PRN
  Administered 2020-01-12: 30 ug/min via INTRAVENOUS

## 2020-01-12 MED ORDER — METHYLENE BLUE 0.5 % INJ SOLN
INTRAVENOUS | Status: AC
  Filled 2020-01-12: qty 10

## 2020-01-12 MED ORDER — EPHEDRINE SULFATE-NACL 50-0.9 MG/10ML-% IV SOSY
PREFILLED_SYRINGE | INTRAVENOUS | Status: DC | PRN
  Administered 2020-01-12: 5 mg via INTRAVENOUS

## 2020-01-12 MED ORDER — HYDROCODONE-ACETAMINOPHEN 5-325 MG PO TABS
1.0000 | ORAL_TABLET | Freq: Four times a day (QID) | ORAL | 0 refills | Status: DC | PRN
Start: 2020-01-12 — End: 2020-01-26

## 2020-01-12 MED ORDER — PROPOFOL 10 MG/ML IV BOLUS
INTRAVENOUS | Status: AC
  Filled 2020-01-12: qty 40

## 2020-01-12 MED ORDER — OXYCODONE HCL 5 MG/5ML PO SOLN: 5.0000 mg | Freq: Once | ORAL | Status: AC | PRN

## 2020-01-12 MED ORDER — ORAL CARE MOUTH RINSE: 15.0000 mL | Freq: Once | OROMUCOSAL | Status: AC

## 2020-01-12 MED ORDER — PHENYLEPHRINE 40 MCG/ML (10ML) SYRINGE FOR IV PUSH (FOR BLOOD PRESSURE SUPPORT)
PREFILLED_SYRINGE | INTRAVENOUS | Status: DC | PRN
  Administered 2020-01-12: 40 ug via INTRAVENOUS
  Administered 2020-01-12 (×2): 80 ug via INTRAVENOUS

## 2020-01-12 MED ORDER — CEFAZOLIN SODIUM-DEXTROSE 2-4 GM/100ML-% IV SOLN
2.0000 g | INTRAVENOUS | Status: AC
  Administered 2020-01-12: 2 g via INTRAVENOUS
  Filled 2020-01-12: qty 100

## 2020-01-12 MED ORDER — OXYCODONE HCL 5 MG PO TABS
5.0000 mg | ORAL_TABLET | Freq: Once | ORAL | Status: AC | PRN
  Administered 2020-01-12: 5 mg via ORAL

## 2020-01-12 MED ORDER — CHLORHEXIDINE GLUCONATE 0.12 % MT SOLN
15.0000 mL | Freq: Once | OROMUCOSAL | Status: AC
  Administered 2020-01-12: 15 mL via OROMUCOSAL
  Filled 2020-01-12: qty 15

## 2020-01-12 MED ORDER — MIDAZOLAM HCL 5 MG/5ML IJ SOLN
INTRAMUSCULAR | Status: DC | PRN
  Administered 2020-01-12: 2 mg via INTRAVENOUS

## 2020-01-12 MED ORDER — BUPIVACAINE LIPOSOME 1.3 % IJ SUSP
INTRAMUSCULAR | Status: DC | PRN
Start: 2020-01-12 — End: 2020-01-12
  Administered 2020-01-12: 10 mL via PERINEURAL

## 2020-01-12 MED ORDER — OXYCODONE HCL 5 MG PO TABS
ORAL_TABLET | ORAL | Status: AC
  Filled 2020-01-12: qty 1

## 2020-01-12 MED ORDER — BUPIVACAINE-EPINEPHRINE (PF) 0.5% -1:200000 IJ SOLN
INTRAMUSCULAR | Status: DC | PRN
  Administered 2020-01-12: 20 mL via PERINEURAL

## 2020-01-12 MED ORDER — BUPIVACAINE-EPINEPHRINE 0.5% -1:200000 IJ SOLN
INTRAMUSCULAR | Status: AC
  Filled 2020-01-12: qty 1

## 2020-01-12 MED ORDER — LIDOCAINE 2% (20 MG/ML) 5 ML SYRINGE
INTRAMUSCULAR | Status: AC
  Filled 2020-01-12: qty 5

## 2020-01-12 MED ORDER — BUPIVACAINE HCL 0.25 % IJ SOLN
INTRAMUSCULAR | Status: DC | PRN
  Administered 2020-01-12: 20 mL

## 2020-01-12 MED ORDER — SCOPOLAMINE 1 MG/3DAYS TD PT72
1.0000 | MEDICATED_PATCH | TRANSDERMAL | Status: DC
  Administered 2020-01-12: 1.5 mg via TRANSDERMAL
  Filled 2020-01-12: qty 1

## 2020-01-12 MED ORDER — LIDOCAINE 2% (20 MG/ML) 5 ML SYRINGE
INTRAMUSCULAR | Status: DC | PRN
  Administered 2020-01-12: 80 mg via INTRAVENOUS

## 2020-01-12 MED ORDER — ACETAMINOPHEN 500 MG PO TABS
1000.0000 mg | ORAL_TABLET | Freq: Once | ORAL | Status: AC
  Administered 2020-01-12: 1000 mg via ORAL
  Filled 2020-01-12: qty 2

## 2020-01-12 MED ORDER — MIDAZOLAM HCL 2 MG/2ML IJ SOLN
INTRAMUSCULAR | Status: AC
  Filled 2020-01-12: qty 2

## 2020-01-12 MED ORDER — FENTANYL CITRATE (PF) 100 MCG/2ML IJ SOLN
INTRAMUSCULAR | Status: DC | PRN
  Administered 2020-01-12 (×2): 50 ug via INTRAVENOUS

## 2020-01-12 MED ORDER — ONDANSETRON HCL 4 MG/2ML IJ SOLN
INTRAMUSCULAR | Status: DC | PRN
  Administered 2020-01-12: 4 mg via INTRAVENOUS

## 2020-01-12 MED ORDER — LACTATED RINGERS IV SOLN: INTRAVENOUS | Status: DC | PRN

## 2020-01-12 MED ORDER — 0.9 % SODIUM CHLORIDE (POUR BTL) OPTIME
TOPICAL | Status: DC | PRN
  Administered 2020-01-12: 1000 mL

## 2020-01-12 MED ORDER — FENTANYL CITRATE (PF) 250 MCG/5ML IJ SOLN
INTRAMUSCULAR | Status: AC
  Filled 2020-01-12: qty 5

## 2020-01-12 SURGICAL SUPPLY — 54 items
ADH SKN CLS APL DERMABOND .7 (GAUZE/BANDAGES/DRESSINGS) ×1
APL PRP STRL LF DISP 70% ISPRP (MISCELLANEOUS) ×1
APPLIER CLIP 11 MED OPEN (CLIP) ×3
APPLIER CLIP 9.375 SM OPEN (CLIP) ×3
APR CLP MED 11 20 MLT OPN (CLIP) ×1
APR CLP SM 9.3 20 MLT OPN (CLIP) ×1
BINDER BREAST LRG (GAUZE/BANDAGES/DRESSINGS) IMPLANT
BINDER BREAST XLRG (GAUZE/BANDAGES/DRESSINGS) ×2 IMPLANT
BIOPATCH RED 1 DISK 7.0 (GAUZE/BANDAGES/DRESSINGS) ×2 IMPLANT
BIOPATCH RED 1IN DISK 7.0MM (GAUZE/BANDAGES/DRESSINGS) ×1
CANISTER SUCT 3000ML PPV (MISCELLANEOUS) IMPLANT
CHLORAPREP W/TINT 26 (MISCELLANEOUS) ×3 IMPLANT
CLIP APPLIE 11 MED OPEN (CLIP) IMPLANT
CLIP APPLIE 9.375 SM OPEN (CLIP) IMPLANT
CLIP VESOCCLUDE SM WIDE 6/CT (CLIP) ×3 IMPLANT
COVER PROBE W GEL 5X96 (DRAPES) ×3 IMPLANT
COVER SURGICAL LIGHT HANDLE (MISCELLANEOUS) ×3 IMPLANT
COVER WAND RF STERILE (DRAPES) ×1 IMPLANT
DECANTER SPIKE VIAL GLASS SM (MISCELLANEOUS) ×3 IMPLANT
DERMABOND ADVANCED (GAUZE/BANDAGES/DRESSINGS) ×2
DERMABOND ADVANCED .7 DNX12 (GAUZE/BANDAGES/DRESSINGS) ×1 IMPLANT
DEVICE DUBIN SPECIMEN MAMMOGRA (MISCELLANEOUS) ×3 IMPLANT
DRAIN CHANNEL 19F RND (DRAIN) ×2 IMPLANT
DRAPE CHEST BREAST 15X10 FENES (DRAPES) ×3 IMPLANT
DRAPE HALF SHEET 70X43 (DRAPES) ×3 IMPLANT
DRSG TEGADERM 4X4.75 (GAUZE/BANDAGES/DRESSINGS) ×2 IMPLANT
ELECT COATED BLADE 2.86 ST (ELECTRODE) ×3 IMPLANT
ELECT REM PT RETURN 9FT ADLT (ELECTROSURGICAL) ×3
ELECTRODE REM PT RTRN 9FT ADLT (ELECTROSURGICAL) ×1 IMPLANT
EVACUATOR SILICONE 100CC (DRAIN) ×2 IMPLANT
GAUZE SPONGE 4X4 12PLY STRL (GAUZE/BANDAGES/DRESSINGS) ×5 IMPLANT
GOWN STRL REUS W/ TWL LRG LVL3 (GOWN DISPOSABLE) ×1 IMPLANT
GOWN STRL REUS W/ TWL XL LVL3 (GOWN DISPOSABLE) ×1 IMPLANT
GOWN STRL REUS W/TWL LRG LVL3 (GOWN DISPOSABLE) ×3
GOWN STRL REUS W/TWL XL LVL3 (GOWN DISPOSABLE) ×3
ILLUMINATOR WAVEGUIDE N/F (MISCELLANEOUS) IMPLANT
KIT BASIN OR (CUSTOM PROCEDURE TRAY) ×3 IMPLANT
KIT MARKER MARGIN INK (KITS) ×3 IMPLANT
LIGHT WAVEGUIDE WIDE FLAT (MISCELLANEOUS) IMPLANT
NDL 18GX1X1/2 (RX/OR ONLY) (NEEDLE) IMPLANT
NDL FILTER BLUNT 18X1 1/2 (NEEDLE) IMPLANT
NDL HYPO 25GX1X1/2 BEV (NEEDLE) ×1 IMPLANT
NEEDLE 18GX1X1/2 (RX/OR ONLY) (NEEDLE) IMPLANT
NEEDLE FILTER BLUNT 18X 1/2SAF (NEEDLE)
NEEDLE FILTER BLUNT 18X1 1/2 (NEEDLE) IMPLANT
NEEDLE HYPO 25GX1X1/2 BEV (NEEDLE) ×3 IMPLANT
NS IRRIG 1000ML POUR BTL (IV SOLUTION) ×3 IMPLANT
PACK GENERAL/GYN (CUSTOM PROCEDURE TRAY) ×3 IMPLANT
PAD ABD 8X10 STRL (GAUZE/BANDAGES/DRESSINGS) ×3 IMPLANT
SUT MNCRL AB 4-0 PS2 18 (SUTURE) ×3 IMPLANT
SUT VIC AB 3-0 SH 8-18 (SUTURE) ×3 IMPLANT
SYR CONTROL 10ML LL (SYRINGE) ×3 IMPLANT
TOWEL GREEN STERILE (TOWEL DISPOSABLE) ×3 IMPLANT
TOWEL GREEN STERILE FF (TOWEL DISPOSABLE) ×3 IMPLANT

## 2020-01-12 NOTE — Anesthesia Postprocedure Evaluation (Signed)
Anesthesia Post Note  Patient: Cynthia Vasquez  Procedure(s) Performed: LEFT BREAST LUMPECTOMY WITH RADIOACTIVE SEED AND LEFT AXILLARY LYMPH NODE DISSECTION (Left Breast)     Patient location during evaluation: PACU Anesthesia Type: General Level of consciousness: awake and alert Pain management: pain level controlled Vital Signs Assessment: post-procedure vital signs reviewed and stable Respiratory status: spontaneous breathing, nonlabored ventilation, respiratory function stable and patient connected to nasal cannula oxygen Cardiovascular status: blood pressure returned to baseline and stable Postop Assessment: no apparent nausea or vomiting Anesthetic complications: no   No complications documented.  Last Vitals:  Vitals:   01/12/20 1000 01/12/20 1030  BP: 133/73 128/67  Pulse: 78 79  Resp: 13 12  Temp: (!) 36.3 C 36.5 C  SpO2: 100% 98%    Last Pain:  Vitals:   01/12/20 1100  TempSrc:   PainSc: [place]

## 2020-01-12 NOTE — Transfer of Care (Signed)
Immediate Anesthesia Transfer of Care Note  Patient: Cynthia Vasquez  Procedure(s) Performed: LEFT BREAST LUMPECTOMY WITH RADIOACTIVE SEED AND LEFT AXILLARY LYMPH NODE DISSECTION (Left Breast)  Patient Location: PACU  Anesthesia Type:General  Level of Consciousness: drowsy  Airway & Oxygen Therapy: Patient Spontanous Breathing and Patient connected to face mask oxygen  Post-op Assessment: Report given to RN and Post -op Vital signs reviewed and stable  Post vital signs: Reviewed and stable  Last Vitals:  Vitals Value Taken Time  BP 125/56 01/12/20 0942  Temp    Pulse 80 01/12/20 0944  Resp 14 01/12/20 0944  SpO2 99 % 01/12/20 0944  Vitals shown include unvalidated device data.  Last Pain:  Vitals:   01/12/20 0604  TempSrc: Oral  PainSc:          Complications: No complications documented.

## 2020-01-12 NOTE — Discharge Instructions (Addendum)
[facility] - DISCHARGE INSTRUCTIONS TO PATIENT  Activity:  Driving - May drive in 3 to 5 days, if doing well and off pain meds   Lifting - No lifting more than 15 pounds for one week, then no limit                       Practice your Covid-19 protection:  Wear a mask, social distance, and wash your hands frequently  Wound Care:   Leave the incision dry for 3 days, then you may shower  Diet:  As tolerated  Follow up appointment:  Call Dr. Pollie Friar office [facility]) at [phone] for an appointment in 7 to 14 days.  Medications and dosages:  Resume your home medications.  You have a prescription for:  Vicodin             You may also take Tylenol, ibuprofen, or Aleve for pain  Call Dr. Lucia Gaskins or his office  [phone]) if you have:  Temperature greater than 100.4,  Persistent nausea and vomiting,  Severe uncontrolled pain,  Redness, tenderness, or signs of infection (pain, swelling, redness, odor or green/yellow discharge around the site),  Difficulty breathing, headache or visual disturbances,  Any other questions or concerns you may have after discharge.  In an emergency, call 911 or go to an Emergency Department at a nearby hospital.

## 2020-01-12 NOTE — Interval H&P Note (Signed)
History and Physical Interval Note:  01/12/2020 7:22 AM  Cynthia Vasquez  has presented today for surgery, with the diagnosis of LEFT BREAST CANCER.  The various methods of treatment have been discussed with the patient and family.   Seed in place.  After consideration of risks, benefits and other options for treatment, the patient has consented to  Procedure(s) with comments: LEFT BREAST LUMPECTOMY WITH RADIOACTIVE SEED AND LEFT AXILLARY LYMPH NODE DISSECTION (Left) - PEC BLOCK as a surgical intervention.  The patient's history has been reviewed, patient examined, no change in status, stable for surgery.  I have reviewed the patient's chart and labs.  Questions were answered to the patient's satisfaction.     Shann Medal

## 2020-01-12 NOTE — Op Note (Signed)
01/12/2020  9:33 AM  PATIENT:  Cynthia Vasquez DOB: [birth year] MRN: [id number]  PREOP DIAGNOSIS:   LEFT BREAST CANCER  POSTOP DIAGNOSIS:    Left breast cancer, 3 o'clock position (T2, N1)  PROCEDURE:   Procedure(s):  LEFT BREAST LUMPECTOMY WITH RADIOACTIVE SEED AND LEFT AXILLARY LYMPH NODE DISSECTION  SURGEON:   Alphonsa Overall, M.D.  ANESTHESIA:   General  Anesthesiologist: Brennan Bailey, MD CRNA: Griffin Dakin, CRNA; Sammie Bench, CRNA  General  EBL:  75  ml  DRAINS:  none   LOCAL MEDICATIONS USED:   20 cc 1/4% marcaine with left pectoral block by anesthesia  SPECIMEN:   Left breast lumpectomy (6 color paint), left medial margin, left axillary contents  COUNTS CORRECT:  YES  INDICATIONS FOR PROCEDURE:  Cynthia Vasquez is a 74 y.o. (DOB: [birth year]) white female whose primary care physician is Sueanne Margarita, DO and comes for left breast lumpectomy and left axillary node dissection.   The options for breast cancer treatment have been discussed with the patient. She elected to proceed with lumpectomy and axillary sentinel lymph node.   She saw Drs. Magrinat and Moody at [facility].  She has a left breast cancer with 3 enlarged left axillary lymph nodes, one of which has biopsy proven metastatic cancer.    The indications and potential complications of surgery were explained to the patient. Potential complications include, but are not limited to, bleeding, infection, the need for further surgery, and nerve injury.     She had a I131 seed placed on 01/11/2020 in her left breast at [facility].  The seed is in the 3 o'clock position of the left breast.     OPERATIVE NOTE:   The patient was taken to operating room # 2 at [place] where she underwent a general anesthesia  supervised by Anesthesiologist: Brennan Bailey, MD CRNA: Griffin Dakin, CRNA; Sammie Bench, CRNA. Her left breast and axilla were prepped with   ChloraPrep and sterilely draped.    A time-out was held and the surgical check list was reviewed.    The cancer was about at the 3 o'clock position of the left breast.   It was 6 cm from the areola.  Because the cancer was just under the skin, I made an incision directly over the cancer.  I used the Neoprobe to identify the I131 seed.  I tried to excise an area around the tumor of at least 1 cm.    I excised this block of breast tissue approximately 4 cm by 5 cm  in diameter.   I painted the lumpectomy specimen with the 6 color paint kit and did a specimen mammogram which confirmed the mass, clip, and the seed were all in the right position in the specimen.  The specimen was sent to pathology who called back to confirm that they have the seed and the specimen.   The closest margin by specimen mammogram seemed to the be the medial margin, so I excised an extra medial margin and painted this.   I then started the left axillary node dissection.  I made a separate incision in the left axilla.  I carried this down to the axillary fat pad.  I took the dissection superiorly to the inferior edge of the axillary vein.  I divided one tributary from the axillary vein.  I went behind the pectoralis minor muscle for level 2 axillary lymph nodes.  I swept  the axillary contents inferiorly.  I identified the long thoracic nerve of Bell and the thoracodorsal tumor and spared these during the dissection.  She had at least 2 large palpable nodes within the specimen.  The specimen was sent to pathology.   I then irrigated the wounds with saline. I infiltrated approximately 20 mL of 1/4% Marcaine between the incisions. I placed 4 clips to mark biopsy cavity, at 12, 3, 6, and 9 o'clock.  I placed a 75 F Blake drain in the left axilla and sewed this in place with a 2-0 nylon suture.  I then closed all the wounds in layers using 3-0 Vicryl sutures for the deep layer. At the skin, I closed the incisions with a 4-0 Monocryl suture.  The incisions were then painted with Dermabond.  She had gauze placed over the wounds and her breast placed in a breast binder.   The patient tolerated the procedure well, was transported to the recovery room in good condition. Sponge and needle count were correct at the end of the case.   Final pathology is pending.   Alphonsa Overall, MD, [facility] Surgery Pager: [phone] Office phone:  [phone]

## 2020-01-12 NOTE — Anesthesia Procedure Notes (Signed)
Anesthesia Regional Block: Pectoralis block   Pre-Anesthetic Checklist: ,, timeout performed, Correct Patient, Correct Site, Correct Laterality, Correct Procedure, Correct Position, site marked, Risks and benefits discussed, pre-op evaluation,  At surgeon's request and post-op pain management  Laterality: Left  Prep: Maximum Sterile Barrier Precautions used, chloraprep       Needles:  Injection technique: Single-shot  Needle Type: Echogenic Stimulator Needle     Needle Length: 9cm  Needle Gauge: 22     Additional Needles:   Procedures:,,,, ultrasound used (permanent image in chart),,,,  Narrative:  Start time: 01/12/2020 7:14 AM End time: 01/12/2020 7:16 AM Injection made incrementally with aspirations every 5 mL.  Performed by: Personally  Anesthesiologist: Brennan Bailey, MD  Additional Notes: Risks, benefits, and alternative discussed. Patient gave consent for procedure. Patient prepped and draped in sterile fashion. Sedation administered, patient remains easily responsive to voice. Relevant anatomy identified with ultrasound guidance. Local anesthetic given in 5cc increments with no signs or symptoms of intravascular injection. No pain or paraesthesias with injection. Patient monitored throughout procedure with signs of LAST or immediate complications. Tolerated well. Ultrasound image placed in chart.  Tawny Asal, MD

## 2020-01-12 NOTE — Anesthesia Procedure Notes (Signed)
Procedure Name: LMA Insertion Date/Time: 01/12/2020 7:45 AM Performed by: Griffin Dakin, CRNA Pre-anesthesia Checklist: Patient identified, Emergency Drugs available, Suction available and Patient being monitored Patient Re-evaluated:Patient Re-evaluated prior to induction Oxygen Delivery Method: Circle system utilized Preoxygenation: Pre-oxygenation with 100% oxygen Induction Type: IV induction LMA: LMA inserted LMA Size: 4.0 Number of attempts: 1 Placement Confirmation: positive ETCO2 and breath sounds checked- equal and bilateral Tube secured with: Tape Dental Injury: Teeth and Oropharynx as per pre-operative assessment

## 2020-01-13 ENCOUNTER — Encounter (HOSPITAL_COMMUNITY): Payer: Self-pay | Admitting: Surgery

## 2020-01-15 LAB — SURGICAL PATHOLOGY

## 2020-01-17 ENCOUNTER — Other Ambulatory Visit: Payer: Self-pay | Admitting: Oncology

## 2020-01-17 DIAGNOSIS — C50412 Malignant neoplasm of upper-outer quadrant of left female breast: Secondary | ICD-10-CM

## 2020-01-17 NOTE — Progress Notes (Unsigned)
Cbc

## 2020-01-18 ENCOUNTER — Other Ambulatory Visit: Payer: Self-pay | Admitting: Oncology

## 2020-01-18 ENCOUNTER — Telehealth: Payer: Self-pay | Admitting: *Deleted

## 2020-01-18 DIAGNOSIS — Z170 Estrogen receptor positive status [ER+]: Secondary | ICD-10-CM

## 2020-01-18 NOTE — Progress Notes (Unsigned)
[facility]  Telephone:([phone]) [phone] Fax:(336) [phone]     ID: Cynthia Vasquez DOB: 10-14-1945  MR#: [id number]  QIH#:[id number]  Patient Care Team: Sueanne Margarita, DO as PCP - General (Internal Medicine) Rockwell Germany, RN as Oncology Nurse Navigator Mauro Kaufmann, RN as Oncology Nurse Navigator Cliffard Hair, Virgie Dad, MD as Consulting Physician (Oncology) Alphonsa Overall, MD as Consulting Physician (General Surgery) Kyung Rudd, MD as Consulting Physician (Radiation Oncology) Wilford Corner, MD as Consulting Physician (Gastroenterology) Chauncey Cruel, MD OTHER MD:  CHIEF COMPLAINT: Estrogen receptor positive breast cancer  CURRENT TREATMENT: Awaiting definitive surgery   HISTORY OF CURRENT ILLNESS: Cynthia Vasquez herself palpated a left breast mass, with associated dimpling, tenderness, and pinching pain. She underwent bilateral diagnostic mammography with tomography and left breast ultrasonography at [facility] on 11/09/2019 showing: breast density category C; 2.9 cm irregular mass in left breast at 2:30 corresponding to palpable abnormality; three enlarged left axillary lymph nodes.  Accordingly on 11/17/2019 she proceeded to biopsy of the left breast area in question. The pathology from this procedure ([id number]) showed: invasive mammary carcinoma, grade 2, e-cadherin positive. Prognostic indicators significant for: estrogen receptor, 95% positive and progesterone receptor, 95% positive, both with strong staining intensity. Proliferation marker Ki67 at 30%. HER2 negative by immunohistochemistry (1+).  The biopsied lymph node was positive for metastatic carcinoma.  The patient's subsequent history is as detailed below.   INTERVAL HISTORY: Cynthia Vasquez was evaluated in the multidisciplinary breast cancer clinic on 11/29/2019 accompanied by her husband Joe. Her case was also presented at the multidisciplinary breast cancer conference on the same day. At that time a  preliminary plan was proposed: Lumpectomy with axillary lymph node dissection; MammaPrint if no more than 3 axillary lymph nodes are involved; antiestrogens, adjuvant radiation.   REVIEW OF SYSTEMS: The patient denies unusual headaches, visual changes, nausea, vomiting, stiff neck, dizziness, or gait imbalance. There has been no cough, phlegm production, or pleurisy, no chest pain or pressure, and no change in bowel or bladder habits. The patient denies fever, rash, bleeding, unexplained fatigue or unexplained weight loss.  Jadeyn exercises by gardening.  A detailed review of systems was otherwise entirely negative.   PAST MEDICAL HISTORY: Past Medical History:  Diagnosis Date  . Atrial septal defect    repaired age 56  . GERD (gastroesophageal reflux disease)   . Glaucoma   . Headache    migraines until age 20- 13, have them 3-4 x year  . History of blood transfusion    with ASD surgery at age 74  . History of kidney stones    passed stones  . Hypertension   . Vitamin D deficiency     PAST SURGICAL HISTORY: Past Surgical History:  Procedure Laterality Date  . ASD REPAIR     age 81  . BREAST LUMPECTOMY WITH RADIOACTIVE SEED AND AXILLARY LYMPH NODE DISSECTION Left 01/12/2020   Procedure: LEFT BREAST LUMPECTOMY WITH RADIOACTIVE SEED AND LEFT AXILLARY LYMPH NODE DISSECTION;  Surgeon: Alphonsa Overall, MD;  Location: [facility];  Service: General;  Laterality: Left;  PEC BLOCK  . COLONOSCOPY    . UPPER GI ENDOSCOPY      FAMILY HISTORY: Family History  Problem Relation Age of Onset  . Dementia Mother   . Heart attack Father   . Hypertension Father   . Breast cancer Sister 13  The patient's father died at age 87 from a myocardial infarction.  The patient's mother died at 64 with Alzheimer's disease.  The  patient has 2 brothers, 2 sisters.  1 sister developed breast cancer at the age of 64.  The patient's daughter has a history of thyroid cancer.  She has not been genetically tested.  There  is no other history of cancer in the family to the patient's knowledge.   GYNECOLOGIC HISTORY:  No LMP recorded (lmp unknown). Patient is postmenopausal. Menarche: 74 years old Age at first live birth: 74 years old [place] P 3 LMP age 73 Contraceptive approximately 2 years, with no complications HRT no  Hysterectomy?  No BSO?  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  She is now retired.  Her husband [name] (goes by [employer]") is also a retired Optometrist.  Son [name] 8 lives in [place] and works for a company that processes [employer] for [employer], but he completed a masters in Chief Executive Officer and is looking for a different job; daughter [name] lives in [place] and is a Oncologist; son [name] lives in [place] and runs a business that puts and tennis on cell towers.  The patient has 8 grandchildren.  She attends our [employer].    ADVANCED DIRECTIVES: In the absence of any documents to the contrary the patient's husband is her healthcare power of attorney   HEALTH MAINTENANCE: Social History   Tobacco Use  . Smoking status: Never Smoker  . Smokeless tobacco: Never Used  Vaping Use  . Vaping Use: Never used  Substance Use Topics  . Alcohol use: No  . Drug use: No     Colonoscopy: "Less than 10 years ago"; [facility]  PAP: Remote  Bone density: Never   No Known Allergies  Current Outpatient Medications  Medication Sig Dispense Refill  . acetaminophen (TYLENOL) 500 MG tablet Take 500 mg by mouth every 6 (six) hours as needed for mild pain.     Marland Kitchen b complex vitamins tablet Take 1 tablet by mouth 3 (three) times a week.    . benzonatate (TESSALON) 100 MG capsule Take 1 capsule (100 mg total) by mouth 2 (two) times daily as needed for cough. (Patient not taking: Reported on 11/29/2019) 30 capsule 1  . cholecalciferol (VITAMIN D) 1000 UNITS tablet Take 1,000 Units by mouth daily.    .  hydrochlorothiazide (HYDRODIURIL) 25 MG tablet Take 25 mg by mouth daily.     Marland Kitchen HYDROcodone-acetaminophen (NORCO/VICODIN) 5-325 MG tablet Take 1 tablet by mouth every 6 (six) hours as needed for moderate pain. 15 tablet 0  . latanoprost (XALATAN) 0.005 % ophthalmic solution Place 1 drop into both eyes at bedtime.    Marland Kitchen losartan (COZAAR) 50 MG tablet Take 50 mg by mouth daily.     . Multiple Vitamins-Minerals (MULTIVITAMIN WITH MINERALS) tablet Take 1 tablet by mouth daily.    . pantoprazole (PROTONIX) 40 MG tablet Take 40 mg by mouth every other day.      No current facility-administered medications for this visit.    OBJECTIVE: White woman in no acute distress  There were no vitals filed for this visit.   There is no height or weight on file to calculate BMI.   Wt Readings from Last 3 Encounters:  01/12/20 161 lb 2.5 oz (73.1 kg)  01/10/20 161 lb 4 oz (73.1 kg)  11/29/19 161 lb 12.8 oz (73.4 kg)      ECOG FS:1 - Symptomatic but completely ambulatory  Ocular: Sclerae unicteric, pupils round and equal Ear-nose-throat: Wearing a mask  Lymphatic: No cervical or supraclavicular adenopathy Lungs no rales or rhonchi Heart regular rate and rhythm Abd soft, nontender, positive bowel sounds MSK no focal spinal tenderness, no joint edema Neuro: non-focal, well-oriented, appropriate affect Breasts: The right breast is unremarkable.  In the left breast upper outer quadrant there is a movable mass which measures perhaps 2 cm by palpation, with no skin or nipple changes of concern.  I do not palpate any axillary adenopathy.   LAB RESULTS:  CMP     Component Value Date/Time   NA 139 01/10/2020 0953   K 4.3 01/10/2020 0953   CL 103 01/10/2020 0953   CO2 25 01/10/2020 0953   GLUCOSE 99 01/10/2020 0953   BUN 30 (H) 01/10/2020 0953   CREATININE 1.03 (H) 01/10/2020 0953   CREATININE 1.06 (H) 11/29/2019 0845   CALCIUM 9.6 01/10/2020 0953   PROT 8.2 (H) 11/29/2019 0845   ALBUMIN 4.4 11/29/2019  0845   AST 23 11/29/2019 0845   ALT 17 11/29/2019 0845   ALKPHOS 48 11/29/2019 0845   BILITOT 0.6 11/29/2019 0845   GFRNONAA 54 (L) 01/10/2020 0953   GFRNONAA 52 (L) 11/29/2019 0845   GFRAA >60 01/10/2020 0953   GFRAA >60 11/29/2019 0845    No results found for: TOTALPROTELP, ALBUMINELP, A1GS, A2GS, BETS, BETA2SER, GAMS, MSPIKE, SPEI  Lab Results  Component Value Date   WBC 6.7 01/10/2020   NEUTROABS 3.9 11/29/2019   HGB 12.7 01/10/2020   HCT 39.0 01/10/2020   MCV 90.5 01/10/2020   PLT 315 01/10/2020    No results found for: LABCA2  No components found for: [id number]  No results for input(s): INR in the last 168 hours.  No results found for: LABCA2  No results found for: WUJ811  No results found for: BJY782  No results found for: NFA213  No results found for: CA2729  No components found for: HGQUANT  No results found for: CEA1 / No results found for: CEA1   No results found for: AFPTUMOR  No results found for: CHROMOGRNA  No results found for: KPAFRELGTCHN, LAMBDASER, KAPLAMBRATIO (kappa/lambda light chains)  No results found for: HGBA, HGBA2QUANT, HGBFQUANT, HGBSQUAN (Hemoglobinopathy evaluation)   Lab Results  Component Value Date   LDH 230 (H) 06/28/2019    No results found for: IRON, TIBC, IRONPCTSAT (Iron and TIBC)  Lab Results  Component Value Date   FERRITIN 626 (H) 07/03/2019    Urinalysis    Component Value Date/Time   COLORURINE YELLOW 06/11/2013 2007   APPEARANCEUR CLEAR 06/11/2013 2007   LABSPEC 1.017 06/11/2013 2007   PHURINE 6.5 06/11/2013 2007   GLUCOSEU NEGATIVE 06/11/2013 2007   HGBUR NEGATIVE 06/11/2013 2007   [place] NEGATIVE 06/11/2013 2007   [place] NEGATIVE 06/11/2013 2007   PROTEINUR NEGATIVE 06/11/2013 2007   UROBILINOGEN 0.2 06/11/2013 2007   NITRITE NEGATIVE 06/11/2013 2007   LEUKOCYTESUR NEGATIVE 06/11/2013 2007     STUDIES: MM Breast Surgical Specimen  Result Date: 01/12/2020 CLINICAL DATA:   Status post c/o low eyes LEFT lumpectomy. EXAM: SPECIMEN RADIOGRAPH OF THE LEFT BREAST COMPARISON:  Previous exam(s). FINDINGS: Status post excision of the left breast. The radioactive seed and ribbon shaped biopsy marker clip are present, completely intact, and were marked for pathology. IMPRESSION: Specimen radiograph of the left breast. Electronically Signed   By: Nolon Nations M.D.   On: 01/12/2020 08:24   MM LT RADIOACTIVE SEED LOC MAMMO GUIDE  Result Date: 01/11/2020 CLINICAL DATA:  74 year old with a biopsy-proven grade 2 invasive malignancy  in the [place] the LEFT breast with an adjacent satellite 1 cm mass and with at least 3 pathologic LEFT axillary lymph nodes, the largest demonstrating metastatic disease at biopsy. Radioactive seed localization is performed in anticipation of LEFT breast lumpectomy and LEFT axillary node dissection which is scheduled for tomorrow EXAM: MAMMOGRAPHIC GUIDED RADIOACTIVE SEED LOCALIZATION OF THE LEFT BREAST COMPARISON:  Previous exam(s). FINDINGS: Patient presents for radioactive seed localization prior to LEFT breast lumpectomy. I met with the patient and we discussed the procedure of seed localization including benefits and alternatives. We discussed the high likelihood of a successful procedure. We discussed the risks of the procedure including infection, bleeding, tissue injury and further surgery. We discussed the low dose of radioactivity involved in the procedure. Informed, written consent was given. The usual time-out protocol was performed immediately prior to the procedure. Using mammographic guidance, sterile technique with chlorhexidine as skin antisepsis, 1% lidocaine as local anesthesia, an I-125 radioactive seed was used to localize the mass and the associated ribbon shaped tissue marker clip in the UPPER OUTER LEFT breast using a lateral approach. The follow-up mammogram images confirm the seed is appropriately positioned within the  central portion of the mass. The images are marked for Dr. Lucia Gaskins. Follow-up survey of the patient confirms the presence of the radioactive seed. Order number of I-125 seed: [id number] Total activity: 0.246 mCi Reference Date: 01/04/2020 The patient tolerated the procedure well and was released from the [facility]. She was given instructions regarding seed removal. IMPRESSION: Radioactive seed localization of the biopsy-proven malignancy in the UPPER OUTER QUADRANT of the LEFT breast. No apparent complications. Electronically Signed   By: Evangeline Dakin M.D.   On: 01/11/2020 15:05     ELIGIBLE FOR AVAILABLE RESEARCH PROTOCOL: AET  ASSESSMENT: 74 y.o. [place] woman status post left breast upper outer quadrant biopsy 11/17/2019 for a clinical T2 N1, stage IIA invasive ductal carcinoma, grade 2, estrogen and progesterone receptor positive, HER-2 not amplified, with an MIB-1 of 30%.  (1) status post left lumpectomy and axillary lymph node dissection 01/12/2020 for a pT2 pN2 invasive ductal carcinoma, grade 3, with negative margins  (a) a total of 14 axillary lymph nodes removed, 8 positive, with extracapsular extension  (2) we will need adjuvant chemotherapy  (3) adjuvant radiation  (4) antiestrogens   PLAN: I called Cynthia Vasquez and let her know that she has more than 3+ lymph nodes and therefore does not qualify for MammaPrint she is going to need chemotherapy.  Most likely this will consist of doxorubicin and cyclophosphamide which may or may not be given in dose dense fashion followed by paclitaxel weekly.  She is meeting with Dr. Lucia Gaskins tomorrow and she will discuss port placement at that time.  I have written for her to have a CT of the chest and bone scan which I hope can be done next week.  She has an appointment with me the second week in July.  The first week in July unfortunately I will be out of town.  Virgie Dad. Shakeitha Umbaugh, MD 01/18/2020 11:13 AM Medical Oncology and Hematology [facility] [place], [place] [address] Tel. [phone]    Fax. [phone]   This document serves as a record of services personally performed by Lurline Del, MD. It was created on his behalf by Wilburn Mylar, a trained medical scribe. The creation of this record is based on the scribe's personal observations and the provider's statements to them.   I,  Lurline Del MD, have reviewed the above documentation for accuracy and completeness, and I agree with the above.    *Total Encounter Time as defined by the Centers for Medicare and Medicaid Services includes, in addition to the face-to-face time of a patient visit (documented in the note above) non-face-to-face time: obtaining and reviewing outside history, ordering and reviewing medications, tests or procedures, care coordination (communications with other health care professionals or caregivers) and documentation in the medical record.

## 2020-01-18 NOTE — Telephone Encounter (Signed)
VM received from pt stating she is returning call left by Dr Jana Hakim from yesterday.  This RN attempted to return call at 1015 am and obtained verified VM- informed pt call per MD was to discuss findings and further treatment plan.This RN's name and direct desk number given for return call.   Of note per MD pt is seeing Dr Lucia Gaskins today and he will likely review with her as well as need for port a cath for chemotherapy per surgical outcome ( 8+ lymph nodes ).

## 2020-01-19 ENCOUNTER — Other Ambulatory Visit: Payer: Self-pay | Admitting: Surgery

## 2020-01-19 ENCOUNTER — Telehealth: Payer: Self-pay | Admitting: Oncology

## 2020-01-19 ENCOUNTER — Inpatient Hospital Stay: Payer: Medicare HMO | Admitting: Oncology

## 2020-01-19 NOTE — Telephone Encounter (Signed)
Scheduled appt per 6/17 sch message - pt is aware of appts added.

## 2020-01-22 ENCOUNTER — Encounter: Payer: Self-pay | Admitting: *Deleted

## 2020-01-25 ENCOUNTER — Other Ambulatory Visit: Payer: Self-pay | Admitting: Oncology

## 2020-01-25 ENCOUNTER — Encounter: Payer: Self-pay | Admitting: *Deleted

## 2020-01-25 ENCOUNTER — Other Ambulatory Visit: Payer: Self-pay

## 2020-01-25 ENCOUNTER — Encounter (HOSPITAL_COMMUNITY)
Admission: RE | Admit: 2020-01-25 | Discharge: 2020-01-25 | Disposition: A | Discharge status: Home / Self Care | Payer: Medicare HMO | Source: Ambulatory Visit | Attending: Oncology | Admitting: Oncology

## 2020-01-25 DIAGNOSIS — C50919 Malignant neoplasm of unspecified site of unspecified female breast: Secondary | ICD-10-CM | POA: Diagnosis not present

## 2020-01-25 DIAGNOSIS — Z170 Estrogen receptor positive status [ER+]: Secondary | ICD-10-CM | POA: Diagnosis not present

## 2020-01-25 DIAGNOSIS — C50412 Malignant neoplasm of upper-outer quadrant of left female breast: Secondary | ICD-10-CM | POA: Insufficient documentation

## 2020-01-25 DIAGNOSIS — R911 Solitary pulmonary nodule: Secondary | ICD-10-CM | POA: Diagnosis not present

## 2020-01-25 MED ORDER — IOHEXOL 300 MG/ML  SOLN
75.0000 mL | Freq: Once | INTRAMUSCULAR | Status: AC | PRN
  Administered 2020-01-25: 75 mL via INTRAVENOUS

## 2020-01-25 MED ORDER — SODIUM CHLORIDE (PF) 0.9 % IJ SOLN
INTRAMUSCULAR | Status: AC
  Filled 2020-01-25: qty 50

## 2020-01-26 ENCOUNTER — Encounter (HOSPITAL_COMMUNITY): Payer: Medicare HMO

## 2020-01-26 ENCOUNTER — Other Ambulatory Visit: Payer: Self-pay

## 2020-01-26 ENCOUNTER — Encounter (HOSPITAL_BASED_OUTPATIENT_CLINIC_OR_DEPARTMENT_OTHER)
Admission: RE | Admit: 2020-01-26 | Discharge: 2020-01-26 | Disposition: A | Discharge status: Home / Self Care | Payer: Medicare HMO | Source: Ambulatory Visit | Attending: Plastic Surgery | Admitting: Plastic Surgery

## 2020-01-26 ENCOUNTER — Encounter (HOSPITAL_COMMUNITY)
Admission: RE | Admit: 2020-01-26 | Discharge: 2020-01-26 | Disposition: A | Discharge status: Home / Self Care | Payer: Medicare HMO | Source: Ambulatory Visit | Attending: Oncology | Admitting: Oncology

## 2020-01-26 ENCOUNTER — Encounter (HOSPITAL_BASED_OUTPATIENT_CLINIC_OR_DEPARTMENT_OTHER): Payer: Self-pay | Admitting: Surgery

## 2020-01-26 DIAGNOSIS — Z6832 Body mass index (BMI) 32.0-32.9, adult: Secondary | ICD-10-CM | POA: Diagnosis not present

## 2020-01-26 DIAGNOSIS — C50912 Malignant neoplasm of unspecified site of left female breast: Secondary | ICD-10-CM | POA: Diagnosis not present

## 2020-01-26 DIAGNOSIS — Z79899 Other long term (current) drug therapy: Secondary | ICD-10-CM | POA: Diagnosis not present

## 2020-01-26 DIAGNOSIS — E663 Overweight: Secondary | ICD-10-CM | POA: Diagnosis not present

## 2020-01-26 DIAGNOSIS — Z8616 Personal history of COVID-19: Secondary | ICD-10-CM | POA: Diagnosis not present

## 2020-01-26 DIAGNOSIS — K219 Gastro-esophageal reflux disease without esophagitis: Secondary | ICD-10-CM | POA: Diagnosis not present

## 2020-01-26 DIAGNOSIS — Z170 Estrogen receptor positive status [ER+]: Secondary | ICD-10-CM | POA: Diagnosis not present

## 2020-01-26 DIAGNOSIS — I10 Essential (primary) hypertension: Secondary | ICD-10-CM | POA: Diagnosis not present

## 2020-01-26 DIAGNOSIS — C50412 Malignant neoplasm of upper-outer quadrant of left female breast: Secondary | ICD-10-CM | POA: Insufficient documentation

## 2020-01-26 DIAGNOSIS — C773 Secondary and unspecified malignant neoplasm of axilla and upper limb lymph nodes: Secondary | ICD-10-CM | POA: Diagnosis not present

## 2020-01-26 LAB — BASIC METABOLIC PANEL
Anion gap: 9 (ref 5–15)
BUN: 22 mg/dL (ref 8–23)
CO2: 25 mmol/L (ref 22–32)
Calcium: 9.7 mg/dL (ref 8.9–10.3)
Chloride: 103 mmol/L (ref 98–111)
Creatinine, Ser: 1.02 mg/dL — ABNORMAL HIGH (ref 0.44–1.00)
GFR calc Af Amer: >60 mL/min (ref >60)
GFR calc non Af Amer: 55 mL/min — ABNORMAL LOW (ref >60)
Glucose, Bld: 103 mg/dL — ABNORMAL HIGH (ref 70–99)
Potassium: 4 mmol/L (ref 3.5–5.1)
Sodium: 137 mmol/L (ref 135–145)

## 2020-01-26 MED ORDER — TECHNETIUM TC 99M MEDRONATE IV KIT
21.1000 | PACK | Freq: Once | INTRAVENOUS | Status: AC | PRN
  Administered 2020-01-26: 21.1 via INTRAVENOUS

## 2020-01-26 MED ORDER — CHLORHEXIDINE GLUCONATE 4 % EX LIQD: 60.0000 mL | Freq: Once | CUTANEOUS | Status: DC

## 2020-01-29 ENCOUNTER — Other Ambulatory Visit (HOSPITAL_COMMUNITY): Payer: Medicare HMO

## 2020-01-29 ENCOUNTER — Encounter: Payer: Self-pay | Admitting: *Deleted

## 2020-01-30 ENCOUNTER — Other Ambulatory Visit: Payer: Self-pay | Admitting: Hematology and Oncology

## 2020-01-30 ENCOUNTER — Other Ambulatory Visit (HOSPITAL_COMMUNITY)
Admission: RE | Admit: 2020-01-30 | Discharge: 2020-01-30 | Disposition: A | Discharge status: Home / Self Care | Payer: Medicare HMO | Source: Ambulatory Visit | Attending: Surgery | Admitting: Surgery

## 2020-01-30 ENCOUNTER — Other Ambulatory Visit: Payer: Self-pay | Admitting: *Deleted

## 2020-01-30 DIAGNOSIS — Z01812 Encounter for preprocedural laboratory examination: Secondary | ICD-10-CM | POA: Insufficient documentation

## 2020-01-30 DIAGNOSIS — Z170 Estrogen receptor positive status [ER+]: Secondary | ICD-10-CM

## 2020-01-30 DIAGNOSIS — Z20822 Contact with and (suspected) exposure to covid-19: Secondary | ICD-10-CM | POA: Insufficient documentation

## 2020-01-30 LAB — SARS CORONAVIRUS 2 (TAT 6-24 HRS): SARS Coronavirus 2: NEGATIVE

## 2020-01-30 NOTE — Progress Notes (Signed)
echo

## 2020-01-31 ENCOUNTER — Telehealth: Payer: Self-pay

## 2020-01-31 ENCOUNTER — Encounter: Payer: Self-pay | Admitting: *Deleted

## 2020-01-31 NOTE — Telephone Encounter (Signed)
Attempted to call pt for below results. Pt did not answer. LVM to return call to [phone] and ask for Lindsey's nurse.

## 2020-01-31 NOTE — Telephone Encounter (Signed)
-----   Message from Gardenia Phlegm, NP sent at 01/29/2020  8:19 PM EDT ----- Cynthia Vasquez.  Can you please let patient know, great news, no bone cancer on scan.   ----- Message ----- From: Interface, Rad Results In Sent: 01/27/2020   8:16 PM EDT To: Chauncey Cruel, MD

## 2020-02-01 DIAGNOSIS — U071 COVID-19: Secondary | ICD-10-CM | POA: Diagnosis not present

## 2020-02-01 NOTE — H&P (Signed)
Cynthia Vasquez  Location: [facility] Patient #: [id number] DOB: [birth year] Married / Language: English / Race: White Female  History of Present Illness   The patient is a 74 year old female who presents with a complaint of breast cancer.  The PCP is Dr. Van Clines  The patient is at the [facility] - Oncology is Drs. Magrinat/Moody  She comes by herself.  She underwent a left breast lumpectomy with left axillary node dissection - 01/12/2020 - 2.8 cm IDC, grade 3, 8/14 nodes positive. CT of chest - 01/25/2020 - showed left axilla with 3.6 x 3.1 seroma, small lymph node adjacent to postop changes, 5 x 4 mm left lower lobe pulmonary nodule, mild pleural irregularity from prior right chest surgery. Bone scan on 01/26/2020 - negative Her left axillary drain put out 10 cc yesterday. So I removed her drain.  Plan: 1. Power port placement next Friday, 7/2. 2. Chemotherapy, 3. Follow up in 2 to 3 months  Past Medical History: 1. Left breast cancer Biopsy: 11/17/2019 - [id number] - IDC, ER - 95%, PR - 95%, Ki67 - 30%, Her2Neu - neg, lymph node - POSITIVE Left breast lumpectomy with left axillary node dissection - 01/12/2020 - 2.8 cm IDC, grade 3, 8/14 nodes positive 2. Hospitalized in Dec 2020 with Covid She is pretty much over this. But she said the one effect that she still has is trouble sleeping at night. 3. HTN 4. GERD 5. 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 6. Last colonoscopy by Dr. Michail Sermon around 7 to 8 years ago.  Social History: Husband Denice Paradise Children: [name] - 22 yo, in [place], [place] - 74 yo, Remo Lipps - 74 yo They used to live in [place], but moved several years ago.   Allergies (April Staton, CMA; 01/31/2020 9:27 AM) No Known Drug Allergies  [04/18/2018]:  Medication History (April Staton, CMA; 01/31/2020 9:27 AM) hydroCHLOROthiazide (25MG Tablet, Oral) Active. Losartan Potassium (50MG Tablet,  Oral) Active. Protonix (40MG Tablet DR, Oral) Active. NexIUM (10MG Packet, Oral) Active. Multiple Vitamin (1 (one) Oral) Active. Cholecalciferol (25 MCG(1000 UT) Tablet, Oral) Active. Medications Reconciled  Vitals (April Staton CMA; 01/31/2020 9:27 AM) 01/31/2020 9:27 AM Weight: 161.13 lb Height: 59.5in Body Surface Area: 1.69 m Body Mass Index: 32 kg/m  Temp.: 98.63F (Temporal)  Pulse: 77 (Regular)  P.OX: 93% (Room air) BP: 132/70(Sitting, Right Arm, Standard)    Physical Exam  General: Short WN overweight WF who is alert and generally healthy appearing. HEENT: Normal. Pupils equal.  Neck: Supple. No mass. No thyroid mass.  Lymph Nodes: No supraclavicular or cervical nodes. Left axillary incision - looks good. I can feel a 2 to 3 cm mass consistent with small seroma.  Breasts: Right - she has a scar under her right breast from her ASD repair, she has no right breast mass Left - UOQ scar looks good  Extremities: Good strength and ROM in upper and lower extremities.  Assessment & Plan  1.  BREAST CANCER, STAGE 2, LEFT (C50.912)  Story: Left breast biopsy - [id number] - IDC, ER - 95%, PR - 95%, Ki67 - 30%, Her2Neu - neg, lymph node - POSITIVE  Left breast lumpectomy with left axillary node dissection - 01/12/2020 - 2.8 cm IDC, grade 3, 8/14 nodes positive  Oncology - Magrinat/Moody  Plan:  1. Power port placement on 7/2 (Friday)  2. Follow up in 2 - 3 months  3. Chemotherapy  2.  HYPERTENSION (I10) 3. Hospitalized in Dec 2020 with Covid She is pretty  much over this. But she said the one effect that she still has is trouble sleeping at night. 4. GERD 5. 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   Alphonsa Overall, MD, [facility] Office phone:  [phone]

## 2020-02-02 ENCOUNTER — Encounter (HOSPITAL_BASED_OUTPATIENT_CLINIC_OR_DEPARTMENT_OTHER)
Admission: RE | Disposition: A | Discharge status: Home / Self Care | Payer: Self-pay | Source: Home / Self Care | Attending: Surgery

## 2020-02-02 ENCOUNTER — Telehealth: Payer: Self-pay

## 2020-02-02 ENCOUNTER — Ambulatory Visit (HOSPITAL_BASED_OUTPATIENT_CLINIC_OR_DEPARTMENT_OTHER)
Admission: RE | Admit: 2020-02-02 | Discharge: 2020-02-02 | Disposition: A | Discharge status: Home / Self Care | Payer: Medicare HMO | Attending: Surgery | Admitting: Surgery

## 2020-02-02 ENCOUNTER — Ambulatory Visit (HOSPITAL_BASED_OUTPATIENT_CLINIC_OR_DEPARTMENT_OTHER): Payer: Medicare HMO | Admitting: Anesthesiology

## 2020-02-02 ENCOUNTER — Ambulatory Visit (HOSPITAL_COMMUNITY): Payer: Medicare HMO

## 2020-02-02 ENCOUNTER — Other Ambulatory Visit: Payer: Self-pay

## 2020-02-02 ENCOUNTER — Encounter (HOSPITAL_BASED_OUTPATIENT_CLINIC_OR_DEPARTMENT_OTHER): Payer: Self-pay | Admitting: Surgery

## 2020-02-02 DIAGNOSIS — I10 Essential (primary) hypertension: Secondary | ICD-10-CM | POA: Diagnosis not present

## 2020-02-02 DIAGNOSIS — Z6832 Body mass index (BMI) 32.0-32.9, adult: Secondary | ICD-10-CM | POA: Insufficient documentation

## 2020-02-02 DIAGNOSIS — J9601 Acute respiratory failure with hypoxia: Secondary | ICD-10-CM | POA: Diagnosis not present

## 2020-02-02 DIAGNOSIS — E663 Overweight: Secondary | ICD-10-CM | POA: Diagnosis not present

## 2020-02-02 DIAGNOSIS — Z95828 Presence of other vascular implants and grafts: Secondary | ICD-10-CM

## 2020-02-02 DIAGNOSIS — Z170 Estrogen receptor positive status [ER+]: Secondary | ICD-10-CM | POA: Diagnosis not present

## 2020-02-02 DIAGNOSIS — C773 Secondary and unspecified malignant neoplasm of axilla and upper limb lymph nodes: Secondary | ICD-10-CM | POA: Insufficient documentation

## 2020-02-02 DIAGNOSIS — Z8616 Personal history of COVID-19: Secondary | ICD-10-CM | POA: Diagnosis not present

## 2020-02-02 DIAGNOSIS — Z79899 Other long term (current) drug therapy: Secondary | ICD-10-CM | POA: Diagnosis not present

## 2020-02-02 DIAGNOSIS — K219 Gastro-esophageal reflux disease without esophagitis: Secondary | ICD-10-CM | POA: Insufficient documentation

## 2020-02-02 DIAGNOSIS — C50412 Malignant neoplasm of upper-outer quadrant of left female breast: Secondary | ICD-10-CM | POA: Diagnosis not present

## 2020-02-02 DIAGNOSIS — U071 COVID-19: Secondary | ICD-10-CM | POA: Diagnosis not present

## 2020-02-02 DIAGNOSIS — C50912 Malignant neoplasm of unspecified site of left female breast: Secondary | ICD-10-CM | POA: Insufficient documentation

## 2020-02-02 DIAGNOSIS — Z419 Encounter for procedure for purposes other than remedying health state, unspecified: Secondary | ICD-10-CM

## 2020-02-02 DIAGNOSIS — Z452 Encounter for adjustment and management of vascular access device: Secondary | ICD-10-CM | POA: Diagnosis not present

## 2020-02-02 HISTORY — PX: PORTACATH PLACEMENT: SHX2246

## 2020-02-02 SURGERY — INSERTION, TUNNELED CENTRAL VENOUS DEVICE, WITH PORT
Anesthesia: General | Site: Chest | Laterality: Right

## 2020-02-02 MED ORDER — FENTANYL CITRATE (PF) 100 MCG/2ML IJ SOLN: 25.0000 ug | INTRAMUSCULAR | Status: DC | PRN

## 2020-02-02 MED ORDER — PROPOFOL 10 MG/ML IV BOLUS
INTRAVENOUS | Status: AC
  Filled 2020-02-02: qty 20

## 2020-02-02 MED ORDER — KETOROLAC TROMETHAMINE 30 MG/ML IJ SOLN
15.0000 mg | Freq: Once | INTRAMUSCULAR | Status: AC | PRN
  Administered 2020-02-02: 15 mg via INTRAVENOUS

## 2020-02-02 MED ORDER — HEPARIN (PORCINE) IN NACL 2-0.9 UNITS/ML
INTRAMUSCULAR | Status: AC | PRN
  Administered 2020-02-02: 1 via INTRAVENOUS

## 2020-02-02 MED ORDER — LIDOCAINE 2% (20 MG/ML) 5 ML SYRINGE
INTRAMUSCULAR | Status: AC
  Filled 2020-02-02: qty 5

## 2020-02-02 MED ORDER — FENTANYL CITRATE (PF) 100 MCG/2ML IJ SOLN
INTRAMUSCULAR | Status: DC | PRN
  Administered 2020-02-02: 25 ug via INTRAVENOUS
  Administered 2020-02-02: 50 ug via INTRAVENOUS
  Administered 2020-02-02: 25 ug via INTRAVENOUS
  Administered 2020-02-02 (×2): 50 ug via INTRAVENOUS

## 2020-02-02 MED ORDER — ONDANSETRON HCL 4 MG/2ML IJ SOLN
INTRAMUSCULAR | Status: DC | PRN
  Administered 2020-02-02: 4 mg via INTRAVENOUS

## 2020-02-02 MED ORDER — ACETAMINOPHEN 500 MG PO TABS
1000.0000 mg | ORAL_TABLET | ORAL | Status: AC
  Administered 2020-02-02: 1000 mg via ORAL

## 2020-02-02 MED ORDER — CEFAZOLIN SODIUM-DEXTROSE 2-4 GM/100ML-% IV SOLN
2.0000 g | INTRAVENOUS | Status: AC
  Administered 2020-02-02: 2 g via INTRAVENOUS

## 2020-02-02 MED ORDER — HEPARIN SOD (PORK) LOCK FLUSH 100 UNIT/ML IV SOLN
INTRAVENOUS | Status: DC | PRN
  Administered 2020-02-02: 400 [IU]

## 2020-02-02 MED ORDER — EPHEDRINE SULFATE-NACL 50-0.9 MG/10ML-% IV SOSY
PREFILLED_SYRINGE | INTRAVENOUS | Status: DC | PRN
  Administered 2020-02-02 (×2): 10 mg via INTRAVENOUS

## 2020-02-02 MED ORDER — HEPARIN (PORCINE) IN NACL 1000-0.9 UT/500ML-% IV SOLN
INTRAVENOUS | Status: AC
  Filled 2020-02-02: qty 500

## 2020-02-02 MED ORDER — FENTANYL CITRATE (PF) 100 MCG/2ML IJ SOLN
INTRAMUSCULAR | Status: AC
  Filled 2020-02-02: qty 2

## 2020-02-02 MED ORDER — HEPARIN SOD (PORK) LOCK FLUSH 100 UNIT/ML IV SOLN
INTRAVENOUS | Status: AC
  Filled 2020-02-02: qty 5

## 2020-02-02 MED ORDER — CEFAZOLIN SODIUM-DEXTROSE 2-4 GM/100ML-% IV SOLN
INTRAVENOUS | Status: AC
  Filled 2020-02-02: qty 100

## 2020-02-02 MED ORDER — BUPIVACAINE HCL (PF) 0.25 % IJ SOLN
INTRAMUSCULAR | Status: DC | PRN
  Administered 2020-02-02: 10 mL

## 2020-02-02 MED ORDER — ACETAMINOPHEN 500 MG PO TABS
ORAL_TABLET | ORAL | Status: AC
  Filled 2020-02-02: qty 2

## 2020-02-02 MED ORDER — BUPIVACAINE HCL (PF) 0.25 % IJ SOLN
INTRAMUSCULAR | Status: AC
  Filled 2020-02-02: qty 30

## 2020-02-02 MED ORDER — PROPOFOL 10 MG/ML IV BOLUS
INTRAVENOUS | Status: DC | PRN
  Administered 2020-02-02: 200 mg via INTRAVENOUS

## 2020-02-02 MED ORDER — DEXAMETHASONE SODIUM PHOSPHATE 10 MG/ML IJ SOLN
INTRAMUSCULAR | Status: AC
  Filled 2020-02-02: qty 1

## 2020-02-02 MED ORDER — LACTATED RINGERS IV SOLN: INTRAVENOUS | Status: DC

## 2020-02-02 MED ORDER — ONDANSETRON HCL 4 MG/2ML IJ SOLN
INTRAMUSCULAR | Status: AC
  Filled 2020-02-02: qty 2

## 2020-02-02 MED ORDER — LIDOCAINE 2% (20 MG/ML) 5 ML SYRINGE
INTRAMUSCULAR | Status: DC | PRN
  Administered 2020-02-02: 100 mg via INTRAVENOUS

## 2020-02-02 MED ORDER — KETOROLAC TROMETHAMINE 30 MG/ML IJ SOLN
INTRAMUSCULAR | Status: AC
  Filled 2020-02-02: qty 1

## 2020-02-02 MED ORDER — DEXAMETHASONE SODIUM PHOSPHATE 10 MG/ML IJ SOLN
INTRAMUSCULAR | Status: DC | PRN
  Administered 2020-02-02: 10 mg via INTRAVENOUS

## 2020-02-02 SURGICAL SUPPLY — 55 items
ADH SKN CLS APL DERMABOND .7 (GAUZE/BANDAGES/DRESSINGS) ×1
APL PRP STRL LF DISP 70% ISPRP (MISCELLANEOUS) ×1
APL SKNCLS STERI-STRIP NONHPOA (GAUZE/BANDAGES/DRESSINGS) ×1
BAG DECANTER FOR FLEXI CONT (MISCELLANEOUS) ×3 IMPLANT
BENZOIN TINCTURE PRP APPL 2/3 (GAUZE/BANDAGES/DRESSINGS) ×3 IMPLANT
BLADE SURG 15 STRL LF DISP TIS (BLADE) ×1 IMPLANT
BLADE SURG 15 STRL SS (BLADE) ×3
CHLORAPREP W/TINT 26 (MISCELLANEOUS) ×3 IMPLANT
CLEANER CAUTERY TIP 5X5 PAD (MISCELLANEOUS) ×1 IMPLANT
CLOSURE WOUND 1/4X4 (GAUZE/BANDAGES/DRESSINGS) ×1
COVER BACK TABLE 60X90IN (DRAPES) ×3 IMPLANT
COVER MAYO STAND STRL (DRAPES) ×3 IMPLANT
COVER PROBE 5X48 (MISCELLANEOUS) ×3
COVER WAND RF STERILE (DRAPES) IMPLANT
DECANTER SPIKE VIAL GLASS SM (MISCELLANEOUS) IMPLANT
DERMABOND ADVANCED (GAUZE/BANDAGES/DRESSINGS) ×2
DERMABOND ADVANCED .7 DNX12 (GAUZE/BANDAGES/DRESSINGS) ×1 IMPLANT
DRAPE C-ARM 42X72 X-RAY (DRAPES) ×3 IMPLANT
DRAPE LAPAROSCOPIC ABDOMINAL (DRAPES) ×3 IMPLANT
DRAPE UTILITY XL STRL (DRAPES) ×3 IMPLANT
ELECT REM PT RETURN 9FT ADLT (ELECTROSURGICAL) ×3
ELECTRODE REM PT RTRN 9FT ADLT (ELECTROSURGICAL) ×1 IMPLANT
GAUZE SPONGE 4X4 12PLY STRL (GAUZE/BANDAGES/DRESSINGS) IMPLANT
GAUZE SPONGE 4X4 12PLY STRL LF (GAUZE/BANDAGES/DRESSINGS) ×6 IMPLANT
GLOVE BIOGEL PI IND STRL 7.0 (GLOVE) IMPLANT
GLOVE BIOGEL PI INDICATOR 7.0 (GLOVE) ×4
GLOVE SURG SS PI 7.0 STRL IVOR (GLOVE) ×2 IMPLANT
GLOVE SURG SYN 7.5  E (GLOVE) ×3
GLOVE SURG SYN 7.5 E (GLOVE) ×1 IMPLANT
GOWN STRL REUS W/ TWL LRG LVL3 (GOWN DISPOSABLE) ×1 IMPLANT
GOWN STRL REUS W/ TWL XL LVL3 (GOWN DISPOSABLE) ×1 IMPLANT
GOWN STRL REUS W/TWL LRG LVL3 (GOWN DISPOSABLE) ×3
GOWN STRL REUS W/TWL XL LVL3 (GOWN DISPOSABLE) ×3
IV CATH AUTO 14GX1.75 SAFE ORG (IV SOLUTION) IMPLANT
IV CATH PLACEMENT UNIT 16 GA (IV SOLUTION) IMPLANT
IV CONNECTOR ONE LINK NDLESS (IV SETS) ×3 IMPLANT
IV KIT MINILOC 20X1 SAFETY (NEEDLE) IMPLANT
KIT CVR 48X5XPRB PLUP LF (MISCELLANEOUS) IMPLANT
KIT PORT POWER 8FR ISP CVUE (Port) ×2 IMPLANT
NDL HYPO 25X1 1.5 SAFETY (NEEDLE) ×1 IMPLANT
NEEDLE HYPO 25X1 1.5 SAFETY (NEEDLE) ×3 IMPLANT
PACK BASIN DAY SURGERY FS (CUSTOM PROCEDURE TRAY) ×3 IMPLANT
PAD CLEANER CAUTERY TIP 5X5 (MISCELLANEOUS) ×2
PENCIL SMOKE EVACUATOR (MISCELLANEOUS) ×3 IMPLANT
SET SHEATH INTRODUCER 10FR (MISCELLANEOUS) IMPLANT
SHEATH COOK PEEL AWAY SET 9F (SHEATH) IMPLANT
SLEEVE SCD COMPRESS KNEE MED (MISCELLANEOUS) ×3 IMPLANT
STRIP CLOSURE SKIN 1/4X4 (GAUZE/BANDAGES/DRESSINGS) ×2 IMPLANT
SUT ETHILON 2 0 FS 18 (SUTURE) IMPLANT
SUT ETHILON 3 0 PS 1 (SUTURE) IMPLANT
SUT MNCRL AB 4-0 PS2 18 (SUTURE) ×3 IMPLANT
SUT VICRYL 3-0 CR8 SH (SUTURE) ×3 IMPLANT
SYR 5ML LUER SLIP (SYRINGE) ×3 IMPLANT
SYR CONTROL 10ML LL (SYRINGE) ×3 IMPLANT
TOWEL GREEN STERILE FF (TOWEL DISPOSABLE) ×3 IMPLANT

## 2020-02-02 NOTE — Discharge Instructions (Signed)
[facility] - DISCHARGE INSTRUCTIONS TO PATIENT  Activity:  Driving - May drive in a day or two, if doing well                       Practice your Covid-19 protection:  Wear a mask, social distance, and wash your hands frequently  Wound Care:   Leave the incision dry for 2 days, then you may shower  Diet:  As tolerated  Follow up appointment:  Call Dr. Pollie Friar office [facility]) at [phone] for an appointment in 2 months..  Medications and dosages:  Resume your home medications.             You may also take Tylenol, ibuprofen, or Aleve for pain  Call Dr. Lucia Gaskins or his office  [phone]) if you have:  Temperature greater than 100.4,  Persistent nausea and vomiting,  Severe uncontrolled pain,  Redness, tenderness, or signs of infection (pain, swelling, redness, odor or green/yellow discharge around the site),  Difficulty breathing, headache or visual disturbances,  Any other questions or concerns you may have after discharge.  In an emergency, call 911 or go to an Emergency Department at a nearby hospital.    Post Anesthesia Home Care Instructions  Activity: Get plenty of rest for the remainder of the day. A responsible individual must stay with you for 24 hours following the procedure.  For the next 24 hours, DO NOT: -Drive a car -Paediatric nurse -Drink alcoholic beverages -Take any medication unless instructed by your physician -Make any legal decisions or sign important papers.  Meals: Start with liquid foods such as gelatin or soup. Progress to regular foods as tolerated. Avoid greasy, spicy, heavy foods. If nausea and/or vomiting occur, drink only clear liquids until the nausea and/or vomiting subsides. Call your physician if vomiting continues.  Special Instructions/Symptoms: Your throat may feel dry or sore from the anesthesia or the breathing tube placed in your throat during surgery. If this causes discomfort, gargle with  warm salt water. The discomfort should disappear within 24 hours.  If you had a scopolamine patch placed behind your ear for the management of post- operative nausea and/or vomiting:  1. The medication in the patch is effective for 72 hours, after which it should be removed.  Wrap patch in a tissue and discard in the trash. Wash hands thoroughly with soap and water. 2. You may remove the patch earlier than 72 hours if you experience unpleasant side effects which may include dry mouth, dizziness or visual disturbances. 3. Avoid touching the patch. Wash your hands with soap and water after contact with the patch.

## 2020-02-02 NOTE — Anesthesia Procedure Notes (Signed)
Procedure Name: LMA Insertion Date/Time: 02/02/2020 1:38 PM Performed by: Genelle Bal, CRNA Pre-anesthesia Checklist: Patient identified, Emergency Drugs available, Suction available and Patient being monitored Patient Re-evaluated:Patient Re-evaluated prior to induction Oxygen Delivery Method: Circle system utilized Preoxygenation: Pre-oxygenation with 100% oxygen Induction Type: IV induction Ventilation: Mask ventilation without difficulty LMA: LMA inserted LMA Size: 4.0 Number of attempts: 1 Airway Equipment and Method: Bite block Placement Confirmation: positive ETCO2 Tube secured with: Tape Dental Injury: Teeth and Oropharynx as per pre-operative assessment

## 2020-02-02 NOTE — Anesthesia Postprocedure Evaluation (Signed)
Anesthesia Post Note  Patient: Cynthia Vasquez  Procedure(s) Performed: INSERTION PORT-A-CATH WITH ULTRASOUND GUIDANCE (Right Chest)     Patient location during evaluation: PACU Anesthesia Type: General Level of consciousness: awake and alert Pain management: pain level controlled Vital Signs Assessment: post-procedure vital signs reviewed and stable Respiratory status: spontaneous breathing, nonlabored ventilation, respiratory function stable and patient connected to nasal cannula oxygen Cardiovascular status: blood pressure returned to baseline and stable Postop Assessment: no apparent nausea or vomiting Anesthetic complications: no   No complications documented.  Last Vitals:  Vitals:   02/02/20 1512 02/02/20 1513  BP:    Pulse: 90 87  Resp: 16 12  Temp:    SpO2: 97% 97%    Last Pain:  Vitals:   02/02/20 1515  TempSrc:   PainSc: 0-No pain                 Erikka Follmer S

## 2020-02-02 NOTE — Op Note (Addendum)
02/02/2020  2:34 PM  PATIENT:  Cynthia Vasquez, 74 y.o., female MRN: [id number] DOB: 01/10/1946  PREOP DIAGNOSIS:  LEFT BREAST CANCER, anticipate chemotherapy  POSTOP DIAGNOSIS:   Left breast cancer, anticipate chemotherapy  PROCEDURE:   Procedure(s):  Right internal jugular INSERTION PORT-A-CATH WITH ULTRASOUND GUIDANCE  SURGEON:   Alphonsa Overall, M.D.  ANESTHESIA:   general  Anesthesiologist: Myrtie Soman, MD CRNA: Genelle Bal, CRNA  General  EBL:  minimal  ml  COUNTS CORRECT:  YES  INDICATIONS FOR PROCEDURE:  Cynthia Vasquez is a 74 y.o. (DOB: 1945/09/25) white female whose primary care physician is Sueanne Margarita, DO and comes for power port placement for the treatment of left cancer.  Dr. Jana Hakim is her treating oncologist.   The indications and risks of the surgery were explained to the patient.  The risks include, but are not limited to, infection, bleeding, pneumothorax, nerve injury, and thrombosis of the vein.  OPERATIVE NOTE:  The patient was taken to OR room #[facility] at [facility].  Anesthesia was provided by Anesthesiologist: Myrtie Soman, MD CRNA: Genelle Bal, CRNA.  At the beginning of the operation, the patient was given 2 gm Ancef, had a roll placed under her back, and had the upper chest/neck prepped with Chloroprep and draped.   A time out was held and the surgery checklist reviewed.   The patient was placed in Trendelenburg position.  The right internal jugular vein was accessed with a 16 gauge needle and a guide wire threaded through the needle into the vein.  The position of the wire was checked with fluoroscopy.   I then developed a pocket in the upper inner aspect of the right chest for the port reservoir.  I used the [employer] for venous access.  The reservoir was sewn in place with a 3-0 Vicryl suture.  The reservoir had been flushed with dilute (10 units/cc) heparin.   I then passed the silastic tubing from the  reservoir incision to the incision over the clavicle to the right neck incision.  I used the 8 [place] introducer to pass the tubing into the vein.  The tip of the silastic catheter was position at the junction of the SVC and the right atrium under fluoroscopy.  The silastic catheter was then attached to the port with the bayonet device.    At the neck incision, the tubing went almost directly in, which left a bump there.  But the tubing was not kinked and fluid flowed through the tube.   The entire port and tubing were checked with fluoroscopy and then the port was flushed with 4 cc of concentrated heparin (100 units/cc).  I injected 15 cc of 1/4% marcaine in the wounds.   The wounds were then closed with 3-0 vicryl subcutaneous sutures and the skin closed with a 4-0 Monocryl suture.  The skin was painted with DermaBond.   The patient was transferred to the recovery room in good condition.  The sponge and needle count were correct at the end of the case.  A CXR is ordered for port placement and pending at the time of this note.  Alphonsa Overall, MD, [facility] Surgery Office phone:  [phone]

## 2020-02-02 NOTE — Transfer of Care (Signed)
Immediate Anesthesia Transfer of Care Note  Patient: Cynthia Vasquez  Procedure(s) Performed: INSERTION PORT-A-CATH WITH ULTRASOUND GUIDANCE (Right Chest)  Patient Location: PACU  Anesthesia Type:General  Level of Consciousness: awake, alert  and oriented  Airway & Oxygen Therapy: Patient Spontanous Breathing and Patient connected to face mask oxygen  Post-op Assessment: Report given to RN and Post -op Vital signs reviewed and stable  Post vital signs: Reviewed and stable  Last Vitals:  Vitals Value Taken Time  BP 130/61 02/02/20 1500  Temp 36.4 C 02/02/20 1437  Pulse 89 02/02/20 1510  Resp 11 02/02/20 1510  SpO2 99 % 02/02/20 1510  Vitals shown include unvalidated device data.  Last Pain:  Vitals:   02/02/20 1445  TempSrc:   PainSc: Asleep         Complications: No complications documented.

## 2020-02-02 NOTE — Anesthesia Preprocedure Evaluation (Signed)
Anesthesia Evaluation  Patient identified by MRN, date of birth, ID band Patient awake    Reviewed: Allergy & Precautions, NPO status , Patient's Chart, lab work & pertinent test results  Airway Mallampati: II  TM Distance: >3 FB Neck ROM: Full    Dental no notable dental hx.    Pulmonary neg pulmonary ROS,    Pulmonary exam normal breath sounds clear to auscultation       Cardiovascular hypertension, Pt. on medications Normal cardiovascular exam Rhythm:Regular Rate:Normal     Neuro/Psych negative neurological ROS  negative psych ROS   GI/Hepatic Neg liver ROS, GERD  ,  Endo/Other  negative endocrine ROS  Renal/GU negative Renal ROS  negative genitourinary   Musculoskeletal negative musculoskeletal ROS (+)   Abdominal   Peds negative pediatric ROS (+)  Hematology negative hematology ROS (+)   Anesthesia Other Findings   Reproductive/Obstetrics negative OB ROS                             Anesthesia Physical Anesthesia Plan  ASA: II  Anesthesia Plan: General   Post-op Pain Management:    Induction: Intravenous  PONV Risk Score and Plan: 3 and Ondansetron, Dexamethasone and Treatment may vary due to age or medical condition  Airway Management Planned: LMA  Additional Equipment:   Intra-op Plan:   Post-operative Plan: Extubation in OR  Informed Consent: I have reviewed the patients History and Physical, chart, labs and discussed the procedure including the risks, benefits and alternatives for the proposed anesthesia with the patient or authorized representative who has indicated his/her understanding and acceptance.     Dental advisory given  Plan Discussed with: CRNA and Surgeon  Anesthesia Plan Comments:         Anesthesia Quick Evaluation

## 2020-02-02 NOTE — Telephone Encounter (Signed)
Called made husband aware of bone scan results as patient was in pre-op unable to take calls family is appreciative of call no concerns or questions voiced

## 2020-02-02 NOTE — Interval H&P Note (Signed)
History and Physical Interval Note:  02/02/2020 1:21 PM  Cynthia Vasquez  has presented today for surgery, with the diagnosis of LEFT BREAST CANCER.  The various methods of treatment have been discussed with the patient and family.   Her husband is here with her.  After consideration of risks, benefits and other options for treatment, the patient has consented to  Procedure(s): INSERTION PORT-A-CATH WITH ULTRASOUND GUIDANCE (N/A) as a surgical intervention.  The patient's history has been reviewed, patient examined, no change in status, stable for surgery.  I have reviewed the patient's chart and labs.  Questions were answered to the patient's satisfaction.     Shann Medal

## 2020-02-06 ENCOUNTER — Encounter (HOSPITAL_BASED_OUTPATIENT_CLINIC_OR_DEPARTMENT_OTHER): Payer: Self-pay | Admitting: Surgery

## 2020-02-06 ENCOUNTER — Other Ambulatory Visit: Payer: Self-pay | Admitting: Oncology

## 2020-02-06 DIAGNOSIS — Z170 Estrogen receptor positive status [ER+]: Secondary | ICD-10-CM

## 2020-02-06 NOTE — Progress Notes (Signed)
START ON PATHWAY REGIMEN - Breast     Cycles 1 through 4: A cycle is every 14 days:     Doxorubicin      Cyclophosphamide      Pegfilgrastim-xxxx    Cycles 5 through 16: A cycle is every 7 days:     Paclitaxel   **Always confirm dose/schedule in your pharmacy ordering system**  Patient Characteristics: Postoperative without Neoadjuvant Therapy (Pathologic Staging), Invasive Disease, Adjuvant Therapy, HER2 Negative/Unknown/Equivocal, ER Positive, Node Positive, Node Positive (4+) Therapeutic Status: Postoperative without Neoadjuvant Therapy (Pathologic Staging) AJCC Grade: GX AJCC N Category: pNX AJCC M Category: cM0 ER Status: Positive (+) AJCC 8 Stage Grouping: IIB HER2 Status: Negative (-) Oncotype Dx Recurrence Score: Not Appropriate AJCC T Category: pTX PR Status: Positive (+) Intent of Therapy: Curative Intent, Discussed with Patient

## 2020-02-07 NOTE — Progress Notes (Signed)
Cynthia Vasquez  [phone] [phone]     ID: Cynthia Vasquez DOB: July 17, 1946  MR#: [id number]  TOI#:[id number]  Patient Care Team: Sueanne Margarita, DO as PCP - General (Internal Medicine) Rockwell Germany, RN as Oncology Nurse Navigator Mauro Kaufmann, RN as Oncology Nurse Navigator Braylon Lemmons, Virgie Dad, MD as Consulting Physician (Oncology) Alphonsa Overall, MD as Consulting Physician (General Surgery) Kyung Rudd, MD as Consulting Physician (Radiation Oncology) Wilford Corner, MD as Consulting Physician (Gastroenterology) Chauncey Cruel, MD OTHER MD:  CHIEF COMPLAINT: Estrogen receptor positive breast cancer  CURRENT TREATMENT: to begin adjuvant chemotherapy 02/13/2020   INTERVAL HISTORY: Cynthia Vasquez returns today for follow up of her estrogen receptor breast cancer.  Since her last visit, she underwent staging chest CT on 01/25/2020 showing: postoperative changes in left axilla with area of fluid, likely postoperative seroma; small lymph node adjacent to postoperative changes suspected, less than a cm; nonspecific 5 mm left lower lobe pulmonary nodule; findings along right chest presumably associated with prior thoracotomy in this patient with history of prior ASD repair.  She also underwent bone scan on 01/26/2020 showing no evidence of bony metastatic disease.  Finally, she underwent port placement on 02/02/2020.  She is scheduled for echocardiogram and teaching nurse later today and to start her chemotherapy 02/13/2020.   REVIEW OF SYSTEMS: Cynthia Vasquez has been active with her 35-year-old grandchild and also a 75-monthold grandchild.  She underwent surgery and port placement without major problems although she does feel rather tight in the left upper extremity and feels like she has a "ball" in the left armpit.  She has been having mild headaches.  She is very emotional and anxious at this point.  Very worried about what is coming.  This is understandable of  course.  A detailed review of systems today was otherwise stable.   HISTORY OF CURRENT ILLNESS: From the original intake note:  CKinzlyherself palpated a left breast mass, with associated dimpling, tenderness, and pinching pain. She underwent bilateral diagnostic mammography with tomography and left breast [facility] showing: breast density category C; 2.9 cm irregular mass in left breast at 2:30 corresponding to palpable abnormality; three enlarged left axillary lymph nodes.  Accordingly on 11/17/2019 she proceeded to biopsy of the left breast area in question. The pathology from this procedure ([id number]) showed: invasive mammary carcinoma, grade 2, e-cadherin positive. Prognostic indicators significant for: estrogen receptor, 95% positive and progesterone receptor, 95% positive, both with strong staining intensity. Proliferation marker Ki67 at 30%. HER2 negative by immunohistochemistry (1+).  The biopsied lymph node was positive for metastatic carcinoma.  The patient's subsequent history is as detailed below.   PAST MEDICAL HISTORY: Past Medical History:  Diagnosis Date  . Atrial septal defect    repaired age 74 . [facility]   IDC left breast  . GERD (gastroesophageal reflux disease)   . Glaucoma   . Headache    migraines until age 74- 67 have them 3-4 x year  . History of blood transfusion    with ASD surgery at age 74 . History of kidney stones    passed stones  . Hypertension   . Vitamin D deficiency     PAST SURGICAL HISTORY: Past Surgical History:  Procedure Laterality Date  . ASD REPAIR     age 74 . BREAST LUMPECTOMY WITH RADIOACTIVE SEED AND AXILLARY LYMPH NODE DISSECTION Left 01/12/2020   Procedure: LEFT BREAST LUMPECTOMY WITH RADIOACTIVE SEED  AND LEFT AXILLARY LYMPH NODE DISSECTION;  Surgeon: Alphonsa Overall, MD;  Location: [facility];  Service: General;  Laterality: Left;  PEC BLOCK  . COLONOSCOPY    . PORTACATH  PLACEMENT Right 02/02/2020   Procedure: INSERTION PORT-A-CATH WITH ULTRASOUND GUIDANCE;  Surgeon: Alphonsa Overall, MD;  Location: [facility];  Service: General;  Laterality: Right;  . UPPER GI ENDOSCOPY      FAMILY HISTORY: Family History  Problem Relation Age of Onset  . Dementia Mother   . Heart attack Father   . Hypertension Father   . Breast cancer Sister 60  The patient's father died at age 72 from a myocardial infarction.  The patient's mother died at 32 with Alzheimer's disease.  The patient has 2 brothers, 2 sisters.  1 sister developed breast cancer at the age of 72.  The patient's daughter has a history of thyroid cancer.  She has not been genetically tested.  There is no other history of cancer in the family to the patient's knowledge.   GYNECOLOGIC HISTORY:  No LMP recorded (lmp unknown). Patient is postmenopausal. Menarche: 74 years old Age at first live birth: 74 years old [place] P 3 LMP age 5 Contraceptive approximately 2 years, with no complications HRT no  Hysterectomy?  No BSO?  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  She is now retired.  Her husband Cynthia Vasquez (goes by [employer]") is also a retired Optometrist.  Son Cynthia Vasquez 62 lives in [place] and works for a company that processes [employer] for [employer], but he completed a masters in Chief Executive Officer and is looking for a different job; daughter Cynthia Vasquez lives in [place] and is a Oncologist; son Cynthia Vasquez lives in [place] and runs a business that puts and tennis on cell towers.  The patient has 8 grandchildren.  She attends our [employer].    ADVANCED DIRECTIVES: In the absence of any documents to the contrary the patient's husband is her healthcare power of attorney   HEALTH MAINTENANCE: Social History   Tobacco Use  . Smoking status: Never Smoker  . Smokeless tobacco: Never Used  Vaping Use  . Vaping Use: Never used    Substance Use Topics  . Alcohol use: No  . Drug use: No     Colonoscopy: "Less than 10 years ago"; [facility]  PAP: Remote  Bone density: Never   No Known Allergies  Current Outpatient Medications  Medication Sig Dispense Refill  . acetaminophen (TYLENOL) 500 MG tablet Take 500 mg by mouth every 6 (six) hours as needed for mild pain.     Marland Kitchen b complex vitamins tablet Take 1 tablet by mouth 3 (three) times a week.    . cholecalciferol (VITAMIN D) 1000 UNITS tablet Take 1,000 Units by mouth daily.    Marland Kitchen dexamethasone (DECADRON) 4 MG tablet Counting chemo day as day 1, take 2 tablets by mouth twice a day on days 2 and 3 then in AM day 4, then stop 30 tablet 1  . hydrochlorothiazide (HYDRODIURIL) 25 MG tablet Take 25 mg by mouth daily.     Marland Kitchen latanoprost (XALATAN) 0.005 % ophthalmic solution Place 1 drop into both eyes at bedtime.    . lidocaine-prilocaine (EMLA) cream Apply to affected area once 30 g 3  . LORazepam (ATIVAN) 0.5 MG tablet Take 1 tablet (0.5 mg total) by mouth at bedtime as needed (Nausea or vomiting). 20 tablet 0  .  losartan (COZAAR) 50 MG tablet Take 50 mg by mouth daily.     . Multiple Vitamins-Minerals (MULTIVITAMIN WITH MINERALS) tablet Take 1 tablet by mouth daily.    . pantoprazole (PROTONIX) 40 MG tablet Take 40 mg by mouth every other day.     . prochlorperazine (COMPAZINE) 10 MG tablet Take 1 tablet (10 mg total) by mouth 4 (four) times daily -  before meals and at bedtime. Star the evening of chemotherapy and continue for 3 days 30 tablet 1   No current facility-administered medications for this visit.    OBJECTIVE: White woman who appears stated age  31:   02/08/20 0842  BP: (!) 153/74  Pulse: 78  Resp: 18  Temp: (!) 78 F (25.6 C)  SpO2: 97%     Body mass index is 32.24 kg/m.   Wt Readings from Last 3 Encounters:  02/08/20 159 lb 9.6 oz (72.4 kg)  02/02/20 161 lb 6 oz (73.2 kg)  01/12/20 161 lb 2.5 oz (73.1 kg)      ECOG FS:1 - Symptomatic but  completely ambulatory  Sclerae unicteric, EOMs intact Wearing a mask No cervical or supraclavicular adenopathy Lungs no rales or rhonchi Heart regular rate and rhythm Abd soft, nontender, positive bowel sounds MSK no focal spinal tenderness, no upper extremity lymphedema Neuro: nonfocal, well oriented, appropriate affect Breasts: The right breast is benign.  The left breast is status post lumpectomy.  The cosmetic result is excellent.  The breast hangs very normally, and is only about 8% smaller than the right breast.  In the left armpit there is some subjacent induration which may be scar tissue or a seroma.  She has some limited range of motion there.  The incisions are otherwise healing well, with no erythema or dehiscence.   LAB RESULTS:  CMP     Component Value Date/Time   NA 137 01/26/2020 1515   K 4.0 01/26/2020 1515   CL 103 01/26/2020 1515   CO2 25 01/26/2020 1515   GLUCOSE 103 (H) 01/26/2020 1515   BUN 22 01/26/2020 1515   CREATININE 1.02 (H) 01/26/2020 1515   CREATININE 1.06 (H) 11/29/2019 0845   CALCIUM 9.7 01/26/2020 1515   PROT 8.2 (H) 11/29/2019 0845   ALBUMIN 4.4 11/29/2019 0845   AST 23 11/29/2019 0845   ALT 17 11/29/2019 0845   ALKPHOS 48 11/29/2019 0845   BILITOT 0.6 11/29/2019 0845   GFRNONAA 55 (L) 01/26/2020 1515   GFRNONAA 52 (L) 11/29/2019 0845   GFRAA >60 01/26/2020 1515   GFRAA >60 11/29/2019 0845    No results found for: TOTALPROTELP, ALBUMINELP, A1GS, A2GS, BETS, BETA2SER, GAMS, MSPIKE, SPEI  Lab Results  Component Value Date   WBC 6.7 01/10/2020   NEUTROABS 3.9 11/29/2019   HGB 12.7 01/10/2020   HCT 39.0 01/10/2020   MCV 90.5 01/10/2020   PLT 315 01/10/2020    No results found for: LABCA2  No components found for: YTKPTW656  No results for input(s): INR in the last 168 hours.  No results found for: LABCA2  No results found for: CLE751  No results found for: ZGY174  No results found for: BSW967  No results found for:  CA2729  No components found for: HGQUANT  No results found for: CEA1 / No results found for: CEA1   No results found for: AFPTUMOR  No results found for: CHROMOGRNA  No results found for: KPAFRELGTCHN, LAMBDASER, KAPLAMBRATIO (kappa/lambda light chains)  No results found for: HGBA, HGBA2QUANT, HGBFQUANT, HGBSQUAN (Hemoglobinopathy  evaluation)   Lab Results  Component Value Date   LDH 230 (H) 06/28/2019    No results found for: IRON, TIBC, IRONPCTSAT (Iron and TIBC)  Lab Results  Component Value Date   FERRITIN 626 (H) 07/03/2019    Urinalysis    Component Value Date/Time   COLORURINE YELLOW 06/11/2013 2007   APPEARANCEUR CLEAR 06/11/2013 2007   LABSPEC 1.017 06/11/2013 2007   PHURINE 6.5 06/11/2013 2007   GLUCOSEU NEGATIVE 06/11/2013 2007   HGBUR NEGATIVE 06/11/2013 2007   [place] NEGATIVE 06/11/2013 2007   [place] NEGATIVE 06/11/2013 2007   PROTEINUR NEGATIVE 06/11/2013 2007   UROBILINOGEN 0.2 06/11/2013 2007   NITRITE NEGATIVE 06/11/2013 2007   LEUKOCYTESUR NEGATIVE 06/11/2013 2007     STUDIES: CT Chest W Contrast  Result Date: 01/25/2020 CLINICAL DATA:  Breast cancer, locally advanced breast cancer evaluate for metastases. EXAM: CT CHEST WITH CONTRAST TECHNIQUE: Multidetector CT imaging of the chest was performed during intravenous contrast administration. CONTRAST:  37m OMNIPAQUE IOHEXOL 300 MG/ML  SOLN COMPARISON:  Chest x-rays from 2021, MRI abdomen from April 29, 2018 FINDINGS: Cardiovascular: Calcific and noncalcific atheromatous plaque of the thoracic aorta. Mild tortuosity. No aneurysmal dilation of the thoracic aorta. Heart size mildly enlarged without pericardial effusion. Central pulmonary vasculature on venous phase assessment is unremarkable. There is increased density along the posterior margin of the RIGHT atrium and there is RIGHT heart enlargement in this patient with history of prior ASD repair. Mediastinum/Nodes: No thoracic inlet  adenopathy. No subpectoral adenopathy. No internal mammary adenopathy. No mediastinal adenopathy. No hilar adenopathy. Scattered small lymph nodes in the RIGHT axilla with fatty hila. Small lymph node adjacent to LEFT axillary lymph node dissection changes measures 8 mm (image 48, series 2) Lungs/Pleura: No consolidation. No pleural effusion. 5 x 4 mm LEFT lower lobe pulmonary nodule (image 87, series 5) Upper Abdomen: Incidental imaging of upper abdominal contents with partial imaging of previously characterized LEFT renal cyst arising from anterior LEFT kidney. No acute upper abdominal process. Musculoskeletal: Postoperative changes in the LEFT axilla with area of fluid density adjacent to surgical drain measuring 3.6 x 3.1 cm without peripheral enhancement. Six Hounsfield unit density. Mild pleural irregularity along the RIGHT chest associated with some calcification and mild deformity of the RIGHT posterolateral fourth rib presumably associated with prior thoracotomy. Spinal degenerative changes. No acute or destructive bone process. IMPRESSION: 1. Postoperative changes in the LEFT axilla with area of fluid density adjacent to surgical drain measuring 3.6 x 3.1 cm without peripheral enhancement. Likely postoperative seroma. Drain is at the posterior margin of this collection potentially outside of this area. Small lymph node adjacent to postoperative changes suspected and described on image 48 of series 2, less than a cm. Attention on follow-up. 2. 5 x 4 mm LEFT lower lobe pulmonary nodule. Nonspecific, short interval follow-up may be helpful. 3. Mild pleural irregularity along the RIGHT chest associated with some calcification and mild deformity of the RIGHT posterolateral fourth rib presumably associated with prior thoracotomy in this patient with history of prior ASD repair. 4. Aortic atherosclerosis. Aortic Atherosclerosis (ICD10-I70.0). Electronically Signed   By: GZetta BillsM.D.   On: 01/25/2020 15:36    NM Bone Scan Whole Body  Result Date: 01/27/2020 CLINICAL DATA:  Left breast cancer status post left lumpectomy 01/12/2020, staging evaluation EXAM: NUCLEAR MEDICINE WHOLE BODY BONE SCAN TECHNIQUE: Whole body anterior and posterior images were obtained approximately 3 hours after intravenous injection of radiopharmaceutical. RADIOPHARMACEUTICALS:  21.1 mCi Technetium-923mDP IV COMPARISON:  01/25/2020 FINDINGS: Anterior and posterior whole body planar images are obtained after radiotracer administration. There is physiologic excretion of radiotracer within the kidneys and bladder. Mild low level activity within the lower thoracic spine and lower lumbar spine consistent with spondylosis and facet hypertrophy. Mild degenerative type activity also seen within the bilateral patellofemoral joints and left ankle/foot. There are no focal areas of radiotracer activity to suggest an acute or destructive process. Specifically, no evidence of bony metastases. IMPRESSION: 1. Multifocal degenerative changes, no evidence of bony metastatic disease. Electronically Signed   By: Randa Ngo M.D.   On: 01/27/2020 20:13   MM Breast Surgical Specimen  Result Date: 01/12/2020 CLINICAL DATA:  Status post c/o low eyes LEFT lumpectomy. EXAM: SPECIMEN RADIOGRAPH OF THE LEFT BREAST COMPARISON:  Previous exam(s). FINDINGS: Status post excision of the left breast. The radioactive seed and ribbon shaped biopsy marker clip are present, completely intact, and were marked for pathology. IMPRESSION: Specimen radiograph of the left breast. Electronically Signed   By: Nolon Nations M.D.   On: 01/12/2020 08:24   DG Chest Port 1 View  Result Date: 02/02/2020 CLINICAL DATA:  Port placement. EXAM: PORTABLE CHEST 1 VIEW COMPARISON:  CT chest dated January 25, 2020. Chest x-ray dated November 15, 2019. FINDINGS: New right chest wall port catheter with the tip at the cavoatrial junction. The entire catheter tubing is not included in the field  of view. The heart size and mediastinal contours are within normal limits. Both lungs are clear. The visualized skeletal structures are unremarkable. IMPRESSION: 1. New right chest wall port catheter without definite complicating feature. Please note that th in e entire catheter tubing is not included in the field of view. Electronically Signed   By: Titus Dubin M.D.   On: 02/02/2020 15:00   DG Fluoro Guide CV Line-No Report  Result Date: 02/02/2020 Fluoroscopy was utilized by the requesting physician.  No radiographic interpretation.   MM LT RADIOACTIVE SEED LOC MAMMO GUIDE  Result Date: 01/11/2020 CLINICAL DATA:  74 year old with a biopsy-proven grade 2 invasive malignancy in the UPPER OUTER QUADRANT the LEFT breast with an adjacent satellite 1 cm mass and with at least 3 pathologic LEFT axillary lymph nodes, the largest demonstrating metastatic disease at biopsy. Radioactive seed localization is performed in anticipation of LEFT breast lumpectomy and LEFT axillary node dissection which is scheduled for tomorrow EXAM: MAMMOGRAPHIC GUIDED RADIOACTIVE SEED LOCALIZATION OF THE LEFT BREAST COMPARISON:  Previous exam(s). FINDINGS: Patient presents for radioactive seed localization prior to LEFT breast lumpectomy. I met with the patient and we discussed the procedure of seed localization including benefits and alternatives. We discussed the high likelihood of a successful procedure. We discussed the risks of the procedure including infection, bleeding, tissue injury and further surgery. We discussed the low dose of radioactivity involved in the procedure. Informed, written consent was given. The usual time-out protocol was performed immediately prior to the procedure. Using mammographic guidance, sterile technique with chlorhexidine as skin antisepsis, 1% lidocaine as local anesthesia, an I-125 radioactive seed was used to localize the mass and the associated ribbon shaped tissue marker clip in the UPPER  OUTER LEFT breast using a lateral approach. The follow-up mammogram images confirm the seed is appropriately positioned within the central portion of the mass. The images are marked for Dr. Lucia Gaskins. Follow-up survey of the patient confirms the presence of the radioactive seed. Order number of I-125 seed: [id number] Total activity: 0.246 mCi Reference Date: 01/04/2020 The patient tolerated the procedure well  and was released from the [place]. She was given instructions regarding seed removal. IMPRESSION: Radioactive seed localization of the biopsy-proven malignancy in the UPPER OUTER QUADRANT of the LEFT breast. No apparent complications. Electronically Signed   By: Evangeline Dakin M.D.   On: 01/11/2020 15:05     ELIGIBLE FOR AVAILABLE RESEARCH PROTOCOL: AET  ASSESSMENT: 74 y.o. [place] woman status post left breast upper outer quadrant biopsy 11/17/2019 for a clinical T2 N1, stage IIA invasive ductal carcinoma, grade 2, estrogen and progesterone receptor positive, HER-2 not amplified, with an MIB-1 of 30%.  (1) status post left lumpectomy and axillary lymph node dissection 01/12/2020 for a pT2 pN2, stage IIIA invasive ductal carcinoma, grade 3, with negative margins  (a) a total of 14 axillary lymph nodes removed, 8 positive, with extracapsular extension  (2) adjuvant chemotherapy will consist of doxorubicin and cyclophosphamide in dose dense fashion x4 starting 02/13/2020, followed by weekly paclitaxel x12  (a) echocardiogram 02/08/2020  (3) adjuvant radiation to follow  (4) antiestrogens to start at the completion of local treatment   PLAN: Cynthia Vasquez is appropriately concerned about the intensity and difficulty of the chemotherapy that we have proposed for her.  Assuming her echocardiogram is favorable, she understands that [facility] followed by T is the gold standard and will offer her the best chance of cure.  Unfortunately she does have locally advanced disease and I think [facility] type chemotherapy  is much less likely to give her the long-term results she would like.  If the echocardiogram is unfavorable I would switch her to Cytoxan and Taxotere.  I do think that she will need significant support.  I am scheduling her to see me on day 3 after chemo, the day that she receives Neulasta.  She will get fluids that day and I am also sending her for fluids 2 days after that on Saturday morning.  The goal here is to do everything we can to make this safe and as comfortable as possible for her.  When she gets to the Taxol section of course things will get easier.  If she can get treated every 2 weeks that would be optimal but if that turns out to be too intense we will go to every 3weeks [facility] treatments  She will meet with our chemo teaching nurse today and she will review all the supportive medicines.  I also asked Emelee to keep a symptom diary so when she sees me on July 20 we can discuss how to make her subsequent cycles better.  When she sees me July 28 I will also set her up with physical therapy to start working on left upper extremity mobility.  Total encounter time 40 minutes.Sarajane Jews C. Miklos Bidinger, MD 02/08/2020 9:15 AM Medical Oncology and Hematology [facility] [place], [place] [address] Tel. [phone]    Fax. [phone]   This document serves as a record of services personally performed by Lurline Del, MD. It was created on his behalf by Wilburn Mylar, a trained medical scribe. The creation of this record is based on the scribe's personal observations and the provider's statements to them.   I, Lurline Del MD, have reviewed the above documentation for accuracy and completeness, and I agree with the above.   *Total Encounter Time as defined by the Centers for Medicare and Medicaid Services includes, in addition to the face-to-face time of a patient visit (documented in the note above) non-face-to-face time: obtaining and reviewing outside  history,  ordering and reviewing medications, tests or procedures, care coordination (communications with other health care professionals or caregivers) and documentation in the medical record.

## 2020-02-07 NOTE — Progress Notes (Signed)
Pharmacist Chemotherapy Monitoring - Initial Assessment    Anticipated start date: 02/13/2020   Regimen:  . Are orders appropriate based on the patient's diagnosis, regimen, and cycle? Yes . Does the plan date match the patient's scheduled date? Yes . Is the sequencing of drugs appropriate? Yes . Are the premedications appropriate for the patient's regimen? Yes . Prior Authorization for treatment is: Pending o If applicable, is the correct biosimilar selected based on the patient's insurance? not applicable  Organ Function and Labs: Marland Kitchen Are dose adjustments needed based on the patient's renal function, hepatic function, or hematologic function? Yes . Are appropriate labs ordered prior to the start of patient's treatment? Yes . Other organ system assessment, if indicated: anthracyclines: Echo/ MUGA . The following baseline labs, if indicated, have been ordered: N/A  Dose Assessment: . Are the drug doses appropriate? Yes . Are the following correct: o Drug concentrations Yes o IV fluid compatible with drug Yes o Administration routes Yes o Timing of therapy Yes . If applicable, does the patient have documented access for treatment and/or plans for port-a-cath placement? yes . If applicable, have lifetime cumulative doses been properly documented and assessed? yes Lifetime Dose Tracking  No doses have been documented on this patient for the following tracked chemicals: Doxorubicin, Epirubicin, Idarubicin, Daunorubicin, Mitoxantrone, Bleomycin, Oxaliplatin, Carboplatin, Liposomal Doxorubicin  o   Toxicity Monitoring/Prevention: . The patient has the following take home antiemetics prescribed: N/A . The patient has the following take home medications prescribed: N/A . Medication allergies and previous infusion related reactions, if applicable, have been reviewed and addressed. No . The patient's current medication list has been assessed for drug-drug interactions with their chemotherapy  regimen. no significant drug-drug interactions were identified on review.  Order Review: . Are the treatment plan orders signed? No . Is the patient scheduled to see a provider prior to their treatment? Yes  I verify that I have reviewed each item in the above checklist and answered each question accordingly.  Chantalle Defilippo D 02/07/2020 3:32 PM

## 2020-02-08 ENCOUNTER — Ambulatory Visit (HOSPITAL_COMMUNITY)
Admission: RE | Admit: 2020-02-08 | Discharge: 2020-02-08 | Disposition: A | Discharge status: Home / Self Care | Payer: Medicare HMO | Source: Ambulatory Visit | Attending: Oncology | Admitting: Oncology

## 2020-02-08 ENCOUNTER — Other Ambulatory Visit: Payer: Self-pay

## 2020-02-08 ENCOUNTER — Inpatient Hospital Stay: Payer: Medicare HMO | Attending: Oncology | Admitting: Oncology

## 2020-02-08 ENCOUNTER — Inpatient Hospital Stay: Payer: Medicare HMO

## 2020-02-08 ENCOUNTER — Encounter: Payer: Self-pay | Admitting: *Deleted

## 2020-02-08 VITALS — BP 153/74 | HR 78 | Temp 78.0°F | Resp 18 | Ht 59.0 in | Wt 159.6 lb

## 2020-02-08 DIAGNOSIS — Z808 Family history of malignant neoplasm of other organs or systems: Secondary | ICD-10-CM | POA: Insufficient documentation

## 2020-02-08 DIAGNOSIS — R5383 Other fatigue: Secondary | ICD-10-CM | POA: Insufficient documentation

## 2020-02-08 DIAGNOSIS — Z170 Estrogen receptor positive status [ER+]: Secondary | ICD-10-CM | POA: Insufficient documentation

## 2020-02-08 DIAGNOSIS — R911 Solitary pulmonary nodule: Secondary | ICD-10-CM | POA: Insufficient documentation

## 2020-02-08 DIAGNOSIS — Z5189 Encounter for other specified aftercare: Secondary | ICD-10-CM | POA: Insufficient documentation

## 2020-02-08 DIAGNOSIS — C50412 Malignant neoplasm of upper-outer quadrant of left female breast: Secondary | ICD-10-CM | POA: Diagnosis not present

## 2020-02-08 DIAGNOSIS — Z8774 Personal history of (corrected) congenital malformations of heart and circulatory system: Secondary | ICD-10-CM | POA: Insufficient documentation

## 2020-02-08 DIAGNOSIS — Z818 Family history of other mental and behavioral disorders: Secondary | ICD-10-CM | POA: Insufficient documentation

## 2020-02-08 DIAGNOSIS — Z8249 Family history of ischemic heart disease and other diseases of the circulatory system: Secondary | ICD-10-CM | POA: Insufficient documentation

## 2020-02-08 DIAGNOSIS — Z5111 Encounter for antineoplastic chemotherapy: Secondary | ICD-10-CM | POA: Diagnosis not present

## 2020-02-08 DIAGNOSIS — R110 Nausea: Secondary | ICD-10-CM | POA: Diagnosis not present

## 2020-02-08 DIAGNOSIS — Z79899 Other long term (current) drug therapy: Secondary | ICD-10-CM | POA: Diagnosis not present

## 2020-02-08 DIAGNOSIS — R197 Diarrhea, unspecified: Secondary | ICD-10-CM | POA: Insufficient documentation

## 2020-02-08 DIAGNOSIS — I700 Atherosclerosis of aorta: Secondary | ICD-10-CM | POA: Diagnosis not present

## 2020-02-08 DIAGNOSIS — Z87442 Personal history of urinary calculi: Secondary | ICD-10-CM | POA: Diagnosis not present

## 2020-02-08 DIAGNOSIS — Z803 Family history of malignant neoplasm of breast: Secondary | ICD-10-CM | POA: Insufficient documentation

## 2020-02-08 DIAGNOSIS — I10 Essential (primary) hypertension: Secondary | ICD-10-CM | POA: Diagnosis not present

## 2020-02-08 DIAGNOSIS — K219 Gastro-esophageal reflux disease without esophagitis: Secondary | ICD-10-CM | POA: Insufficient documentation

## 2020-02-08 DIAGNOSIS — C773 Secondary and unspecified malignant neoplasm of axilla and upper limb lymph nodes: Secondary | ICD-10-CM | POA: Insufficient documentation

## 2020-02-08 LAB — ECHOCARDIOGRAM COMPLETE
Height: 59 in
Weight: 2553.6 oz

## 2020-02-08 MED ORDER — LORAZEPAM 0.5 MG PO TABS: 0.5000 mg | ORAL_TABLET | Freq: Every evening | ORAL | 0 refills | Status: DC | PRN

## 2020-02-08 MED ORDER — LIDOCAINE-PRILOCAINE 2.5-2.5 % EX CREA: TOPICAL_CREAM | CUTANEOUS | 3 refills | Status: DC

## 2020-02-08 MED ORDER — PROCHLORPERAZINE MALEATE 10 MG PO TABS: 10.0000 mg | ORAL_TABLET | Freq: Three times a day (TID) | ORAL | 1 refills | Status: DC

## 2020-02-08 MED ORDER — DEXAMETHASONE 4 MG PO TABS: ORAL_TABLET | ORAL | 1 refills | Status: DC

## 2020-02-08 NOTE — Progress Notes (Signed)
Echocardiogram 2D Echocardiogram has been performed.  Oneal Deputy Chyann Ambrocio 02/08/2020, 2:21 PM

## 2020-02-12 NOTE — Progress Notes (Signed)
Pharmacist Chemotherapy Monitoring - Initial Assessment    Anticipated start date: 02/13/20   Regimen:   Are orders appropriate based on the patients diagnosis, regimen, and cycle? Yes  Does the plan date match the patients scheduled date? Yes  Is the sequencing of drugs appropriate? Yes  Are the premedications appropriate for the patients regimen? Yes  Prior Authorization for treatment is: Approved o If applicable, is the correct biosimilar selected based on the patient's insurance? yes  Organ Function and Labs:  Are dose adjustments needed based on the patient's renal function, hepatic function, or hematologic function? No  Are appropriate labs ordered prior to the start of patient's treatment? Yes  Other organ system assessment, if indicated: anthracyclines: Echo/ MUGA  The following baseline labs, if indicated, have been ordered: N/A  Dose Assessment:  Are the drug doses appropriate? Yes  Are the following correct: o Drug concentrations Yes o IV fluid compatible with drug Yes o Administration routes Yes o Timing of therapy Yes  If applicable, does the patient have documented access for treatment and/or plans for port-a-cath placement? yes  If applicable, have lifetime cumulative doses been properly documented and assessed? yes Lifetime Dose Tracking  No doses have been documented on this patient for the following tracked chemicals: Doxorubicin, Epirubicin, Idarubicin, Daunorubicin, Mitoxantrone, Bleomycin, Oxaliplatin, Carboplatin, Liposomal Doxorubicin  o   Toxicity Monitoring/Prevention:  The patient has the following take home antiemetics prescribed: Prochlorperazine, Dexamethasone and Lorazepam  The patient has the following take home medications prescribed: N/A  Medication allergies and previous infusion related reactions, if applicable, have been reviewed and addressed. Yes  The patient's current medication list has been assessed for drug-drug  interactions with their chemotherapy regimen. no significant drug-drug interactions were identified on review.  Order Review:  Are the treatment plan orders signed? Yes  Is the patient scheduled to see a provider prior to their treatment? No  I verify that I have reviewed each item in the above checklist and answered each question accordingly.  Norwood Levo [facility] 02/12/2020 12:17 PM

## 2020-02-13 ENCOUNTER — Inpatient Hospital Stay: Payer: Medicare HMO

## 2020-02-13 ENCOUNTER — Other Ambulatory Visit: Payer: Medicare HMO

## 2020-02-13 ENCOUNTER — Other Ambulatory Visit: Payer: Self-pay

## 2020-02-13 ENCOUNTER — Encounter: Payer: Self-pay | Admitting: *Deleted

## 2020-02-13 ENCOUNTER — Encounter: Payer: Self-pay | Admitting: Oncology

## 2020-02-13 ENCOUNTER — Other Ambulatory Visit: Payer: Self-pay | Admitting: Oncology

## 2020-02-13 ENCOUNTER — Ambulatory Visit: Payer: Medicare HMO

## 2020-02-13 VITALS — BP 133/56 | HR 64 | Temp 99.2°F | Resp 17

## 2020-02-13 DIAGNOSIS — R5383 Other fatigue: Secondary | ICD-10-CM | POA: Diagnosis not present

## 2020-02-13 DIAGNOSIS — C50412 Malignant neoplasm of upper-outer quadrant of left female breast: Secondary | ICD-10-CM

## 2020-02-13 DIAGNOSIS — C773 Secondary and unspecified malignant neoplasm of axilla and upper limb lymph nodes: Secondary | ICD-10-CM | POA: Diagnosis not present

## 2020-02-13 DIAGNOSIS — Z95828 Presence of other vascular implants and grafts: Secondary | ICD-10-CM

## 2020-02-13 DIAGNOSIS — R110 Nausea: Secondary | ICD-10-CM | POA: Diagnosis not present

## 2020-02-13 DIAGNOSIS — R197 Diarrhea, unspecified: Secondary | ICD-10-CM | POA: Diagnosis not present

## 2020-02-13 DIAGNOSIS — Z5111 Encounter for antineoplastic chemotherapy: Secondary | ICD-10-CM | POA: Diagnosis not present

## 2020-02-13 DIAGNOSIS — R911 Solitary pulmonary nodule: Secondary | ICD-10-CM | POA: Diagnosis not present

## 2020-02-13 DIAGNOSIS — Z170 Estrogen receptor positive status [ER+]: Secondary | ICD-10-CM | POA: Diagnosis not present

## 2020-02-13 DIAGNOSIS — Z5189 Encounter for other specified aftercare: Secondary | ICD-10-CM | POA: Diagnosis not present

## 2020-02-13 LAB — CBC WITH DIFFERENTIAL/PLATELET
Abs Immature Granulocytes: 0.02 10*3/uL (ref 0.00–0.07)
Basophils Absolute: 0.1 10*3/uL (ref 0.0–0.1)
Basophils Relative: 1 %
Eosinophils Absolute: 0.3 10*3/uL (ref 0.0–0.5)
Eosinophils Relative: 4 %
HCT: 34.7 % — ABNORMAL LOW (ref 36.0–46.0)
Hemoglobin: 11.6 g/dL — ABNORMAL LOW (ref 12.0–15.0)
Immature Granulocytes: 0 %
Lymphocytes Relative: 31 %
Lymphs Abs: 2.4 10*3/uL (ref 0.7–4.0)
MCH: 30.1 pg (ref 26.0–34.0)
MCHC: 33.4 g/dL (ref 30.0–36.0)
MCV: 90.1 fL (ref 80.0–100.0)
Monocytes Absolute: 0.7 10*3/uL (ref 0.1–1.0)
Monocytes Relative: 9 %
Neutro Abs: 4.2 10*3/uL (ref 1.7–7.7)
Neutrophils Relative %: 55 %
Platelets: 343 10*3/uL (ref 150–400)
RBC: 3.85 MIL/uL — ABNORMAL LOW (ref 3.87–5.11)
RDW: 14 % (ref 11.5–15.5)
WBC: 7.6 10*3/uL (ref 4.0–10.5)
nRBC: 0 % (ref 0.0–0.2)

## 2020-02-13 LAB — COMPREHENSIVE METABOLIC PANEL
ALT: 43 U/L (ref 0–44)
AST: 30 U/L (ref 15–41)
Albumin: 3.9 g/dL (ref 3.5–5.0)
Alkaline Phosphatase: 108 U/L (ref 38–126)
Anion gap: 9 (ref 5–15)
BUN: 28 mg/dL — ABNORMAL HIGH (ref 8–23)
CO2: 26 mmol/L (ref 22–32)
Calcium: 9.9 mg/dL (ref 8.9–10.3)
Chloride: 102 mmol/L (ref 98–111)
Creatinine, Ser: 1.02 mg/dL — ABNORMAL HIGH (ref 0.44–1.00)
GFR calc Af Amer: >60 mL/min (ref >60)
GFR calc non Af Amer: 55 mL/min — ABNORMAL LOW (ref >60)
Glucose, Bld: 97 mg/dL (ref 70–99)
Potassium: 3.9 mmol/L (ref 3.5–5.1)
Sodium: 137 mmol/L (ref 135–145)
Total Bilirubin: 0.5 mg/dL (ref 0.3–1.2)
Total Protein: 7.7 g/dL (ref 6.5–8.1)

## 2020-02-13 MED ORDER — PALONOSETRON HCL INJECTION 0.25 MG/5ML
INTRAVENOUS | Status: AC
  Filled 2020-02-13: qty 5

## 2020-02-13 MED ORDER — SODIUM CHLORIDE 0.9 % IV SOLN
600.0000 mg/m2 | Freq: Once | INTRAVENOUS | Status: AC
  Administered 2020-02-13: 1060 mg via INTRAVENOUS
  Filled 2020-02-13: qty 53

## 2020-02-13 MED ORDER — SODIUM CHLORIDE 0.9 % IV SOLN
150.0000 mg | Freq: Once | INTRAVENOUS | Status: AC
  Administered 2020-02-13: 150 mg via INTRAVENOUS
  Filled 2020-02-13: qty 150

## 2020-02-13 MED ORDER — SODIUM CHLORIDE 0.9% FLUSH
10.0000 mL | INTRAVENOUS | Status: DC | PRN
  Administered 2020-02-13: 10 mL
  Filled 2020-02-13: qty 10

## 2020-02-13 MED ORDER — HEPARIN SOD (PORK) LOCK FLUSH 100 UNIT/ML IV SOLN
500.0000 [IU] | Freq: Once | INTRAVENOUS | Status: AC | PRN
  Administered 2020-02-13: 500 [IU]
  Filled 2020-02-13: qty 5

## 2020-02-13 MED ORDER — HEPARIN SOD (PORK) LOCK FLUSH 100 UNIT/ML IV SOLN
500.0000 [IU] | Freq: Once | INTRAVENOUS | Status: AC
  Administered 2020-02-13: 500 [IU] via INTRAVENOUS
  Filled 2020-02-13: qty 5

## 2020-02-13 MED ORDER — SODIUM CHLORIDE 0.9% FLUSH
10.0000 mL | INTRAVENOUS | Status: DC | PRN
  Administered 2020-02-13 (×2): 10 mL via INTRAVENOUS
  Filled 2020-02-13: qty 10

## 2020-02-13 MED ORDER — SODIUM CHLORIDE 0.9 % IV SOLN
Freq: Once | INTRAVENOUS | Status: DC
  Filled 2020-02-13: qty 250

## 2020-02-13 MED ORDER — SODIUM CHLORIDE 0.9 % IV SOLN
10.0000 mg | Freq: Once | INTRAVENOUS | Status: AC
  Administered 2020-02-13: 10 mg via INTRAVENOUS
  Filled 2020-02-13: qty 10

## 2020-02-13 MED ORDER — PALONOSETRON HCL INJECTION 0.25 MG/5ML
0.2500 mg | Freq: Once | INTRAVENOUS | Status: AC
  Administered 2020-02-13: 0.25 mg via INTRAVENOUS

## 2020-02-13 MED ORDER — DOXORUBICIN HCL CHEMO IV INJECTION 2 MG/ML
60.0000 mg/m2 | Freq: Once | INTRAVENOUS | Status: AC
  Administered 2020-02-13: 106 mg via INTRAVENOUS
  Filled 2020-02-13: qty 53

## 2020-02-13 NOTE — Progress Notes (Signed)
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  Unfortunately there aren't any foundations offering copay assistance for her Dx and the type of ins she has.  I offered the [employer], went over what it covers and gave her an expense sheet.  She would like to discuss it with her husband so I gave her my card to contact me if she would like to apply for the grant.

## 2020-02-13 NOTE — Patient Instructions (Signed)
[place] Discharge Instructions for Patients Receiving Chemotherapy  Today you received the following chemotherapy agents Doxorubicin, Cytoxan.  To help prevent nausea and vomiting after your treatment, we encourage you to take your nausea medication DO NOT TAKE ZOFRAN FOR THREE DAYS AFTER TREATMENT.   If you develop nausea and vomiting that is not controlled by your nausea medication, call the clinic.   BELOW ARE SYMPTOMS THAT SHOULD BE REPORTED IMMEDIATELY:  *FEVER GREATER THAN 100.5 F  *CHILLS WITH OR WITHOUT FEVER  NAUSEA AND VOMITING THAT IS NOT CONTROLLED WITH YOUR NAUSEA MEDICATION  *UNUSUAL SHORTNESS OF BREATH  *UNUSUAL BRUISING OR BLEEDING  TENDERNESS IN MOUTH AND THROAT WITH OR WITHOUT PRESENCE OF ULCERS  *URINARY PROBLEMS  *BOWEL PROBLEMS  UNUSUAL RASH Items with * indicate a potential emergency and should be followed up as soon as possible.  Feel free to call the clinic should you have any questions or concerns. The clinic phone number is [phone].  Please show the [facility] at check-in to the Emergency Department and triage nurse.  Cyclophosphamide Injection What is this medicine? CYCLOPHOSPHAMIDE (sye kloe FOSS fa mide) is a chemotherapy drug. It slows the growth of cancer cells. This medicine is used to treat many types of cancer like lymphoma, myeloma, leukemia, breast cancer, and ovarian cancer, to name a few. This medicine may be used for other purposes; ask your health care provider or pharmacist if you have questions. COMMON BRAND NAME(S): Cytoxan, Neosar What should I tell my health care provider before I take this medicine? They need to know if you have any of these conditions:  heart disease  history of irregular heartbeat  infection  kidney disease  liver disease  low blood counts, like white cells, platelets, or red blood cells  on hemodialysis  recent or ongoing radiation therapy  scarring or thickening  of the lungs  trouble passing urine  an unusual or allergic reaction to cyclophosphamide, other medicines, foods, dyes, or preservatives  pregnant or trying to get pregnant  breast-feeding How should I use this medicine? This drug is usually given as an injection into a vein or muscle or by infusion into a vein. It is administered in a hospital or clinic by a specially trained health care professional. Talk to your pediatrician regarding the use of this medicine in children. Special care may be needed. Overdosage: If you think you have taken too much of this medicine contact a poison control center or emergency room at once. NOTE: This medicine is only for you. Do not share this medicine with others. What if I miss a dose? It is important not to miss your dose. Call your doctor or health care professional if you are unable to keep an appointment. What may interact with this medicine?  amphotericin B  azathioprine  certain antivirals for HIV or hepatitis  certain medicines for blood pressure, heart disease, irregular heart beat  certain medicines that treat or prevent blood clots like warfarin  certain other medicines for cancer  cyclosporine  etanercept  indomethacin  medicines that relax muscles for surgery  medicines to increase blood counts  metronidazole This list may not describe all possible interactions. Give your health care provider a list of all the medicines, herbs, non-prescription drugs, or dietary supplements you use. Also tell them if you smoke, drink alcohol, or use illegal drugs. Some items may interact with your medicine. What should I watch for while using this medicine? Your condition will be monitored  carefully while you are receiving this medicine. You may need blood work done while you are taking this medicine. Drink water or other fluids as directed. Urinate often, even at night. Some products may contain alcohol. Ask your health care professional  if this medicine contains alcohol. Be sure to tell all health care professionals you are taking this medicine. Certain medicines, like metronidazole and disulfiram, can cause an unpleasant reaction when taken with alcohol. The reaction includes flushing, headache, nausea, vomiting, sweating, and increased thirst. The reaction can last from 30 minutes to several hours. Do not become pregnant while taking this medicine or for 1 year after stopping it. Women should inform their health care professional if they wish to become pregnant or think they might be pregnant. Men should not father a child while taking this medicine and for 4 months after stopping it. There is potential for serious side effects to an unborn child. Talk to your health care professional for more information. Do not breast-feed an infant while taking this medicine or for 1 week after stopping it. This medicine has caused ovarian failure in some women. This medicine may make it more difficult to get pregnant. Talk to your health care professional if you are concerned about your fertility. This medicine has caused decreased sperm counts in some men. This may make it more difficult to father a child. Talk to your health care professional if you are concerned about your fertility. Call your health care professional for advice if you get a fever, chills, or sore throat, or other symptoms of a cold or flu. Do not treat yourself. This medicine decreases your body's ability to fight infections. Try to avoid being around people who are sick. Avoid taking medicines that contain aspirin, acetaminophen, ibuprofen, naproxen, or ketoprofen unless instructed by your health care professional. These medicines may hide a fever. Talk to your health care professional about your risk of cancer. You may be more at risk for certain types of cancer if you take this medicine. If you are going to need surgery or other procedure, tell your health care professional  that you are using this medicine. Be careful brushing or flossing your teeth or using a toothpick because you may get an infection or bleed more easily. If you have any dental work done, tell your dentist you are receiving this medicine. What side effects may I notice from receiving this medicine? Side effects that you should report to your doctor or health care professional as soon as possible:  allergic reactions like skin rash, itching or hives, swelling of the face, lips, or tongue  breathing problems  nausea, vomiting  signs and symptoms of bleeding such as bloody or black, tarry stools; red or dark brown urine; spitting up blood or brown material that looks like coffee grounds; red spots on the skin; unusual bruising or bleeding from the eyes, gums, or nose  signs and symptoms of heart failure like fast, irregular heartbeat, sudden weight gain; swelling of the ankles, feet, hands  signs and symptoms of infection like fever; chills; cough; sore throat; pain or trouble passing urine  signs and symptoms of kidney injury like trouble passing urine or change in the amount of urine  signs and symptoms of liver injury like dark yellow or brown urine; general ill feeling or flu-like symptoms; light-colored stools; loss of appetite; nausea; right upper belly pain; unusually weak or tired; yellowing of the eyes or skin Side effects that usually do not require medical attention (report  to your doctor or health care professional if they continue or are bothersome):  confusion  decreased hearing  diarrhea  facial flushing  hair loss  headache  loss of appetite  missed menstrual periods  signs and symptoms of low red blood cells or anemia such as unusually weak or tired; feeling faint or lightheaded; falls  skin discoloration This list may not describe all possible side effects. Call your doctor for medical advice about side effects. You may report side effects to FDA at  1-800-FDA-1088. Where should I keep my medicine? This drug is given in a hospital or clinic and will not be stored at home. NOTE: This sheet is a summary. It may not cover all possible information. If you have questions about this medicine, talk to your doctor, pharmacist, or health care provider.  2020 Elsevier/Gold Standard (2019-04-24 09:53:29) Doxorubicin injection What is this medicine? DOXORUBICIN (dox oh ROO bi sin) is a chemotherapy drug. It is used to treat many kinds of cancer like leukemia, lymphoma, neuroblastoma, sarcoma, and Wilms' tumor. It is also used to treat bladder cancer, breast cancer, lung cancer, ovarian cancer, stomach cancer, and thyroid cancer. This medicine may be used for other purposes; ask your health care provider or pharmacist if you have questions. COMMON BRAND NAME(S): Adriamycin, Adriamycin PFS, Adriamycin RDF, Rubex What should I tell my health care provider before I take this medicine? They need to know if you have any of these conditions:  heart disease  history of low blood counts caused by a medicine  liver disease  recent or ongoing radiation therapy  an unusual or allergic reaction to doxorubicin, other chemotherapy agents, other medicines, foods, dyes, or preservatives  pregnant or trying to get pregnant  breast-feeding How should I use this medicine? This drug is given as an infusion into a vein. It is administered in a hospital or clinic by a specially trained health care professional. If you have pain, swelling, burning or any unusual feeling around the site of your injection, tell your health care professional right away. Talk to your pediatrician regarding the use of this medicine in children. Special care may be needed. Overdosage: If you think you have taken too much of this medicine contact a poison control center or emergency room at once. NOTE: This medicine is only for you. Do not share this medicine with others. What if I miss a  dose? It is important not to miss your dose. Call your doctor or health care professional if you are unable to keep an appointment. What may interact with this medicine? This medicine may interact with the following medications:  6-mercaptopurine  paclitaxel  phenytoin  St. John's Wort  trastuzumab  verapamil This list may not describe all possible interactions. Give your health care provider a list of all the medicines, herbs, non-prescription drugs, or dietary supplements you use. Also tell them if you smoke, drink alcohol, or use illegal drugs. Some items may interact with your medicine. What should I watch for while using this medicine? This drug may make you feel generally unwell. This is not uncommon, as chemotherapy can affect healthy cells as well as cancer cells. Report any side effects. Continue your course of treatment even though you feel ill unless your doctor tells you to stop. There is a maximum amount of this medicine you should receive throughout your life. The amount depends on the medical condition being treated and your overall health. Your doctor will watch how much of this medicine you receive  in your lifetime. Tell your doctor if you have taken this medicine before. You may need blood work done while you are taking this medicine. Your urine may turn red for a few days after your dose. This is not blood. If your urine is dark or brown, call your doctor. In some cases, you may be given additional medicines to help with side effects. Follow all directions for their use. Call your doctor or health care professional for advice if you get a fever, chills or sore throat, or other symptoms of a cold or flu. Do not treat yourself. This drug decreases your body's ability to fight infections. Try to avoid being around people who are sick. This medicine may increase your risk to bruise or bleed. Call your doctor or health care professional if you notice any unusual bleeding. Talk  to your doctor about your risk of cancer. You may be more at risk for certain types of cancers if you take this medicine. Do not become pregnant while taking this medicine or for 6 months after stopping it. Women should inform their doctor if they wish to become pregnant or think they might be pregnant. Men should not father a child while taking this medicine and for 6 months after stopping it. There is a potential for serious side effects to an unborn child. Talk to your health care professional or pharmacist for more information. Do not breast-feed an infant while taking this medicine. This medicine has caused ovarian failure in some women and reduced sperm counts in some men This medicine may interfere with the ability to have a child. Talk with your doctor or health care professional if you are concerned about your fertility. This medicine may cause a decrease in Co-Enzyme Q-10. You should make sure that you get enough Co-Enzyme Q-10 while you are taking this medicine. Discuss the foods you eat and the vitamins you take with your health care professional. What side effects may I notice from receiving this medicine? Side effects that you should report to your doctor or health care professional as soon as possible:  allergic reactions like skin rash, itching or hives, swelling of the face, lips, or tongue  breathing problems  chest pain  fast or irregular heartbeat  low blood counts - this medicine may decrease the number of white blood cells, red blood cells and platelets. You may be at increased risk for infections and bleeding.  pain, redness, or irritation at site where injected  signs of infection - fever or chills, cough, sore throat, pain or difficulty passing urine  signs of decreased platelets or bleeding - bruising, pinpoint red spots on the skin, black, tarry stools, blood in the urine  swelling of the ankles, feet, hands  tiredness  weakness Side effects that usually do not  require medical attention (report to your doctor or health care professional if they continue or are bothersome):  diarrhea  hair loss  mouth sores  nail discoloration or damage  nausea  red colored urine  vomiting This list may not describe all possible side effects. Call your doctor for medical advice about side effects. You may report side effects to FDA at 1-800-FDA-1088. Where should I keep my medicine? This drug is given in a hospital or clinic and will not be stored at home. NOTE: This sheet is a summary. It may not cover all possible information. If you have questions about this medicine, talk to your doctor, pharmacist, or health care provider.  2020 Elsevier/Gold Standard (  2017-03-03 11:01:26)  

## 2020-02-13 NOTE — Progress Notes (Signed)
First time Doxo and Cytoxan. Pt did well. Extensive education completed. Nutrition offered. Education Programmer, applications and sent home with Pt.Marland Kitchen

## 2020-02-14 LAB — CANCER ANTIGEN 27.29: CA 27.29: 10.4 U/mL (ref 0.0–38.6)

## 2020-02-15 ENCOUNTER — Inpatient Hospital Stay: Payer: Medicare HMO | Admitting: Oncology

## 2020-02-15 ENCOUNTER — Inpatient Hospital Stay: Payer: Medicare HMO

## 2020-02-15 ENCOUNTER — Other Ambulatory Visit: Payer: Self-pay

## 2020-02-15 VITALS — BP 146/59 | HR 78 | Temp 98.7°F | Resp 18 | Ht 59.0 in | Wt 165.6 lb

## 2020-02-15 DIAGNOSIS — Z170 Estrogen receptor positive status [ER+]: Secondary | ICD-10-CM

## 2020-02-15 DIAGNOSIS — Z5189 Encounter for other specified aftercare: Secondary | ICD-10-CM | POA: Diagnosis not present

## 2020-02-15 DIAGNOSIS — R197 Diarrhea, unspecified: Secondary | ICD-10-CM | POA: Diagnosis not present

## 2020-02-15 DIAGNOSIS — Z5111 Encounter for antineoplastic chemotherapy: Secondary | ICD-10-CM | POA: Diagnosis not present

## 2020-02-15 DIAGNOSIS — R5383 Other fatigue: Secondary | ICD-10-CM | POA: Diagnosis not present

## 2020-02-15 DIAGNOSIS — R110 Nausea: Secondary | ICD-10-CM | POA: Diagnosis not present

## 2020-02-15 DIAGNOSIS — R911 Solitary pulmonary nodule: Secondary | ICD-10-CM | POA: Diagnosis not present

## 2020-02-15 DIAGNOSIS — C50412 Malignant neoplasm of upper-outer quadrant of left female breast: Secondary | ICD-10-CM

## 2020-02-15 DIAGNOSIS — C773 Secondary and unspecified malignant neoplasm of axilla and upper limb lymph nodes: Secondary | ICD-10-CM | POA: Diagnosis not present

## 2020-02-15 MED ORDER — PEGFILGRASTIM-JMDB 6 MG/0.6ML ~~LOC~~ SOSY
6.0000 mg | PREFILLED_SYRINGE | Freq: Once | SUBCUTANEOUS | Status: AC
  Administered 2020-02-15: 6 mg via SUBCUTANEOUS

## 2020-02-15 MED ORDER — PEGFILGRASTIM-JMDB 6 MG/0.6ML ~~LOC~~ SOSY
PREFILLED_SYRINGE | SUBCUTANEOUS | Status: AC
  Filled 2020-02-15: qty 0.6

## 2020-02-15 MED ORDER — ACETAMINOPHEN 500 MG PO TABS: 500.0000 mg | ORAL_TABLET | Freq: Three times a day (TID) | ORAL | 0 refills | Status: AC | PRN

## 2020-02-15 MED ORDER — TRAMADOL HCL 50 MG PO TABS: 50.0000 mg | ORAL_TABLET | Freq: Four times a day (QID) | ORAL | 0 refills | Status: DC | PRN

## 2020-02-15 MED ORDER — NAPROXEN SODIUM 220 MG PO TABS
220.0000 mg | ORAL_TABLET | Freq: Three times a day (TID) | ORAL | 3 refills | Status: DC | PRN
Start: 2020-02-15 — End: 2024-04-04

## 2020-02-15 NOTE — Progress Notes (Signed)
[place]  [phone] Fax:([phone]     ID: Cynthia Vasquez DOB: 1946-02-15  MR#: [id number]  SEG#:[id number]  Patient Care Team: Sueanne Margarita, DO as PCP - General (Internal Medicine) Rockwell Germany, RN as Oncology Nurse Navigator Mauro Kaufmann, RN as Oncology Nurse Navigator Constantin Hillery, Virgie Dad, MD as Consulting Physician (Oncology) Alphonsa Overall, MD as Consulting Physician (General Surgery) Kyung Rudd, MD as Consulting Physician (Radiation Oncology) Wilford Corner, MD as Consulting Physician (Gastroenterology) Chauncey Cruel, MD OTHER MD:  CHIEF COMPLAINT: Estrogen receptor positive breast cancer  CURRENT TREATMENT: adjuvant chemotherapy    INTERVAL HISTORY: Cynthia Vasquez returns today for follow-up and treatment of her estrogen receptor positive breast cancer. She was last seen here on 02/08/2020.   She started adjuvant chemotherapy consisting of doxorubicin and cyclophosphamide on 02/13/2020. Today is day 3 cycle 1.   Since her last visit here, she has not undergone any additional studies.   Lab Results  Component Value Date   CA2729 10.4 02/13/2020    REVIEW OF SYSTEMS: Cynthia Vasquez did generally quite well with the first 3 days of chemo.  Inadvertently she doubled up on the Compazine dose and that made her pretty jittery.  She corrected that.  She had no trouble from the Decadron.  She has had no nausea no vomiting and she has had no trouble keeping herself well-hydrated.  We had scheduled her for fluids today and Saturday just in case but she does not think she needs them.  A detailed review of systems today was otherwise stable   HISTORY OF CURRENT ILLNESS: From the original intake note:  Cynthia Vasquez herself palpated a left breast mass, with associated dimpling, tenderness, and pinching pain. She underwent bilateral diagnostic mammography with tomography and left breast ultrasonography at [facility] on 11/09/2019 showing: breast density  category C; 2.9 cm irregular mass in left breast at 2:30 corresponding to palpable abnormality; three enlarged left axillary lymph nodes.  Accordingly on 11/17/2019 she proceeded to biopsy of the left breast area in question. The pathology from this procedure ([id number]) showed: invasive mammary carcinoma, grade 2, e-cadherin positive. Prognostic indicators significant for: estrogen receptor, 95% positive and progesterone receptor, 95% positive, both with strong staining intensity. Proliferation marker Ki67 at 30%. HER2 negative by immunohistochemistry (1+).  The biopsied lymph node was positive for metastatic carcinoma.  The patient's subsequent history is as detailed below.   PAST MEDICAL HISTORY: Past Medical History:  Diagnosis Date  . Atrial septal defect    repaired age 45  . Cancer ([place]) 01/2020   IDC left breast  . GERD (gastroesophageal reflux disease)   . Glaucoma   . Headache    migraines until age 59- 13, have them 3-4 x year  . History of blood transfusion    with ASD surgery at age 10  . History of kidney stones    passed stones  . Hypertension   . Vitamin D deficiency     PAST SURGICAL HISTORY: Past Surgical History:  Procedure Laterality Date  . ASD REPAIR     age 89  . BREAST LUMPECTOMY WITH RADIOACTIVE SEED AND AXILLARY LYMPH NODE DISSECTION Left 01/12/2020   Procedure: LEFT BREAST LUMPECTOMY WITH RADIOACTIVE SEED AND LEFT AXILLARY LYMPH NODE DISSECTION;  Surgeon: Alphonsa Overall, MD;  Location: [facility];  Service: General;  Laterality: Left;  PEC BLOCK  . COLONOSCOPY    . PORTACATH PLACEMENT Right 02/02/2020   Procedure: INSERTION PORT-A-CATH WITH ULTRASOUND GUIDANCE;  Surgeon: Alphonsa Overall,  MD;  Location: [facility];  Service: General;  Laterality: Right;  . UPPER GI ENDOSCOPY      FAMILY HISTORY: Family History  Problem Relation Age of Onset  . Dementia Mother   . Heart attack Father   . Hypertension Father   . Breast cancer Sister 35    The patient's father died at age 62 from a myocardial infarction.  The patient's mother died at 70 with Alzheimer's disease.  The patient has 2 brothers, 2 sisters.  1 sister developed breast cancer at the age of 1.  The patient's daughter has a history of thyroid cancer.  She has not been genetically tested.  There is no other history of cancer in the family to the patient's knowledge.   GYNECOLOGIC HISTORY:  No LMP recorded (lmp unknown). Patient is postmenopausal. Menarche: 75 years old Age at first live birth: 74 years old [place] P 3 LMP age 22 Contraceptive approximately 2 years, with no complications HRT no  Hysterectomy?  No BSO?  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  She is now retired.  Her husband [name] (goes by [employer]") is also a retired Optometrist.  Son [name] 76 lives in [place] and works for a company that processes [employer] for [employer], but he completed a masters in Chief Executive Officer and is looking for a different job; daughter [name] lives in [place] and is a Oncologist; son [name] lives in [place] and runs a business that puts and tennis on cell towers.  The patient has 8 grandchildren.  She attends our [employer].    ADVANCED DIRECTIVES: In the absence of any documents to the contrary the patient's husband is her healthcare power of attorney   HEALTH MAINTENANCE: Social History   Tobacco Use  . Smoking status: Never Smoker  . Smokeless tobacco: Never Used  Vaping Use  . Vaping Use: Never used  Substance Use Topics  . Alcohol use: No  . Drug use: No     Colonoscopy: "Less than 10 years ago"; [facility]  PAP: Remote  Bone density: Never   No Known Allergies  Current Outpatient Medications  Medication Sig Dispense Refill  . acetaminophen (TYLENOL) 500 MG tablet Take 500 mg by mouth every 6 (six) hours as needed for mild pain.     Cynthia Vasquez Kitchen acetaminophen (TYLENOL) 500 MG tablet  Take 1 tablet (500 mg total) by mouth every 8 (eight) hours as needed. Take with aleve 220 md 60 tablet 0  . b complex vitamins tablet Take 1 tablet by mouth 3 (three) times a week.    . calcium gluconate 500 MG tablet Take 1 tablet (500 mg total) by mouth 3 (three) times daily.    . cholecalciferol (VITAMIN D) 1000 UNITS tablet Take 1,000 Units by mouth daily.    Cynthia Vasquez Kitchen dexamethasone (DECADRON) 4 MG tablet Counting chemo day as day 1, take 2 tablets by mouth twice a day on days 2 and 3 then in AM day 4, then stop 30 tablet 1  . hydrochlorothiazide (HYDRODIURIL) 25 MG tablet Take 25 mg by mouth daily.     Cynthia Vasquez Kitchen latanoprost (XALATAN) 0.005 % ophthalmic solution Place 1 drop into both eyes at bedtime.    . lidocaine-prilocaine (EMLA) cream Apply to affected area once 30 g 3  . LORazepam (ATIVAN) 0.5 MG tablet Take 1 tablet (0.5 mg total) by mouth at bedtime as needed (Nausea or vomiting). 20 tablet  0  . losartan (COZAAR) 50 MG tablet Take 50 mg by mouth daily.     . Multiple Vitamins-Minerals (MULTIVITAMIN WITH MINERALS) tablet Take 1 tablet by mouth daily.    . naproxen sodium (ALEVE) 220 MG tablet Take 1 tablet (220 mg total) by mouth every 8 (eight) hours as needed. Take with tylenol 500 mg. 60 tablet 3  . pantoprazole (PROTONIX) 40 MG tablet Take 40 mg by mouth every other day.     . prochlorperazine (COMPAZINE) 10 MG tablet Take 1 tablet (10 mg total) by mouth 4 (four) times daily -  before meals and at bedtime. Star the evening of chemotherapy and continue for 3 days 30 tablet 1  . traMADol (ULTRAM) 50 MG tablet Take 1-2 tablets (50-100 mg total) by mouth every 6 (six) hours as needed. 60 tablet 0   No current facility-administered medications for this visit.    OBJECTIVE: White woman in no acute distress  Vitals:   02/15/20 1316  BP: (!) 146/59  Pulse: 78  Resp: 18  Temp: 98.7 F (37.1 C)  SpO2: 97%   Wt Readings from Last 3 Encounters:  02/15/20 165 lb 9.6 oz (75.1 kg)  02/08/20 159 lb  9.6 oz (72.4 kg)  02/02/20 161 lb 6 oz (73.2 kg)   Body mass index is 33.45 kg/m.    ECOG FS:1 - Symptomatic but completely ambulatory  Ocular: Sclerae unicteric, pupils round and equal Ear-nose-throat: Wearing a mask Lymphatic: No cervical or supraclavicular adenopathy Lungs no rales or rhonchi Heart regular rate and rhythm Abd soft, nontender, positive bowel sounds MSK no focal spinal tenderness, no joint edema Neuro: non-focal, well-oriented, appropriate affect Breasts: The right breast is unremarkable.  The left breast has undergone lumpectomy.  There is no evidence of dehiscence erythema or swelling.  Both axillae are benign.  LAB RESULTS:  CMP     Component Value Date/Time   NA 137 02/13/2020 1306   K 3.9 02/13/2020 1306   CL 102 02/13/2020 1306   CO2 26 02/13/2020 1306   GLUCOSE 97 02/13/2020 1306   BUN 28 (H) 02/13/2020 1306   CREATININE 1.02 (H) 02/13/2020 1306   CREATININE 1.06 (H) 11/29/2019 0845   CALCIUM 9.9 02/13/2020 1306   PROT 7.7 02/13/2020 1306   ALBUMIN 3.9 02/13/2020 1306   AST 30 02/13/2020 1306   AST 23 11/29/2019 0845   ALT 43 02/13/2020 1306   ALT 17 11/29/2019 0845   ALKPHOS 108 02/13/2020 1306   BILITOT 0.5 02/13/2020 1306   BILITOT 0.6 11/29/2019 0845   GFRNONAA 55 (L) 02/13/2020 1306   GFRNONAA 52 (L) 11/29/2019 0845   GFRAA >60 02/13/2020 1306   GFRAA >60 11/29/2019 0845    No results found for: TOTALPROTELP, ALBUMINELP, A1GS, A2GS, BETS, BETA2SER, GAMS, MSPIKE, SPEI  Lab Results  Component Value Date   WBC 7.6 02/13/2020   NEUTROABS 4.2 02/13/2020   HGB 11.6 (L) 02/13/2020   HCT 34.7 (L) 02/13/2020   MCV 90.1 02/13/2020   PLT 343 02/13/2020    No results found for: LABCA2  No components found for: [id number]  No results for input(s): INR in the last 168 hours.  No results found for: LABCA2  No results found for: LZJ673  No results found for: ALP379  No results found for: KWI097  Lab Results  Component Value Date     CA2729 10.4 02/13/2020    No components found for: HGQUANT  No results found for: CEA1 / No results found for:  CEA1   No results found for: AFPTUMOR  No results found for: CHROMOGRNA  No results found for: KPAFRELGTCHN, LAMBDASER, KAPLAMBRATIO (kappa/lambda light chains)  No results found for: HGBA, HGBA2QUANT, HGBFQUANT, HGBSQUAN (Hemoglobinopathy evaluation)   Lab Results  Component Value Date   LDH 230 (H) 06/28/2019    No results found for: IRON, TIBC, IRONPCTSAT (Iron and TIBC)  Lab Results  Component Value Date   FERRITIN 626 (H) 07/03/2019    Urinalysis    Component Value Date/Time   COLORURINE YELLOW 06/11/2013 2007   APPEARANCEUR CLEAR 06/11/2013 2007   LABSPEC 1.017 06/11/2013 2007   PHURINE 6.5 06/11/2013 2007   GLUCOSEU NEGATIVE 06/11/2013 2007   HGBUR NEGATIVE 06/11/2013 2007   BILIRUBINUR NEGATIVE 06/11/2013 2007   [place] NEGATIVE 06/11/2013 2007   PROTEINUR NEGATIVE 06/11/2013 2007   UROBILINOGEN 0.2 06/11/2013 2007   NITRITE NEGATIVE 06/11/2013 2007   LEUKOCYTESUR NEGATIVE 06/11/2013 2007     STUDIES: CT Chest W Contrast  Result Date: 01/25/2020 CLINICAL DATA:  Breast cancer, locally advanced breast cancer evaluate for metastases. EXAM: CT CHEST WITH CONTRAST TECHNIQUE: Multidetector CT imaging of the chest was performed during intravenous contrast administration. CONTRAST:  4m OMNIPAQUE IOHEXOL 300 MG/ML  SOLN COMPARISON:  Chest x-rays from 2021, MRI abdomen from April 29, 2018 FINDINGS: Cardiovascular: Calcific and noncalcific atheromatous plaque of the thoracic aorta. Mild tortuosity. No aneurysmal dilation of the thoracic aorta. Heart size mildly enlarged without pericardial effusion. Central pulmonary vasculature on venous phase assessment is unremarkable. There is increased density along the posterior margin of the RIGHT atrium and there is RIGHT heart enlargement in this patient with history of prior ASD repair. Mediastinum/Nodes:  No thoracic inlet adenopathy. No subpectoral adenopathy. No internal mammary adenopathy. No mediastinal adenopathy. No hilar adenopathy. Scattered small lymph nodes in the RIGHT axilla with fatty hila. Small lymph node adjacent to LEFT axillary lymph node dissection changes measures 8 mm (image 48, series 2) Lungs/Pleura: No consolidation. No pleural effusion. 5 x 4 mm LEFT lower lobe pulmonary nodule (image 87, series 5) Upper Abdomen: Incidental imaging of upper abdominal contents with partial imaging of previously characterized LEFT renal cyst arising from anterior LEFT kidney. No acute upper abdominal process. Musculoskeletal: Postoperative changes in the LEFT axilla with area of fluid density adjacent to surgical drain measuring 3.6 x 3.1 cm without peripheral enhancement. Six Hounsfield unit density. Mild pleural irregularity along the RIGHT chest associated with some calcification and mild deformity of the RIGHT posterolateral fourth rib presumably associated with prior thoracotomy. Spinal degenerative changes. No acute or destructive bone process. IMPRESSION: 1. Postoperative changes in the LEFT axilla with area of fluid density adjacent to surgical drain measuring 3.6 x 3.1 cm without peripheral enhancement. Likely postoperative seroma. Drain is at the posterior margin of this collection potentially outside of this area. Small lymph node adjacent to postoperative changes suspected and described on image 48 of series 2, less than a cm. Attention on follow-up. 2. 5 x 4 mm LEFT lower lobe pulmonary nodule. Nonspecific, short interval follow-up may be helpful. 3. Mild pleural irregularity along the RIGHT chest associated with some calcification and mild deformity of the RIGHT posterolateral fourth rib presumably associated with prior thoracotomy in this patient with history of prior ASD repair. 4. Aortic atherosclerosis. Aortic Atherosclerosis (ICD10-I70.0). Electronically Signed   By: GZetta BillsM.D.   On:  01/25/2020 15:36   NM Bone Scan Whole Body  Result Date: 01/27/2020 CLINICAL DATA:  Left breast cancer status post left lumpectomy  01/12/2020, staging evaluation EXAM: NUCLEAR MEDICINE WHOLE BODY BONE SCAN TECHNIQUE: Whole body anterior and posterior images were obtained approximately 3 hours after intravenous injection of radiopharmaceutical. RADIOPHARMACEUTICALS:  21.1 mCi Technetium-60mMDP IV COMPARISON:  01/25/2020 FINDINGS: Anterior and posterior whole body planar images are obtained after radiotracer administration. There is physiologic excretion of radiotracer within the kidneys and bladder. Mild low level activity within the lower thoracic spine and lower lumbar spine consistent with spondylosis and facet hypertrophy. Mild degenerative type activity also seen within the bilateral patellofemoral joints and left ankle/foot. There are no focal areas of radiotracer activity to suggest an acute or destructive process. Specifically, no evidence of bony metastases. IMPRESSION: 1. Multifocal degenerative changes, no evidence of bony metastatic disease. Electronically Signed   By: MRanda NgoM.D.   On: 01/27/2020 20:13   DG Chest Port 1 View  Result Date: 02/02/2020 CLINICAL DATA:  Port placement. EXAM: PORTABLE CHEST 1 VIEW COMPARISON:  CT chest dated January 25, 2020. Chest x-ray dated November 15, 2019. FINDINGS: New right chest wall port catheter with the tip at the cavoatrial junction. The entire catheter tubing is not included in the field of view. The heart size and mediastinal contours are within normal limits. Both lungs are clear. The visualized skeletal structures are unremarkable. IMPRESSION: 1. New right chest wall port catheter without definite complicating feature. Please note that th in e entire catheter tubing is not included in the field of view. Electronically Signed   By: WTitus DubinM.D.   On: 02/02/2020 15:00   DG Fluoro Guide CV Line-No Report  Result Date: 02/02/2020 Fluoroscopy  was utilized by the requesting physician.  No radiographic interpretation.   ECHOCARDIOGRAM COMPLETE  Result Date: 02/08/2020    ECHOCARDIOGRAM REPORT   Patient Name:   CCONSUELLA SCURLOCKDate of Exam: 02/08/2020 Medical Rec #:  [id number]     Height:       59.0 in Accession #:    [id number]    Weight:       159.6 lb Date of Birth:  105-Mar-1947    BSA:          1.676 m Patient Age:    89years       BP:           150/78 mmHg Patient Gender: F              HR:           94 bpm. Exam Location:  Outpatient Procedure: 2D Echo, Color Doppler, Cardiac Doppler and Strain Analysis Indications:    Chemo Evaluation  History:        Patient has no prior history of Echocardiogram examinations.                 Risk Factors:Hypertension and Breast Cancer. ASD repaired with                 mesh at age 89  Sonographer:    ERaquel SarnaSenior RDCS Referring Phys: 8[place] 1. Left ventricular ejection fraction, by estimation, is 65 to 70%. The left ventricle has normal function. The left ventricle has no regional wall motion abnormalities. Left ventricular diastolic parameters were normal. The average left ventricular global longitudinal strain is -19.7 %. The global longitudinal strain is normal.  2. Right ventricular systolic function is normal. The right ventricular size is normal. There is normal pulmonary artery systolic pressure. The estimated right ventricular systolic pressure  is 26.8 mmHg.  3. Left atrial size was mildly dilated.  4. The mitral valve is degenerative. Trivial mitral valve regurgitation. No evidence of mitral stenosis.  5. The aortic valve is tricuspid. Aortic valve regurgitation is not visualized. Mild aortic valve sclerosis is present, with no evidence of aortic valve stenosis.  6. The inferior vena cava is normal in size with greater than 50% respiratory variability, suggesting right atrial pressure of 3 mmHg. FINDINGS  Left Ventricle: Left ventricular ejection fraction, by estimation,  is 65 to 70%. The left ventricle has normal function. The left ventricle has no regional wall motion abnormalities. The average left ventricular global longitudinal strain is -19.7 %. The global longitudinal strain is normal. The left ventricular internal cavity size was normal in size. There is no left ventricular hypertrophy. Left ventricular diastolic parameters were normal. Right Ventricle: The right ventricular size is normal. No increase in right ventricular wall thickness. Right ventricular systolic function is normal. There is normal pulmonary artery systolic pressure. The tricuspid regurgitant velocity is 2.44 m/s, and  with an assumed right atrial pressure of 3 mmHg, the estimated right ventricular systolic pressure is 44.0 mmHg. Left Atrium: Left atrial size was mildly dilated. Right Atrium: Right atrial size was normal in size. Pericardium: Trivial pericardial effusion is present. Mitral Valve: The mitral valve is degenerative in appearance. Mild mitral annular calcification. Trivial mitral valve regurgitation. No evidence of mitral valve stenosis. Tricuspid Valve: The tricuspid valve is grossly normal. Tricuspid valve regurgitation is mild . No evidence of tricuspid stenosis. Aortic Valve: The aortic valve is tricuspid. . There is mild thickening and mild calcification of the aortic valve. Aortic valve regurgitation is not visualized. Mild aortic valve sclerosis is present, with no evidence of aortic valve stenosis. There is mild thickening of the aortic valve. There is mild calcification of the aortic valve. Pulmonic Valve: The pulmonic valve was grossly normal. Pulmonic valve regurgitation is not visualized. No evidence of pulmonic stenosis. Aorta: The aortic root and ascending aorta are structurally normal, with no evidence of dilitation. Venous: The inferior vena cava is normal in size with greater than 50% respiratory variability, suggesting right atrial pressure of 3 mmHg. IAS/Shunts: The atrial  septum is grossly normal.  LEFT VENTRICLE PLAX 2D LVIDd:         4.70 cm  Diastology LVIDs:         3.20 cm  LV e' lateral:   5.55 cm/s LV PW:         1.10 cm  LV E/e' lateral: 11.9 LV IVS:        1.20 cm  LV e' medial:    4.79 cm/s LVOT diam:     2.00 cm  LV E/e' medial:  13.8 LV SV:         61 LV SV Index:   36       2D Longitudinal Strain LVOT Area:     3.14 cm 2D Strain GLS Avg:     -19.7 %  RIGHT VENTRICLE RV S prime:     11.70 cm/s TAPSE (M-mode): 2.4 cm LEFT ATRIUM             Index       RIGHT ATRIUM           Index LA diam:        3.40 cm 2.03 cm/m  RA Area:     13.70 cm LA Vol (A2C):   70.5 ml 42.07 ml/m RA Volume:   28.00  ml  16.71 ml/m LA Vol (A4C):   43.8 ml 26.14 ml/m LA Biplane Vol: 57.0 ml 34.02 ml/m  AORTIC VALVE LVOT Vmax:   80.90 cm/s LVOT Vmean:  53.700 cm/s LVOT VTI:    0.194 m  AORTA Ao Root diam: 3.10 cm Ao Asc diam:  3.70 cm MITRAL VALVE               TRICUSPID VALVE MV Area (PHT): 2.73 cm    TR Peak grad:   23.8 mmHg MV Decel Time: 278 msec    TR Vmax:        244.00 cm/s MV E velocity: 66.00 cm/s MV A velocity: 73.70 cm/s  SHUNTS MV E/A ratio:  0.90        Systemic VTI:  0.19 m                            Systemic Diam: 2.00 cm Eleonore Chiquito MD Electronically signed by Eleonore Chiquito MD Signature Date/Time: 02/08/2020/8:05:26 PM    Final      ELIGIBLE FOR AVAILABLE RESEARCH PROTOCOL: AET  ASSESSMENT: 74 y.o. [place] woman status post left breast upper outer quadrant biopsy 11/17/2019 for a clinical T2 N1, stage IIA invasive ductal carcinoma, grade 2, estrogen and progesterone receptor positive, HER-2 not amplified, with an MIB-1 of 30%.  (1) status post left lumpectomy and axillary lymph node dissection 01/12/2020 for a pT2 pN2, stage IIIA invasive ductal carcinoma, grade 3, with negative margins  (a) a total of 14 axillary lymph nodes removed, 8 positive, with extracapsular extension  (2) adjuvant chemotherapy will consist of doxorubicin and cyclophosphamide in dose dense  fashion x4 starting 02/13/2020, followed by weekly paclitaxel x12  (a) echocardiogram 02/08/2020  (3) adjuvant radiation to follow  (4) antiestrogens to start at the completion of local treatment   PLAN: Tiearra did remarkably well with her first dose of chemotherapy.  Hopefully she will do equally well with her first dose of Neulasta.  However it is not uncommon to have significant pain from this so we discussed that at length today.  She understands that the first line of control for pain is to take Tylenol 500 mg together with Aleve 220 mg.  She can repeat that up to 3 times a day very safely.  If that is not enough she can use tramadol and I went ahead and wrote her a tramadol prescription just as she would have it in case the problem occurs over the weekend where he would be more difficult for her to get pain medication  She will return in a week for nadir counts and to see how she did overall and at that point we will decide further to try to do this dose dense every 2 weeks, or go to every 3 weeks with her treatments  She knows to call for any other issues.  Total encounter time 20 minutes.Cynthia Vasquez C. Latham Kinzler, MD 02/15/2020 2:52 PM Medical Oncology and Hematology [facility] [place], [place] [address] Tel. [phone]    Fax. [phone]   I, Jacqualyn Posey am acting as a Education administrator for Chauncey Cruel, MD.   I, Lurline Del MD, have reviewed the above documentation for accuracy and completeness, and I agree with the above.    *Total Encounter Time as defined by the Centers for Medicare and Medicaid Services includes, in addition to the face-to-face time of a patient visit (documented in the  note above) non-face-to-face time: obtaining and reviewing outside history, ordering and reviewing medications, tests or procedures, care coordination (communications with other health care professionals or caregivers) and documentation in the medical record.

## 2020-02-17 ENCOUNTER — Ambulatory Visit: Payer: Medicare HMO

## 2020-02-20 ENCOUNTER — Inpatient Hospital Stay: Payer: Medicare HMO | Admitting: Oncology

## 2020-02-20 ENCOUNTER — Inpatient Hospital Stay: Payer: Medicare HMO

## 2020-02-20 ENCOUNTER — Encounter: Payer: Self-pay | Admitting: *Deleted

## 2020-02-20 ENCOUNTER — Other Ambulatory Visit: Payer: Self-pay

## 2020-02-20 VITALS — BP 130/45 | HR 73 | Temp 98.4°F | Resp 18 | Ht 59.0 in | Wt 161.0 lb

## 2020-02-20 DIAGNOSIS — Z5189 Encounter for other specified aftercare: Secondary | ICD-10-CM | POA: Diagnosis not present

## 2020-02-20 DIAGNOSIS — Z95828 Presence of other vascular implants and grafts: Secondary | ICD-10-CM

## 2020-02-20 DIAGNOSIS — R5383 Other fatigue: Secondary | ICD-10-CM | POA: Diagnosis not present

## 2020-02-20 DIAGNOSIS — I158 Other secondary hypertension: Secondary | ICD-10-CM | POA: Diagnosis not present

## 2020-02-20 DIAGNOSIS — R197 Diarrhea, unspecified: Secondary | ICD-10-CM | POA: Diagnosis not present

## 2020-02-20 DIAGNOSIS — C50412 Malignant neoplasm of upper-outer quadrant of left female breast: Secondary | ICD-10-CM | POA: Diagnosis not present

## 2020-02-20 DIAGNOSIS — Z170 Estrogen receptor positive status [ER+]: Secondary | ICD-10-CM | POA: Diagnosis not present

## 2020-02-20 DIAGNOSIS — R911 Solitary pulmonary nodule: Secondary | ICD-10-CM | POA: Diagnosis not present

## 2020-02-20 DIAGNOSIS — R110 Nausea: Secondary | ICD-10-CM | POA: Diagnosis not present

## 2020-02-20 DIAGNOSIS — C773 Secondary and unspecified malignant neoplasm of axilla and upper limb lymph nodes: Secondary | ICD-10-CM | POA: Diagnosis not present

## 2020-02-20 DIAGNOSIS — Z5111 Encounter for antineoplastic chemotherapy: Secondary | ICD-10-CM | POA: Diagnosis not present

## 2020-02-20 LAB — CBC WITH DIFFERENTIAL/PLATELET
Abs Immature Granulocytes: 0 10*3/uL (ref 0.00–0.07)
Basophils Absolute: 0 10*3/uL (ref 0.0–0.1)
Basophils Relative: 1 %
Eosinophils Absolute: 0.1 10*3/uL (ref 0.0–0.5)
Eosinophils Relative: 5 %
HCT: 31.4 % — ABNORMAL LOW (ref 36.0–46.0)
Hemoglobin: 10.6 g/dL — ABNORMAL LOW (ref 12.0–15.0)
Immature Granulocytes: 0 %
Lymphocytes Relative: 70 %
Lymphs Abs: 0.8 10*3/uL (ref 0.7–4.0)
MCH: 30.3 pg (ref 26.0–34.0)
MCHC: 33.8 g/dL (ref 30.0–36.0)
MCV: 89.7 fL (ref 80.0–100.0)
Monocytes Absolute: 0.1 10*3/uL (ref 0.1–1.0)
Monocytes Relative: 13 %
Neutro Abs: 0.1 10*3/uL — CL (ref 1.7–7.7)
Neutrophils Relative %: 11 %
Platelets: 153 10*3/uL (ref 150–400)
RBC: 3.5 MIL/uL — ABNORMAL LOW (ref 3.87–5.11)
RDW: 13.7 % (ref 11.5–15.5)
WBC: 1.1 10*3/uL — ABNORMAL LOW (ref 4.0–10.5)
nRBC: 0 % (ref 0.0–0.2)

## 2020-02-20 LAB — COMPREHENSIVE METABOLIC PANEL
ALT: 16 U/L (ref 0–44)
AST: 11 U/L — ABNORMAL LOW (ref 15–41)
Albumin: 3.5 g/dL (ref 3.5–5.0)
Alkaline Phosphatase: 79 U/L (ref 38–126)
Anion gap: 9 (ref 5–15)
BUN: 29 mg/dL — ABNORMAL HIGH (ref 8–23)
CO2: 24 mmol/L (ref 22–32)
Calcium: 9.4 mg/dL (ref 8.9–10.3)
Chloride: 104 mmol/L (ref 98–111)
Creatinine, Ser: 1.09 mg/dL — ABNORMAL HIGH (ref 0.44–1.00)
GFR calc Af Amer: 58 mL/min — ABNORMAL LOW (ref >60)
GFR calc non Af Amer: 50 mL/min — ABNORMAL LOW (ref >60)
Glucose, Bld: 122 mg/dL — ABNORMAL HIGH (ref 70–99)
Potassium: 4.2 mmol/L (ref 3.5–5.1)
Sodium: 137 mmol/L (ref 135–145)
Total Bilirubin: 0.6 mg/dL (ref 0.3–1.2)
Total Protein: 6.5 g/dL (ref 6.5–8.1)

## 2020-02-20 MED ORDER — CIPROFLOXACIN HCL 500 MG PO TABS
500.0000 mg | ORAL_TABLET | Freq: Two times a day (BID) | ORAL | 0 refills | Status: DC
Start: 2020-02-20 — End: 2020-03-20

## 2020-02-20 MED ORDER — SODIUM CHLORIDE 0.9% FLUSH
10.0000 mL | Freq: Once | INTRAVENOUS | Status: AC
  Administered 2020-02-20: 10 mL
  Filled 2020-02-20: qty 10

## 2020-02-20 MED ORDER — HEPARIN SOD (PORK) LOCK FLUSH 100 UNIT/ML IV SOLN
500.0000 [IU] | Freq: Once | INTRAVENOUS | Status: AC
  Administered 2020-02-20: 500 [IU]
  Filled 2020-02-20: qty 5

## 2020-02-20 NOTE — Progress Notes (Signed)
[place]  [phone] [phone]     ID: Cynthia Vasquez DOB: [birth year]  MR#: [id number]  XHF#:[id number]  Patient Care Team: Sueanne Margarita, DO as PCP - General (Internal Medicine) Rockwell Germany, RN as Oncology Nurse Navigator Mauro Kaufmann, RN as Oncology Nurse Navigator Edsel Shives, Virgie Dad, MD as Consulting Physician (Oncology) Alphonsa Overall, MD as Consulting Physician (General Surgery) Kyung Rudd, MD as Consulting Physician (Radiation Oncology) Wilford Corner, MD as Consulting Physician (Gastroenterology) Chauncey Cruel, MD OTHER MD:  CHIEF COMPLAINT: Estrogen receptor positive breast cancer  CURRENT TREATMENT: adjuvant chemotherapy    INTERVAL HISTORY: Cynthia Vasquez returns today for follow-up and treatment of her estrogen receptor positive breast cancer.   She started adjuvant chemotherapy consisting of doxorubicin and cyclophosphamide on 02/13/2020. Today is day 8 cycle 1.   Her most recent echocardiogram from 02/08/2020 showed an ejection fraction of 65-70%.  Lab Results  Component Value Date   CA2729 10.4 02/13/2020    REVIEW OF SYSTEMS: Cynthia Vasquez did generally quite well with her first cycle.  She did have some nausea but no vomiting.  She had lower abdominal discomfort, almost in the pelvis.  She had some loose bowel movements for 5 times a day, not quite diarrhea, no constipation.  She has a bad taste in her mouth which she hates.  She was a little bit choky for a couple of days but that resolved.  She also had some facial swelling around day 4 or 5 likely from the steroids and that also resolved.  She had minimal bone aches, more like restless legs.  She did not have to take pain medicine for that.  She has had a little bit of blurred vision.  Also she had several questions and told me that she prefers a morning appointment if possible.   HISTORY OF CURRENT ILLNESS: From the original intake note:  Cynthia Vasquez herself palpated a left  breast mass, with associated dimpling, tenderness, and pinching pain. She underwent bilateral diagnostic mammography with tomography and left breast ultrasonography at [facility] on 11/09/2019 showing: breast density category C; 2.9 cm irregular mass in left breast at 2:30 corresponding to palpable abnormality; three enlarged left axillary lymph nodes.  Accordingly on 11/17/2019 she proceeded to biopsy of the left breast area in question. The pathology from this procedure ([id number]) showed: invasive mammary carcinoma, grade 2, e-cadherin positive. Prognostic indicators significant for: estrogen receptor, 95% positive and progesterone receptor, 95% positive, both with strong staining intensity. Proliferation marker Ki67 at 30%. HER2 negative by immunohistochemistry (1+).  The biopsied lymph node was positive for metastatic carcinoma.  The patient's subsequent history is as detailed below.   PAST MEDICAL HISTORY: Past Medical History:  Diagnosis Date  . Atrial septal defect    repaired age 25  . Cancer ([facility]) 01/2020   IDC left breast  . GERD (gastroesophageal reflux disease)   . Glaucoma   . Headache    migraines until age 21- 27, have them 3-4 x year  . History of blood transfusion    with ASD surgery at age 63  . History of kidney stones    passed stones  . Hypertension   . Vitamin D deficiency     PAST SURGICAL HISTORY: Past Surgical History:  Procedure Laterality Date  . ASD REPAIR     age 23  . BREAST LUMPECTOMY WITH RADIOACTIVE SEED AND AXILLARY LYMPH NODE DISSECTION Left 01/12/2020   Procedure: LEFT BREAST LUMPECTOMY WITH RADIOACTIVE SEED AND LEFT  AXILLARY LYMPH NODE DISSECTION;  Surgeon: Alphonsa Overall, MD;  Location: [facility];  Service: General;  Laterality: Left;  PEC BLOCK  . COLONOSCOPY    . PORTACATH PLACEMENT Right 02/02/2020   Procedure: INSERTION PORT-A-CATH WITH ULTRASOUND GUIDANCE;  Surgeon: Alphonsa Overall, MD;  Location: [facility];  Service:  General;  Laterality: Right;  . UPPER GI ENDOSCOPY      FAMILY HISTORY: Family History  Problem Relation Age of Onset  . Dementia Mother   . Heart attack Father   . Hypertension Father   . Breast cancer Sister 61  The patient's father died at age 93 from a myocardial infarction.  The patient's mother died at 14 with Alzheimer's disease.  The patient has 2 brothers, 2 sisters.  1 sister developed breast cancer at the age of 39.  The patient's daughter has a history of thyroid cancer.  She has not been genetically tested.  There is no other history of cancer in the family to the patient's knowledge.   GYNECOLOGIC HISTORY:  No LMP recorded (lmp unknown). Patient is postmenopausal. Menarche: 74 years old Age at first live birth: 74 years old [place] P 3 LMP age 60 Contraceptive approximately 2 years, with no complications HRT no  Hysterectomy?  No BSO?  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  She is now retired.  Her husband [name] (goes by [employer]") is also a retired Optometrist.  Son [name] 66 lives in [place] and works for a company that processes [employer] for [employer], but he completed a masters in Chief Executive Officer and is looking for a different job; daughter [name] lives in [place] and is a Oncologist; son [name] lives in [place] and runs a business that puts and tennis on cell towers.  The patient has 8 grandchildren.  She attends our [place] [employer].    ADVANCED DIRECTIVES: In the absence of any documents to the contrary the patient's husband is her healthcare power of attorney   HEALTH MAINTENANCE: Social History   Tobacco Use  . Smoking status: Never Smoker  . Smokeless tobacco: Never Used  Vaping Use  . Vaping Use: Never used  Substance Use Topics  . Alcohol use: No  . Drug use: No     Colonoscopy: "Less than 10 years ago"; [facility]  PAP: Remote  Bone density: Never   No Known  Allergies  Current Outpatient Medications  Medication Sig Dispense Refill  . acetaminophen (TYLENOL) 500 MG tablet Take 500 mg by mouth every 6 (six) hours as needed for mild pain.     Marland Kitchen acetaminophen (TYLENOL) 500 MG tablet Take 1 tablet (500 mg total) by mouth every 8 (eight) hours as needed. Take with aleve 220 md 60 tablet 0  . b complex vitamins tablet Take 1 tablet by mouth 3 (three) times a week.    . calcium gluconate 500 MG tablet Take 1 tablet (500 mg total) by mouth 3 (three) times daily.    . cholecalciferol (VITAMIN D) 1000 UNITS tablet Take 1,000 Units by mouth daily.    . ciprofloxacin (CIPRO) 500 MG tablet Take 1 tablet (500 mg total) by mouth 2 (two) times daily. Start day 8 after chemotherapy, take one tablet twice a day for five days then stop. Repeat next chemo cycle. 40 tablet 0  . dexamethasone (DECADRON) 4 MG tablet Counting chemo day as day 1, take 2 tablets by mouth twice a day  on days 2 and 3 then in AM day 4, then stop 30 tablet 1  . hydrochlorothiazide (HYDRODIURIL) 25 MG tablet Take 25 mg by mouth daily.     Marland Kitchen latanoprost (XALATAN) 0.005 % ophthalmic solution Place 1 drop into both eyes at bedtime.    . lidocaine-prilocaine (EMLA) cream Apply to affected area once 30 g 3  . LORazepam (ATIVAN) 0.5 MG tablet Take 1 tablet (0.5 mg total) by mouth at bedtime as needed (Nausea or vomiting). 20 tablet 0  . losartan (COZAAR) 50 MG tablet Take 50 mg by mouth daily.     . Multiple Vitamins-Minerals (MULTIVITAMIN WITH MINERALS) tablet Take 1 tablet by mouth daily.    . naproxen sodium (ALEVE) 220 MG tablet Take 1 tablet (220 mg total) by mouth every 8 (eight) hours as needed. Take with tylenol 500 mg. 60 tablet 3  . pantoprazole (PROTONIX) 40 MG tablet Take 40 mg by mouth every other day.     . prochlorperazine (COMPAZINE) 10 MG tablet Take 1 tablet (10 mg total) by mouth 4 (four) times daily -  before meals and at bedtime. Star the evening of chemotherapy and continue for 3  days 30 tablet 1  . traMADol (ULTRAM) 50 MG tablet Take 1-2 tablets (50-100 mg total) by mouth every 6 (six) hours as needed. 60 tablet 0   No current facility-administered medications for this visit.    OBJECTIVE: White woman who appears well  Vitals:   02/20/20 1448  BP: (!) 130/45  Pulse: 73  Resp: 18  Temp: 98.4 F (36.9 C)  SpO2: 97%   Wt Readings from Last 3 Encounters:  02/20/20 161 lb (73 kg)  02/15/20 165 lb 9.6 oz (75.1 kg)  02/08/20 159 lb 9.6 oz (72.4 kg)   Body mass index is 32.52 kg/m.    ECOG FS:1 - Symptomatic but completely ambulatory  Sclerae unicteric, EOMs intact Oropharynx is clear, no thrush No cervical or supraclavicular adenopathy Lungs no rales or rhonchi Heart regular rate and rhythm Abd soft, nontender, positive bowel sounds MSK no focal spinal tenderness, no upper extremity lymphedema Neuro: nonfocal, well oriented, appropriate affect Breasts: Deferred   LAB RESULTS:  CMP     Component Value Date/Time   NA 137 02/20/2020 1438   K 4.2 02/20/2020 1438   CL 104 02/20/2020 1438   CO2 24 02/20/2020 1438   GLUCOSE 122 (H) 02/20/2020 1438   BUN 29 (H) 02/20/2020 1438   CREATININE 1.09 (H) 02/20/2020 1438   CREATININE 1.06 (H) 11/29/2019 0845   CALCIUM 9.4 02/20/2020 1438   PROT 6.5 02/20/2020 1438   ALBUMIN 3.5 02/20/2020 1438   AST 11 (L) 02/20/2020 1438   AST 23 11/29/2019 0845   ALT 16 02/20/2020 1438   ALT 17 11/29/2019 0845   ALKPHOS 79 02/20/2020 1438   BILITOT 0.6 02/20/2020 1438   BILITOT 0.6 11/29/2019 0845   GFRNONAA 50 (L) 02/20/2020 1438   GFRNONAA 52 (L) 11/29/2019 0845   GFRAA 58 (L) 02/20/2020 1438   GFRAA >60 11/29/2019 0845    No results found for: TOTALPROTELP, ALBUMINELP, A1GS, A2GS, BETS, BETA2SER, GAMS, MSPIKE, SPEI  Lab Results  Component Value Date   WBC 1.1 (L) 02/20/2020   NEUTROABS 0.1 (LL) 02/20/2020   HGB 10.6 (L) 02/20/2020   HCT 31.4 (L) 02/20/2020   MCV 89.7 02/20/2020   PLT 153 02/20/2020      No results found for: LABCA2  No components found for: [id number]  No results for  input(s): INR in the last 168 hours.  No results found for: LABCA2  No results found for: HAL937  No results found for: TKW409  No results found for: BDZ329  Lab Results  Component Value Date   CA2729 10.4 02/13/2020    No components found for: HGQUANT  No results found for: CEA1 / No results found for: CEA1   No results found for: AFPTUMOR  No results found for: CHROMOGRNA  No results found for: KPAFRELGTCHN, LAMBDASER, KAPLAMBRATIO (kappa/lambda light chains)  No results found for: HGBA, HGBA2QUANT, HGBFQUANT, HGBSQUAN (Hemoglobinopathy evaluation)   Lab Results  Component Value Date   LDH 230 (H) 06/28/2019    No results found for: IRON, TIBC, IRONPCTSAT (Iron and TIBC)  Lab Results  Component Value Date   FERRITIN 626 (H) 07/03/2019    Urinalysis    Component Value Date/Time   COLORURINE YELLOW 06/11/2013 2007   APPEARANCEUR CLEAR 06/11/2013 2007   LABSPEC 1.017 06/11/2013 2007   PHURINE 6.5 06/11/2013 2007   GLUCOSEU NEGATIVE 06/11/2013 2007   HGBUR NEGATIVE 06/11/2013 2007   [place] NEGATIVE 06/11/2013 2007   [place] NEGATIVE 06/11/2013 2007   PROTEINUR NEGATIVE 06/11/2013 2007   UROBILINOGEN 0.2 06/11/2013 2007   NITRITE NEGATIVE 06/11/2013 2007   LEUKOCYTESUR NEGATIVE 06/11/2013 2007     STUDIES: CT Chest W Contrast  Result Date: 01/25/2020 CLINICAL DATA:  Breast cancer, locally advanced breast cancer evaluate for metastases. EXAM: CT CHEST WITH CONTRAST TECHNIQUE: Multidetector CT imaging of the chest was performed during intravenous contrast administration. CONTRAST:  94m OMNIPAQUE IOHEXOL 300 MG/ML  SOLN COMPARISON:  Chest x-rays from 2021, MRI abdomen from April 29, 2018 FINDINGS: Cardiovascular: Calcific and noncalcific atheromatous plaque of the thoracic aorta. Mild tortuosity. No aneurysmal dilation of the thoracic aorta. Heart size mildly  enlarged without pericardial effusion. Central pulmonary vasculature on venous phase assessment is unremarkable. There is increased density along the posterior margin of the RIGHT atrium and there is RIGHT heart enlargement in this patient with history of prior ASD repair. Mediastinum/Nodes: No thoracic inlet adenopathy. No subpectoral adenopathy. No internal mammary adenopathy. No mediastinal adenopathy. No hilar adenopathy. Scattered small lymph nodes in the RIGHT axilla with fatty hila. Small lymph node adjacent to LEFT axillary lymph node dissection changes measures 8 mm (image 48, series 2) Lungs/Pleura: No consolidation. No pleural effusion. 5 x 4 mm LEFT lower lobe pulmonary nodule (image 87, series 5) Upper Abdomen: Incidental imaging of upper abdominal contents with partial imaging of previously characterized LEFT renal cyst arising from anterior LEFT kidney. No acute upper abdominal process. Musculoskeletal: Postoperative changes in the LEFT axilla with area of fluid density adjacent to surgical drain measuring 3.6 x 3.1 cm without peripheral enhancement. Six Hounsfield unit density. Mild pleural irregularity along the RIGHT chest associated with some calcification and mild deformity of the RIGHT posterolateral fourth rib presumably associated with prior thoracotomy. Spinal degenerative changes. No acute or destructive bone process. IMPRESSION: 1. Postoperative changes in the LEFT axilla with area of fluid density adjacent to surgical drain measuring 3.6 x 3.1 cm without peripheral enhancement. Likely postoperative seroma. Drain is at the posterior margin of this collection potentially outside of this area. Small lymph node adjacent to postoperative changes suspected and described on image 48 of series 2, less than a cm. Attention on follow-up. 2. 5 x 4 mm LEFT lower lobe pulmonary nodule. Nonspecific, short interval follow-up may be helpful. 3. Mild pleural irregularity along the RIGHT chest associated  with some calcification and mild  deformity of the RIGHT posterolateral fourth rib presumably associated with prior thoracotomy in this patient with history of prior ASD repair. 4. Aortic atherosclerosis. Aortic Atherosclerosis (ICD10-I70.0). Electronically Signed   By: Zetta Bills M.D.   On: 01/25/2020 15:36   NM Bone Scan Whole Body  Result Date: 01/27/2020 CLINICAL DATA:  Left breast cancer status post left lumpectomy 01/12/2020, staging evaluation EXAM: NUCLEAR MEDICINE WHOLE BODY BONE SCAN TECHNIQUE: Whole body anterior and posterior images were obtained approximately 3 hours after intravenous injection of radiopharmaceutical. RADIOPHARMACEUTICALS:  21.1 mCi Technetium-14mMDP IV COMPARISON:  01/25/2020 FINDINGS: Anterior and posterior whole body planar images are obtained after radiotracer administration. There is physiologic excretion of radiotracer within the kidneys and bladder. Mild low level activity within the lower thoracic spine and lower lumbar spine consistent with spondylosis and facet hypertrophy. Mild degenerative type activity also seen within the bilateral patellofemoral joints and left ankle/foot. There are no focal areas of radiotracer activity to suggest an acute or destructive process. Specifically, no evidence of bony metastases. IMPRESSION: 1. Multifocal degenerative changes, no evidence of bony metastatic disease. Electronically Signed   By: MRanda NgoM.D.   On: 01/27/2020 20:13   DG Chest Port 1 View  Result Date: 02/02/2020 CLINICAL DATA:  Port placement. EXAM: PORTABLE CHEST 1 VIEW COMPARISON:  CT chest dated January 25, 2020. Chest x-ray dated November 15, 2019. FINDINGS: New right chest wall port catheter with the tip at the cavoatrial junction. The entire catheter tubing is not included in the field of view. The heart size and mediastinal contours are within normal limits. Both lungs are clear. The visualized skeletal structures are unremarkable. IMPRESSION: 1. New right  chest wall port catheter without definite complicating feature. Please note that th in e entire catheter tubing is not included in the field of view. Electronically Signed   By: WTitus DubinM.D.   On: 02/02/2020 15:00   DG Fluoro Guide CV Line-No Report  Result Date: 02/02/2020 Fluoroscopy was utilized by the requesting physician.  No radiographic interpretation.   ECHOCARDIOGRAM COMPLETE  Result Date: 02/08/2020    ECHOCARDIOGRAM REPORT   Patient Name:   CMARTHANN ABSHIERDate of Exam: 02/08/2020 Medical Rec #:  [id number]     Height:       59.0 in Accession #:    [id number]    Weight:       159.6 lb Date of Birth:  101-Sep-1947    BSA:          1.676 m Patient Age:    [age]years       BP:           150/78 mmHg Patient Gender: F              HR:           94 bpm. Exam Location:  Outpatient Procedure: 2D Echo, Color Doppler, Cardiac Doppler and Strain Analysis Indications:    Chemo Evaluation  History:        Patient has no prior history of Echocardiogram examinations.                 Risk Factors:Hypertension and Breast Cancer. ASD repaired with                 mesh at age 74  Sonographer:    ERaquel SarnaSenior RDCS Referring Phys: 8[place] 1. Left ventricular ejection fraction, by estimation, is 65 to 70%. The left  ventricle has normal function. The left ventricle has no regional wall motion abnormalities. Left ventricular diastolic parameters were normal. The average left ventricular global longitudinal strain is -19.7 %. The global longitudinal strain is normal.  2. Right ventricular systolic function is normal. The right ventricular size is normal. There is normal pulmonary artery systolic pressure. The estimated right ventricular systolic pressure is 19.4 mmHg.  3. Left atrial size was mildly dilated.  4. The mitral valve is degenerative. Trivial mitral valve regurgitation. No evidence of mitral stenosis.  5. The aortic valve is tricuspid. Aortic valve regurgitation is not visualized.  Mild aortic valve sclerosis is present, with no evidence of aortic valve stenosis.  6. The inferior vena cava is normal in size with greater than 50% respiratory variability, suggesting right atrial pressure of 3 mmHg. FINDINGS  Left Ventricle: Left ventricular ejection fraction, by estimation, is 65 to 70%. The left ventricle has normal function. The left ventricle has no regional wall motion abnormalities. The average left ventricular global longitudinal strain is -19.7 %. The global longitudinal strain is normal. The left ventricular internal cavity size was normal in size. There is no left ventricular hypertrophy. Left ventricular diastolic parameters were normal. Right Ventricle: The right ventricular size is normal. No increase in right ventricular wall thickness. Right ventricular systolic function is normal. There is normal pulmonary artery systolic pressure. The tricuspid regurgitant velocity is 2.44 m/s, and  with an assumed right atrial pressure of 3 mmHg, the estimated right ventricular systolic pressure is 17.4 mmHg. Left Atrium: Left atrial size was mildly dilated. Right Atrium: Right atrial size was normal in size. Pericardium: Trivial pericardial effusion is present. Mitral Valve: The mitral valve is degenerative in appearance. Mild mitral annular calcification. Trivial mitral valve regurgitation. No evidence of mitral valve stenosis. Tricuspid Valve: The tricuspid valve is grossly normal. Tricuspid valve regurgitation is mild . No evidence of tricuspid stenosis. Aortic Valve: The aortic valve is tricuspid. . There is mild thickening and mild calcification of the aortic valve. Aortic valve regurgitation is not visualized. Mild aortic valve sclerosis is present, with no evidence of aortic valve stenosis. There is mild thickening of the aortic valve. There is mild calcification of the aortic valve. Pulmonic Valve: The pulmonic valve was grossly normal. Pulmonic valve regurgitation is not visualized. No  evidence of pulmonic stenosis. Aorta: The aortic root and ascending aorta are structurally normal, with no evidence of dilitation. Venous: The inferior vena cava is normal in size with greater than 50% respiratory variability, suggesting right atrial pressure of 3 mmHg. IAS/Shunts: The atrial septum is grossly normal.  LEFT VENTRICLE PLAX 2D LVIDd:         4.70 cm  Diastology LVIDs:         3.20 cm  LV e' lateral:   5.55 cm/s LV PW:         1.10 cm  LV E/e' lateral: 11.9 LV IVS:        1.20 cm  LV e' medial:    4.79 cm/s LVOT diam:     2.00 cm  LV E/e' medial:  13.8 LV SV:         61 LV SV Index:   36       2D Longitudinal Strain LVOT Area:     3.14 cm 2D Strain GLS Avg:     -19.7 %  RIGHT VENTRICLE RV S prime:     11.70 cm/s TAPSE (M-mode): 2.4 cm LEFT ATRIUM  Index       RIGHT ATRIUM           Index LA diam:        3.40 cm 2.03 cm/m  RA Area:     13.70 cm LA Vol (A2C):   70.5 ml 42.07 ml/m RA Volume:   28.00 ml  16.71 ml/m LA Vol (A4C):   43.8 ml 26.14 ml/m LA Biplane Vol: 57.0 ml 34.02 ml/m  AORTIC VALVE LVOT Vmax:   80.90 cm/s LVOT Vmean:  53.700 cm/s LVOT VTI:    0.194 m  AORTA Ao Root diam: 3.10 cm Ao Asc diam:  3.70 cm MITRAL VALVE               TRICUSPID VALVE MV Area (PHT): 2.73 cm    TR Peak grad:   23.8 mmHg MV Decel Time: 278 msec    TR Vmax:        244.00 cm/s MV E velocity: 66.00 cm/s MV A velocity: 73.70 cm/s  SHUNTS MV E/A ratio:  0.90        Systemic VTI:  0.19 m                            Systemic Diam: 2.00 cm Cynthia Chiquito MD Electronically signed by Cynthia Chiquito MD Signature Date/Time: 02/08/2020/8:05:26 PM    Final      ELIGIBLE FOR AVAILABLE RESEARCH PROTOCOL: AET  ASSESSMENT: 74 y.o. [place] woman status post left breast upper outer quadrant biopsy 11/17/2019 for a clinical T2 N1, stage IIA invasive ductal carcinoma, grade 2, estrogen and progesterone receptor positive, HER-2 not amplified, with an MIB-1 of 30%.  (1) status post left lumpectomy and axillary lymph  node dissection 01/12/2020 for a pT2 pN2, stage IIIA invasive ductal carcinoma, grade 3, with negative margins  (a) a total of 14 axillary lymph nodes removed, 8 positive, with extracapsular extension  (2) adjuvant chemotherapy will consist of doxorubicin and cyclophosphamide in dose dense fashion x4 starting 02/13/2020, followed by weekly paclitaxel x12  (a) echocardiogram 02/08/2020 showed an ejection fraction in the 65/70%  (3) adjuvant radiation to follow  (4) antiestrogens to start at the completion of local treatment   PLAN: Cynthia Vasquez tolerated her first cycle of chemo well.  She is profoundly neutropenic as expected and she will take ciprofloxacin beginning this evening, twice daily for a total of 10 doses which is 5 days.  I wrote on the prescription that after 5 days she will stop taking Cipro and then repeat on day 8 of cycle 2 3 and 4.  I entered all her subsequent cycles through August 24.  She will see a physician's assistant on treatment day.  I am going to see her on day 8 at least day 8 cycle 2.  I let her know that she can expect to be fairly shocked when she does lose her hair which will happen in the next 10 days or so.  She does not have a thermometer but she will buy one today from the pharmacy when she gets her ciprofloxacin.  I reassured her that the symptoms on the next 3 cycles will be quite similar.  Unfortunately the bad taste in her mouth is going to be there for the next several months, at least until she is completed all her chemotherapy treatments.  She knows to call for any other issue that may develop before the next visit.  Total encounter time 35 minutes.*  Virgie Dad. Jacques Fife, MD 02/20/2020 3:32 PM Medical Oncology and Hematology [facility] [place], [place] [address] Tel. [phone]    Fax. [phone]   I, Wilburn Mylar, am acting as scribe for Dr. Virgie Dad. Tejal Monroy.  I, Lurline Del MD, have reviewed the above  documentation for accuracy and completeness, and I agree with the above.    *Total Encounter Time as defined by the Centers for Medicare and Medicaid Services includes, in addition to the face-to-face time of a patient visit (documented in the note above) non-face-to-face time: obtaining and reviewing outside history, ordering and reviewing medications, tests or procedures, care coordination (communications with other health care professionals or caregivers) and documentation in the medical record.

## 2020-02-21 ENCOUNTER — Telehealth: Payer: Self-pay | Admitting: Oncology

## 2020-02-21 NOTE — Telephone Encounter (Signed)
Scheduled per 7/20 sch message. Unable to reach pt. Left voicemail with appt time and date on 8/3.

## 2020-02-21 NOTE — Progress Notes (Signed)
Pharmacist Chemotherapy Monitoring - Follow Up Assessment    I verify that I have reviewed each item in the below checklist:   Regimen for the patient is scheduled for the appropriate day and plan matches scheduled date.  Appropriate non-routine labs are ordered dependent on drug ordered.  If applicable, additional medications reviewed and ordered per protocol based on lifetime cumulative doses and/or treatment regimen.   Plan for follow-up and/or issues identified: Yes  I-vent associated with next due treatment: Yes  MD and/or nursing notified: No   Kennith Center, Pharm.D., CPP 02/21/2020@5 :04 PM

## 2020-02-22 ENCOUNTER — Telehealth: Payer: Self-pay | Admitting: Adult Health

## 2020-02-22 NOTE — Telephone Encounter (Signed)
Scheduled appts per 7/20 los. Pt to get updated appt calendar at next visit per appt notes.

## 2020-02-27 ENCOUNTER — Inpatient Hospital Stay (HOSPITAL_BASED_OUTPATIENT_CLINIC_OR_DEPARTMENT_OTHER): Payer: Medicare HMO | Admitting: Nurse Practitioner

## 2020-02-27 ENCOUNTER — Other Ambulatory Visit: Payer: Self-pay | Admitting: Oncology

## 2020-02-27 ENCOUNTER — Encounter: Payer: Self-pay | Admitting: *Deleted

## 2020-02-27 ENCOUNTER — Inpatient Hospital Stay: Payer: Medicare HMO

## 2020-02-27 ENCOUNTER — Encounter: Payer: Self-pay | Admitting: Nurse Practitioner

## 2020-02-27 ENCOUNTER — Other Ambulatory Visit: Payer: Self-pay

## 2020-02-27 VITALS — BP 147/66 | HR 76 | Temp 98.4°F | Resp 18 | Ht 59.0 in | Wt 160.3 lb

## 2020-02-27 DIAGNOSIS — R197 Diarrhea, unspecified: Secondary | ICD-10-CM | POA: Diagnosis not present

## 2020-02-27 DIAGNOSIS — R911 Solitary pulmonary nodule: Secondary | ICD-10-CM | POA: Diagnosis not present

## 2020-02-27 DIAGNOSIS — C50412 Malignant neoplasm of upper-outer quadrant of left female breast: Secondary | ICD-10-CM

## 2020-02-27 DIAGNOSIS — Z5189 Encounter for other specified aftercare: Secondary | ICD-10-CM | POA: Diagnosis not present

## 2020-02-27 DIAGNOSIS — R5383 Other fatigue: Secondary | ICD-10-CM | POA: Diagnosis not present

## 2020-02-27 DIAGNOSIS — Z95828 Presence of other vascular implants and grafts: Secondary | ICD-10-CM

## 2020-02-27 DIAGNOSIS — Z170 Estrogen receptor positive status [ER+]: Secondary | ICD-10-CM

## 2020-02-27 DIAGNOSIS — Z5111 Encounter for antineoplastic chemotherapy: Secondary | ICD-10-CM | POA: Diagnosis not present

## 2020-02-27 DIAGNOSIS — R110 Nausea: Secondary | ICD-10-CM | POA: Diagnosis not present

## 2020-02-27 DIAGNOSIS — C773 Secondary and unspecified malignant neoplasm of axilla and upper limb lymph nodes: Secondary | ICD-10-CM | POA: Diagnosis not present

## 2020-02-27 LAB — CBC WITH DIFFERENTIAL/PLATELET
Abs Immature Granulocytes: 0.7 10*3/uL — ABNORMAL HIGH (ref 0.00–0.07)
Band Neutrophils: 6 %
Basophils Absolute: 0.2 10*3/uL — ABNORMAL HIGH (ref 0.0–0.1)
Basophils Relative: 1 %
Eosinophils Absolute: 0.2 10*3/uL (ref 0.0–0.5)
Eosinophils Relative: 1 %
HCT: 32.6 % — ABNORMAL LOW (ref 36.0–46.0)
Hemoglobin: 10.9 g/dL — ABNORMAL LOW (ref 12.0–15.0)
Lymphocytes Relative: 7 %
Lymphs Abs: 1.2 10*3/uL (ref 0.7–4.0)
MCH: 29.9 pg (ref 26.0–34.0)
MCHC: 33.4 g/dL (ref 30.0–36.0)
MCV: 89.6 fL (ref 80.0–100.0)
Metamyelocytes Relative: 3 %
Monocytes Absolute: 1.2 10*3/uL — ABNORMAL HIGH (ref 0.1–1.0)
Monocytes Relative: 7 %
Myelocytes: 1 %
Neutro Abs: 14.2 10*3/uL — ABNORMAL HIGH (ref 1.7–7.7)
Neutrophils Relative %: 74 %
Platelets: 271 10*3/uL (ref 150–400)
RBC: 3.64 MIL/uL — ABNORMAL LOW (ref 3.87–5.11)
RDW: 13.7 % (ref 11.5–15.5)
WBC: 17.7 10*3/uL — ABNORMAL HIGH (ref 4.0–10.5)
nRBC: 0.1 % (ref 0.0–0.2)

## 2020-02-27 LAB — COMPREHENSIVE METABOLIC PANEL
ALT: 15 U/L (ref 0–44)
AST: 19 U/L (ref 15–41)
Albumin: 4 g/dL (ref 3.5–5.0)
Alkaline Phosphatase: 98 U/L (ref 38–126)
Anion gap: 9 (ref 5–15)
BUN: 24 mg/dL — ABNORMAL HIGH (ref 8–23)
CO2: 26 mmol/L (ref 22–32)
Calcium: 10.4 mg/dL — ABNORMAL HIGH (ref 8.9–10.3)
Chloride: 104 mmol/L (ref 98–111)
Creatinine, Ser: 1.12 mg/dL — ABNORMAL HIGH (ref 0.44–1.00)
GFR calc Af Amer: 56 mL/min — ABNORMAL LOW (ref >60)
GFR calc non Af Amer: 49 mL/min — ABNORMAL LOW (ref >60)
Glucose, Bld: 125 mg/dL — ABNORMAL HIGH (ref 70–99)
Potassium: 4 mmol/L (ref 3.5–5.1)
Sodium: 139 mmol/L (ref 135–145)
Total Bilirubin: 0.3 mg/dL (ref 0.3–1.2)
Total Protein: 7.2 g/dL (ref 6.5–8.1)

## 2020-02-27 MED ORDER — DOXORUBICIN HCL CHEMO IV INJECTION 2 MG/ML
60.0000 mg/m2 | Freq: Once | INTRAVENOUS | Status: AC
  Administered 2020-02-27: 106 mg via INTRAVENOUS
  Filled 2020-02-27: qty 53

## 2020-02-27 MED ORDER — SODIUM CHLORIDE 0.9% FLUSH
10.0000 mL | Freq: Once | INTRAVENOUS | Status: AC
  Administered 2020-02-27: 10 mL
  Filled 2020-02-27: qty 10

## 2020-02-27 MED ORDER — SODIUM CHLORIDE 0.9 % IV SOLN
Freq: Once | INTRAVENOUS | Status: DC
  Filled 2020-02-27: qty 250

## 2020-02-27 MED ORDER — PALONOSETRON HCL INJECTION 0.25 MG/5ML
INTRAVENOUS | Status: AC
  Filled 2020-02-27: qty 5

## 2020-02-27 MED ORDER — HEPARIN SOD (PORK) LOCK FLUSH 100 UNIT/ML IV SOLN
500.0000 [IU] | Freq: Once | INTRAVENOUS | Status: AC | PRN
  Administered 2020-02-27: 500 [IU]
  Filled 2020-02-27: qty 5

## 2020-02-27 MED ORDER — SODIUM CHLORIDE 0.9 % IV SOLN
600.0000 mg/m2 | Freq: Once | INTRAVENOUS | Status: AC
  Administered 2020-02-27: 1060 mg via INTRAVENOUS
  Filled 2020-02-27: qty 53

## 2020-02-27 MED ORDER — ONDANSETRON HCL 8 MG PO TABS
8.0000 mg | ORAL_TABLET | Freq: Three times a day (TID) | ORAL | 0 refills | Status: DC | PRN
Start: 2020-02-27 — End: 2020-07-02

## 2020-02-27 MED ORDER — SODIUM CHLORIDE 0.9 % IV SOLN
150.0000 mg | Freq: Once | INTRAVENOUS | Status: AC
  Administered 2020-02-27: 150 mg via INTRAVENOUS
  Filled 2020-02-27: qty 150

## 2020-02-27 MED ORDER — PALONOSETRON HCL INJECTION 0.25 MG/5ML
0.2500 mg | Freq: Once | INTRAVENOUS | Status: AC
  Administered 2020-02-27: 0.25 mg via INTRAVENOUS

## 2020-02-27 MED ORDER — SODIUM CHLORIDE 0.9 % IV SOLN
10.0000 mg | Freq: Once | INTRAVENOUS | Status: AC
  Administered 2020-02-27: 10 mg via INTRAVENOUS
  Filled 2020-02-27: qty 10

## 2020-02-27 MED ORDER — SODIUM CHLORIDE 0.9% FLUSH
10.0000 mL | INTRAVENOUS | Status: DC | PRN
  Administered 2020-02-27: 10 mL
  Filled 2020-02-27: qty 10

## 2020-02-27 MED ORDER — SODIUM CHLORIDE 0.9 % IV SOLN
Freq: Once | INTRAVENOUS | Status: AC
  Filled 2020-02-27: qty 250

## 2020-02-27 MED ORDER — PROMETHAZINE HCL 25 MG PO TABS
25.0000 mg | ORAL_TABLET | Freq: Three times a day (TID) | ORAL | 0 refills | Status: DC | PRN
Start: 2020-02-27 — End: 2020-05-07

## 2020-02-27 NOTE — Progress Notes (Signed)
[facility]   [phone] [phone]   Clinic Follow up Note   Patient Care Team: Charlane Ferretti, DO as PCP - General (Internal Medicine) Donnelly Angelica, RN as Oncology Nurse Navigator Pershing Proud, RN as Oncology Nurse Navigator Magrinat, Valentino Hue, MD as Consulting Physician (Oncology) Ovidio Kin, MD as Consulting Physician (General Surgery) Dorothy Puffer, MD as Consulting Physician (Radiation Oncology) Charlott Rakes, MD as Consulting Physician (Gastroenterology) 02/27/2020  CHIEF COMPLAINT: Follow-up ER/PR pos/HER-2 neg left breast cancer  CURRENT THERAPY: Adjuvant chemo beginning with Adriamycin and cyclophosphamide on 02/13/2020, to be followed by weekly taxol x12  INTERVAL HISTORY: Cynthia Vasquez returns for follow-up and treatment as scheduled.  She completed cycle 1 AC on 02/13/2020 with Fulphila. She had tolerable bone pain, mainly in lower back, and symptoms of restless leg. She feels OK today. Hair is coming out expectedly. She has mild fatigue but remains functional and active at home. She took compazine for mild nausea but it upset her stomach. No vomiting. Has loose stool after meals. Otherwise, denies fever, chill, mucositis, rash, cough, chest pain, dyspnea, leg edema, or concerns with her breasts.   MEDICAL HISTORY:  Past Medical History:  Diagnosis Date  . Atrial septal defect    repaired age 18  . Cancer (HCC) 01/2020   IDC left breast  . GERD (gastroesophageal reflux disease)   . Glaucoma   . Headache    migraines until age 50- 12, have them 3-4 x year  . History of blood transfusion    with ASD surgery at age 37  . History of kidney stones    passed stones  . Hypertension   . Vitamin D deficiency     SURGICAL HISTORY: Past Surgical History:  Procedure Laterality Date  . ASD REPAIR     age 4  . BREAST LUMPECTOMY WITH RADIOACTIVE SEED AND AXILLARY LYMPH NODE DISSECTION Left 01/12/2020   Procedure: LEFT BREAST  LUMPECTOMY WITH RADIOACTIVE SEED AND LEFT AXILLARY LYMPH NODE DISSECTION;  Surgeon: Ovidio Kin, MD;  Location[facility] OR;  Service: General;  Laterality: Left;  PEC BLOCK  . COLONOSCOPY    . PORTACATH PLACEMENT Right 02/02/2020   Procedure: INSERTION PORT-A-CATH WITH ULTRASOUND GUIDANCE;  Surgeon: Ovidio Kin, MD;  Location: [facility];  Service: General;  Laterality: Right;  . UPPER GI ENDOSCOPY      I have reviewed the social history and family history with the patient and they are unchanged from previous note.  ALLERGIES:  has No Known Allergies.  MEDICATIONS:  Current Outpatient Medications  Medication Sig Dispense Refill  . acetaminophen (TYLENOL) 500 MG tablet Take 500 mg by mouth every 6 (six) hours as needed for mild pain.     Marland Kitchen acetaminophen (TYLENOL) 500 MG tablet Take 1 tablet (500 mg total) by mouth every 8 (eight) hours as needed. Take with aleve 220 md 60 tablet 0  . b complex vitamins tablet Take 1 tablet by mouth 3 (three) times a week.    . calcium gluconate 500 MG tablet Take 1 tablet (500 mg total) by mouth 3 (three) times daily.    . cholecalciferol (VITAMIN D) 1000 UNITS tablet Take 1,000 Units by mouth daily.    . ciprofloxacin (CIPRO) 500 MG tablet Take 1 tablet (500 mg total) by mouth 2 (two) times daily. Start day 8 after chemotherapy, take one tablet twice a day for five days then stop. Repeat next chemo cycle. 40 tablet 0  . dexamethasone (DECADRON)  4 MG tablet Counting chemo day as day 1, take 2 tablets by mouth twice a day on days 2 and 3 then in AM day 4, then stop 30 tablet 1  . hydrochlorothiazide (HYDRODIURIL) 25 MG tablet Take 25 mg by mouth daily.     Marland Kitchen latanoprost (XALATAN) 0.005 % ophthalmic solution Place 1 drop into both eyes at bedtime.    . lidocaine-prilocaine (EMLA) cream Apply to affected area once 30 g 3  . LORazepam (ATIVAN) 0.5 MG tablet Take 1 tablet (0.5 mg total) by mouth at bedtime as needed (Nausea or vomiting). 20 tablet 0  .  losartan (COZAAR) 50 MG tablet Take 50 mg by mouth daily.     . Multiple Vitamins-Minerals (MULTIVITAMIN WITH MINERALS) tablet Take 1 tablet by mouth daily.    . naproxen sodium (ALEVE) 220 MG tablet Take 1 tablet (220 mg total) by mouth every 8 (eight) hours as needed. Take with tylenol 500 mg. 60 tablet 3  . ondansetron (ZOFRAN) 8 MG tablet Take 1 tablet (8 mg total) by mouth every 8 (eight) hours as needed for nausea or vomiting. Start on day 3 after chemo if needed 20 tablet 0  . pantoprazole (PROTONIX) 40 MG tablet Take 40 mg by mouth every other day.     . prochlorperazine (COMPAZINE) 10 MG tablet Take 1 tablet (10 mg total) by mouth 4 (four) times daily -  before meals and at bedtime. Star the evening of chemotherapy and continue for 3 days 30 tablet 1  . promethazine (PHENERGAN) 25 MG tablet Take 1 tablet (25 mg total) by mouth every 8 (eight) hours as needed for nausea or vomiting. 30 tablet 0  . traMADol (ULTRAM) 50 MG tablet Take 1-2 tablets (50-100 mg total) by mouth every 6 (six) hours as needed. 60 tablet 0   Current Facility-Administered Medications  Medication Dose Route Frequency Provider Last Rate Last Admin  . 0.9 %  sodium chloride infusion   Intravenous Once Alla Feeling, NP       Facility-Administered Medications Ordered in Other Visits  Medication Dose Route Frequency Provider Last Rate Last Admin  . 0.9 %  sodium chloride infusion   Intravenous Once Alla Feeling, NP 999 mL/hr at 02/27/20 1339 New Bag at 02/27/20 1339  . cyclophosphamide (CYTOXAN) 1,060 mg in sodium chloride 0.9 % 250 mL chemo infusion  600 mg/m2 (Treatment Plan Recorded) Intravenous Once Magrinat, Virgie Dad, MD      . dexamethasone (DECADRON) 10 mg in sodium chloride 0.9 % 50 mL IVPB  10 mg Intravenous Once Magrinat, Virgie Dad, MD 204 mL/hr at 02/27/20 1348 10 mg at 02/27/20 1348  . DOXOrubicin (ADRIAMYCIN) chemo injection 106 mg  60 mg/m2 (Treatment Plan Recorded) Intravenous Once Magrinat, Virgie Dad, MD       . fosaprepitant (EMEND) 150 mg in sodium chloride 0.9 % 145 mL IVPB  150 mg Intravenous Once Magrinat, Virgie Dad, MD      . heparin lock flush 100 unit/mL  500 Units Intracatheter Once PRN Magrinat, Virgie Dad, MD      . sodium chloride flush (NS) 0.9 % injection 10 mL  10 mL Intracatheter PRN Magrinat, Virgie Dad, MD        PHYSICAL EXAMINATION: ECOG PERFORMANCE STATUS: 1 - Symptomatic but completely ambulatory  Vitals:   02/27/20 1213  BP: (!) 147/66  Pulse: 76  Resp: 18  Temp: 98.4 F (36.9 C)  SpO2: 99%   Filed Weights   02/27/20 1213  Weight: 160 lb 4.8 oz (72.7 kg)    GENERAL:alert, no distress and comfortable SKIN: No rash to exposed skin EYES: sclera clear LUNGS: normal breathing effort HEART:  no lower extremity edema NEURO: alert & oriented x 3 with fluent speech, no focal motor/sensory deficits Breast: S/p left lumpectomy, incisions completely healed.  No erythema or drainage.  Mild scar tissue versus seroma under the left axillary incision  LABORATORY DATA:  I have reviewed the data as listed CBC Latest Ref Rng & Units 02/27/2020 02/20/2020 02/13/2020  WBC 4.0 - 10.5 K/uL 17.7(H) 1.1(L) 7.6  Hemoglobin 12.0 - 15.0 g/dL 10.9(L) 10.6(L) 11.6(L)  Hematocrit 36 - 46 % 32.6(L) 31.4(L) 34.7(L)  Platelets 150 - 400 K/uL 271 153 343     CMP Latest Ref Rng & Units 02/27/2020 02/20/2020 02/13/2020  Glucose 70 - 99 mg/dL 125(H) 122(H) 97  BUN 8 - 23 mg/dL 24(H) 29(H) 28(H)  Creatinine 0.44 - 1.00 mg/dL 1.12(H) 1.09(H) 1.02(H)  Sodium 135 - 145 mmol/L 139 137 137  Potassium 3.5 - 5.1 mmol/L 4.0 4.2 3.9  Chloride 98 - 111 mmol/L 104 104 102  CO2 22 - 32 mmol/L _0 Calcium 8.9 - 10.3 mg/dL 10.4(H) 9.4 9.9  Total Protein 6.5 - 8.1 g/dL 7.2 6.5 7.7  Total Bilirubin 0.3 - 1.2 mg/dL 0.3 0.6 0.5  Alkaline Phos 38 - 126 U/L 98 79 108  AST 15 - 41 U/L 19 11(L) 30  ALT 0 - 44 U/L 15 16 43      RADIOGRAPHIC STUDIES: I have personally reviewed the radiological images  as listed and agreed with the findings in the report. No results found.   ASSESSMENT & PLAN: 74 year old postmenopausal female with  1.  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  Short interval follow-up was recommended.  Bone scan was negative -Baseline echo showed EF 65-70% with normal GLS -19.7% -To reduce the recurrence risk of her locally advanced disease, she began adjuvant chemo consisting of dose-dense AC on 02/13/2020, the plan is to complete 4 cycles given every 2 weeks followed by weekly Taxol x12 cycles -Her treatment plan includes adjuvant radiation to follow chemo, then antiestrogens  Disposition: Cynthia Vasquez appears stable.  She completed 1 cycle adjuvant AC with Fulphila.  Tolerated treatment well overall with mild fatigue, loose stool, and nausea.  She was able to recover and function well.  I recommend to hold Compazine and try Phenergan, I will add Zofran to start on day 3 as needed.  I recommend Imodium as needed for loose stool and to remain adequately hydrated.  CBC shows good response to G-CSF.  CMP with mildly elevated serum creatinine and calcium likely related to mild dehydration.  We will add 500 cc NS with treatment.  She will proceed with cycle 2 adjuvant AC today as planned, and Fulphila on 7/29.  She knows to continue decadron as prescribed from days 2-4 and begin Cipro for prophylaxis on day 8.  She  will  return for lab and follow-up with Dr. Jana Hakim on 8/3 for toxicity check.  All questions were answered. The patient knows to call the clinic with any problems, questions or concerns. No barriers to learning were detected.     Alla Feeling, NP 02/27/20

## 2020-02-27 NOTE — Patient Instructions (Signed)
[facility] Discharge Instructions for Patients Receiving Chemotherapy  Today you received the following chemotherapy agents Doxorubicin, Cytoxan.  To help prevent nausea and vomiting after your treatment, we encourage you to take your nausea medication. DO NOT TAKE ZOFRAN FOR THREE DAYS AFTER TREATMENT.   If you develop nausea and vomiting that is not controlled by your nausea medication, call the clinic.   BELOW ARE SYMPTOMS THAT SHOULD BE REPORTED IMMEDIATELY:  *FEVER GREATER THAN 100.5 F  *CHILLS WITH OR WITHOUT FEVER  NAUSEA AND VOMITING THAT IS NOT CONTROLLED WITH YOUR NAUSEA MEDICATION  *UNUSUAL SHORTNESS OF BREATH  *UNUSUAL BRUISING OR BLEEDING  TENDERNESS IN MOUTH AND THROAT WITH OR WITHOUT PRESENCE OF ULCERS  *URINARY PROBLEMS  *BOWEL PROBLEMS  UNUSUAL RASH Items with * indicate a potential emergency and should be followed up as soon as possible.  Feel free to call the clinic should you have any questions or concerns. The clinic phone number is [phone].  Please show the CHEMO ALERT CARD at check-in to the Emergency Department and triage nurse.   

## 2020-02-29 ENCOUNTER — Inpatient Hospital Stay: Payer: Medicare HMO

## 2020-02-29 ENCOUNTER — Other Ambulatory Visit: Payer: Self-pay

## 2020-02-29 VITALS — BP 159/72 | HR 74 | Temp 98.7°F | Resp 18

## 2020-02-29 DIAGNOSIS — R5383 Other fatigue: Secondary | ICD-10-CM | POA: Diagnosis not present

## 2020-02-29 DIAGNOSIS — R110 Nausea: Secondary | ICD-10-CM | POA: Diagnosis not present

## 2020-02-29 DIAGNOSIS — Z5189 Encounter for other specified aftercare: Secondary | ICD-10-CM | POA: Diagnosis not present

## 2020-02-29 DIAGNOSIS — Z170 Estrogen receptor positive status [ER+]: Secondary | ICD-10-CM | POA: Diagnosis not present

## 2020-02-29 DIAGNOSIS — R197 Diarrhea, unspecified: Secondary | ICD-10-CM | POA: Diagnosis not present

## 2020-02-29 DIAGNOSIS — C50412 Malignant neoplasm of upper-outer quadrant of left female breast: Secondary | ICD-10-CM

## 2020-02-29 DIAGNOSIS — Z5111 Encounter for antineoplastic chemotherapy: Secondary | ICD-10-CM | POA: Diagnosis not present

## 2020-02-29 DIAGNOSIS — R911 Solitary pulmonary nodule: Secondary | ICD-10-CM | POA: Diagnosis not present

## 2020-02-29 DIAGNOSIS — C773 Secondary and unspecified malignant neoplasm of axilla and upper limb lymph nodes: Secondary | ICD-10-CM | POA: Diagnosis not present

## 2020-02-29 MED ORDER — PEGFILGRASTIM-JMDB 6 MG/0.6ML ~~LOC~~ SOSY
6.0000 mg | PREFILLED_SYRINGE | Freq: Once | SUBCUTANEOUS | Status: AC
  Administered 2020-02-29: 6 mg via SUBCUTANEOUS

## 2020-02-29 MED ORDER — PEGFILGRASTIM-JMDB 6 MG/0.6ML ~~LOC~~ SOSY
PREFILLED_SYRINGE | SUBCUTANEOUS | Status: AC
  Filled 2020-02-29: qty 0.6

## 2020-02-29 NOTE — Patient Instructions (Signed)

## 2020-03-03 DIAGNOSIS — U071 COVID-19: Secondary | ICD-10-CM | POA: Diagnosis not present

## 2020-03-04 DIAGNOSIS — H524 Presbyopia: Secondary | ICD-10-CM | POA: Diagnosis not present

## 2020-03-04 DIAGNOSIS — H401131 Primary open-angle glaucoma, bilateral, mild stage: Secondary | ICD-10-CM | POA: Diagnosis not present

## 2020-03-04 DIAGNOSIS — H2513 Age-related nuclear cataract, bilateral: Secondary | ICD-10-CM | POA: Diagnosis not present

## 2020-03-04 NOTE — Progress Notes (Signed)
[place]  Telephone:([phone] Fax:(336) [phone]     ID: ARIYONNA TWICHELL DOB: 18-Oct-1945  MR#: [id number]  VOZ#:[id number]  Patient Care Team: Sueanne Margarita, DO as PCP - General (Internal Medicine) Rockwell Germany, RN as Oncology Nurse Navigator Mauro Kaufmann, RN as Oncology Nurse Navigator Tiny Chaudhary, Virgie Dad, MD as Consulting Physician (Oncology) Alphonsa Overall, MD as Consulting Physician (General Surgery) Kyung Rudd, MD as Consulting Physician (Radiation Oncology) Wilford Corner, MD as Consulting Physician (Gastroenterology) Chauncey Cruel, MD OTHER MD:  CHIEF COMPLAINT: Estrogen receptor positive breast cancer  CURRENT TREATMENT: adjuvant chemotherapy    INTERVAL HISTORY: Cynthia Vasquez returns today for follow-up and treatment of her estrogen receptor positive breast cancer.   She started adjuvant chemotherapy consisting of doxorubicin and cyclophosphamide on 02/13/2020 given every 14 days with 4 cycles planned, to be followed by weekly paclitaxel x12. Today is day 8 cycle 2 of AC.   Her most recent echocardiogram from 02/08/2020 showed an ejection fraction of 65-70%.  Lab Results  Component Value Date   CA2729 10.4 02/13/2020    REVIEW OF SYSTEMS: Cynthia Vasquez had a terrible time with cycle 2 of chemo.  She felt extremely fatigued, even like she might faint.  She had nausea although no vomiting and her stomach has been "riled up..  She said difficulty eating much of anything and has terrible taste perversion.  Pretty much she is drinking water.  She has had some loose stools, not liquid.  She has had some burning with urination.  A detailed review of systems today was otherwise stable   HISTORY OF CURRENT ILLNESS: From the original intake note:  Journei herself palpated a left breast mass, with associated dimpling, tenderness, and pinching pain. She underwent bilateral diagnostic mammography with tomography and left breast ultrasonography at [facility]  on 11/09/2019 showing: breast density category C; 2.9 cm irregular mass in left breast at 2:30 corresponding to palpable abnormality; three enlarged left axillary lymph nodes.  Accordingly on 11/17/2019 she proceeded to biopsy of the left breast area in question. The pathology from this procedure ([id number]) showed: invasive mammary carcinoma, grade 2, e-cadherin positive. Prognostic indicators significant for: estrogen receptor, 95% positive and progesterone receptor, 95% positive, both with strong staining intensity. Proliferation marker Ki67 at 30%. HER2 negative by immunohistochemistry (1+).  The biopsied lymph node was positive for metastatic carcinoma.  The patient's subsequent history is as detailed below.   PAST MEDICAL HISTORY: Past Medical History:  Diagnosis Date  . Atrial septal defect    repaired age 8  . Cancer ([place]) 01/2020   IDC left breast  . GERD (gastroesophageal reflux disease)   . Glaucoma   . Headache    migraines until age 73- 49, have them 3-4 x year  . History of blood transfusion    with ASD surgery at age 72  . History of kidney stones    passed stones  . Hypertension   . Vitamin D deficiency     PAST SURGICAL HISTORY: Past Surgical History:  Procedure Laterality Date  . ASD REPAIR     age 39  . BREAST LUMPECTOMY WITH RADIOACTIVE SEED AND AXILLARY LYMPH NODE DISSECTION Left 01/12/2020   Procedure: LEFT BREAST LUMPECTOMY WITH RADIOACTIVE SEED AND LEFT AXILLARY LYMPH NODE DISSECTION;  Surgeon: Alphonsa Overall, MD;  Location: [facility];  Service: General;  Laterality: Left;  PEC BLOCK  . COLONOSCOPY    . PORTACATH PLACEMENT Right 02/02/2020   Procedure: INSERTION PORT-A-CATH WITH ULTRASOUND GUIDANCE;  Surgeon: Alphonsa Overall, MD;  Location: [facility];  Service: General;  Laterality: Right;  . UPPER GI ENDOSCOPY      FAMILY HISTORY: Family History  Problem Relation Age of Onset  . Dementia Mother   . Heart attack Father   . Hypertension  Father   . Breast cancer Sister 46  The patient's father died at age 78 from a myocardial infarction.  The patient's mother died at 9 with Alzheimer's disease.  The patient has 2 brothers, 2 sisters.  1 sister developed breast cancer at the age of 12.  The patient's daughter has a history of thyroid cancer.  She has not been genetically tested.  There is no other history of cancer in the family to the patient's knowledge.   GYNECOLOGIC HISTORY:  No LMP recorded (lmp unknown). Patient is postmenopausal. Menarche: 74 years old Age at first live birth: 74 years old [place] P 3 LMP age 16 Contraceptive approximately 2 years, with no complications HRT no  Hysterectomy?  No BSO?  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  She is now retired.  Her husband Cynthia Vasquez (goes by [employer]") is also a retired Optometrist.  Son Cynthia Vasquez 90 lives in [place] and works for a company that processes [employer] for [employer], but he completed a masters in Chief Executive Officer and is looking for a different job; daughter Cynthia Vasquez lives in [place] and is a Oncologist; son Cynthia Vasquez lives in [place] and runs a business that puts and tennis on cell towers.  The patient has 8 grandchildren.  She attends our [employer].    ADVANCED DIRECTIVES: In the absence of any documents to the contrary the patient's husband is her healthcare power of attorney   HEALTH MAINTENANCE: Social History   Tobacco Use  . Smoking status: Never Smoker  . Smokeless tobacco: Never Used  Vaping Use  . Vaping Use: Never used  Substance Use Topics  . Alcohol use: No  . Drug use: No     Colonoscopy: "Less than 10 years ago"; [facility]  PAP: Remote  Bone density: Never   No Known Allergies  Current Outpatient Medications  Medication Sig Dispense Refill  . acetaminophen (TYLENOL) 500 MG tablet Take 500 mg by mouth every 6 (six) hours as needed for mild pain.     Marland Kitchen  acetaminophen (TYLENOL) 500 MG tablet Take 1 tablet (500 mg total) by mouth every 8 (eight) hours as needed. Take with aleve 220 md 60 tablet 0  . b complex vitamins tablet Take 1 tablet by mouth 3 (three) times a week.    . calcium gluconate 500 MG tablet Take 1 tablet (500 mg total) by mouth 3 (three) times daily.    . cholecalciferol (VITAMIN D) 1000 UNITS tablet Take 1,000 Units by mouth daily.    . ciprofloxacin (CIPRO) 500 MG tablet Take 1 tablet (500 mg total) by mouth 2 (two) times daily. Start day 8 after chemotherapy, take one tablet twice a day for five days then stop. Repeat next chemo cycle. 40 tablet 0  . dexamethasone (DECADRON) 4 MG tablet Counting chemo day as day 1, take 2 tablets by mouth twice a day on days 2 and 3 then in AM day 4, then stop 30 tablet 1  . hydrochlorothiazide (HYDRODIURIL) 25 MG tablet Take 25 mg by mouth daily.     Marland Kitchen latanoprost (XALATAN) 0.005 % ophthalmic solution Place 1 drop  into both eyes at bedtime.    . lidocaine-prilocaine (EMLA) cream Apply to affected area once 30 g 3  . LORazepam (ATIVAN) 0.5 MG tablet Take 1 tablet (0.5 mg total) by mouth at bedtime as needed (Nausea or vomiting). 20 tablet 0  . losartan (COZAAR) 50 MG tablet Take 50 mg by mouth daily.     . Multiple Vitamins-Minerals (MULTIVITAMIN WITH MINERALS) tablet Take 1 tablet by mouth daily.    . naproxen sodium (ALEVE) 220 MG tablet Take 1 tablet (220 mg total) by mouth every 8 (eight) hours as needed. Take with tylenol 500 mg. 60 tablet 3  . ondansetron (ZOFRAN) 8 MG tablet Take 1 tablet (8 mg total) by mouth every 8 (eight) hours as needed for nausea or vomiting. Start on day 3 after chemo if needed 20 tablet 0  . pantoprazole (PROTONIX) 40 MG tablet Take 40 mg by mouth every other day.     . prochlorperazine (COMPAZINE) 10 MG tablet Take 1 tablet (10 mg total) by mouth 4 (four) times daily -  before meals and at bedtime. Star the evening of chemotherapy and continue for 3 days 30 tablet 1    . promethazine (PHENERGAN) 25 MG tablet Take 1 tablet (25 mg total) by mouth every 8 (eight) hours as needed for nausea or vomiting. 30 tablet 0  . traMADol (ULTRAM) 50 MG tablet Take 1-2 tablets (50-100 mg total) by mouth every 6 (six) hours as needed. 60 tablet 0   No current facility-administered medications for this visit.    OBJECTIVE: White woman who appears stated age  74:   03/05/20 0850  BP: (!) 111/52  Pulse: 79  Resp: 18  Temp: 98.5 F (36.9 C)  SpO2: 99%   Wt Readings from Last 3 Encounters:  03/05/20 157 lb 4.8 oz (71.4 kg)  02/27/20 160 lb 4.8 oz (72.7 kg)  02/20/20 161 lb (73 kg)   Body mass index is 31.77 kg/m.    ECOG FS:1 - Symptomatic but completely ambulatory  Sclerae unicteric, EOMs intact Wearing a mask No cervical or supraclavicular adenopathy Lungs no rales or rhonchi Heart regular rate and rhythm Abd soft, nontender, positive bowel sounds MSK no focal spinal tenderness, no upper extremity lymphedema Neuro: nonfocal, well oriented, appropriate affect Breasts: Deferred   LAB RESULTS:  CMP     Component Value Date/Time   NA 135 03/05/2020 0838   K 4.3 03/05/2020 0838   CL 102 03/05/2020 0838   CO2 24 03/05/2020 0838   GLUCOSE 107 (H) 03/05/2020 0838   BUN 22 03/05/2020 0838   CREATININE 1.19 (H) 03/05/2020 0838   CREATININE 1.06 (H) 11/29/2019 0845   CALCIUM 9.7 03/05/2020 0838   PROT 6.5 03/05/2020 0838   ALBUMIN 3.7 03/05/2020 0838   AST 13 (L) 03/05/2020 0838   AST 23 11/29/2019 0845   ALT 13 03/05/2020 0838   ALT 17 11/29/2019 0845   ALKPHOS 76 03/05/2020 0838   BILITOT 0.6 03/05/2020 0838   BILITOT 0.6 11/29/2019 0845   GFRNONAA 45 (L) 03/05/2020 0838   GFRNONAA 52 (L) 11/29/2019 0845   GFRAA 52 (L) 03/05/2020 0838   GFRAA >60 11/29/2019 0845    No results found for: TOTALPROTELP, ALBUMINELP, A1GS, A2GS, BETS, BETA2SER, GAMS, MSPIKE, SPEI  Lab Results  Component Value Date   WBC 0.3 (LL) 03/05/2020   NEUTROABS  0.1 (LL) 03/05/2020   HGB 10.5 (L) 03/05/2020   HCT 30.8 (L) 03/05/2020   MCV 88.0 03/05/2020   PLT  235 03/05/2020    No results found for: LABCA2  No components found for: [id number]  No results for input(s): INR in the last 168 hours.  No results found for: LABCA2  No results found for: YJZ649  No results found for: EPH903  No results found for: WRQ512  Lab Results  Component Value Date   CA2729 10.4 02/13/2020    No components found for: HGQUANT  No results found for: CEA1 / No results found for: CEA1   No results found for: AFPTUMOR  No results found for: CHROMOGRNA  No results found for: KPAFRELGTCHN, LAMBDASER, KAPLAMBRATIO (kappa/lambda light chains)  No results found for: HGBA, HGBA2QUANT, HGBFQUANT, HGBSQUAN (Hemoglobinopathy evaluation)   Lab Results  Component Value Date   LDH 230 (H) 06/28/2019    No results found for: IRON, TIBC, IRONPCTSAT (Iron and TIBC)  Lab Results  Component Value Date   FERRITIN 626 (H) 07/03/2019    Urinalysis    Component Value Date/Time   COLORURINE YELLOW 06/11/2013 2007   APPEARANCEUR CLEAR 06/11/2013 2007   LABSPEC 1.017 06/11/2013 2007   PHURINE 6.5 06/11/2013 2007   GLUCOSEU NEGATIVE 06/11/2013 2007   HGBUR NEGATIVE 06/11/2013 2007   BILIRUBINUR NEGATIVE 06/11/2013 2007   KETONESUR NEGATIVE 06/11/2013 2007   PROTEINUR NEGATIVE 06/11/2013 2007   UROBILINOGEN 0.2 06/11/2013 2007   NITRITE NEGATIVE 06/11/2013 2007   LEUKOCYTESUR NEGATIVE 06/11/2013 2007     STUDIES: ECHOCARDIOGRAM COMPLETE  Result Date: 02/08/2020    ECHOCARDIOGRAM REPORT   Patient Name:   Cynthia Vasquez Date of Exam: 02/08/2020 Medical Rec #:  [id number]      Height:       59.0 in Accession #:    [id number]     Weight:       159.6 lb Date of Birth:  [birth year]     BSA:          1.676 m Patient Age:    73 years       BP:           150/78 mmHg Patient Gender: F              HR:           94 bpm. Exam Location:  Outpatient Procedure: 2D Echo,  Color Doppler, Cardiac Doppler and Strain Analysis Indications:    Chemo Evaluation  History:        Patient has no prior history of Echocardiogram examinations.                 Risk Factors:Hypertension and Breast Cancer. ASD repaired with                 mesh at age 29.  Sonographer:    Irving Burton Senior RDCS Referring Phys: [phone] Lynnox Girten C [place] IMPRESSIONS  1. Left ventricular ejection fraction, by estimation, is 65 to 70%. The left ventricle has normal function. The left ventricle has no regional wall motion abnormalities. Left ventricular diastolic parameters were normal. The average left ventricular global longitudinal strain is -19.7 %. The global longitudinal strain is normal.  2. Right ventricular systolic function is normal. The right ventricular size is normal. There is normal pulmonary artery systolic pressure. The estimated right ventricular systolic pressure is 26.8 mmHg.  3. Left atrial size was mildly dilated.  4. The mitral valve is degenerative. Trivial mitral valve regurgitation. No evidence of mitral stenosis.  5. The aortic valve is tricuspid. Aortic valve regurgitation is not visualized. Mild aortic valve sclerosis  is present, with no evidence of aortic valve stenosis.  6. The inferior vena cava is normal in size with greater than 50% respiratory variability, suggesting right atrial pressure of 3 mmHg. FINDINGS  Left Ventricle: Left ventricular ejection fraction, by estimation, is 65 to 70%. The left ventricle has normal function. The left ventricle has no regional wall motion abnormalities. The average left ventricular global longitudinal strain is -19.7 %. The global longitudinal strain is normal. The left ventricular internal cavity size was normal in size. There is no left ventricular hypertrophy. Left ventricular diastolic parameters were normal. Right Ventricle: The right ventricular size is normal. No increase in right ventricular wall thickness. Right ventricular systolic function is  normal. There is normal pulmonary artery systolic pressure. The tricuspid regurgitant velocity is 2.44 m/s, and  with an assumed right atrial pressure of 3 mmHg, the estimated right ventricular systolic pressure is 86.7 mmHg. Left Atrium: Left atrial size was mildly dilated. Right Atrium: Right atrial size was normal in size. Pericardium: Trivial pericardial effusion is present. Mitral Valve: The mitral valve is degenerative in appearance. Mild mitral annular calcification. Trivial mitral valve regurgitation. No evidence of mitral valve stenosis. Tricuspid Valve: The tricuspid valve is grossly normal. Tricuspid valve regurgitation is mild . No evidence of tricuspid stenosis. Aortic Valve: The aortic valve is tricuspid. . There is mild thickening and mild calcification of the aortic valve. Aortic valve regurgitation is not visualized. Mild aortic valve sclerosis is present, with no evidence of aortic valve stenosis. There is mild thickening of the aortic valve. There is mild calcification of the aortic valve. Pulmonic Valve: The pulmonic valve was grossly normal. Pulmonic valve regurgitation is not visualized. No evidence of pulmonic stenosis. Aorta: The aortic root and ascending aorta are structurally normal, with no evidence of dilitation. Venous: The inferior vena cava is normal in size with greater than 50% respiratory variability, suggesting right atrial pressure of 3 mmHg. IAS/Shunts: The atrial septum is grossly normal.  LEFT VENTRICLE PLAX 2D LVIDd:         4.70 cm  Diastology LVIDs:         3.20 cm  LV e' lateral:   5.55 cm/s LV PW:         1.10 cm  LV E/e' lateral: 11.9 LV IVS:        1.20 cm  LV e' medial:    4.79 cm/s LVOT diam:     2.00 cm  LV E/e' medial:  13.8 LV SV:         61 LV SV Index:   36       2D Longitudinal Strain LVOT Area:     3.14 cm 2D Strain GLS Avg:     -19.7 %  RIGHT VENTRICLE RV S prime:     11.70 cm/s TAPSE (M-mode): 2.4 cm LEFT ATRIUM             Index       RIGHT ATRIUM            Index LA diam:        3.40 cm 2.03 cm/m  RA Area:     13.70 cm LA Vol (A2C):   70.5 ml 42.07 ml/m RA Volume:   28.00 ml  16.71 ml/m LA Vol (A4C):   43.8 ml 26.14 ml/m LA Biplane Vol: 57.0 ml 34.02 ml/m  AORTIC VALVE LVOT Vmax:   80.90 cm/s LVOT Vmean:  53.700 cm/s LVOT VTI:    0.194 m  AORTA Ao  Root diam: 3.10 cm Ao Asc diam:  3.70 cm MITRAL VALVE               TRICUSPID VALVE MV Area (PHT): 2.73 cm    TR Peak grad:   23.8 mmHg MV Decel Time: 278 msec    TR Vmax:        244.00 cm/s MV E velocity: 66.00 cm/s MV A velocity: 73.70 cm/s  SHUNTS MV E/A ratio:  0.90        Systemic VTI:  0.19 m                            Systemic Diam: 2.00 cm Cynthia Chiquito MD Electronically signed by Cynthia Chiquito MD Signature Date/Time: 02/08/2020/8:05:26 PM    Final      ELIGIBLE FOR AVAILABLE RESEARCH PROTOCOL: AET  ASSESSMENT: 74 y.o. [place] woman status post left breast upper outer quadrant biopsy 11/17/2019 for a clinical T2 N1, stage IIA invasive ductal carcinoma, grade 2, estrogen and progesterone receptor positive, HER-2 not amplified, with an MIB-1 of 30%.  (1) status post left lumpectomy and axillary lymph node dissection 01/12/2020 for a pT2 pN2, stage IIIA invasive ductal carcinoma, grade 3, with negative margins  (a) a total of 14 axillary lymph nodes removed, 8 positive, with extracapsular extension  (2) adjuvant chemotherapy will consist of doxorubicin and cyclophosphamide in dose dense fashion x4 starting 02/13/2020, followed by weekly paclitaxel x12  (a) echocardiogram 02/08/2020 showed an ejection fraction in the 65/70%  (b) final 2 cycles of doxorubicin/cyclophosphamide given 21 days apart  (3) adjuvant radiation to follow  (4) antiestrogens to start at the completion of local treatment   PLAN: Ronesha tolerated her second cycle of AC much less well in the first.  Of course the shock of losing her hair is part of it.  In addition she feels faint likely due to autonomic effects from the  chemotherapy.  I have asked her to hold her hydrochlorothiazide and losartan for a week and call us a week from now with her blood pressures. I also encouraged her to continue to hydrate herself well.  She will take Cipro for the next 5 days and she will call us if she develops a temperature greater than 100.  I am changing her chemotherapy to every 21 days and I am dose reducing it by 18% or so for the last 2 AC cycles.  We will then proceed to the paclitaxel as planned.  Unfortunately her taste alteration is going to continue.  We discussed several maneuvers she can try to "full" her taste buds into making the food more palatable  She knows to call for any other issue that may develop before the next visit  Total encounter time 30 minutes.Sarajane Jews C. Calley Drenning, MD 03/05/2020 6:18 PM Medical Oncology and Hematology [facility] [address] [place], [place] [address] Tel. [phone]    Fax. [phone]   I, Wilburn Mylar, am acting as scribe for Dr. Virgie Dad. Bain Whichard.  I, Lurline Del MD, have reviewed the above documentation for accuracy and completeness, and I agree with the above.   *Total Encounter Time as defined by the Centers for Medicare and Medicaid Services includes, in addition to the face-to-face time of a patient visit (documented in the note above) non-face-to-face time: obtaining and reviewing outside history, ordering and reviewing medications, tests or procedures, care coordination (communications with other health care professionals or caregivers)  and documentation in the medical record.

## 2020-03-05 ENCOUNTER — Inpatient Hospital Stay: Payer: Medicare HMO

## 2020-03-05 ENCOUNTER — Other Ambulatory Visit: Payer: Self-pay

## 2020-03-05 ENCOUNTER — Telehealth: Payer: Self-pay

## 2020-03-05 ENCOUNTER — Inpatient Hospital Stay: Payer: Medicare HMO | Attending: Oncology | Admitting: Oncology

## 2020-03-05 VITALS — BP 111/52 | HR 79 | Temp 98.5°F | Resp 18 | Ht 59.0 in | Wt 157.3 lb

## 2020-03-05 DIAGNOSIS — Z8249 Family history of ischemic heart disease and other diseases of the circulatory system: Secondary | ICD-10-CM | POA: Insufficient documentation

## 2020-03-05 DIAGNOSIS — Z818 Family history of other mental and behavioral disorders: Secondary | ICD-10-CM | POA: Diagnosis not present

## 2020-03-05 DIAGNOSIS — R55 Syncope and collapse: Secondary | ICD-10-CM | POA: Diagnosis not present

## 2020-03-05 DIAGNOSIS — Z95828 Presence of other vascular implants and grafts: Secondary | ICD-10-CM

## 2020-03-05 DIAGNOSIS — Z79899 Other long term (current) drug therapy: Secondary | ICD-10-CM | POA: Insufficient documentation

## 2020-03-05 DIAGNOSIS — Z5189 Encounter for other specified aftercare: Secondary | ICD-10-CM | POA: Diagnosis not present

## 2020-03-05 DIAGNOSIS — Z170 Estrogen receptor positive status [ER+]: Secondary | ICD-10-CM | POA: Diagnosis not present

## 2020-03-05 DIAGNOSIS — K219 Gastro-esophageal reflux disease without esophagitis: Secondary | ICD-10-CM | POA: Insufficient documentation

## 2020-03-05 DIAGNOSIS — Z5111 Encounter for antineoplastic chemotherapy: Secondary | ICD-10-CM | POA: Insufficient documentation

## 2020-03-05 DIAGNOSIS — C50412 Malignant neoplasm of upper-outer quadrant of left female breast: Secondary | ICD-10-CM | POA: Insufficient documentation

## 2020-03-05 DIAGNOSIS — I10 Essential (primary) hypertension: Secondary | ICD-10-CM | POA: Insufficient documentation

## 2020-03-05 DIAGNOSIS — Z87442 Personal history of urinary calculi: Secondary | ICD-10-CM | POA: Insufficient documentation

## 2020-03-05 DIAGNOSIS — C773 Secondary and unspecified malignant neoplasm of axilla and upper limb lymph nodes: Secondary | ICD-10-CM | POA: Insufficient documentation

## 2020-03-05 DIAGNOSIS — Z803 Family history of malignant neoplasm of breast: Secondary | ICD-10-CM | POA: Diagnosis not present

## 2020-03-05 LAB — CBC WITH DIFFERENTIAL/PLATELET
Abs Immature Granulocytes: 0 10*3/uL (ref 0.00–0.07)
Basophils Absolute: 0 10*3/uL (ref 0.0–0.1)
Basophils Relative: 3 %
Eosinophils Absolute: 0 10*3/uL (ref 0.0–0.5)
Eosinophils Relative: 3 %
HCT: 30.8 % — ABNORMAL LOW (ref 36.0–46.0)
Hemoglobin: 10.5 g/dL — ABNORMAL LOW (ref 12.0–15.0)
Immature Granulocytes: 0 %
Lymphocytes Relative: 38 %
Lymphs Abs: 0.1 10*3/uL — ABNORMAL LOW (ref 0.7–4.0)
MCH: 30 pg (ref 26.0–34.0)
MCHC: 34.1 g/dL (ref 30.0–36.0)
MCV: 88 fL (ref 80.0–100.0)
Monocytes Absolute: 0.1 10*3/uL (ref 0.1–1.0)
Monocytes Relative: 34 %
Neutro Abs: 0.1 10*3/uL — CL (ref 1.7–7.7)
Neutrophils Relative %: 22 %
Platelets: 235 10*3/uL (ref 150–400)
RBC: 3.5 MIL/uL — ABNORMAL LOW (ref 3.87–5.11)
RDW: 13.5 % (ref 11.5–15.5)
WBC: 0.3 10*3/uL — CL (ref 4.0–10.5)
nRBC: 0 % (ref 0.0–0.2)

## 2020-03-05 LAB — COMPREHENSIVE METABOLIC PANEL
ALT: 13 U/L (ref 0–44)
AST: 13 U/L — ABNORMAL LOW (ref 15–41)
Albumin: 3.7 g/dL (ref 3.5–5.0)
Alkaline Phosphatase: 76 U/L (ref 38–126)
Anion gap: 9 (ref 5–15)
BUN: 22 mg/dL (ref 8–23)
CO2: 24 mmol/L (ref 22–32)
Calcium: 9.7 mg/dL (ref 8.9–10.3)
Chloride: 102 mmol/L (ref 98–111)
Creatinine, Ser: 1.19 mg/dL — ABNORMAL HIGH (ref 0.44–1.00)
GFR calc Af Amer: 52 mL/min — ABNORMAL LOW (ref >60)
GFR calc non Af Amer: 45 mL/min — ABNORMAL LOW (ref >60)
Glucose, Bld: 107 mg/dL — ABNORMAL HIGH (ref 70–99)
Potassium: 4.3 mmol/L (ref 3.5–5.1)
Sodium: 135 mmol/L (ref 135–145)
Total Bilirubin: 0.6 mg/dL (ref 0.3–1.2)
Total Protein: 6.5 g/dL (ref 6.5–8.1)

## 2020-03-05 MED ORDER — HEPARIN SOD (PORK) LOCK FLUSH 100 UNIT/ML IV SOLN
500.0000 [IU] | Freq: Once | INTRAVENOUS | Status: AC
  Administered 2020-03-05: 500 [IU]
  Filled 2020-03-05: qty 5

## 2020-03-05 MED ORDER — SODIUM CHLORIDE 0.9% FLUSH
10.0000 mL | Freq: Once | INTRAVENOUS | Status: AC
  Administered 2020-03-05: 10 mL
  Filled 2020-03-05: qty 10

## 2020-03-05 NOTE — Patient Instructions (Signed)

## 2020-03-05 NOTE — Telephone Encounter (Signed)
Critical Value: WBC: 0.3 ANC: 0.1 Dr. Jana Hakim notified

## 2020-03-06 ENCOUNTER — Telehealth: Payer: Self-pay | Admitting: Oncology

## 2020-03-06 NOTE — Telephone Encounter (Signed)
Scheduled appts per 8/3 los. Left voicemail with next appt date and time.

## 2020-03-12 ENCOUNTER — Ambulatory Visit: Payer: Medicare HMO

## 2020-03-12 ENCOUNTER — Other Ambulatory Visit: Payer: Medicare HMO

## 2020-03-12 ENCOUNTER — Ambulatory Visit: Payer: Medicare HMO | Admitting: Adult Health

## 2020-03-19 NOTE — Progress Notes (Signed)
[place]  [phone] Fax:([phone]     ID: Cynthia Vasquez DOB: May 06, 1946  MR#: [id number]  DJS#:[id number]  Patient Care Team: Sueanne Margarita, DO as PCP - General (Internal Medicine) Rockwell Germany, RN as Oncology Nurse Navigator Mauro Kaufmann, RN as Oncology Nurse Navigator Jourden Delmont, Virgie Dad, MD as Consulting Physician (Oncology) Alphonsa Overall, MD as Consulting Physician (General Surgery) Kyung Rudd, MD as Consulting Physician (Radiation Oncology) Wilford Corner, MD as Consulting Physician (Gastroenterology) Chauncey Cruel, MD OTHER MD:  CHIEF COMPLAINT: Estrogen receptor positive breast cancer  CURRENT TREATMENT: adjuvant chemotherapy    INTERVAL HISTORY: Cynthia Vasquez returns today for follow-up and treatment of her estrogen receptor positive breast cancer.   She started adjuvant chemotherapy consisting of doxorubicin and cyclophosphamide on 02/13/2020. Given her difficulties with cycle 2, we changed this to be given every 21 days at a dose reduction of 18%. Today is day 1 cycle 3 of AC.   Her most recent echocardiogram from 02/08/2020 showed an ejection fraction of 65-70%.  Lab Results  Component Value Date   CA2729 10.4 02/13/2020    REVIEW OF SYSTEMS: Cynthia Vasquez feels much better with her "extra week".  She says she has more energy, and she is doing more activities including walking up and down stairs and of course all her housework.  Her sense of taste is better.  A detailed review of systems was otherwise noncontributory   HISTORY OF CURRENT ILLNESS: From the original intake note:  Cynthia Vasquez herself palpated a left breast mass, with associated dimpling, tenderness, and pinching pain. She underwent bilateral diagnostic mammography with tomography and left breast ultrasonography at [facility] on 11/09/2019 showing: breast density category C; 2.9 cm irregular mass in left breast at 2:30 corresponding to palpable abnormality; three  enlarged left axillary lymph nodes.  Accordingly on 11/17/2019 she proceeded to biopsy of the left breast area in question. The pathology from this procedure ([id number]) showed: invasive mammary carcinoma, grade 2, e-cadherin positive. Prognostic indicators significant for: estrogen receptor, 95% positive and progesterone receptor, 95% positive, both with strong staining intensity. Proliferation marker Ki67 at 30%. HER2 negative by immunohistochemistry (1+).  The biopsied lymph node was positive for metastatic carcinoma.  The patient's subsequent history is as detailed below.   PAST MEDICAL HISTORY: Past Medical History:  Diagnosis Date  . Atrial septal defect    repaired age 47  . Cancer ([place]) 01/2020   IDC left breast  . GERD (gastroesophageal reflux disease)   . Glaucoma   . Headache    migraines until age 18- 59, have them 3-4 x year  . History of blood transfusion    with ASD surgery at age 89  . History of kidney stones    passed stones  . Hypertension   . Vitamin D deficiency     PAST SURGICAL HISTORY: Past Surgical History:  Procedure Laterality Date  . ASD REPAIR     age 12  . BREAST LUMPECTOMY WITH RADIOACTIVE SEED AND AXILLARY LYMPH NODE DISSECTION Left 01/12/2020   Procedure: LEFT BREAST LUMPECTOMY WITH RADIOACTIVE SEED AND LEFT AXILLARY LYMPH NODE DISSECTION;  Surgeon: Alphonsa Overall, MD;  Location: [place];  Service: General;  Laterality: Left;  PEC BLOCK  . COLONOSCOPY    . PORTACATH PLACEMENT Right 02/02/2020   Procedure: INSERTION PORT-A-CATH WITH ULTRASOUND GUIDANCE;  Surgeon: Alphonsa Overall, MD;  Location: [place];  Service: General;  Laterality: Right;  . UPPER GI ENDOSCOPY  FAMILY HISTORY: Family History  Problem Relation Age of Onset  . Dementia Mother   . Heart attack Father   . Hypertension Father   . Breast cancer Sister 30  The patient's father died at age 54 from a myocardial infarction.  The patient's mother died at 25 with  Alzheimer's disease.  The patient has 2 brothers, 2 sisters.  1 sister developed breast cancer at the age of 75.  The patient's daughter has a history of thyroid cancer.  She has not been genetically tested.  There is no other history of cancer in the family to the patient's knowledge.   GYNECOLOGIC HISTORY:  No LMP recorded (lmp unknown). Patient is postmenopausal. Menarche: 74 years old Age at first live birth: 74 years old [place] P 3 LMP age 44 Contraceptive approximately 2 years, with no complications HRT no  Hysterectomy?  No BSO?  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  She is now retired.  Her husband Cynthia Vasquez (goes by [employer]") is also a retired Optometrist.  Son Cynthia Vasquez 63 lives in [place] and works for a company that processes [employer] for [employer], but he completed a masters in Chief Executive Officer and is looking for a different job; daughter Cynthia Vasquez lives in [place] and is a Oncologist; son Cynthia Vasquez lives in [place] and runs a business that puts and tennis on cell towers.  The patient has 8 grandchildren.  She attends our [employer].    ADVANCED DIRECTIVES: In the absence of any documents to the contrary the patient's husband is her healthcare power of attorney   HEALTH MAINTENANCE: Social History   Tobacco Use  . Smoking status: Never Smoker  . Smokeless tobacco: Never Used  Vaping Use  . Vaping Use: Never used  Substance Use Topics  . Alcohol use: No  . Drug use: No     Colonoscopy: "Less than 10 years ago"; [facility]  PAP: Remote  Bone density: Never   No Known Allergies  Current Outpatient Medications  Medication Sig Dispense Refill  . acetaminophen (TYLENOL) 500 MG tablet Take 500 mg by mouth every 6 (six) hours as needed for mild pain.     Marland Kitchen acetaminophen (TYLENOL) 500 MG tablet Take 1 tablet (500 mg total) by mouth every 8 (eight) hours as needed. Take with aleve 220 md 60 tablet 0   . b complex vitamins tablet Take 1 tablet by mouth 3 (three) times a week.    . calcium gluconate 500 MG tablet Take 1 tablet (500 mg total) by mouth 3 (three) times daily.    . cholecalciferol (VITAMIN D) 1000 UNITS tablet Take 1,000 Units by mouth daily.    . ciprofloxacin (CIPRO) 500 MG tablet Take 1 tablet (500 mg total) by mouth 2 (two) times daily. Start day 8 after chemotherapy, take one tablet twice a day for five days then stop. Repeat next chemo cycle. 40 tablet 0  . dexamethasone (DECADRON) 4 MG tablet Counting chemo day as day 1, take 2 tablets by mouth twice a day on days 2 and 3 then in AM day 4, then stop 30 tablet 1  . hydrochlorothiazide (HYDRODIURIL) 25 MG tablet Take 25 mg by mouth daily.     Marland Kitchen latanoprost (XALATAN) 0.005 % ophthalmic solution Place 1 drop into both eyes at bedtime.    . lidocaine-prilocaine (EMLA) cream Apply to affected area once 30 g 3  . LORazepam (ATIVAN) 0.5 MG  tablet Take 1 tablet (0.5 mg total) by mouth at bedtime as needed (Nausea or vomiting). 20 tablet 0  . losartan (COZAAR) 50 MG tablet Take 50 mg by mouth daily.     . Multiple Vitamins-Minerals (MULTIVITAMIN WITH MINERALS) tablet Take 1 tablet by mouth daily.    . naproxen sodium (ALEVE) 220 MG tablet Take 1 tablet (220 mg total) by mouth every 8 (eight) hours as needed. Take with tylenol 500 mg. 60 tablet 3  . ondansetron (ZOFRAN) 8 MG tablet Take 1 tablet (8 mg total) by mouth every 8 (eight) hours as needed for nausea or vomiting. Start on day 3 after chemo if needed 20 tablet 0  . pantoprazole (PROTONIX) 40 MG tablet Take 40 mg by mouth every other day.     . prochlorperazine (COMPAZINE) 10 MG tablet Take 1 tablet (10 mg total) by mouth 4 (four) times daily -  before meals and at bedtime. Star the evening of chemotherapy and continue for 3 days 30 tablet 1  . promethazine (PHENERGAN) 25 MG tablet Take 1 tablet (25 mg total) by mouth every 8 (eight) hours as needed for nausea or vomiting. 30 tablet  0  . traMADol (ULTRAM) 50 MG tablet Take 1-2 tablets (50-100 mg total) by mouth every 6 (six) hours as needed. 60 tablet 0   No current facility-administered medications for this visit.    OBJECTIVE: White woman in no acute distress  Vitals:   03/20/20 1221  BP: (!) 127/49  Pulse: 85  Resp: 17  Temp: 97.6 F (36.4 C)  SpO2: 98%   Wt Readings from Last 3 Encounters:  03/20/20 157 lb 9.6 oz (71.5 kg)  03/05/20 157 lb 4.8 oz (71.4 kg)  02/27/20 160 lb 4.8 oz (72.7 kg)   Body mass index is 31.83 kg/m.    ECOG FS:1 - Symptomatic but completely ambulatory  Sclerae unicteric, EOMs intact Wearing a mask No cervical or supraclavicular adenopathy Lungs no rales or rhonchi Heart regular rate and rhythm Abd soft, nontender, positive bowel sounds MSK no focal spinal tenderness, no upper extremity lymphedema Neuro: nonfocal, well oriented, appropriate affect Breasts: Deferred   LAB RESULTS:  CMP     Component Value Date/Time   NA 135 03/05/2020 0838   K 4.3 03/05/2020 0838   CL 102 03/05/2020 0838   CO2 24 03/05/2020 0838   GLUCOSE 107 (H) 03/05/2020 0838   BUN 22 03/05/2020 0838   CREATININE 1.19 (H) 03/05/2020 0838   CREATININE 1.06 (H) 11/29/2019 0845   CALCIUM 9.7 03/05/2020 0838   PROT 6.5 03/05/2020 0838   ALBUMIN 3.7 03/05/2020 0838   AST 13 (L) 03/05/2020 0838   AST 23 11/29/2019 0845   ALT 13 03/05/2020 0838   ALT 17 11/29/2019 0845   ALKPHOS 76 03/05/2020 0838   BILITOT 0.6 03/05/2020 0838   BILITOT 0.6 11/29/2019 0845   GFRNONAA 45 (L) 03/05/2020 0838   GFRNONAA 52 (L) 11/29/2019 0845   GFRAA 52 (L) 03/05/2020 0838   GFRAA >60 11/29/2019 0845    No results found for: TOTALPROTELP, ALBUMINELP, A1GS, A2GS, BETS, BETA2SER, GAMS, MSPIKE, SPEI  Lab Results  Component Value Date   WBC 9.7 03/20/2020   NEUTROABS 6.4 03/20/2020   HGB 10.3 (L) 03/20/2020   HCT 30.5 (L) 03/20/2020   MCV 89.4 03/20/2020   PLT 430 (H) 03/20/2020    No results found for:  LABCA2  No components found for: DXAJOI786  No results for input(s): INR in the last 168  hours.  No results found for: LABCA2  No results found for: GTX646  No results found for: OEH212  No results found for: YQM250  Lab Results  Component Value Date   CA2729 10.4 02/13/2020    No components found for: HGQUANT  No results found for: CEA1 / No results found for: CEA1   No results found for: AFPTUMOR  No results found for: CHROMOGRNA  No results found for: KPAFRELGTCHN, LAMBDASER, KAPLAMBRATIO (kappa/lambda light chains)  No results found for: HGBA, HGBA2QUANT, HGBFQUANT, HGBSQUAN (Hemoglobinopathy evaluation)   Lab Results  Component Value Date   LDH 230 (H) 06/28/2019    No results found for: IRON, TIBC, IRONPCTSAT (Iron and TIBC)  Lab Results  Component Value Date   FERRITIN 626 (H) 07/03/2019    Urinalysis    Component Value Date/Time   COLORURINE YELLOW 06/11/2013 2007   APPEARANCEUR CLEAR 06/11/2013 2007   LABSPEC 1.017 06/11/2013 2007   PHURINE 6.5 06/11/2013 2007   GLUCOSEU NEGATIVE 06/11/2013 2007   HGBUR NEGATIVE 06/11/2013 2007   [place] NEGATIVE 06/11/2013 2007   [place] NEGATIVE 06/11/2013 2007   PROTEINUR NEGATIVE 06/11/2013 2007   UROBILINOGEN 0.2 06/11/2013 2007   NITRITE NEGATIVE 06/11/2013 2007   LEUKOCYTESUR NEGATIVE 06/11/2013 2007     STUDIES: No results found.   ELIGIBLE FOR AVAILABLE RESEARCH PROTOCOL: AET  ASSESSMENT: 74 y.o. [place] woman status post left breast upper outer quadrant biopsy 11/17/2019 for a clinical T2 N1, stage IIA invasive ductal carcinoma, grade 2, estrogen and progesterone receptor positive, HER-2 not amplified, with an MIB-1 of 30%.  (1) status post left lumpectomy and axillary lymph node dissection 01/12/2020 for a pT2 pN2, stage IIIA invasive ductal carcinoma, grade 3, with negative margins  (a) a total of 14 axillary lymph nodes removed, 8 positive, with extracapsular extension  (2)  adjuvant chemotherapy will consist of doxorubicin and cyclophosphamide in dose dense fashion x4 starting 02/13/2020, followed by weekly paclitaxel x12  (a) echocardiogram 02/08/2020 showed an ejection fraction in the 65/70%  (b) final 2 cycles of doxorubicin/cyclophosphamide given 21 days apart and 18% dose reduced  (3) adjuvant radiation to follow  (4) antiestrogens to start at the completion of local treatment   PLAN: Shellie has picked up some steam and is ready to proceed to her final 2 cycles of AC which she will receive at 18% dose reduction.  She will also receive them at 3 weeks apart.  I am also going to give her 3 weeks after her fourth cycle of AC before starting her weekly paclitaxel.  That will be on April 30, 2020.  She will see me that day.  If she decides to have the booster shot for Covid which I recommend she has a can do it a week after the current treatment or a week after the fourth cycle.  She knows to call for any other issue that may develop before the next visit  Total encounter time 20 minutes.Sarajane Jews C. Chanson Teems, MD 03/20/2020 12:39 PM Medical Oncology and Hematology [facility] [place], [place] [address] Tel. [phone]    Fax. [phone]   I, Wilburn Mylar, am acting as scribe for Dr. Virgie Dad. Alfonzia Woolum.  I, Lurline Del MD, have reviewed the above documentation for accuracy and completeness, and I agree with the above.   *Total Encounter Time as defined by the Centers for Medicare and Medicaid Services includes, in addition to the face-to-face time of a patient visit (documented in the  note above) non-face-to-face time: obtaining and reviewing outside history, ordering and reviewing medications, tests or procedures, care coordination (communications with other health care professionals or caregivers) and documentation in the medical record.

## 2020-03-20 ENCOUNTER — Inpatient Hospital Stay (HOSPITAL_BASED_OUTPATIENT_CLINIC_OR_DEPARTMENT_OTHER): Payer: Medicare HMO | Admitting: Oncology

## 2020-03-20 ENCOUNTER — Inpatient Hospital Stay: Payer: Medicare HMO

## 2020-03-20 ENCOUNTER — Other Ambulatory Visit: Payer: Self-pay

## 2020-03-20 VITALS — BP 127/49 | HR 85 | Temp 97.6°F | Resp 17 | Ht 59.0 in | Wt 157.6 lb

## 2020-03-20 DIAGNOSIS — Z170 Estrogen receptor positive status [ER+]: Secondary | ICD-10-CM

## 2020-03-20 DIAGNOSIS — C50412 Malignant neoplasm of upper-outer quadrant of left female breast: Secondary | ICD-10-CM | POA: Diagnosis not present

## 2020-03-20 DIAGNOSIS — C773 Secondary and unspecified malignant neoplasm of axilla and upper limb lymph nodes: Secondary | ICD-10-CM | POA: Diagnosis not present

## 2020-03-20 DIAGNOSIS — Z5189 Encounter for other specified aftercare: Secondary | ICD-10-CM | POA: Diagnosis not present

## 2020-03-20 DIAGNOSIS — Z5111 Encounter for antineoplastic chemotherapy: Secondary | ICD-10-CM | POA: Diagnosis not present

## 2020-03-20 DIAGNOSIS — I10 Essential (primary) hypertension: Secondary | ICD-10-CM | POA: Diagnosis not present

## 2020-03-20 DIAGNOSIS — K219 Gastro-esophageal reflux disease without esophagitis: Secondary | ICD-10-CM | POA: Diagnosis not present

## 2020-03-20 DIAGNOSIS — Z79899 Other long term (current) drug therapy: Secondary | ICD-10-CM | POA: Diagnosis not present

## 2020-03-20 DIAGNOSIS — Z95828 Presence of other vascular implants and grafts: Secondary | ICD-10-CM

## 2020-03-20 DIAGNOSIS — R55 Syncope and collapse: Secondary | ICD-10-CM | POA: Diagnosis not present

## 2020-03-20 LAB — CBC WITH DIFFERENTIAL/PLATELET
Abs Immature Granulocytes: 0.08 10*3/uL — ABNORMAL HIGH (ref 0.00–0.07)
Basophils Absolute: 0.1 10*3/uL (ref 0.0–0.1)
Basophils Relative: 1 %
Eosinophils Absolute: 0.5 10*3/uL (ref 0.0–0.5)
Eosinophils Relative: 5 %
HCT: 30.5 % — ABNORMAL LOW (ref 36.0–46.0)
Hemoglobin: 10.3 g/dL — ABNORMAL LOW (ref 12.0–15.0)
Immature Granulocytes: 1 %
Lymphocytes Relative: 15 %
Lymphs Abs: 1.4 10*3/uL (ref 0.7–4.0)
MCH: 30.2 pg (ref 26.0–34.0)
MCHC: 33.8 g/dL (ref 30.0–36.0)
MCV: 89.4 fL (ref 80.0–100.0)
Monocytes Absolute: 1.2 10*3/uL — ABNORMAL HIGH (ref 0.1–1.0)
Monocytes Relative: 13 %
Neutro Abs: 6.4 10*3/uL (ref 1.7–7.7)
Neutrophils Relative %: 65 %
Platelets: 430 10*3/uL — ABNORMAL HIGH (ref 150–400)
RBC: 3.41 MIL/uL — ABNORMAL LOW (ref 3.87–5.11)
RDW: 14.6 % (ref 11.5–15.5)
WBC: 9.7 10*3/uL (ref 4.0–10.5)
nRBC: 0 % (ref 0.0–0.2)

## 2020-03-20 LAB — COMPREHENSIVE METABOLIC PANEL
ALT: 16 U/L (ref 0–44)
AST: 21 U/L (ref 15–41)
Albumin: 3.8 g/dL (ref 3.5–5.0)
Alkaline Phosphatase: 58 U/L (ref 38–126)
Anion gap: 9 (ref 5–15)
BUN: 25 mg/dL — ABNORMAL HIGH (ref 8–23)
CO2: 26 mmol/L (ref 22–32)
Calcium: 9.9 mg/dL (ref 8.9–10.3)
Chloride: 104 mmol/L (ref 98–111)
Creatinine, Ser: 1.12 mg/dL — ABNORMAL HIGH (ref 0.44–1.00)
GFR calc Af Amer: 56 mL/min — ABNORMAL LOW (ref >60)
GFR calc non Af Amer: 49 mL/min — ABNORMAL LOW (ref >60)
Glucose, Bld: 123 mg/dL — ABNORMAL HIGH (ref 70–99)
Potassium: 3.8 mmol/L (ref 3.5–5.1)
Sodium: 139 mmol/L (ref 135–145)
Total Bilirubin: 0.4 mg/dL (ref 0.3–1.2)
Total Protein: 7 g/dL (ref 6.5–8.1)

## 2020-03-20 MED ORDER — SODIUM CHLORIDE 0.9 % IV SOLN
10.0000 mg | Freq: Once | INTRAVENOUS | Status: AC
  Administered 2020-03-20: 10 mg via INTRAVENOUS
  Filled 2020-03-20: qty 10

## 2020-03-20 MED ORDER — SODIUM CHLORIDE 0.9 % IV SOLN
150.0000 mg | Freq: Once | INTRAVENOUS | Status: AC
  Administered 2020-03-20: 150 mg via INTRAVENOUS
  Filled 2020-03-20: qty 150

## 2020-03-20 MED ORDER — DEXAMETHASONE 4 MG PO TABS: ORAL_TABLET | ORAL | 1 refills | Status: DC

## 2020-03-20 MED ORDER — SODIUM CHLORIDE 0.9 % IV SOLN
500.0000 mg/m2 | Freq: Once | INTRAVENOUS | Status: AC
  Administered 2020-03-20: 880 mg via INTRAVENOUS
  Filled 2020-03-20: qty 44

## 2020-03-20 MED ORDER — HEPARIN SOD (PORK) LOCK FLUSH 100 UNIT/ML IV SOLN
500.0000 [IU] | Freq: Once | INTRAVENOUS | Status: AC | PRN
  Administered 2020-03-20: 500 [IU]
  Filled 2020-03-20: qty 5

## 2020-03-20 MED ORDER — SODIUM CHLORIDE 0.9 % IV SOLN
Freq: Once | INTRAVENOUS | Status: AC
  Filled 2020-03-20: qty 250

## 2020-03-20 MED ORDER — PROCHLORPERAZINE MALEATE 10 MG PO TABS: 10.0000 mg | ORAL_TABLET | Freq: Three times a day (TID) | ORAL | 1 refills | Status: DC

## 2020-03-20 MED ORDER — CIPROFLOXACIN HCL 500 MG PO TABS
500.0000 mg | ORAL_TABLET | Freq: Two times a day (BID) | ORAL | 0 refills | Status: DC
Start: 2020-03-20 — End: 2020-05-07

## 2020-03-20 MED ORDER — SODIUM CHLORIDE 0.9% FLUSH
10.0000 mL | Freq: Once | INTRAVENOUS | Status: AC
  Administered 2020-03-20: 10 mL
  Filled 2020-03-20: qty 10

## 2020-03-20 MED ORDER — PALONOSETRON HCL INJECTION 0.25 MG/5ML
0.2500 mg | Freq: Once | INTRAVENOUS | Status: AC
  Administered 2020-03-20: 0.25 mg via INTRAVENOUS

## 2020-03-20 MED ORDER — SODIUM CHLORIDE 0.9% FLUSH
10.0000 mL | INTRAVENOUS | Status: DC | PRN
  Administered 2020-03-20: 10 mL
  Filled 2020-03-20: qty 10

## 2020-03-20 MED ORDER — PALONOSETRON HCL INJECTION 0.25 MG/5ML
INTRAVENOUS | Status: AC
  Filled 2020-03-20: qty 5

## 2020-03-20 MED ORDER — DOXORUBICIN HCL CHEMO IV INJECTION 2 MG/ML
50.0000 mg/m2 | Freq: Once | INTRAVENOUS | Status: AC
  Administered 2020-03-20: 88 mg via INTRAVENOUS
  Filled 2020-03-20: qty 44

## 2020-03-20 NOTE — Patient Instructions (Signed)
[facility] Discharge Instructions for Patients Receiving Chemotherapy  Today you received the following chemotherapy agents Doxorubicin, Cytoxan.  To help prevent nausea and vomiting after your treatment, we encourage you to take your nausea medication. DO NOT TAKE ZOFRAN FOR THREE DAYS AFTER TREATMENT.   If you develop nausea and vomiting that is not controlled by your nausea medication, call the clinic.   BELOW ARE SYMPTOMS THAT SHOULD BE REPORTED IMMEDIATELY:  *FEVER GREATER THAN 100.5 F  *CHILLS WITH OR WITHOUT FEVER  NAUSEA AND VOMITING THAT IS NOT CONTROLLED WITH YOUR NAUSEA MEDICATION  *UNUSUAL SHORTNESS OF BREATH  *UNUSUAL BRUISING OR BLEEDING  TENDERNESS IN MOUTH AND THROAT WITH OR WITHOUT PRESENCE OF ULCERS  *URINARY PROBLEMS  *BOWEL PROBLEMS  UNUSUAL RASH Items with * indicate a potential emergency and should be followed up as soon as possible.  Feel free to call the clinic should you have any questions or concerns. The clinic phone number is [phone].  Please show the CHEMO ALERT CARD at check-in to the Emergency Department and triage nurse.   

## 2020-03-22 ENCOUNTER — Inpatient Hospital Stay: Payer: Medicare HMO

## 2020-03-22 ENCOUNTER — Other Ambulatory Visit: Payer: Self-pay

## 2020-03-22 VITALS — BP 132/62 | HR 74 | Temp 98.1°F | Resp 18

## 2020-03-22 DIAGNOSIS — C773 Secondary and unspecified malignant neoplasm of axilla and upper limb lymph nodes: Secondary | ICD-10-CM | POA: Diagnosis not present

## 2020-03-22 DIAGNOSIS — Z79899 Other long term (current) drug therapy: Secondary | ICD-10-CM | POA: Diagnosis not present

## 2020-03-22 DIAGNOSIS — R55 Syncope and collapse: Secondary | ICD-10-CM | POA: Diagnosis not present

## 2020-03-22 DIAGNOSIS — C50412 Malignant neoplasm of upper-outer quadrant of left female breast: Secondary | ICD-10-CM | POA: Diagnosis not present

## 2020-03-22 DIAGNOSIS — Z170 Estrogen receptor positive status [ER+]: Secondary | ICD-10-CM

## 2020-03-22 DIAGNOSIS — Z5111 Encounter for antineoplastic chemotherapy: Secondary | ICD-10-CM | POA: Diagnosis not present

## 2020-03-22 DIAGNOSIS — Z5189 Encounter for other specified aftercare: Secondary | ICD-10-CM | POA: Diagnosis not present

## 2020-03-22 DIAGNOSIS — I10 Essential (primary) hypertension: Secondary | ICD-10-CM | POA: Diagnosis not present

## 2020-03-22 DIAGNOSIS — K219 Gastro-esophageal reflux disease without esophagitis: Secondary | ICD-10-CM | POA: Diagnosis not present

## 2020-03-22 MED ORDER — PEGFILGRASTIM-JMDB 6 MG/0.6ML ~~LOC~~ SOSY
6.0000 mg | PREFILLED_SYRINGE | Freq: Once | SUBCUTANEOUS | Status: AC
  Administered 2020-03-22: 6 mg via SUBCUTANEOUS

## 2020-03-22 MED ORDER — PEGFILGRASTIM-JMDB 6 MG/0.6ML ~~LOC~~ SOSY
PREFILLED_SYRINGE | SUBCUTANEOUS | Status: AC
  Filled 2020-03-22: qty 0.6

## 2020-03-22 NOTE — Patient Instructions (Signed)

## 2020-03-25 ENCOUNTER — Encounter: Payer: Self-pay | Admitting: *Deleted

## 2020-03-26 ENCOUNTER — Other Ambulatory Visit: Payer: Medicare HMO

## 2020-03-26 ENCOUNTER — Ambulatory Visit: Payer: Medicare HMO

## 2020-03-26 ENCOUNTER — Ambulatory Visit: Payer: Medicare HMO | Admitting: Nurse Practitioner

## 2020-04-03 DIAGNOSIS — U071 COVID-19: Secondary | ICD-10-CM | POA: Diagnosis not present

## 2020-04-10 ENCOUNTER — Other Ambulatory Visit: Payer: Self-pay | Admitting: Hematology and Oncology

## 2020-04-10 ENCOUNTER — Other Ambulatory Visit: Payer: Self-pay

## 2020-04-10 ENCOUNTER — Inpatient Hospital Stay: Payer: Medicare HMO

## 2020-04-10 ENCOUNTER — Encounter: Payer: Self-pay | Admitting: *Deleted

## 2020-04-10 ENCOUNTER — Inpatient Hospital Stay: Payer: Medicare HMO | Attending: Oncology

## 2020-04-10 ENCOUNTER — Inpatient Hospital Stay (HOSPITAL_BASED_OUTPATIENT_CLINIC_OR_DEPARTMENT_OTHER): Payer: Medicare HMO | Admitting: Medical

## 2020-04-10 VITALS — BP 156/49 | HR 80 | Temp 97.8°F | Resp 20 | Ht 59.0 in | Wt 155.2 lb

## 2020-04-10 DIAGNOSIS — R590 Localized enlarged lymph nodes: Secondary | ICD-10-CM | POA: Diagnosis not present

## 2020-04-10 DIAGNOSIS — Z803 Family history of malignant neoplasm of breast: Secondary | ICD-10-CM | POA: Insufficient documentation

## 2020-04-10 DIAGNOSIS — Z87442 Personal history of urinary calculi: Secondary | ICD-10-CM | POA: Insufficient documentation

## 2020-04-10 DIAGNOSIS — Z170 Estrogen receptor positive status [ER+]: Secondary | ICD-10-CM

## 2020-04-10 DIAGNOSIS — Z5189 Encounter for other specified aftercare: Secondary | ICD-10-CM | POA: Diagnosis not present

## 2020-04-10 DIAGNOSIS — Z8249 Family history of ischemic heart disease and other diseases of the circulatory system: Secondary | ICD-10-CM | POA: Insufficient documentation

## 2020-04-10 DIAGNOSIS — Z5111 Encounter for antineoplastic chemotherapy: Secondary | ICD-10-CM | POA: Diagnosis not present

## 2020-04-10 DIAGNOSIS — R5383 Other fatigue: Secondary | ICD-10-CM | POA: Diagnosis not present

## 2020-04-10 DIAGNOSIS — Z7952 Long term (current) use of systemic steroids: Secondary | ICD-10-CM | POA: Insufficient documentation

## 2020-04-10 DIAGNOSIS — R519 Headache, unspecified: Secondary | ICD-10-CM | POA: Diagnosis not present

## 2020-04-10 DIAGNOSIS — I10 Essential (primary) hypertension: Secondary | ICD-10-CM | POA: Diagnosis not present

## 2020-04-10 DIAGNOSIS — M79606 Pain in leg, unspecified: Secondary | ICD-10-CM | POA: Diagnosis not present

## 2020-04-10 DIAGNOSIS — C50412 Malignant neoplasm of upper-outer quadrant of left female breast: Secondary | ICD-10-CM

## 2020-04-10 DIAGNOSIS — Z79899 Other long term (current) drug therapy: Secondary | ICD-10-CM | POA: Diagnosis not present

## 2020-04-10 DIAGNOSIS — K219 Gastro-esophageal reflux disease without esophagitis: Secondary | ICD-10-CM | POA: Diagnosis not present

## 2020-04-10 DIAGNOSIS — Z818 Family history of other mental and behavioral disorders: Secondary | ICD-10-CM | POA: Diagnosis not present

## 2020-04-10 LAB — CBC WITH DIFFERENTIAL/PLATELET
Abs Immature Granulocytes: 0.02 10*3/uL (ref 0.00–0.07)
Basophils Absolute: 0.1 10*3/uL (ref 0.0–0.1)
Basophils Relative: 2 %
Eosinophils Absolute: 0.7 10*3/uL — ABNORMAL HIGH (ref 0.0–0.5)
Eosinophils Relative: 12 %
HCT: 27.8 % — ABNORMAL LOW (ref 36.0–46.0)
Hemoglobin: 9.4 g/dL — ABNORMAL LOW (ref 12.0–15.0)
Immature Granulocytes: 0 %
Lymphocytes Relative: 15 %
Lymphs Abs: 0.9 10*3/uL (ref 0.7–4.0)
MCH: 30.1 pg (ref 26.0–34.0)
MCHC: 33.8 g/dL (ref 30.0–36.0)
MCV: 89.1 fL (ref 80.0–100.0)
Monocytes Absolute: 0.8 10*3/uL (ref 0.1–1.0)
Monocytes Relative: 13 %
Neutro Abs: 3.4 10*3/uL (ref 1.7–7.7)
Neutrophils Relative %: 58 %
Platelets: 382 10*3/uL (ref 150–400)
RBC: 3.12 MIL/uL — ABNORMAL LOW (ref 3.87–5.11)
RDW: 15.2 % (ref 11.5–15.5)
WBC: 5.9 10*3/uL (ref 4.0–10.5)
nRBC: 0 % (ref 0.0–0.2)

## 2020-04-10 LAB — COMPREHENSIVE METABOLIC PANEL
ALT: 17 U/L (ref 0–44)
AST: 19 U/L (ref 15–41)
Albumin: 3.8 g/dL (ref 3.5–5.0)
Alkaline Phosphatase: 48 U/L (ref 38–126)
Anion gap: 6 (ref 5–15)
BUN: 18 mg/dL (ref 8–23)
CO2: 28 mmol/L (ref 22–32)
Calcium: 9.9 mg/dL (ref 8.9–10.3)
Chloride: 105 mmol/L (ref 98–111)
Creatinine, Ser: 0.85 mg/dL (ref 0.44–1.00)
GFR calc Af Amer: >60 mL/min (ref >60)
GFR calc non Af Amer: >60 mL/min (ref >60)
Glucose, Bld: 83 mg/dL (ref 70–99)
Potassium: 4 mmol/L (ref 3.5–5.1)
Sodium: 139 mmol/L (ref 135–145)
Total Bilirubin: 0.4 mg/dL (ref 0.3–1.2)
Total Protein: 6.6 g/dL (ref 6.5–8.1)

## 2020-04-10 MED ORDER — DOXORUBICIN HCL CHEMO IV INJECTION 2 MG/ML
50.0000 mg/m2 | Freq: Once | INTRAVENOUS | Status: AC
  Administered 2020-04-10: 88 mg via INTRAVENOUS
  Filled 2020-04-10: qty 44

## 2020-04-10 MED ORDER — SODIUM CHLORIDE 0.9 % IV SOLN
500.0000 mg/m2 | Freq: Once | INTRAVENOUS | Status: AC
  Administered 2020-04-10: 880 mg via INTRAVENOUS
  Filled 2020-04-10: qty 44

## 2020-04-10 MED ORDER — HEPARIN SOD (PORK) LOCK FLUSH 100 UNIT/ML IV SOLN
500.0000 [IU] | Freq: Once | INTRAVENOUS | Status: AC | PRN
  Administered 2020-04-10: 500 [IU]
  Filled 2020-04-10: qty 5

## 2020-04-10 MED ORDER — PALONOSETRON HCL INJECTION 0.25 MG/5ML
INTRAVENOUS | Status: AC
  Filled 2020-04-10: qty 5

## 2020-04-10 MED ORDER — SODIUM CHLORIDE 0.9 % IV SOLN
Freq: Once | INTRAVENOUS | Status: AC
  Filled 2020-04-10: qty 250

## 2020-04-10 MED ORDER — SODIUM CHLORIDE 0.9 % IV SOLN
150.0000 mg | Freq: Once | INTRAVENOUS | Status: AC
  Administered 2020-04-10: 150 mg via INTRAVENOUS
  Filled 2020-04-10: qty 150

## 2020-04-10 MED ORDER — SODIUM CHLORIDE 0.9% FLUSH
10.0000 mL | INTRAVENOUS | Status: DC | PRN
  Administered 2020-04-10: 10 mL
  Filled 2020-04-10: qty 10

## 2020-04-10 MED ORDER — PALONOSETRON HCL INJECTION 0.25 MG/5ML
0.2500 mg | Freq: Once | INTRAVENOUS | Status: AC
  Administered 2020-04-10: 0.25 mg via INTRAVENOUS

## 2020-04-10 MED ORDER — SODIUM CHLORIDE 0.9 % IV SOLN
10.0000 mg | Freq: Once | INTRAVENOUS | Status: AC
  Administered 2020-04-10: 10 mg via INTRAVENOUS
  Filled 2020-04-10: qty 10

## 2020-04-10 NOTE — Patient Instructions (Signed)
[facility] Discharge Instructions for Patients Receiving Chemotherapy  Today you received the following chemotherapy agents Doxorubicin, Cytoxan.  To help prevent nausea and vomiting after your treatment, we encourage you to take your nausea medication. DO NOT TAKE ZOFRAN FOR THREE DAYS AFTER TREATMENT.   If you develop nausea and vomiting that is not controlled by your nausea medication, call the clinic.   BELOW ARE SYMPTOMS THAT SHOULD BE REPORTED IMMEDIATELY:  *FEVER GREATER THAN 100.5 F  *CHILLS WITH OR WITHOUT FEVER  NAUSEA AND VOMITING THAT IS NOT CONTROLLED WITH YOUR NAUSEA MEDICATION  *UNUSUAL SHORTNESS OF BREATH  *UNUSUAL BRUISING OR BLEEDING  TENDERNESS IN MOUTH AND THROAT WITH OR WITHOUT PRESENCE OF ULCERS  *URINARY PROBLEMS  *BOWEL PROBLEMS  UNUSUAL RASH Items with * indicate a potential emergency and should be followed up as soon as possible.  Feel free to call the clinic should you have any questions or concerns. The clinic phone number is [phone].  Please show the CHEMO ALERT CARD at check-in to the Emergency Department and triage nurse.   

## 2020-04-10 NOTE — Progress Notes (Signed)
[facility] Progress Note   Cynthia Vasquez [id number] Jun 27, 1946 74 y.o.  Cynthia Vasquez is managed by Dr. Lurline Del  Actively treated with chemotherapy/immunotherapy/hormonal therapy: yes  Current therapy: Adriamycin and Cytoxan with Fulphila support  Last treated:   03/20/2020 (cycle 3, day 1)  Next scheduled appointment with provider: 04/30/2020  Assessment: Plan:    Malignant neoplasm of upper-outer quadrant of left breast in female, estrogen receptor positive ([place])   ER positive malignant neoplasm of the left breast: Ms. Feld continues to be followed by Dr. Jana Hakim.  She presents for consideration of cycle 3, day 1 of Adriamycin and Cytoxan with Fulphila support.  She will return for follow-up on 04/30/2020.  Please see After Visit Summary for patient specific instructions.  Future Appointments  Date Time Provider [place]  04/30/2020  9:15 AM [facility] LAB [facility] None  04/30/2020  9:30 AM [facility] FLUSH [facility] None  04/30/2020 10:00 AM Magrinat, Virgie Dad, MD [facility] None  04/30/2020 11:00 AM [facility] INFUSION [facility] None    No orders of the defined types were placed in this encounter.      Subjective:   Patient ID:  Cynthia Vasquez is a 74 y.o. (DOB [birth year]) female.  Chief Complaint: No chief complaint on file.   HPI Cynthia Vasquez  is a 74 y.o. female with a diagnosis of an ER positive malignant neoplasm of the left breast.  She is followed by Dr. Jana Hakim and presents for consideration of cycle 3, day 1 of Adriamycin and Cytoxan with Fulphila support.  She reports fatigue and headaches lately which are relieved with her use of Tylenol.  She reports anorexia around the time of her treatment and reports occasional dizziness.  This is not new.  She denies fevers, chills, sweats, nausea, vomiting, constipation, or diarrhea   Medications: I have reviewed the patient's current medications.  Allergies:  No Known Allergies  Past Medical History:  Diagnosis Date  . Atrial septal defect    repaired age 66  . Cancer ([place]) 01/2020   IDC left breast  . GERD (gastroesophageal reflux disease)   . Glaucoma   . Headache    migraines until age 30- 37, have them 3-4 x year  . History of blood transfusion    with ASD surgery at age 74  . History of kidney stones    passed stones  . Hypertension   . Vitamin D deficiency     Past Surgical History:  Procedure Laterality Date  . ASD REPAIR     age 54  . BREAST LUMPECTOMY WITH RADIOACTIVE SEED AND AXILLARY LYMPH NODE DISSECTION Left 01/12/2020   Procedure: LEFT BREAST LUMPECTOMY WITH RADIOACTIVE SEED AND LEFT AXILLARY LYMPH NODE DISSECTION;  Surgeon: Alphonsa Overall, MD;  Location: [place];  Service: General;  Laterality: Left;  PEC BLOCK  . COLONOSCOPY    . PORTACATH PLACEMENT Right 02/02/2020   Procedure: INSERTION PORT-A-CATH WITH ULTRASOUND GUIDANCE;  Surgeon: Alphonsa Overall, MD;  Location: [place];  Service: General;  Laterality: Right;  . UPPER GI ENDOSCOPY      Family History  Problem Relation Age of Onset  . Dementia Mother   . Heart attack Father   . Hypertension Father   . Breast cancer Sister 24    Social History   Socioeconomic History  . Marital status: Married    Spouse name: [name]  . Number of children: 3  . Years of education: 2  . Highest education  level: Not on file  Occupational History  . Occupation: Pharmacist, hospital    Comment: retired  Tobacco Use  . Smoking status: Never Smoker  . Smokeless tobacco: Never Used  Vaping Use  . Vaping Use: Never used  Substance and Sexual Activity  . Alcohol use: No  . Drug use: No  . Sexual activity: Not on file  Other Topics Concern  . Not on file  Social History Narrative   Lives at home with husband   Caffeine use- green tea 1-2 glasses a day   Social Determinants of Health   Financial Resource Strain:   . Difficulty of Paying Living Expenses: Not on file   Food Insecurity:   . Worried About Charity fundraiser in the Last Year: Not on file  . Ran Out of Food in the Last Year: Not on file  Transportation Needs:   . Lack of Transportation (Medical): Not on file  . Lack of Transportation (Non-Medical): Not on file  Physical Activity:   . Days of Exercise per Week: Not on file  . Minutes of Exercise per Session: Not on file  Stress:   . Feeling of Stress : Not on file  Social Connections:   . Frequency of Communication with Friends and Family: Not on file  . Frequency of Social Gatherings with Friends and Family: Not on file  . Attends Religious Services: Not on file  . Active Member of Clubs or Organizations: Not on file  . Attends Archivist Meetings: Not on file  . Marital Status: Not on file  Intimate Partner Violence:   . Fear of Current or Ex-Partner: Not on file  . Emotionally Abused: Not on file  . Physically Abused: Not on file  . Sexually Abused: Not on file    Past Medical History, Surgical history, Social history, and Family history were reviewed and updated as appropriate.   Please see review of systems for further details on the patient's review from today.   Review of Systems:  Review of Systems  Constitutional: Positive for activity change and fatigue. Negative for chills, diaphoresis and fever.  HENT: Negative for trouble swallowing and voice change.   Respiratory: Negative for cough, chest tightness, shortness of breath and wheezing.   Cardiovascular: Negative for chest pain and palpitations.  Gastrointestinal: Negative for abdominal pain, constipation, diarrhea, nausea and vomiting.  Musculoskeletal: Negative for back pain and myalgias.  Neurological: Positive for dizziness and headaches. Negative for light-headedness.    Objective:   Physical Exam:  BP (!) 156/49 (BP Location: Left Arm, Patient Position: Sitting)   Pulse 80   Temp 97.8 F (36.6 C) (Tympanic)   Resp 20   Ht 4\' 11"  (1.499 m)    Wt 155 lb 3.2 oz (70.4 kg)   LMP  (LMP Unknown)   SpO2 97%   BMI 31.35 kg/m  ECOG: 0  Physical Exam Constitutional:      General: She is not in acute distress.    Appearance: She is not diaphoretic.  HENT:     Head: Normocephalic and atraumatic.  Eyes:     General: No scleral icterus.       Right eye: No discharge.        Left eye: No discharge.     Conjunctiva/sclera: Conjunctivae normal.  Cardiovascular:     Rate and Rhythm: Normal rate and regular rhythm.     Heart sounds: Normal heart sounds. No murmur heard.  No friction rub. No gallop.  Pulmonary:     Effort: Pulmonary effort is normal. No respiratory distress.     Breath sounds: Normal breath sounds. No wheezing or rales.  Abdominal:     General: Bowel sounds are normal. There is no distension.     Tenderness: There is no abdominal tenderness. There is no guarding.  Skin:    General: Skin is warm and dry.     Findings: No erythema or rash.  Neurological:     Mental Status: She is alert.     Coordination: Coordination normal.     Gait: Gait normal.     Lab Review:     Component Value Date/Time   NA 139 04/10/2020 1125   K 4.0 04/10/2020 1125   CL 105 04/10/2020 1125   CO2 28 04/10/2020 1125   GLUCOSE 83 04/10/2020 1125   BUN 18 04/10/2020 1125   CREATININE 0.85 04/10/2020 1125   CREATININE 1.06 (H) 11/29/2019 0845   CALCIUM 9.9 04/10/2020 1125   PROT 6.6 04/10/2020 1125   ALBUMIN 3.8 04/10/2020 1125   AST 19 04/10/2020 1125   AST 23 11/29/2019 0845   ALT 17 04/10/2020 1125   ALT 17 11/29/2019 0845   ALKPHOS 48 04/10/2020 1125   BILITOT 0.4 04/10/2020 1125   BILITOT 0.6 11/29/2019 0845   GFRNONAA >60 04/10/2020 1125   GFRNONAA 52 (L) 11/29/2019 0845   GFRAA >60 04/10/2020 1125   GFRAA >60 11/29/2019 0845       Component Value Date/Time   WBC 5.9 04/10/2020 1125   RBC 3.12 (L) 04/10/2020 1125   HGB 9.4 (L) 04/10/2020 1125   HGB 12.9 11/29/2019 0845   HCT 27.8 (L) 04/10/2020 1125   PLT 382  04/10/2020 1125   PLT 357 11/29/2019 0845   MCV 89.1 04/10/2020 1125   MCH 30.1 04/10/2020 1125   MCHC 33.8 04/10/2020 1125   RDW 15.2 04/10/2020 1125   LYMPHSABS 0.9 04/10/2020 1125   MONOABS 0.8 04/10/2020 1125   EOSABS 0.7 (H) 04/10/2020 1125   BASOSABS 0.1 04/10/2020 1125   -------------------------------  Imaging from last 24 hours (if applicable):  Radiology interpretation: No results found.    OK to treat  Sandi Mealy, MHS, PA-C

## 2020-04-12 ENCOUNTER — Inpatient Hospital Stay: Payer: Medicare HMO

## 2020-04-12 ENCOUNTER — Other Ambulatory Visit: Payer: Self-pay

## 2020-04-12 VITALS — BP 158/72 | HR 78 | Temp 98.0°F | Resp 18

## 2020-04-12 DIAGNOSIS — Z170 Estrogen receptor positive status [ER+]: Secondary | ICD-10-CM | POA: Diagnosis not present

## 2020-04-12 DIAGNOSIS — I10 Essential (primary) hypertension: Secondary | ICD-10-CM | POA: Diagnosis not present

## 2020-04-12 DIAGNOSIS — R519 Headache, unspecified: Secondary | ICD-10-CM | POA: Diagnosis not present

## 2020-04-12 DIAGNOSIS — R590 Localized enlarged lymph nodes: Secondary | ICD-10-CM | POA: Diagnosis not present

## 2020-04-12 DIAGNOSIS — C50412 Malignant neoplasm of upper-outer quadrant of left female breast: Secondary | ICD-10-CM | POA: Diagnosis not present

## 2020-04-12 DIAGNOSIS — Z5111 Encounter for antineoplastic chemotherapy: Secondary | ICD-10-CM | POA: Diagnosis not present

## 2020-04-12 DIAGNOSIS — K219 Gastro-esophageal reflux disease without esophagitis: Secondary | ICD-10-CM | POA: Diagnosis not present

## 2020-04-12 DIAGNOSIS — R5383 Other fatigue: Secondary | ICD-10-CM | POA: Diagnosis not present

## 2020-04-12 DIAGNOSIS — Z5189 Encounter for other specified aftercare: Secondary | ICD-10-CM | POA: Diagnosis not present

## 2020-04-12 MED ORDER — PEGFILGRASTIM-JMDB 6 MG/0.6ML ~~LOC~~ SOSY
6.0000 mg | PREFILLED_SYRINGE | Freq: Once | SUBCUTANEOUS | Status: AC
  Administered 2020-04-12: 6 mg via SUBCUTANEOUS

## 2020-04-12 MED ORDER — PEGFILGRASTIM-JMDB 6 MG/0.6ML ~~LOC~~ SOSY
PREFILLED_SYRINGE | SUBCUTANEOUS | Status: AC
  Filled 2020-04-12: qty 0.6

## 2020-04-12 NOTE — Patient Instructions (Signed)

## 2020-04-24 NOTE — Progress Notes (Signed)
Pharmacist Chemotherapy Monitoring - Initial Assessment    Anticipated start date: 04/30/20   Regimen:  . Are orders appropriate based on the patient's diagnosis, regimen, and cycle? Yes . Does the plan date match the patient's scheduled date? Yes . Is the sequencing of drugs appropriate? Yes . Are the premedications appropriate for the patient's regimen? Yes . Prior Authorization for treatment is: Pending o If applicable, is the correct biosimilar selected based on the patient's insurance? not applicable  Organ Function and Labs: Marland Kitchen Are dose adjustments needed based on the patient's renal function, hepatic function, or hematologic function? No . Are appropriate labs ordered prior to the start of patient's treatment? Yes . Other organ system assessment, if indicated: N/A . The following baseline labs, if indicated, have been ordered: N/A  Dose Assessment: . Are the drug doses appropriate? Yes . Are the following correct: o Drug concentrations Yes o IV fluid compatible with drug Yes o Administration routes Yes o Timing of therapy Yes . If applicable, does the patient have documented access for treatment and/or plans for port-a-cath placement? not applicable . If applicable, have lifetime cumulative doses been properly documented and assessed? not applicable Lifetime Dose Tracking  . Doxorubicin: 224.168 mg/m2 (388 mg) = 49.82 % of the maximum lifetime dose of 450 mg/m2  o   Toxicity Monitoring/Prevention: . The patient has the following take home antiemetics prescribed: N/A . The patient has the following take home medications prescribed: N/A . Medication allergies and previous infusion related reactions, if applicable, have been reviewed and addressed. Yes . The patient's current medication list has been assessed for drug-drug interactions with their chemotherapy regimen. no significant drug-drug interactions were identified on review.  Order Review: . Are the treatment plan  orders signed? Yes . Is the patient scheduled to see a provider prior to their treatment? Yes  I verify that I have reviewed each item in the above checklist and answered each question accordingly.  Exodus Kutzer K 04/24/2020 10:10 AM

## 2020-04-29 ENCOUNTER — Encounter: Payer: Self-pay | Admitting: *Deleted

## 2020-04-29 NOTE — Progress Notes (Signed)
Cynthia Vasquez  [phone] [phone]     ID: Cynthia Vasquez DOB: 11-18-1945  MR#: [id number]  BVQ#:[id number]  Patient Care Team: Sueanne Margarita, DO as PCP - General (Internal Medicine) Rockwell Germany, RN as Oncology Nurse Navigator Mauro Kaufmann, RN as Oncology Nurse Navigator Magrinat, Cynthia Dad, MD as Consulting Physician (Oncology) Alphonsa Overall, MD as Consulting Physician (General Surgery) Kyung Rudd, MD as Consulting Physician (Radiation Oncology) Wilford Corner, MD as Consulting Physician (Gastroenterology) Chauncey Cruel, MD OTHER MD:  CHIEF COMPLAINT: Estrogen receptor positive breast cancer  CURRENT TREATMENT: adjuvant chemotherapy    INTERVAL HISTORY: Cynthia Vasquez returns today for follow-up and treatment of her estrogen receptor positive breast cancer.   She completed 4 cycles of AC on 04/10/2020.  She receives these every 21 days.  It was fairly rough.  There is no evidence however of permanent or endorgan damage.  She is here today to begin weekly paclitaxel, which she will receive x12.  Her most recent echocardiogram from 02/08/2020 showed an ejection fraction of 65-70%.  Lab Results  Component Value Date   CA2729 10.4 02/13/2020    REVIEW OF SYSTEMS: Cynthia Vasquez is getting pretty tired of not having hair.  She also has taste alteration in taste perversion.  After the last bag fill gastrium she had significant leg pain.  She tried some creams for that and that was helpful.  Today her port did not draw back which is a concern.  Aside from these issues a detailed review of systems today was stable   HISTORY OF CURRENT ILLNESS: From the original intake note:  Issa herself palpated a left breast mass, with associated dimpling, tenderness, and pinching pain. She underwent bilateral diagnostic mammography with tomography and left breast ultrasonography at [facility] on 11/09/2019 showing: breast density category C; 2.9 cm irregular  mass in left breast at 2:30 corresponding to palpable abnormality; three enlarged left axillary lymph nodes.  Accordingly on 11/17/2019 she proceeded to biopsy of the left breast area in question. The pathology from this procedure ([id number]) showed: invasive mammary carcinoma, grade 2, e-cadherin positive. Prognostic indicators significant for: estrogen receptor, 95% positive and progesterone receptor, 95% positive, both with strong staining intensity. Proliferation marker Ki67 at 30%. HER2 negative by immunohistochemistry (1+).  The biopsied lymph node was positive for metastatic carcinoma.  The patient's subsequent history is as detailed below.   PAST MEDICAL HISTORY: Past Medical History:  Diagnosis Date  . Atrial septal defect    repaired age 47  . Cancer ([place]) 01/2020   IDC left breast  . GERD (gastroesophageal reflux disease)   . Glaucoma   . Headache    migraines until age 69- 68, have them 3-4 x year  . History of blood transfusion    with ASD surgery at age 78  . History of kidney stones    passed stones  . Hypertension   . Vitamin D deficiency     PAST SURGICAL HISTORY: Past Surgical History:  Procedure Laterality Date  . ASD REPAIR     age 18  . BREAST LUMPECTOMY WITH RADIOACTIVE SEED AND AXILLARY LYMPH NODE DISSECTION Left 01/12/2020   Procedure: LEFT BREAST LUMPECTOMY WITH RADIOACTIVE SEED AND LEFT AXILLARY LYMPH NODE DISSECTION;  Surgeon: Alphonsa Overall, MD;  Location: [facility];  Service: General;  Laterality: Left;  PEC BLOCK  . COLONOSCOPY    . PORTACATH PLACEMENT Right 02/02/2020   Procedure: INSERTION PORT-A-CATH WITH ULTRASOUND GUIDANCE;  Surgeon: Alphonsa Overall, MD;  Location: [facility];  Service: General;  Laterality: Right;  . UPPER GI ENDOSCOPY      FAMILY HISTORY: Family History  Problem Relation Age of Onset  . Dementia Mother   . Heart attack Father   . Hypertension Father   . Breast cancer Sister 31  The patient's father died at  age 71 from a myocardial infarction.  The patient's mother died at 59 with Alzheimer's disease.  The patient has 2 brothers, 2 sisters.  1 sister developed breast cancer at the age of 51.  The patient's daughter has a history of thyroid cancer.  She has not been genetically tested.  There is no other history of cancer in the family to the patient's knowledge.   GYNECOLOGIC HISTORY:  No LMP recorded (lmp unknown). Patient is postmenopausal. Menarche: 74 years old Age at first live birth: 74 years old [place] P 3 LMP age 60 Contraceptive approximately 2 years, with no complications HRT no  Hysterectomy?  No BSO?  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  She is now retired.  Her husband Cynthia Vasquez (goes by [employer]") is also a retired Optometrist.  Son Cynthia Vasquez 47 lives in [place] and works for a company that processes [employer] for [employer], but he completed a masters in Chief Executive Officer and is looking for a different job; daughter Cynthia Vasquez lives in [place] and is a Oncologist; son Cynthia Vasquez lives in [place] and runs a business that puts and tennis on cell towers.  The patient has 8 grandchildren.  She attends our [employer].    ADVANCED DIRECTIVES: In the absence of any documents to the contrary the patient's husband is her healthcare power of attorney   HEALTH MAINTENANCE: Social History   Tobacco Use  . Smoking status: Never Smoker  . Smokeless tobacco: Never Used  Vaping Use  . Vaping Use: Never used  Substance Use Topics  . Alcohol use: No  . Drug use: No     Colonoscopy: "Less than 10 years ago"; [facility]  PAP: Remote  Bone density: Never   No Known Allergies  Current Outpatient Medications  Medication Sig Dispense Refill  . acetaminophen (TYLENOL) 500 MG tablet Take 500 mg by mouth every 6 (six) hours as needed for mild pain.     Marland Kitchen acetaminophen (TYLENOL) 500 MG tablet Take 1 tablet (500 mg total)  by mouth every 8 (eight) hours as needed. Take with aleve 220 md 60 tablet 0  . b complex vitamins tablet Take 1 tablet by mouth 3 (three) times a week.    . calcium gluconate 500 MG tablet Take 1 tablet (500 mg total) by mouth 3 (three) times daily.    . cholecalciferol (VITAMIN D) 1000 UNITS tablet Take 1,000 Units by mouth daily.    . ciprofloxacin (CIPRO) 500 MG tablet Take 1 tablet (500 mg total) by mouth 2 (two) times daily. Start day 8 after chemotherapy, take one tablet twice a day for five days then stop. Repeat next chemo cycle. 40 tablet 0  . dexamethasone (DECADRON) 4 MG tablet Counting chemo day as day 1, take 2 tablets by mouth twice a day on days 2 and 3 then in AM day 4, then stop 30 tablet 1  . hydrochlorothiazide (HYDRODIURIL) 25 MG tablet Take 25 mg by mouth daily.     Marland Kitchen latanoprost (XALATAN) 0.005 % ophthalmic solution Place 1 drop into both eyes at bedtime.    Marland Kitchen  lidocaine-prilocaine (EMLA) cream Apply to affected area once 30 g 3  . LORazepam (ATIVAN) 0.5 MG tablet Take 1 tablet (0.5 mg total) by mouth at bedtime as needed (Nausea or vomiting). 20 tablet 0  . losartan (COZAAR) 50 MG tablet Take 50 mg by mouth daily.     . Multiple Vitamins-Minerals (MULTIVITAMIN WITH MINERALS) tablet Take 1 tablet by mouth daily.    . naproxen sodium (ALEVE) 220 MG tablet Take 1 tablet (220 mg total) by mouth every 8 (eight) hours as needed. Take with tylenol 500 mg. 60 tablet 3  . ondansetron (ZOFRAN) 8 MG tablet Take 1 tablet (8 mg total) by mouth every 8 (eight) hours as needed for nausea or vomiting. Start on day 3 after chemo if needed 20 tablet 0  . pantoprazole (PROTONIX) 40 MG tablet Take 40 mg by mouth every other day.     . prochlorperazine (COMPAZINE) 10 MG tablet Take 1 tablet (10 mg total) by mouth 4 (four) times daily -  before meals and at bedtime. Star the evening of chemotherapy and continue for 3 days 30 tablet 1  . promethazine (PHENERGAN) 25 MG tablet Take 1 tablet (25 mg  total) by mouth every 8 (eight) hours as needed for nausea or vomiting. 30 tablet 0  . traMADol (ULTRAM) 50 MG tablet Take 1-2 tablets (50-100 mg total) by mouth every 6 (six) hours as needed. 60 tablet 0   No current facility-administered medications for this visit.    OBJECTIVE: White woman who appears stated age  34:   04/30/20 1046  BP: (!) 115/59  Pulse: 74  Resp: 17  Temp: (!) 97 F (36.1 C)  SpO2: 97%   Wt Readings from Last 3 Encounters:  04/30/20 150 lb 3.2 oz (68.1 kg)  04/10/20 155 lb 3.2 oz (70.4 kg)  03/20/20 157 lb 9.6 oz (71.5 kg)   Body mass index is 30.34 kg/m.    ECOG FS:1 - Symptomatic but completely ambulatory  Sclerae unicteric, EOMs intact Wearing a mask No cervical or supraclavicular adenopathy Lungs no rales or rhonchi Heart regular rate and rhythm Abd soft, nontender, positive bowel sounds MSK no focal spinal tenderness, no upper extremity lymphedema Neuro: nonfocal, well oriented, appropriate affect Breasts: Deferred     LAB RESULTS:  CMP     Component Value Date/Time   NA 140 04/30/2020 1020   K 3.8 04/30/2020 1020   CL 103 04/30/2020 1020   CO2 29 04/30/2020 1020   GLUCOSE 88 04/30/2020 1020   BUN 19 04/30/2020 1020   CREATININE 1.09 (H) 04/30/2020 1020   CREATININE 1.06 (H) 11/29/2019 0845   CALCIUM 10.0 04/30/2020 1020   PROT 7.3 04/30/2020 1020   ALBUMIN 4.1 04/30/2020 1020   AST 22 04/30/2020 1020   AST 23 11/29/2019 0845   ALT 17 04/30/2020 1020   ALT 17 11/29/2019 0845   ALKPHOS 52 04/30/2020 1020   BILITOT 0.4 04/30/2020 1020   BILITOT 0.6 11/29/2019 0845   GFRNONAA 50 (L) 04/30/2020 1020   GFRNONAA 52 (L) 11/29/2019 0845   GFRAA 58 (L) 04/30/2020 1020   GFRAA >60 11/29/2019 0845    No results found for: TOTALPROTELP, ALBUMINELP, A1GS, A2GS, BETS, BETA2SER, GAMS, MSPIKE, SPEI  Lab Results  Component Value Date   WBC 8.1 04/30/2020   NEUTROABS 6.1 04/30/2020   HGB 9.3 (L) 04/30/2020   HCT 27.9 (L)  04/30/2020   MCV 92.1 04/30/2020   PLT 503 (H) 04/30/2020    No results found  for: LABCA2  No components found for: GHWEXH371  No results for input(s): INR in the last 168 hours.  No results found for: LABCA2  No results found for: IRC789  No results found for: FYB017  No results found for: PZW258  Lab Results  Component Value Date   CA2729 10.4 02/13/2020    No components found for: HGQUANT  No results found for: CEA1 / No results found for: CEA1   No results found for: AFPTUMOR  No results found for: CHROMOGRNA  No results found for: KPAFRELGTCHN, LAMBDASER, KAPLAMBRATIO (kappa/lambda light chains)  No results found for: HGBA, HGBA2QUANT, HGBFQUANT, HGBSQUAN (Hemoglobinopathy evaluation)   Lab Results  Component Value Date   LDH 230 (H) 06/28/2019    No results found for: IRON, TIBC, IRONPCTSAT (Iron and TIBC)  Lab Results  Component Value Date   FERRITIN 626 (H) 07/03/2019    Urinalysis    Component Value Date/Time   COLORURINE YELLOW 06/11/2013 2007   APPEARANCEUR CLEAR 06/11/2013 2007   LABSPEC 1.017 06/11/2013 2007   PHURINE 6.5 06/11/2013 2007   GLUCOSEU NEGATIVE 06/11/2013 2007   HGBUR NEGATIVE 06/11/2013 2007   [place] NEGATIVE 06/11/2013 2007   [place] NEGATIVE 06/11/2013 2007   PROTEINUR NEGATIVE 06/11/2013 2007   UROBILINOGEN 0.2 06/11/2013 2007   NITRITE NEGATIVE 06/11/2013 2007   LEUKOCYTESUR NEGATIVE 06/11/2013 2007     STUDIES: No results found.   ELIGIBLE FOR AVAILABLE RESEARCH PROTOCOL: AET  ASSESSMENT: 74 y.o. [place] woman status post left breast upper outer quadrant biopsy 11/17/2019 for a clinical T2 N1, stage IIA invasive ductal carcinoma, grade 2, estrogen and progesterone receptor positive, HER-2 not amplified, with an MIB-1 of 30%.  (1) status post left lumpectomy and axillary lymph node dissection 01/12/2020 for a pT2 pN2, stage IIIA invasive ductal carcinoma, grade 3, with negative margins  (a) a total  of 14 axillary lymph nodes removed, 8 positive, with extracapsular extension  (2) adjuvant chemotherapy will consist of doxorubicin and cyclophosphamide in dose dense fashion x4 starting 02/13/2020, followed by weekly paclitaxel x12  (a) echocardiogram 02/08/2020 showed an ejection fraction in the 65/70%  (b) final 2 cycles of doxorubicin/cyclophosphamide given 21 days apart and 18% dose reduced  (3) adjuvant radiation to follow  (4) antiestrogens to start at the completion of local treatment   PLAN: Cynthia Vasquez did remarkably well with her first 4 cycles of chemo which are the most intense.  She is now about to start what we hope will be considerably easier treatments weekly.  She understands the risk of peripheral neuropathy and we discussed that at length today.  She will not need to take dexamethasone for nausea but will only take Compazine and only tonight and tomorrow morning.  After that she can take it as needed.  Her port is not working.  We are dwelling TPA today.  Hopefully I will open it up.  Otherwise she may need an IR procedure to get that opened up again.  I am going to see her again in a week just to make sure she tolerated treatment well and I asked her to keep a symptom diary to to facilitate troubleshooting  Total encounter time 25 minutes.Cynthia Jews C. Tykera Skates, MD 04/30/2020 11:11 AM Medical Oncology and Hematology [facility] [place], [place] [address] Tel. [phone]    Fax. [phone]   I, Wilburn Mylar, am acting as scribe for Dr. Virgie Vasquez. Kingson Lohmeyer.  Lindie Spruce MD, have reviewed the above  documentation for accuracy and completeness, and I agree with the above.   *Total Encounter Time as defined by the Centers for Medicare and Medicaid Services includes, in addition to the face-to-face time of a patient visit (documented in the note above) non-face-to-face time: obtaining and reviewing outside history, ordering and  reviewing medications, tests or procedures, care coordination (communications with other health care professionals or caregivers) and documentation in the medical record.

## 2020-04-30 ENCOUNTER — Inpatient Hospital Stay: Payer: Medicare HMO

## 2020-04-30 ENCOUNTER — Inpatient Hospital Stay (HOSPITAL_BASED_OUTPATIENT_CLINIC_OR_DEPARTMENT_OTHER): Payer: Medicare HMO | Admitting: Oncology

## 2020-04-30 ENCOUNTER — Other Ambulatory Visit: Payer: Self-pay

## 2020-04-30 VITALS — BP 134/66 | HR 85 | Temp 98.2°F | Resp 17

## 2020-04-30 VITALS — BP 115/59 | HR 74 | Temp 97.0°F | Resp 17 | Ht 59.0 in | Wt 150.2 lb

## 2020-04-30 DIAGNOSIS — R519 Headache, unspecified: Secondary | ICD-10-CM | POA: Diagnosis not present

## 2020-04-30 DIAGNOSIS — C50412 Malignant neoplasm of upper-outer quadrant of left female breast: Secondary | ICD-10-CM

## 2020-04-30 DIAGNOSIS — Z170 Estrogen receptor positive status [ER+]: Secondary | ICD-10-CM | POA: Diagnosis not present

## 2020-04-30 DIAGNOSIS — K219 Gastro-esophageal reflux disease without esophagitis: Secondary | ICD-10-CM | POA: Diagnosis not present

## 2020-04-30 DIAGNOSIS — Z5189 Encounter for other specified aftercare: Secondary | ICD-10-CM | POA: Diagnosis not present

## 2020-04-30 DIAGNOSIS — Z95828 Presence of other vascular implants and grafts: Secondary | ICD-10-CM

## 2020-04-30 DIAGNOSIS — I10 Essential (primary) hypertension: Secondary | ICD-10-CM | POA: Diagnosis not present

## 2020-04-30 DIAGNOSIS — Z5111 Encounter for antineoplastic chemotherapy: Secondary | ICD-10-CM | POA: Diagnosis not present

## 2020-04-30 DIAGNOSIS — R590 Localized enlarged lymph nodes: Secondary | ICD-10-CM | POA: Diagnosis not present

## 2020-04-30 DIAGNOSIS — R5383 Other fatigue: Secondary | ICD-10-CM | POA: Diagnosis not present

## 2020-04-30 LAB — CBC WITH DIFFERENTIAL/PLATELET
Abs Immature Granulocytes: 0.06 10*3/uL (ref 0.00–0.07)
Basophils Absolute: 0.1 10*3/uL (ref 0.0–0.1)
Basophils Relative: 1 %
Eosinophils Absolute: 0.3 10*3/uL (ref 0.0–0.5)
Eosinophils Relative: 4 %
HCT: 27.9 % — ABNORMAL LOW (ref 36.0–46.0)
Hemoglobin: 9.3 g/dL — ABNORMAL LOW (ref 12.0–15.0)
Immature Granulocytes: 1 %
Lymphocytes Relative: 8 %
Lymphs Abs: 0.7 10*3/uL (ref 0.7–4.0)
MCH: 30.7 pg (ref 26.0–34.0)
MCHC: 33.3 g/dL (ref 30.0–36.0)
MCV: 92.1 fL (ref 80.0–100.0)
Monocytes Absolute: 0.9 10*3/uL (ref 0.1–1.0)
Monocytes Relative: 11 %
Neutro Abs: 6.1 10*3/uL (ref 1.7–7.7)
Neutrophils Relative %: 75 %
Platelets: 503 10*3/uL — ABNORMAL HIGH (ref 150–400)
RBC: 3.03 MIL/uL — ABNORMAL LOW (ref 3.87–5.11)
RDW: 16.3 % — ABNORMAL HIGH (ref 11.5–15.5)
WBC: 8.1 10*3/uL (ref 4.0–10.5)
nRBC: 0 % (ref 0.0–0.2)

## 2020-04-30 LAB — COMPREHENSIVE METABOLIC PANEL
ALT: 17 U/L (ref 0–44)
AST: 22 U/L (ref 15–41)
Albumin: 4.1 g/dL (ref 3.5–5.0)
Alkaline Phosphatase: 52 U/L (ref 38–126)
Anion gap: 8 (ref 5–15)
BUN: 19 mg/dL (ref 8–23)
CO2: 29 mmol/L (ref 22–32)
Calcium: 10 mg/dL (ref 8.9–10.3)
Chloride: 103 mmol/L (ref 98–111)
Creatinine, Ser: 1.09 mg/dL — ABNORMAL HIGH (ref 0.44–1.00)
GFR calc Af Amer: 58 mL/min — ABNORMAL LOW (ref >60)
GFR calc non Af Amer: 50 mL/min — ABNORMAL LOW (ref >60)
Glucose, Bld: 88 mg/dL (ref 70–99)
Potassium: 3.8 mmol/L (ref 3.5–5.1)
Sodium: 140 mmol/L (ref 135–145)
Total Bilirubin: 0.4 mg/dL (ref 0.3–1.2)
Total Protein: 7.3 g/dL (ref 6.5–8.1)

## 2020-04-30 MED ORDER — FAMOTIDINE IN NACL 20-0.9 MG/50ML-% IV SOLN
INTRAVENOUS | Status: AC
  Filled 2020-04-30: qty 50

## 2020-04-30 MED ORDER — SODIUM CHLORIDE 0.9 % IV SOLN
Freq: Once | INTRAVENOUS | Status: AC
  Filled 2020-04-30: qty 250

## 2020-04-30 MED ORDER — ALTEPLASE 2 MG IJ SOLR
2.0000 mg | Freq: Once | INTRAMUSCULAR | Status: AC
  Administered 2020-04-30: 2 mg
  Filled 2020-04-30: qty 2

## 2020-04-30 MED ORDER — SODIUM CHLORIDE 0.9 % IV SOLN
80.0000 mg/m2 | Freq: Once | INTRAVENOUS | Status: AC
  Administered 2020-04-30: 138 mg via INTRAVENOUS
  Filled 2020-04-30: qty 23

## 2020-04-30 MED ORDER — DIPHENHYDRAMINE HCL 50 MG/ML IJ SOLN
INTRAMUSCULAR | Status: AC
  Filled 2020-04-30: qty 1

## 2020-04-30 MED ORDER — SODIUM CHLORIDE 0.9 % IV SOLN
20.0000 mg | Freq: Once | INTRAVENOUS | Status: AC
  Administered 2020-04-30: 20 mg via INTRAVENOUS
  Filled 2020-04-30: qty 20

## 2020-04-30 MED ORDER — LORAZEPAM 2 MG/ML IJ SOLN
0.5000 mg | Freq: Once | INTRAMUSCULAR | Status: AC
  Administered 2020-04-30: 0.5 mg via INTRAVENOUS

## 2020-04-30 MED ORDER — SODIUM CHLORIDE 0.9% FLUSH
10.0000 mL | INTRAVENOUS | Status: DC | PRN
  Administered 2020-04-30: 10 mL
  Filled 2020-04-30: qty 10

## 2020-04-30 MED ORDER — SODIUM CHLORIDE 0.9% FLUSH
10.0000 mL | Freq: Once | INTRAVENOUS | Status: AC
  Administered 2020-04-30: 10 mL
  Filled 2020-04-30: qty 10

## 2020-04-30 MED ORDER — DIPHENHYDRAMINE HCL 50 MG/ML IJ SOLN
25.0000 mg | Freq: Once | INTRAMUSCULAR | Status: AC
  Administered 2020-04-30: 25 mg via INTRAVENOUS

## 2020-04-30 MED ORDER — ALTEPLASE 2 MG IJ SOLR
INTRAMUSCULAR | Status: AC
  Filled 2020-04-30: qty 2

## 2020-04-30 MED ORDER — HEPARIN SOD (PORK) LOCK FLUSH 100 UNIT/ML IV SOLN
500.0000 [IU] | Freq: Once | INTRAVENOUS | Status: AC | PRN
  Administered 2020-04-30: 500 [IU]
  Filled 2020-04-30: qty 5

## 2020-04-30 MED ORDER — FAMOTIDINE IN NACL 20-0.9 MG/50ML-% IV SOLN
20.0000 mg | Freq: Once | INTRAVENOUS | Status: AC
  Administered 2020-04-30: 20 mg via INTRAVENOUS

## 2020-04-30 MED ORDER — LORAZEPAM 2 MG/ML IJ SOLN
INTRAMUSCULAR | Status: AC
  Filled 2020-04-30: qty 1

## 2020-04-30 NOTE — Patient Instructions (Signed)

## 2020-04-30 NOTE — Patient Instructions (Addendum)
[facility] Discharge Instructions for Patients Receiving Chemotherapy  Today you received the following chemotherapy agents: Taxol.  To help prevent nausea and vomiting after your treatment, we encourage you to take your nausea medication as directed.   If you develop nausea and vomiting that is not controlled by your nausea medication, call the clinic.   BELOW ARE SYMPTOMS THAT SHOULD BE REPORTED IMMEDIATELY:  *FEVER GREATER THAN 100.5 F  *CHILLS WITH OR WITHOUT FEVER  NAUSEA AND VOMITING THAT IS NOT CONTROLLED WITH YOUR NAUSEA MEDICATION  *UNUSUAL SHORTNESS OF BREATH  *UNUSUAL BRUISING OR BLEEDING  TENDERNESS IN MOUTH AND THROAT WITH OR WITHOUT PRESENCE OF ULCERS  *URINARY PROBLEMS  *BOWEL PROBLEMS  UNUSUAL RASH Items with * indicate a potential emergency and should be followed up as soon as possible.  Feel free to call the clinic should you have any questions or concerns. The clinic phone number is [phone].  Please show the CHEMO ALERT CARD at check-in to the Emergency Department and triage nurse.  Paclitaxel injection What is this medicine? PACLITAXEL (PAK li TAX el) is a chemotherapy drug. It targets fast dividing cells, like cancer cells, and causes these cells to die. This medicine is used to treat ovarian cancer, breast cancer, lung cancer, Kaposi's sarcoma, and other cancers. This medicine may be used for other purposes; ask your health care provider or pharmacist if you have questions. COMMON BRAND NAME(S): Onxol, Taxol What should I tell my health care provider before I take this medicine? They need to know if you have any of these conditions:  history of irregular heartbeat  liver disease  low blood counts, like low white cell, platelet, or red cell counts  lung or breathing disease, like asthma  tingling of the fingers or toes, or other nerve disorder  an unusual or allergic reaction to paclitaxel, alcohol, polyoxyethylated castor  oil, other chemotherapy, other medicines, foods, dyes, or preservatives  pregnant or trying to get pregnant  breast-feeding How should I use this medicine? This drug is given as an infusion into a vein. It is administered in a hospital or clinic by a specially trained health care professional. Talk to your pediatrician regarding the use of this medicine in children. Special care may be needed. Overdosage: If you think you have taken too much of this medicine contact a poison control center or emergency room at once. NOTE: This medicine is only for you. Do not share this medicine with others. What if I miss a dose? It is important not to miss your dose. Call your doctor or health care professional if you are unable to keep an appointment. What may interact with this medicine? Do not take this medicine with any of the following medications:  disulfiram  metronidazole This medicine may also interact with the following medications:  antiviral medicines for hepatitis, HIV or AIDS  certain antibiotics like erythromycin and clarithromycin  certain medicines for fungal infections like ketoconazole and itraconazole  certain medicines for seizures like carbamazepine, phenobarbital, phenytoin  gemfibrozil  nefazodone  rifampin  St. John's wort This list may not describe all possible interactions. Give your health care provider a list of all the medicines, herbs, non-prescription drugs, or dietary supplements you use. Also tell them if you smoke, drink alcohol, or use illegal drugs. Some items may interact with your medicine. What should I watch for while using this medicine? Your condition will be monitored carefully while you are receiving this medicine. You will need important blood work   done while you are taking this medicine. This medicine can cause serious allergic reactions. To reduce your risk you will need to take other medicine(s) before treatment with this medicine. If you  experience allergic reactions like skin rash, itching or hives, swelling of the face, lips, or tongue, tell your doctor or health care professional right away. In some cases, you may be given additional medicines to help with side effects. Follow all directions for their use. This drug may make you feel generally unwell. This is not uncommon, as chemotherapy can affect healthy cells as well as cancer cells. Report any side effects. Continue your course of treatment even though you feel ill unless your doctor tells you to stop. Call your doctor or health care professional for advice if you get a fever, chills or sore throat, or other symptoms of a cold or flu. Do not treat yourself. This drug decreases your body's ability to fight infections. Try to avoid being around people who are sick. This medicine may increase your risk to bruise or bleed. Call your doctor or health care professional if you notice any unusual bleeding. Be careful brushing and flossing your teeth or using a toothpick because you may get an infection or bleed more easily. If you have any dental work done, tell your dentist you are receiving this medicine. Avoid taking products that contain aspirin, acetaminophen, ibuprofen, naproxen, or ketoprofen unless instructed by your doctor. These medicines may hide a fever. Do not become pregnant while taking this medicine. Women should inform their doctor if they wish to become pregnant or think they might be pregnant. There is a potential for serious side effects to an unborn child. Talk to your health care professional or pharmacist for more information. Do not breast-feed an infant while taking this medicine. Men are advised not to father a child while receiving this medicine. This product may contain alcohol. Ask your pharmacist or healthcare provider if this medicine contains alcohol. Be sure to tell all healthcare providers you are taking this medicine. Certain medicines, like metronidazole  and disulfiram, can cause an unpleasant reaction when taken with alcohol. The reaction includes flushing, headache, nausea, vomiting, sweating, and increased thirst. The reaction can last from 30 minutes to several hours. What side effects may I notice from receiving this medicine? Side effects that you should report to your doctor or health care professional as soon as possible:  allergic reactions like skin rash, itching or hives, swelling of the face, lips, or tongue  breathing problems  changes in vision  fast, irregular heartbeat  high or low blood pressure  mouth sores  pain, tingling, numbness in the hands or feet  signs of decreased platelets or bleeding - bruising, pinpoint red spots on the skin, black, tarry stools, blood in the urine  signs of decreased red blood cells - unusually weak or tired, feeling faint or lightheaded, falls  signs of infection - fever or chills, cough, sore throat, pain or difficulty passing urine  signs and symptoms of liver injury like dark yellow or brown urine; general ill feeling or flu-like symptoms; light-colored stools; loss of appetite; nausea; right upper belly pain; unusually weak or tired; yellowing of the eyes or skin  swelling of the ankles, feet, hands  unusually slow heartbeat Side effects that usually do not require medical attention (report to your doctor or health care professional if they continue or are bothersome):  diarrhea  hair loss  loss of appetite  muscle or joint pain    nausea, vomiting  pain, redness, or irritation at site where injected  tiredness This list may not describe all possible side effects. Call your doctor for medical advice about side effects. You may report side effects to FDA at 1-800-FDA-1088. Where should I keep my medicine? This drug is given in a hospital or clinic and will not be stored at home. NOTE: This sheet is a summary. It may not cover all possible information. If you have  questions about this medicine, talk to your doctor, pharmacist, or health care provider.  2020 Elsevier/Gold Standard (2017-03-23 13:14:55)  

## 2020-04-30 NOTE — Progress Notes (Signed)
No blood return from port.  Cathflo given by RN.  Patient went back to lab for blood draw.

## 2020-05-03 ENCOUNTER — Encounter: Payer: Self-pay | Admitting: *Deleted

## 2020-05-03 ENCOUNTER — Telehealth: Payer: Self-pay | Admitting: Oncology

## 2020-05-03 DIAGNOSIS — U071 COVID-19: Secondary | ICD-10-CM | POA: Diagnosis not present

## 2020-05-03 NOTE — Telephone Encounter (Signed)
Scheduled appts per 9/30 los. Left voicemail with appt dates and times.

## 2020-05-06 NOTE — Progress Notes (Signed)
[place]  [phone] [phone]     ID: Cynthia Vasquez DOB: Sep 16, 1945  MR#: [id number]  HYI#:[id number]  Patient Care Team: Sueanne Margarita, DO as PCP - General (Internal Medicine) Rockwell Germany, RN as Oncology Nurse Navigator Mauro Kaufmann, RN as Oncology Nurse Navigator Acsa Estey, Virgie Dad, MD as Consulting Physician (Oncology) Alphonsa Overall, MD as Consulting Physician (General Surgery) Kyung Rudd, MD as Consulting Physician (Radiation Oncology) Wilford Corner, MD as Consulting Physician (Gastroenterology) Chauncey Cruel, MD OTHER MD:  CHIEF COMPLAINT: Estrogen receptor positive breast cancer  CURRENT TREATMENT: adjuvant chemotherapy    INTERVAL HISTORY: Cynthia Vasquez returns today for follow-up and treatment of her estrogen receptor positive breast cancer.   She began weekly paclitaxel last week on 04/30/2020.  She did well on days 1 and 2.  However she was not eating, has terrible altered taste, and just was not hungry.  She apparently was not drinking that much either.  She did continue to take her blood pressure medicine.  She has been very fatigued and notices that she gets extra fatigued when she takes a hot shower.  On day 3 she passed out while taking a shower, fell and hit her head.  She had a headache that evening but it has completely resolved.  Currently she has no headache, no visual changes, no nausea or vomiting.  She is trying to drink at least 3 bottles of water daily.  She continues to be quite fatigued.  She denies shortness of breath or chest pain.  A detailed review of systems was otherwise stable.  Her most recent echocardiogram from 02/08/2020 showed an ejection fraction of 65-70%.  Lab Results  Component Value Date   CA2729 10.4 02/13/2020    REVIEW OF SYSTEMS: Negative except as noted above  HISTORY OF CURRENT ILLNESS: From the original intake note:  Cynthia Vasquez herself palpated a left breast mass, with associated  dimpling, tenderness, and pinching pain. She underwent bilateral diagnostic mammography with tomography and left breast ultrasonography at [facility] on 11/09/2019 showing: breast density category C; 2.9 cm irregular mass in left breast at 2:30 corresponding to palpable abnormality; three enlarged left axillary lymph nodes.  Accordingly on 11/17/2019 she proceeded to biopsy of the left breast area in question. The pathology from this procedure ([id number]) showed: invasive mammary carcinoma, grade 2, e-cadherin positive. Prognostic indicators significant for: estrogen receptor, 95% positive and progesterone receptor, 95% positive, both with strong staining intensity. Proliferation marker Ki67 at 30%. HER2 negative by immunohistochemistry (1+).  The biopsied lymph node was positive for metastatic carcinoma.  The patient's subsequent history is as detailed below.   PAST MEDICAL HISTORY: Past Medical History:  Diagnosis Date  . Atrial septal defect    repaired age 51  . Cancer ([place]) 01/2020   IDC left breast  . GERD (gastroesophageal reflux disease)   . Glaucoma   . Headache    migraines until age 49- 74, have them 3-4 x year  . History of blood transfusion    with ASD surgery at age 50  . History of kidney stones    passed stones  . Hypertension   . Vitamin D deficiency     PAST SURGICAL HISTORY: Past Surgical History:  Procedure Laterality Date  . ASD REPAIR     age 84  . BREAST LUMPECTOMY WITH RADIOACTIVE SEED AND AXILLARY LYMPH NODE DISSECTION Left 01/12/2020   Procedure: LEFT BREAST LUMPECTOMY WITH RADIOACTIVE SEED AND LEFT AXILLARY LYMPH NODE DISSECTION;  Surgeon: Alphonsa Overall, MD;  Location: [place];  Service: General;  Laterality: Left;  PEC BLOCK  . COLONOSCOPY    . PORTACATH PLACEMENT Right 02/02/2020   Procedure: INSERTION PORT-A-CATH WITH ULTRASOUND GUIDANCE;  Surgeon: Alphonsa Overall, MD;  Location: [facility];  Service: General;  Laterality: Right;  .  UPPER GI ENDOSCOPY      FAMILY HISTORY: Family History  Problem Relation Age of Onset  . Dementia Mother   . Heart attack Father   . Hypertension Father   . Breast cancer Sister 33  The patient's father died at age 8 from a myocardial infarction.  The patient's mother died at 88 with Alzheimer's disease.  The patient has 2 brothers, 2 sisters.  1 sister developed breast cancer at the age of 74.  The patient's daughter has a history of thyroid cancer.  She has not been genetically tested.  There is no other history of cancer in the family to the patient's knowledge.   GYNECOLOGIC HISTORY:  No LMP recorded (lmp unknown). Patient is postmenopausal. Menarche: 74 years old Age at first live birth: 74 years old [place] P 3 LMP age 67 Contraceptive approximately 2 years, with no complications HRT no  Hysterectomy?  No BSO?  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  She is now retired.  Her husband Cynthia Vasquez (goes by [employer]") is also a retired Optometrist.  Son Cynthia Vasquez 40 lives in [place] and works for a company that processes [employer] for [employer], but he completed a masters in Chief Executive Officer and is looking for a different job; daughter Cynthia Vasquez lives in [place] and is a Oncologist; son Cynthia Vasquez lives in [place] and runs a business that puts and tennis on cell towers.  The patient has 8 grandchildren.  She attends our [employer].    ADVANCED DIRECTIVES: In the absence of any documents to the contrary the patient's husband is her healthcare power of attorney   HEALTH MAINTENANCE: Social History   Tobacco Use  . Smoking status: Never Smoker  . Smokeless tobacco: Never Used  Vaping Use  . Vaping Use: Never used  Substance Use Topics  . Alcohol use: No  . Drug use: No     Colonoscopy: "Less than 10 years ago"; [facility]  PAP: Remote  Bone density: Never   No Known Allergies  Current Outpatient  Medications  Medication Sig Dispense Refill  . acetaminophen (TYLENOL) 500 MG tablet Take 500 mg by mouth every 6 (six) hours as needed for mild pain.     Marland Kitchen acetaminophen (TYLENOL) 500 MG tablet Take 1 tablet (500 mg total) by mouth every 8 (eight) hours as needed. Take with aleve 220 md 60 tablet 0  . b complex vitamins tablet Take 1 tablet by mouth 3 (three) times a week.    . calcium gluconate 500 MG tablet Take 1 tablet (500 mg total) by mouth 3 (three) times daily.    . cholecalciferol (VITAMIN D) 1000 UNITS tablet Take 1,000 Units by mouth daily.    . ciprofloxacin (CIPRO) 500 MG tablet Take 1 tablet (500 mg total) by mouth 2 (two) times daily. Start day 8 after chemotherapy, take one tablet twice a day for five days then stop. Repeat next chemo cycle. 40 tablet 0  . dexamethasone (DECADRON) 4 MG tablet Counting chemo day as day 1, take 2 tablets by mouth twice a day on days 2 and 3  then in AM day 4, then stop 30 tablet 1  . hydrochlorothiazide (HYDRODIURIL) 25 MG tablet Take 25 mg by mouth daily.     Marland Kitchen latanoprost (XALATAN) 0.005 % ophthalmic solution Place 1 drop into both eyes at bedtime.    . lidocaine-prilocaine (EMLA) cream Apply to affected area once 30 g 3  . LORazepam (ATIVAN) 0.5 MG tablet Take 1 tablet (0.5 mg total) by mouth at bedtime as needed (Nausea or vomiting). 20 tablet 0  . losartan (COZAAR) 50 MG tablet Take 50 mg by mouth daily.     . Multiple Vitamins-Minerals (MULTIVITAMIN WITH MINERALS) tablet Take 1 tablet by mouth daily.    . naproxen sodium (ALEVE) 220 MG tablet Take 1 tablet (220 mg total) by mouth every 8 (eight) hours as needed. Take with tylenol 500 mg. 60 tablet 3  . ondansetron (ZOFRAN) 8 MG tablet Take 1 tablet (8 mg total) by mouth every 8 (eight) hours as needed for nausea or vomiting. Start on day 3 after chemo if needed 20 tablet 0  . pantoprazole (PROTONIX) 40 MG tablet Take 40 mg by mouth every other day.     . prochlorperazine (COMPAZINE) 10 MG tablet  Take 1 tablet (10 mg total) by mouth 4 (four) times daily -  before meals and at bedtime. Star the evening of chemotherapy and continue for 3 days 30 tablet 1  . promethazine (PHENERGAN) 25 MG tablet Take 1 tablet (25 mg total) by mouth every 8 (eight) hours as needed for nausea or vomiting. 30 tablet 0  . traMADol (ULTRAM) 50 MG tablet Take 1-2 tablets (50-100 mg total) by mouth every 6 (six) hours as needed. 60 tablet 0   No current facility-administered medications for this visit.    OBJECTIVE: White woman who appears stated age  57:   05/07/20 0831  BP: (!) 106/38  Pulse: 79  Resp: 18  Temp: 97.8 F (36.6 C)  SpO2: 97%   Wt Readings from Last 3 Encounters:  05/07/20 150 lb 9.6 oz (68.3 kg)  04/30/20 150 lb 3.2 oz (68.1 kg)  04/10/20 155 lb 3.2 oz (70.4 kg)   Body mass index is 30.42 kg/m.    ECOG FS:1 - Symptomatic but completely ambulatory  Sclerae unicteric, EOMs intact, pupils equal and reactive Wearing a mask No cervical or supraclavicular adenopathy Lungs no rales or rhonchi Heart regular rate and rhythm Abd soft, nontender, positive bowel sounds MSK no focal spinal tenderness, no upper extremity lymphedema Neuro: nonfocal, well oriented, appropriate affect Breasts: Deferred   LAB RESULTS:  CMP     Component Value Date/Time   NA 140 04/30/2020 1020   K 3.8 04/30/2020 1020   CL 103 04/30/2020 1020   CO2 29 04/30/2020 1020   GLUCOSE 88 04/30/2020 1020   BUN 19 04/30/2020 1020   CREATININE 1.09 (H) 04/30/2020 1020   CREATININE 1.06 (H) 11/29/2019 0845   CALCIUM 10.0 04/30/2020 1020   PROT 7.3 04/30/2020 1020   ALBUMIN 4.1 04/30/2020 1020   AST 22 04/30/2020 1020   AST 23 11/29/2019 0845   ALT 17 04/30/2020 1020   ALT 17 11/29/2019 0845   ALKPHOS 52 04/30/2020 1020   BILITOT 0.4 04/30/2020 1020   BILITOT 0.6 11/29/2019 0845   GFRNONAA 50 (L) 04/30/2020 1020   GFRNONAA 52 (L) 11/29/2019 0845   GFRAA 58 (L) 04/30/2020 1020   GFRAA >60 11/29/2019  0845    No results found for: TOTALPROTELP, ALBUMINELP, A1GS, A2GS, BETS, BETA2SER, [place], MSPIKE,  SPEI  Lab Results  Component Value Date   WBC 5.3 05/07/2020   NEUTROABS 3.2 05/07/2020   HGB 8.3 (L) 05/07/2020   HCT 24.3 (L) 05/07/2020   MCV 91.4 05/07/2020   PLT 367 05/07/2020    No results found for: LABCA2  No components found for: YNWGNF621  No results for input(s): INR in the last 168 hours.  No results found for: LABCA2  No results found for: HYQ657  No results found for: QIO962  No results found for: XBM841  Lab Results  Component Value Date   CA2729 10.4 02/13/2020    No components found for: HGQUANT  No results found for: CEA1 / No results found for: CEA1   No results found for: AFPTUMOR  No results found for: CHROMOGRNA  No results found for: KPAFRELGTCHN, LAMBDASER, KAPLAMBRATIO (kappa/lambda light chains)  No results found for: HGBA, HGBA2QUANT, HGBFQUANT, HGBSQUAN (Hemoglobinopathy evaluation)   Lab Results  Component Value Date   LDH 230 (H) 06/28/2019    No results found for: IRON, TIBC, IRONPCTSAT (Iron and TIBC)  Lab Results  Component Value Date   FERRITIN 626 (H) 07/03/2019    Urinalysis    Component Value Date/Time   COLORURINE YELLOW 06/11/2013 2007   APPEARANCEUR CLEAR 06/11/2013 2007   LABSPEC 1.017 06/11/2013 2007   PHURINE 6.5 06/11/2013 2007   GLUCOSEU NEGATIVE 06/11/2013 2007   HGBUR NEGATIVE 06/11/2013 2007   [place] NEGATIVE 06/11/2013 2007   [place] NEGATIVE 06/11/2013 2007   PROTEINUR NEGATIVE 06/11/2013 2007   UROBILINOGEN 0.2 06/11/2013 2007   NITRITE NEGATIVE 06/11/2013 2007   LEUKOCYTESUR NEGATIVE 06/11/2013 2007     STUDIES: No results found.   ELIGIBLE FOR AVAILABLE RESEARCH PROTOCOL: AET  ASSESSMENT: 74 y.o. [place] woman status post left breast upper outer quadrant biopsy 11/17/2019 for a clinical T2 N1, stage IIA invasive ductal carcinoma, grade 2, estrogen and progesterone receptor  positive, HER-2 not amplified, with an MIB-1 of 30%.  (1) status post left lumpectomy and axillary lymph node dissection 01/12/2020 for a pT2 pN2, stage IIIA invasive ductal carcinoma, grade 3, with negative margins  (a) a total of 14 axillary lymph nodes removed, 8 positive, with extracapsular extension  (2) adjuvant chemotherapy consisting of doxorubicin and cyclophosphamide in dose dense fashion x4 started 02/13/2020, completed 04/10/2020 followed by weekly paclitaxel x12 started 04/30/2020  (a) echocardiogram 02/08/2020 showed an ejection fraction in the 65/70%  (b) final 2 cycles of doxorubicin/cyclophosphamide given 21 days apart and 18% dose reduced  (3) adjuvant radiation to follow  (4) antiestrogens to start at the completion of local treatment   PLAN: Akasia tolerated her first Taxol dose without any unusual side effects and the plan is to proceed with weekly treatments as tolerated.  She is significantly anemic.  She does not tolerate this well.  I offered her a transfusion but she would prefer to wait a little and see if her red cells come up now that she is not getting the "harsh" chemotherapy.  In the meantime we are stopping her losartan and hydrochlorothiazide.  I have encouraged her to try to drink a quart daily.  Even though she hit her head the neuro exam today is entirely normal and she has no symptoms.  She is not thrombocytopenic.  I do not see an indication for head CT but if she develops any symptoms she would let us know  She will not see [place] next week but she will see me again in 2 weeks.  She knows  to call for any other issue that may develop before then  Total encounter time 30 minutes.Sarajane Jews C. Dawnette Mione, MD 05/07/2020 8:53 AM Medical Oncology and Hematology [facility] [place], [place] [address] Tel. [phone]    Fax. [phone]   I, Wilburn Mylar, am acting as scribe for Dr. Virgie Dad. Saryn Cherry.  I, Lurline Del  MD, have reviewed the above documentation for accuracy and completeness, and I agree with the above.   *Total Encounter Time as defined by the Centers for Medicare and Medicaid Services includes, in addition to the face-to-face time of a patient visit (documented in the note above) non-face-to-face time: obtaining and reviewing outside history, ordering and reviewing medications, tests or procedures, care coordination (communications with other health care professionals or caregivers) and documentation in the medical record.

## 2020-05-07 ENCOUNTER — Inpatient Hospital Stay (HOSPITAL_BASED_OUTPATIENT_CLINIC_OR_DEPARTMENT_OTHER): Payer: Medicare HMO | Admitting: Oncology

## 2020-05-07 ENCOUNTER — Inpatient Hospital Stay: Payer: Medicare HMO | Attending: Oncology

## 2020-05-07 ENCOUNTER — Encounter: Payer: Self-pay | Admitting: *Deleted

## 2020-05-07 ENCOUNTER — Other Ambulatory Visit: Payer: Self-pay | Admitting: Oncology

## 2020-05-07 ENCOUNTER — Inpatient Hospital Stay: Payer: Medicare HMO

## 2020-05-07 ENCOUNTER — Other Ambulatory Visit: Payer: Self-pay

## 2020-05-07 VITALS — BP 106/38 | HR 79 | Temp 97.8°F | Resp 18 | Ht 59.0 in | Wt 150.6 lb

## 2020-05-07 DIAGNOSIS — I10 Essential (primary) hypertension: Secondary | ICD-10-CM | POA: Diagnosis not present

## 2020-05-07 DIAGNOSIS — Z170 Estrogen receptor positive status [ER+]: Secondary | ICD-10-CM

## 2020-05-07 DIAGNOSIS — C50412 Malignant neoplasm of upper-outer quadrant of left female breast: Secondary | ICD-10-CM

## 2020-05-07 DIAGNOSIS — Z8249 Family history of ischemic heart disease and other diseases of the circulatory system: Secondary | ICD-10-CM | POA: Diagnosis not present

## 2020-05-07 DIAGNOSIS — Z803 Family history of malignant neoplasm of breast: Secondary | ICD-10-CM | POA: Insufficient documentation

## 2020-05-07 DIAGNOSIS — Z818 Family history of other mental and behavioral disorders: Secondary | ICD-10-CM | POA: Diagnosis not present

## 2020-05-07 DIAGNOSIS — Z87442 Personal history of urinary calculi: Secondary | ICD-10-CM | POA: Diagnosis not present

## 2020-05-07 DIAGNOSIS — C773 Secondary and unspecified malignant neoplasm of axilla and upper limb lymph nodes: Secondary | ICD-10-CM | POA: Insufficient documentation

## 2020-05-07 DIAGNOSIS — Z5111 Encounter for antineoplastic chemotherapy: Secondary | ICD-10-CM | POA: Diagnosis not present

## 2020-05-07 DIAGNOSIS — D6481 Anemia due to antineoplastic chemotherapy: Secondary | ICD-10-CM | POA: Diagnosis not present

## 2020-05-07 DIAGNOSIS — R0602 Shortness of breath: Secondary | ICD-10-CM | POA: Insufficient documentation

## 2020-05-07 DIAGNOSIS — Z79899 Other long term (current) drug therapy: Secondary | ICD-10-CM | POA: Diagnosis not present

## 2020-05-07 DIAGNOSIS — K219 Gastro-esophageal reflux disease without esophagitis: Secondary | ICD-10-CM | POA: Insufficient documentation

## 2020-05-07 DIAGNOSIS — Z95828 Presence of other vascular implants and grafts: Secondary | ICD-10-CM

## 2020-05-07 LAB — COMPREHENSIVE METABOLIC PANEL
ALT: 22 U/L (ref 0–44)
AST: 26 U/L (ref 15–41)
Albumin: 3.9 g/dL (ref 3.5–5.0)
Alkaline Phosphatase: 40 U/L (ref 38–126)
Anion gap: 6 (ref 5–15)
BUN: 28 mg/dL — ABNORMAL HIGH (ref 8–23)
CO2: 28 mmol/L (ref 22–32)
Calcium: 9.9 mg/dL (ref 8.9–10.3)
Chloride: 104 mmol/L (ref 98–111)
Creatinine, Ser: 1.04 mg/dL — ABNORMAL HIGH (ref 0.44–1.00)
GFR calc non Af Amer: 53 mL/min — ABNORMAL LOW (ref >60)
Glucose, Bld: 92 mg/dL (ref 70–99)
Potassium: 3.9 mmol/L (ref 3.5–5.1)
Sodium: 138 mmol/L (ref 135–145)
Total Bilirubin: 0.6 mg/dL (ref 0.3–1.2)
Total Protein: 6.7 g/dL (ref 6.5–8.1)

## 2020-05-07 LAB — CBC WITH DIFFERENTIAL/PLATELET
Abs Immature Granulocytes: 0.03 10*3/uL (ref 0.00–0.07)
Basophils Absolute: 0.1 10*3/uL (ref 0.0–0.1)
Basophils Relative: 3 %
Eosinophils Absolute: 0.8 10*3/uL — ABNORMAL HIGH (ref 0.0–0.5)
Eosinophils Relative: 14 %
HCT: 24.3 % — ABNORMAL LOW (ref 36.0–46.0)
Hemoglobin: 8.3 g/dL — ABNORMAL LOW (ref 12.0–15.0)
Immature Granulocytes: 1 %
Lymphocytes Relative: 12 %
Lymphs Abs: 0.7 10*3/uL (ref 0.7–4.0)
MCH: 31.2 pg (ref 26.0–34.0)
MCHC: 34.2 g/dL (ref 30.0–36.0)
MCV: 91.4 fL (ref 80.0–100.0)
Monocytes Absolute: 0.6 10*3/uL (ref 0.1–1.0)
Monocytes Relative: 11 %
Neutro Abs: 3.2 10*3/uL (ref 1.7–7.7)
Neutrophils Relative %: 59 %
Platelets: 367 10*3/uL (ref 150–400)
RBC: 2.66 MIL/uL — ABNORMAL LOW (ref 3.87–5.11)
RDW: 15.9 % — ABNORMAL HIGH (ref 11.5–15.5)
WBC: 5.3 10*3/uL (ref 4.0–10.5)
nRBC: 0 % (ref 0.0–0.2)

## 2020-05-07 MED ORDER — SODIUM CHLORIDE 0.9 % IV SOLN
Freq: Once | INTRAVENOUS | Status: AC
  Filled 2020-05-07: qty 250

## 2020-05-07 MED ORDER — SODIUM CHLORIDE 0.9 % IV SOLN
10.0000 mg | Freq: Once | INTRAVENOUS | Status: AC
  Administered 2020-05-07: 10 mg via INTRAVENOUS
  Filled 2020-05-07: qty 10

## 2020-05-07 MED ORDER — SODIUM CHLORIDE 0.9 % IV SOLN
80.0000 mg/m2 | Freq: Once | INTRAVENOUS | Status: AC
  Administered 2020-05-07: 138 mg via INTRAVENOUS
  Filled 2020-05-07: qty 23

## 2020-05-07 MED ORDER — HEPARIN SOD (PORK) LOCK FLUSH 100 UNIT/ML IV SOLN
500.0000 [IU] | Freq: Once | INTRAVENOUS | Status: AC | PRN
  Administered 2020-05-07: 500 [IU]
  Filled 2020-05-07: qty 5

## 2020-05-07 MED ORDER — DIPHENHYDRAMINE HCL 50 MG/ML IJ SOLN
INTRAMUSCULAR | Status: AC
  Filled 2020-05-07: qty 1

## 2020-05-07 MED ORDER — LORAZEPAM 1 MG PO TABS
0.5000 mg | ORAL_TABLET | Freq: Once | ORAL | Status: AC
  Administered 2020-05-07: 0.5 mg via ORAL

## 2020-05-07 MED ORDER — SODIUM CHLORIDE 0.9% FLUSH
10.0000 mL | INTRAVENOUS | Status: DC | PRN
  Administered 2020-05-07: 10 mL
  Filled 2020-05-07: qty 10

## 2020-05-07 MED ORDER — SODIUM CHLORIDE 0.9% FLUSH
10.0000 mL | Freq: Once | INTRAVENOUS | Status: AC
  Administered 2020-05-07: 10 mL
  Filled 2020-05-07: qty 10

## 2020-05-07 MED ORDER — DIPHENHYDRAMINE HCL 50 MG/ML IJ SOLN
25.0000 mg | Freq: Once | INTRAMUSCULAR | Status: AC
  Administered 2020-05-07: 25 mg via INTRAVENOUS

## 2020-05-07 MED ORDER — LORAZEPAM 1 MG PO TABS
ORAL_TABLET | ORAL | Status: AC
  Filled 2020-05-07: qty 1

## 2020-05-07 MED ORDER — FAMOTIDINE IN NACL 20-0.9 MG/50ML-% IV SOLN
20.0000 mg | Freq: Once | INTRAVENOUS | Status: AC
  Administered 2020-05-07: 20 mg via INTRAVENOUS

## 2020-05-07 MED ORDER — FAMOTIDINE IN NACL 20-0.9 MG/50ML-% IV SOLN
INTRAVENOUS | Status: AC
  Filled 2020-05-07: qty 50

## 2020-05-07 NOTE — Patient Instructions (Signed)
[facility] Discharge Instructions for Patients Receiving Chemotherapy  Today you received the following chemotherapy agents:  Taxol.  To help prevent nausea and vomiting after your treatment, we encourage you to take your nausea medication as directed.   If you develop nausea and vomiting that is not controlled by your nausea medication, call the clinic.   BELOW ARE SYMPTOMS THAT SHOULD BE REPORTED IMMEDIATELY:  *FEVER GREATER THAN 100.5 F  *CHILLS WITH OR WITHOUT FEVER  NAUSEA AND VOMITING THAT IS NOT CONTROLLED WITH YOUR NAUSEA MEDICATION  *UNUSUAL SHORTNESS OF BREATH  *UNUSUAL BRUISING OR BLEEDING  TENDERNESS IN MOUTH AND THROAT WITH OR WITHOUT PRESENCE OF ULCERS  *URINARY PROBLEMS  *BOWEL PROBLEMS  UNUSUAL RASH Items with * indicate a potential emergency and should be followed up as soon as possible.  Feel free to call the clinic should you have any questions or concerns. The clinic phone number is [phone].  Please show the CHEMO ALERT CARD at check-in to the Emergency Department and triage nurse.   

## 2020-05-14 ENCOUNTER — Inpatient Hospital Stay: Payer: Medicare HMO

## 2020-05-14 ENCOUNTER — Other Ambulatory Visit: Payer: Self-pay

## 2020-05-14 ENCOUNTER — Other Ambulatory Visit: Payer: Self-pay | Admitting: Oncology

## 2020-05-14 VITALS — BP 121/56 | HR 70 | Temp 99.2°F | Resp 18 | Ht 59.0 in | Wt 151.8 lb

## 2020-05-14 DIAGNOSIS — R0602 Shortness of breath: Secondary | ICD-10-CM | POA: Diagnosis not present

## 2020-05-14 DIAGNOSIS — Z5111 Encounter for antineoplastic chemotherapy: Secondary | ICD-10-CM | POA: Diagnosis not present

## 2020-05-14 DIAGNOSIS — K219 Gastro-esophageal reflux disease without esophagitis: Secondary | ICD-10-CM | POA: Diagnosis not present

## 2020-05-14 DIAGNOSIS — C50412 Malignant neoplasm of upper-outer quadrant of left female breast: Secondary | ICD-10-CM

## 2020-05-14 DIAGNOSIS — Z170 Estrogen receptor positive status [ER+]: Secondary | ICD-10-CM | POA: Diagnosis not present

## 2020-05-14 DIAGNOSIS — I10 Essential (primary) hypertension: Secondary | ICD-10-CM | POA: Diagnosis not present

## 2020-05-14 DIAGNOSIS — D6481 Anemia due to antineoplastic chemotherapy: Secondary | ICD-10-CM | POA: Diagnosis not present

## 2020-05-14 DIAGNOSIS — Z79899 Other long term (current) drug therapy: Secondary | ICD-10-CM | POA: Diagnosis not present

## 2020-05-14 DIAGNOSIS — Z95828 Presence of other vascular implants and grafts: Secondary | ICD-10-CM

## 2020-05-14 DIAGNOSIS — C773 Secondary and unspecified malignant neoplasm of axilla and upper limb lymph nodes: Secondary | ICD-10-CM | POA: Diagnosis not present

## 2020-05-14 DIAGNOSIS — G2581 Restless legs syndrome: Secondary | ICD-10-CM

## 2020-05-14 LAB — COMPREHENSIVE METABOLIC PANEL
ALT: 18 U/L (ref 0–44)
AST: 21 U/L (ref 15–41)
Albumin: 3.8 g/dL (ref 3.5–5.0)
Alkaline Phosphatase: 33 U/L — ABNORMAL LOW (ref 38–126)
Anion gap: 7 (ref 5–15)
BUN: 20 mg/dL (ref 8–23)
CO2: 25 mmol/L (ref 22–32)
Calcium: 9.6 mg/dL (ref 8.9–10.3)
Chloride: 109 mmol/L (ref 98–111)
Creatinine, Ser: 0.97 mg/dL (ref 0.44–1.00)
GFR, Estimated: 58 mL/min — ABNORMAL LOW (ref >60)
Glucose, Bld: 91 mg/dL (ref 70–99)
Potassium: 4.1 mmol/L (ref 3.5–5.1)
Sodium: 141 mmol/L (ref 135–145)
Total Bilirubin: 0.5 mg/dL (ref 0.3–1.2)
Total Protein: 6.3 g/dL — ABNORMAL LOW (ref 6.5–8.1)

## 2020-05-14 LAB — CBC WITH DIFFERENTIAL/PLATELET
Abs Immature Granulocytes: 0.04 10*3/uL (ref 0.00–0.07)
Basophils Absolute: 0.1 10*3/uL (ref 0.0–0.1)
Basophils Relative: 2 %
Eosinophils Absolute: 0.5 10*3/uL (ref 0.0–0.5)
Eosinophils Relative: 10 %
HCT: 22.6 % — ABNORMAL LOW (ref 36.0–46.0)
Hemoglobin: 7.6 g/dL — ABNORMAL LOW (ref 12.0–15.0)
Immature Granulocytes: 1 %
Lymphocytes Relative: 10 %
Lymphs Abs: 0.5 10*3/uL — ABNORMAL LOW (ref 0.7–4.0)
MCH: 31.5 pg (ref 26.0–34.0)
MCHC: 33.6 g/dL (ref 30.0–36.0)
MCV: 93.8 fL (ref 80.0–100.0)
Monocytes Absolute: 0.4 10*3/uL (ref 0.1–1.0)
Monocytes Relative: 8 %
Neutro Abs: 3.4 10*3/uL (ref 1.7–7.7)
Neutrophils Relative %: 69 %
Platelets: 266 10*3/uL (ref 150–400)
RBC: 2.41 MIL/uL — ABNORMAL LOW (ref 3.87–5.11)
RDW: 16.5 % — ABNORMAL HIGH (ref 11.5–15.5)
WBC: 4.9 10*3/uL (ref 4.0–10.5)
nRBC: 0 % (ref 0.0–0.2)

## 2020-05-14 LAB — PREPARE RBC (CROSSMATCH)

## 2020-05-14 MED ORDER — DEXAMETHASONE SODIUM PHOSPHATE 10 MG/ML IJ SOLN
INTRAMUSCULAR | Status: AC
  Filled 2020-05-14: qty 1

## 2020-05-14 MED ORDER — HEPARIN SOD (PORK) LOCK FLUSH 100 UNIT/ML IV SOLN
500.0000 [IU] | Freq: Once | INTRAVENOUS | Status: AC | PRN
  Administered 2020-05-14: 500 [IU]
  Filled 2020-05-14: qty 5

## 2020-05-14 MED ORDER — SODIUM CHLORIDE 0.9 % IV SOLN
80.0000 mg/m2 | Freq: Once | INTRAVENOUS | Status: AC
  Administered 2020-05-14: 138 mg via INTRAVENOUS
  Filled 2020-05-14: qty 23

## 2020-05-14 MED ORDER — SODIUM CHLORIDE 0.9% FLUSH
10.0000 mL | INTRAVENOUS | Status: DC | PRN
  Administered 2020-05-14: 10 mL
  Filled 2020-05-14: qty 10

## 2020-05-14 MED ORDER — LORAZEPAM 1 MG PO TABS
0.5000 mg | ORAL_TABLET | Freq: Once | ORAL | Status: AC
  Administered 2020-05-14: 0.5 mg via ORAL

## 2020-05-14 MED ORDER — DIPHENHYDRAMINE HCL 50 MG/ML IJ SOLN
INTRAMUSCULAR | Status: AC
  Filled 2020-05-14: qty 1

## 2020-05-14 MED ORDER — SODIUM CHLORIDE 0.9 % IV SOLN
Freq: Once | INTRAVENOUS | Status: AC
  Filled 2020-05-14: qty 250

## 2020-05-14 MED ORDER — DEXAMETHASONE SODIUM PHOSPHATE 10 MG/ML IJ SOLN
4.0000 mg | Freq: Once | INTRAMUSCULAR | Status: AC
  Administered 2020-05-14: 4 mg via INTRAVENOUS

## 2020-05-14 MED ORDER — SODIUM CHLORIDE 0.9% FLUSH
10.0000 mL | Freq: Once | INTRAVENOUS | Status: AC
  Administered 2020-05-14: 10 mL
  Filled 2020-05-14: qty 10

## 2020-05-14 MED ORDER — FAMOTIDINE IN NACL 20-0.9 MG/50ML-% IV SOLN
20.0000 mg | Freq: Once | INTRAVENOUS | Status: AC
  Administered 2020-05-14: 20 mg via INTRAVENOUS

## 2020-05-14 MED ORDER — SODIUM CHLORIDE 0.9 % IV SOLN: 4.0000 mg | Freq: Once | INTRAVENOUS | Status: DC

## 2020-05-14 MED ORDER — LORAZEPAM 1 MG PO TABS
ORAL_TABLET | ORAL | Status: AC
  Filled 2020-05-14: qty 1

## 2020-05-14 MED ORDER — DIPHENHYDRAMINE HCL 50 MG/ML IJ SOLN
12.5000 mg | Freq: Once | INTRAMUSCULAR | Status: AC
  Administered 2020-05-14: 12.5 mg via INTRAVENOUS

## 2020-05-14 MED ORDER — FAMOTIDINE IN NACL 20-0.9 MG/50ML-% IV SOLN
INTRAVENOUS | Status: AC
  Filled 2020-05-14: qty 50

## 2020-05-14 NOTE — Progress Notes (Signed)
Patient knows to keep blue blood bank bracelet on and return tomorrow at [facility] for her blood transfusion at 0900.

## 2020-05-14 NOTE — Progress Notes (Signed)
Pt receiving 1 unit PRBC 05/15/20 per MD. Orders placed and confirmed with blood bank

## 2020-05-14 NOTE — Progress Notes (Signed)
Per Dr. Jana Hakim: okay to treat patient with Hgb of 7.6. If patient is agreeable, she will benefit from at least a unit of PRBC in the next 2 days.  Informed patient about transfusion, she requested time to think about it.

## 2020-05-14 NOTE — Patient Instructions (Signed)
[facility] Discharge Instructions for Patients Receiving Chemotherapy  Today you received the following chemotherapy agents Paclitaxel (TAXOL).  To help prevent nausea and vomiting after your treatment, we encourage you to take your nausea medication as prescribed.  If you develop nausea and vomiting that is not controlled by your nausea medication, call the clinic.   BELOW ARE SYMPTOMS THAT SHOULD BE REPORTED IMMEDIATELY:  *FEVER GREATER THAN 100.5 F  *CHILLS WITH OR WITHOUT FEVER  NAUSEA AND VOMITING THAT IS NOT CONTROLLED WITH YOUR NAUSEA MEDICATION  *UNUSUAL SHORTNESS OF BREATH  *UNUSUAL BRUISING OR BLEEDING  TENDERNESS IN MOUTH AND THROAT WITH OR WITHOUT PRESENCE OF ULCERS  *URINARY PROBLEMS  *BOWEL PROBLEMS  UNUSUAL RASH Items with * indicate a potential emergency and should be followed up as soon as possible.  Feel free to call the clinic should you have any questions or concerns. The clinic phone number is [phone].  Please show the CHEMO ALERT CARD at check-in to the Emergency Department and triage nurse.   

## 2020-05-15 ENCOUNTER — Other Ambulatory Visit: Payer: Self-pay

## 2020-05-15 ENCOUNTER — Inpatient Hospital Stay (HOSPITAL_BASED_OUTPATIENT_CLINIC_OR_DEPARTMENT_OTHER): Payer: Medicare HMO

## 2020-05-15 VITALS — BP 142/81 | HR 85 | Temp 98.5°F | Resp 18 | Ht 59.0 in | Wt 154.2 lb

## 2020-05-15 DIAGNOSIS — Z5111 Encounter for antineoplastic chemotherapy: Secondary | ICD-10-CM | POA: Diagnosis not present

## 2020-05-15 DIAGNOSIS — Z79899 Other long term (current) drug therapy: Secondary | ICD-10-CM | POA: Diagnosis not present

## 2020-05-15 DIAGNOSIS — Z23 Encounter for immunization: Secondary | ICD-10-CM

## 2020-05-15 DIAGNOSIS — C773 Secondary and unspecified malignant neoplasm of axilla and upper limb lymph nodes: Secondary | ICD-10-CM | POA: Diagnosis not present

## 2020-05-15 DIAGNOSIS — C50412 Malignant neoplasm of upper-outer quadrant of left female breast: Secondary | ICD-10-CM

## 2020-05-15 DIAGNOSIS — R0602 Shortness of breath: Secondary | ICD-10-CM | POA: Diagnosis not present

## 2020-05-15 DIAGNOSIS — Z170 Estrogen receptor positive status [ER+]: Secondary | ICD-10-CM

## 2020-05-15 DIAGNOSIS — K219 Gastro-esophageal reflux disease without esophagitis: Secondary | ICD-10-CM | POA: Diagnosis not present

## 2020-05-15 DIAGNOSIS — D6481 Anemia due to antineoplastic chemotherapy: Secondary | ICD-10-CM | POA: Diagnosis not present

## 2020-05-15 DIAGNOSIS — I10 Essential (primary) hypertension: Secondary | ICD-10-CM | POA: Diagnosis not present

## 2020-05-15 MED ORDER — DIPHENHYDRAMINE HCL 25 MG PO CAPS
ORAL_CAPSULE | ORAL | Status: AC
  Filled 2020-05-15: qty 1

## 2020-05-15 MED ORDER — HEPARIN SOD (PORK) LOCK FLUSH 100 UNIT/ML IV SOLN
500.0000 [IU] | Freq: Every day | INTRAVENOUS | Status: AC | PRN
  Administered 2020-05-15: 500 [IU]
  Filled 2020-05-15: qty 5

## 2020-05-15 MED ORDER — ACETAMINOPHEN 325 MG PO TABS
ORAL_TABLET | ORAL | Status: AC
  Filled 2020-05-15: qty 2

## 2020-05-15 MED ORDER — INFLUENZA VAC A&B SA ADJ QUAD 0.5 ML IM PRSY: 0.5000 mL | PREFILLED_SYRINGE | Freq: Once | INTRAMUSCULAR | Status: DC

## 2020-05-15 MED ORDER — SODIUM CHLORIDE 0.9% FLUSH
10.0000 mL | INTRAVENOUS | Status: AC | PRN
  Administered 2020-05-15: 10 mL
  Filled 2020-05-15: qty 10

## 2020-05-15 MED ORDER — DIPHENHYDRAMINE HCL 25 MG PO CAPS
25.0000 mg | ORAL_CAPSULE | Freq: Once | ORAL | Status: AC
  Administered 2020-05-15: 25 mg via ORAL

## 2020-05-15 MED ORDER — LORAZEPAM 2 MG/ML IJ SOLN
INTRAMUSCULAR | Status: AC
  Filled 2020-05-15: qty 1

## 2020-05-15 MED ORDER — ACETAMINOPHEN 325 MG PO TABS
650.0000 mg | ORAL_TABLET | Freq: Once | ORAL | Status: AC
  Administered 2020-05-15: 650 mg via ORAL

## 2020-05-15 MED ORDER — LORAZEPAM 2 MG/ML IJ SOLN
0.5000 mg | Freq: Once | INTRAMUSCULAR | Status: AC
  Administered 2020-05-15: 0.5 mg via INTRAVENOUS

## 2020-05-15 MED ORDER — SODIUM CHLORIDE 0.9% IV SOLUTION
250.0000 mL | Freq: Once | INTRAVENOUS | Status: AC
  Administered 2020-05-15: 250 mL via INTRAVENOUS
  Filled 2020-05-15: qty 250

## 2020-05-15 NOTE — Patient Instructions (Signed)

## 2020-05-15 NOTE — Progress Notes (Signed)
FluAd (high dose influenza) currently oos. Patient will receive fluad @ next visit.

## 2020-05-18 LAB — TYPE AND SCREEN
ABO/RH(D): A POS
Antibody Screen: NEGATIVE
Unit division: 0
Unit division: 0

## 2020-05-18 LAB — BPAM RBC
Blood Product Expiration Date: 202111012359
Blood Product Expiration Date: 202111012359
ISSUE DATE / TIME: 202110131015
Unit Type and Rh: 6200
Unit Type and Rh: 6200

## 2020-05-20 NOTE — Progress Notes (Signed)
[facility]  [phone] [phone]     ID: Cynthia Vasquez DOB: 06/09/1946  MR#: [id number]  QPY#:[id number]  Patient Care Team: Sueanne Margarita, DO as PCP - General (Internal Medicine) Rockwell Germany, RN as Oncology Nurse Navigator Mauro Kaufmann, RN as Oncology Nurse Navigator Herbert Aguinaldo, Virgie Dad, MD as Consulting Physician (Oncology) Alphonsa Overall, MD as Consulting Physician (General Surgery) Kyung Rudd, MD as Consulting Physician (Radiation Oncology) Wilford Corner, MD as Consulting Physician (Gastroenterology) Chauncey Cruel, MD OTHER MD:  CHIEF COMPLAINT: Estrogen receptor positive breast cancer  CURRENT TREATMENT: adjuvant chemotherapy;; Retacrit   INTERVAL HISTORY: Cynthia Vasquez returns today for follow-up and treatment of her estrogen receptor positive breast cancer.   She began weekly paclitaxel on 04/30/2020.  Today is week 4.  She is tolerating this moderately well, without significant taste perversion and with no peripheral neuropathy.  Because of a very low hemoglobin and symptoms she received a unit of packed red cells on 1012 2021.  Her hemoglobin remains low.  Results for Cynthia Vasquez (MRN [id number]) as of 05/21/2020 08:38  Ref. Range 04/10/2020 11:25 04/30/2020 10:20 05/07/2020 08:20 05/14/2020 08:57 05/21/2020 08:17  Hemoglobin Latest Ref Range: 12.0 - 15.0 g/dL 9.4 (L) 9.3 (L) 8.3 (L) 7.6 (L) 8.3 (L)   Lab Results  Component Value Date   CA2729 10.4 02/13/2020    REVIEW OF SYSTEMS: Cynthia Vasquez tolerated her red cell transfusion 05/14/2020 without any unusual side effects.  2 days later she started feeling a bit better and was able to complete her tasks for instance sweeping out the porch, without having to interrupt to sit down, which she had had to do before.  She still feels tired and still feels a little bit short of breath with activity.  She does not have chest pain or pressure.  A second issue is that her urine has become  turbid and she has slight dysuria and frequency.  She has had no fever.  Detailed review of systems today is otherwise stable   HISTORY OF CURRENT ILLNESS: From the original intake note:  Cynthia Vasquez herself palpated a left breast mass, with associated dimpling, tenderness, and pinching pain. She underwent bilateral diagnostic mammography with tomography and left breast ultrasonography at [facility] on 11/09/2019 showing: breast density category C; 2.9 cm irregular mass in left breast at 2:30 corresponding to palpable abnormality; three enlarged left axillary lymph nodes.  Accordingly on 11/17/2019 she proceeded to biopsy of the left breast area in question. The pathology from this procedure ([id number]) showed: invasive mammary carcinoma, grade 2, e-cadherin positive. Prognostic indicators significant for: estrogen receptor, 95% positive and progesterone receptor, 95% positive, both with strong staining intensity. Proliferation marker Ki67 at 30%. HER2 negative by immunohistochemistry (1+).  The biopsied lymph node was positive for metastatic carcinoma.  The patient's subsequent history is as detailed below.   PAST MEDICAL HISTORY: Past Medical History:  Diagnosis Date  . Atrial septal defect    repaired age 62  . Cancer ([place]) 01/2020   IDC left breast  . GERD (gastroesophageal reflux disease)   . Glaucoma   . Headache    migraines until age 59- 64, have them 3-4 x year  . History of blood transfusion    with ASD surgery at age 7  . History of kidney stones    passed stones  . Hypertension   . Vitamin D deficiency     PAST SURGICAL HISTORY: Past Surgical History:  Procedure Laterality Date  .  ASD REPAIR     age 5  . BREAST LUMPECTOMY WITH RADIOACTIVE SEED AND AXILLARY LYMPH NODE DISSECTION Left 01/12/2020   Procedure: LEFT BREAST LUMPECTOMY WITH RADIOACTIVE SEED AND LEFT AXILLARY LYMPH NODE DISSECTION;  Surgeon: Alphonsa Overall, MD;  Location: [facility];  Service: General;   Laterality: Left;  PEC BLOCK  . COLONOSCOPY    . PORTACATH PLACEMENT Right 02/02/2020   Procedure: INSERTION PORT-A-CATH WITH ULTRASOUND GUIDANCE;  Surgeon: Alphonsa Overall, MD;  Location: [facility];  Service: General;  Laterality: Right;  . UPPER GI ENDOSCOPY      FAMILY HISTORY: Family History  Problem Relation Age of Onset  . Dementia Mother   . Heart attack Father   . Hypertension Father   . Breast cancer Sister 40  The patient's father died at age 7 from a myocardial infarction.  The patient's mother died at 25 with Alzheimer's disease.  The patient has 2 brothers, 2 sisters.  1 sister developed breast cancer at the age of 63.  The patient's daughter has a history of thyroid cancer.  She has not been genetically tested.  There is no other history of cancer in the family to the patient's knowledge.   GYNECOLOGIC HISTORY:  No LMP recorded (lmp unknown). Patient is postmenopausal. Menarche: 74 years old Age at first live birth: 74 years old [place] P 3 LMP age 56 Contraceptive approximately 2 years, with no complications HRT no  Hysterectomy?  No BSO?  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  She is now retired.  Her husband Cynthia Vasquez (goes by [employer]") is also a retired Optometrist.  Son Cynthia Vasquez 21 lives in [place] and works for a company that processes [employer] for [employer], but he completed a masters in Chief Executive Officer and is looking for a different job; daughter Cynthia Vasquez lives in [place] and is a Oncologist; son Cynthia Vasquez lives in [place] and runs a business that puts and tennis on cell towers.  The patient has 8 grandchildren.  She attends our [place] [employer].    ADVANCED DIRECTIVES: In the absence of any documents to the contrary the patient's husband is her healthcare power of attorney   HEALTH MAINTENANCE: Social History   Tobacco Use  . Smoking status: Never Smoker  . Smokeless  tobacco: Never Used  Vaping Use  . Vaping Use: Never used  Substance Use Topics  . Alcohol use: No  . Drug use: No     Colonoscopy: "Less than 10 years ago"; [facility]  PAP: Remote  Bone density: Never   No Known Allergies  Current Outpatient Medications  Medication Sig Dispense Refill  . acetaminophen (TYLENOL) 500 MG tablet Take 500 mg by mouth every 6 (six) hours as needed for mild pain.     Marland Kitchen acetaminophen (TYLENOL) 500 MG tablet Take 1 tablet (500 mg total) by mouth every 8 (eight) hours as needed. Take with aleve 220 md 60 tablet 0  . b complex vitamins tablet Take 1 tablet by mouth 3 (three) times a week.    . calcium gluconate 500 MG tablet Take 1 tablet (500 mg total) by mouth 3 (three) times daily.    . cholecalciferol (VITAMIN D) 1000 UNITS tablet Take 1,000 Units by mouth daily.    Marland Kitchen latanoprost (XALATAN) 0.005 % ophthalmic solution Place 1 drop into both eyes at bedtime.    . lidocaine-prilocaine (EMLA) cream Apply to affected area once 30 g  3  . Multiple Vitamins-Minerals (MULTIVITAMIN WITH MINERALS) tablet Take 1 tablet by mouth daily.    . naproxen sodium (ALEVE) 220 MG tablet Take 1 tablet (220 mg total) by mouth every 8 (eight) hours as needed. Take with tylenol 500 mg. 60 tablet 3  . ondansetron (ZOFRAN) 8 MG tablet Take 1 tablet (8 mg total) by mouth every 8 (eight) hours as needed for nausea or vomiting. Start on day 3 after chemo if needed 20 tablet 0  . pantoprazole (PROTONIX) 40 MG tablet Take 40 mg by mouth every other day.     . prochlorperazine (COMPAZINE) 10 MG tablet Take 1 tablet (10 mg total) by mouth 4 (four) times daily -  before meals and at bedtime. Star the evening of chemotherapy and continue for 3 days 30 tablet 1   No current facility-administered medications for this visit.    OBJECTIVE: White woman who appears stated age  36:   05/21/20 0828  BP: (!) 152/63  Pulse: 87  Resp: 16  Temp: 97.8 F (36.6 C)  SpO2: 100%   Wt Readings  from Last 3 Encounters:  05/21/20 154 lb (69.9 kg)  05/15/20 154 lb 3.2 oz (69.9 kg)  05/14/20 151 lb 12 oz (68.8 kg)   Body mass index is 31.1 kg/m.    ECOG FS:1 - Symptomatic but completely ambulatory  Sclerae unicteric, EOMs intact Wearing a mask No cervical or supraclavicular adenopathy Lungs no rales or rhonchi Heart regular rate and rhythm Abd soft, nontender, positive bowel sounds MSK no focal spinal tenderness, no upper extremity lymphedema Neuro: nonfocal, well oriented, appropriate affect Breasts: Deferred    LAB RESULTS:  CMP     Component Value Date/Time   NA 141 05/14/2020 0857   K 4.1 05/14/2020 0857   CL 109 05/14/2020 0857   CO2 25 05/14/2020 0857   GLUCOSE 91 05/14/2020 0857   BUN 20 05/14/2020 0857   CREATININE 0.97 05/14/2020 0857   CREATININE 1.06 (H) 11/29/2019 0845   CALCIUM 9.6 05/14/2020 0857   PROT 6.3 (L) 05/14/2020 0857   ALBUMIN 3.8 05/14/2020 0857   AST 21 05/14/2020 0857   AST 23 11/29/2019 0845   ALT 18 05/14/2020 0857   ALT 17 11/29/2019 0845   ALKPHOS 33 (L) 05/14/2020 0857   BILITOT 0.5 05/14/2020 0857   BILITOT 0.6 11/29/2019 0845   GFRNONAA 58 (L) 05/14/2020 0857   GFRNONAA 52 (L) 11/29/2019 0845   GFRAA 58 (L) 04/30/2020 1020   GFRAA >60 11/29/2019 0845    No results found for: TOTALPROTELP, ALBUMINELP, A1GS, A2GS, BETS, BETA2SER, GAMS, MSPIKE, SPEI  Lab Results  Component Value Date   WBC 4.0 05/21/2020   NEUTROABS 2.5 05/21/2020   HGB 8.3 (L) 05/21/2020   HCT 25.3 (L) 05/21/2020   MCV 95.8 05/21/2020   PLT 256 05/21/2020    No results found for: LABCA2  No components found for: PTWSFK812  No results for input(s): INR in the last 168 hours.  No results found for: LABCA2  No results found for: XNT700  No results found for: FVC944  No results found for: HQP591  Lab Results  Component Value Date   CA2729 10.4 02/13/2020    No components found for: HGQUANT  No results found for: CEA1 / No results  found for: CEA1   No results found for: AFPTUMOR  No results found for: CHROMOGRNA  No results found for: KPAFRELGTCHN, LAMBDASER, KAPLAMBRATIO (kappa/lambda light chains)  No results found for: HGBA, HGBA2QUANT, HGBFQUANT,  HGBSQUAN (Hemoglobinopathy evaluation)   Lab Results  Component Value Date   LDH 230 (H) 06/28/2019    No results found for: IRON, TIBC, IRONPCTSAT (Iron and TIBC)  Lab Results  Component Value Date   FERRITIN 626 (H) 07/03/2019    Urinalysis    Component Value Date/Time   COLORURINE YELLOW 06/11/2013 2007   APPEARANCEUR CLEAR 06/11/2013 2007   LABSPEC 1.017 06/11/2013 2007   PHURINE 6.5 06/11/2013 2007   GLUCOSEU NEGATIVE 06/11/2013 2007   HGBUR NEGATIVE 06/11/2013 2007   [place] NEGATIVE 06/11/2013 2007   [place] NEGATIVE 06/11/2013 2007   PROTEINUR NEGATIVE 06/11/2013 2007   UROBILINOGEN 0.2 06/11/2013 2007   NITRITE NEGATIVE 06/11/2013 2007   LEUKOCYTESUR NEGATIVE 06/11/2013 2007    STUDIES: No results found.   ELIGIBLE FOR AVAILABLE RESEARCH PROTOCOL: AET  ASSESSMENT: 74 y.o. [place] woman status post left breast upper outer quadrant biopsy 11/17/2019 for a clinical T2 N1, stage IIA invasive ductal carcinoma, grade 2, estrogen and progesterone receptor positive, HER-2 not amplified, with an MIB-1 of 30%.  (1) status post left lumpectomy and axillary lymph node dissection 01/12/2020 for a pT2 pN2, stage IIIA invasive ductal carcinoma, grade 3, with negative margins  (a) a total of 14 axillary lymph nodes removed, 8 positive, with extracapsular extension  (2) adjuvant chemotherapy consisting of doxorubicin and cyclophosphamide in dose dense fashion x4 started 02/13/2020, completed 04/10/2020 followed by weekly paclitaxel x12 started 04/30/2020  (a) echocardiogram 02/08/2020 showed an ejection fraction in the 65/70%  (b) final 2 cycles of doxorubicin/cyclophosphamide given 21 days apart and 18% dose reduced  (3) adjuvant  radiation to follow  (4) antiestrogens to start at the completion of local treatment   PLAN: Mandisa will proceed to the fourth of 12 planned paclitaxel weekly treatments today.  She was again alerted to the possibility of neuropathy and she will let us know if that develops  Her hemoglobin improved back to her very low baseline of 8.3 after transfusion.  I think she is going to need a little bit of EPO support if organ to be able to get her through the paclitaxel on time.  I wrote for Retacrit today.  Hopefully we can get it started.  She does understand the possible toxicities side effects and complications of this agent  I think she has had a urinary tract infection.  She has some Cipro already on hand.  She will start that today and take it for 3 days.  Otherwise I will see her again in 1 week.  She knows to call for any other problems that may develop before then  Total encounter time 30 minutes.Sarajane Jews C. Marilouise Densmore, MD 05/21/2020 8:36 AM Medical Oncology and Hematology [facility] [place], [place] [address] Tel. [phone]    Fax. [phone]   I, Wilburn Mylar, am acting as scribe for Dr. Virgie Dad. Susy Placzek.  I, Lurline Del MD, have reviewed the above documentation for accuracy and completeness, and I agree with the above.   *Total Encounter Time as defined by the Centers for Medicare and Medicaid Services includes, in addition to the face-to-face time of a patient visit (documented in the note above) non-face-to-face time: obtaining and reviewing outside history, ordering and reviewing medications, tests or procedures, care coordination (communications with other health care professionals or caregivers) and documentation in the medical record.

## 2020-05-21 ENCOUNTER — Inpatient Hospital Stay: Payer: Medicare HMO

## 2020-05-21 ENCOUNTER — Other Ambulatory Visit: Payer: Self-pay

## 2020-05-21 ENCOUNTER — Inpatient Hospital Stay (HOSPITAL_BASED_OUTPATIENT_CLINIC_OR_DEPARTMENT_OTHER): Payer: Medicare HMO | Admitting: Oncology

## 2020-05-21 VITALS — BP 152/63 | HR 87 | Temp 97.8°F | Resp 16 | Ht 59.0 in | Wt 154.0 lb

## 2020-05-21 DIAGNOSIS — C773 Secondary and unspecified malignant neoplasm of axilla and upper limb lymph nodes: Secondary | ICD-10-CM | POA: Diagnosis not present

## 2020-05-21 DIAGNOSIS — Z170 Estrogen receptor positive status [ER+]: Secondary | ICD-10-CM

## 2020-05-21 DIAGNOSIS — C50412 Malignant neoplasm of upper-outer quadrant of left female breast: Secondary | ICD-10-CM

## 2020-05-21 DIAGNOSIS — K219 Gastro-esophageal reflux disease without esophagitis: Secondary | ICD-10-CM | POA: Diagnosis not present

## 2020-05-21 DIAGNOSIS — Z95828 Presence of other vascular implants and grafts: Secondary | ICD-10-CM

## 2020-05-21 DIAGNOSIS — D6481 Anemia due to antineoplastic chemotherapy: Secondary | ICD-10-CM | POA: Diagnosis not present

## 2020-05-21 DIAGNOSIS — Z5111 Encounter for antineoplastic chemotherapy: Secondary | ICD-10-CM | POA: Diagnosis not present

## 2020-05-21 DIAGNOSIS — Z79899 Other long term (current) drug therapy: Secondary | ICD-10-CM | POA: Diagnosis not present

## 2020-05-21 DIAGNOSIS — R0602 Shortness of breath: Secondary | ICD-10-CM | POA: Diagnosis not present

## 2020-05-21 DIAGNOSIS — I10 Essential (primary) hypertension: Secondary | ICD-10-CM | POA: Diagnosis not present

## 2020-05-21 LAB — CBC WITH DIFFERENTIAL/PLATELET
Abs Immature Granulocytes: 0.01 10*3/uL (ref 0.00–0.07)
Basophils Absolute: 0.1 10*3/uL (ref 0.0–0.1)
Basophils Relative: 2 %
Eosinophils Absolute: 0.4 10*3/uL (ref 0.0–0.5)
Eosinophils Relative: 10 %
HCT: 25.3 % — ABNORMAL LOW (ref 36.0–46.0)
Hemoglobin: 8.3 g/dL — ABNORMAL LOW (ref 12.0–15.0)
Immature Granulocytes: 0 %
Lymphocytes Relative: 14 %
Lymphs Abs: 0.6 10*3/uL — ABNORMAL LOW (ref 0.7–4.0)
MCH: 31.4 pg (ref 26.0–34.0)
MCHC: 32.8 g/dL (ref 30.0–36.0)
MCV: 95.8 fL (ref 80.0–100.0)
Monocytes Absolute: 0.5 10*3/uL (ref 0.1–1.0)
Monocytes Relative: 12 %
Neutro Abs: 2.5 10*3/uL (ref 1.7–7.7)
Neutrophils Relative %: 62 %
Platelets: 256 10*3/uL (ref 150–400)
RBC: 2.64 MIL/uL — ABNORMAL LOW (ref 3.87–5.11)
RDW: 16.1 % — ABNORMAL HIGH (ref 11.5–15.5)
WBC: 4 10*3/uL (ref 4.0–10.5)
nRBC: 0 % (ref 0.0–0.2)

## 2020-05-21 LAB — COMPREHENSIVE METABOLIC PANEL
ALT: 16 U/L (ref 0–44)
AST: 21 U/L (ref 15–41)
Albumin: 3.8 g/dL (ref 3.5–5.0)
Alkaline Phosphatase: 35 U/L — ABNORMAL LOW (ref 38–126)
Anion gap: 5 (ref 5–15)
BUN: 17 mg/dL (ref 8–23)
CO2: 27 mmol/L (ref 22–32)
Calcium: 9.7 mg/dL (ref 8.9–10.3)
Chloride: 109 mmol/L (ref 98–111)
Creatinine, Ser: 0.84 mg/dL (ref 0.44–1.00)
GFR, Estimated: >60 mL/min (ref >60)
Glucose, Bld: 99 mg/dL (ref 70–99)
Potassium: 3.7 mmol/L (ref 3.5–5.1)
Sodium: 141 mmol/L (ref 135–145)
Total Bilirubin: 1.1 mg/dL (ref 0.3–1.2)
Total Protein: 6.5 g/dL (ref 6.5–8.1)

## 2020-05-21 MED ORDER — SODIUM CHLORIDE 0.9 % IV SOLN
80.0000 mg/m2 | Freq: Once | INTRAVENOUS | Status: AC
  Administered 2020-05-21: 138 mg via INTRAVENOUS
  Filled 2020-05-21: qty 23

## 2020-05-21 MED ORDER — SODIUM CHLORIDE 0.9% FLUSH
10.0000 mL | Freq: Once | INTRAVENOUS | Status: AC
  Administered 2020-05-21: 10 mL
  Filled 2020-05-21: qty 10

## 2020-05-21 MED ORDER — DIPHENHYDRAMINE HCL 50 MG/ML IJ SOLN
INTRAMUSCULAR | Status: AC
  Filled 2020-05-21: qty 1

## 2020-05-21 MED ORDER — LORAZEPAM 1 MG PO TABS
0.5000 mg | ORAL_TABLET | Freq: Once | ORAL | Status: AC
  Administered 2020-05-21: 0.5 mg via ORAL

## 2020-05-21 MED ORDER — DIPHENHYDRAMINE HCL 50 MG/ML IJ SOLN
12.5000 mg | Freq: Once | INTRAMUSCULAR | Status: AC
  Administered 2020-05-21: 12.5 mg via INTRAVENOUS

## 2020-05-21 MED ORDER — LORAZEPAM 1 MG PO TABS
ORAL_TABLET | ORAL | Status: AC
  Filled 2020-05-21: qty 1

## 2020-05-21 MED ORDER — FAMOTIDINE IN NACL 20-0.9 MG/50ML-% IV SOLN
INTRAVENOUS | Status: AC
  Filled 2020-05-21: qty 50

## 2020-05-21 MED ORDER — HEPARIN SOD (PORK) LOCK FLUSH 100 UNIT/ML IV SOLN
500.0000 [IU] | Freq: Once | INTRAVENOUS | Status: AC | PRN
  Administered 2020-05-21: 500 [IU]
  Filled 2020-05-21: qty 5

## 2020-05-21 MED ORDER — DEXAMETHASONE SODIUM PHOSPHATE 10 MG/ML IJ SOLN
INTRAMUSCULAR | Status: AC
  Filled 2020-05-21: qty 1

## 2020-05-21 MED ORDER — EPOETIN ALFA-EPBX 10000 UNIT/ML IJ SOLN
INTRAMUSCULAR | Status: AC
  Filled 2020-05-21: qty 1

## 2020-05-21 MED ORDER — EPOETIN ALFA-EPBX 10000 UNIT/ML IJ SOLN
10000.0000 [IU] | Freq: Once | INTRAMUSCULAR | Status: AC
  Administered 2020-05-21: 10000 [IU] via SUBCUTANEOUS

## 2020-05-21 MED ORDER — SODIUM CHLORIDE 0.9% FLUSH
10.0000 mL | INTRAVENOUS | Status: DC | PRN
  Administered 2020-05-21: 10 mL
  Filled 2020-05-21: qty 10

## 2020-05-21 MED ORDER — FAMOTIDINE IN NACL 20-0.9 MG/50ML-% IV SOLN
20.0000 mg | Freq: Once | INTRAVENOUS | Status: AC
  Administered 2020-05-21: 20 mg via INTRAVENOUS

## 2020-05-21 MED ORDER — DEXAMETHASONE SODIUM PHOSPHATE 10 MG/ML IJ SOLN
4.0000 mg | Freq: Once | INTRAMUSCULAR | Status: AC
  Administered 2020-05-21: 4 mg via INTRAVENOUS

## 2020-05-21 MED ORDER — SODIUM CHLORIDE 0.9 % IV SOLN
Freq: Once | INTRAVENOUS | Status: AC
  Filled 2020-05-21: qty 250

## 2020-05-21 NOTE — Patient Instructions (Addendum)
[facility] Discharge Instructions for Patients Receiving Chemotherapy  Today you received the following chemotherapy agents: paclitaxel.   To help prevent nausea and vomiting after your treatment, we encourage you to take your nausea medication as directed.   If you develop nausea and vomiting that is not controlled by your nausea medication, call the clinic.   BELOW ARE SYMPTOMS THAT SHOULD BE REPORTED IMMEDIATELY:  *FEVER GREATER THAN 100.5 F  *CHILLS WITH OR WITHOUT FEVER  NAUSEA AND VOMITING THAT IS NOT CONTROLLED WITH YOUR NAUSEA MEDICATION  *UNUSUAL SHORTNESS OF BREATH  *UNUSUAL BRUISING OR BLEEDING  TENDERNESS IN MOUTH AND THROAT WITH OR WITHOUT PRESENCE OF ULCERS  *URINARY PROBLEMS  *BOWEL PROBLEMS  UNUSUAL RASH Items with * indicate a potential emergency and should be followed up as soon as possible.  Feel free to call the clinic should you have any questions or concerns. The clinic phone number is [phone].  Please show the [facility] at check-in to the Emergency Department and triage nurse.  Epoetin Alfa injection What is this medicine? EPOETIN ALFA (e POE e tin AL fa) helps your body make more red blood cells. This medicine is used to treat anemia caused by chronic kidney disease, cancer chemotherapy, or HIV-therapy. It may also be used before surgery if you have anemia. This medicine may be used for other purposes; ask your health care provider or pharmacist if you have questions. COMMON BRAND NAME(S): Epogen, Procrit, Retacrit What should I tell my health care provider before I take this medicine? They need to know if you have any of these conditions:  cancer  heart disease  high blood pressure  history of blood clots  history of stroke  low levels of folate, iron, or vitamin B12 in the blood  seizures  an unusual or allergic reaction to erythropoietin, albumin, benzyl alcohol, hamster proteins, other medicines, foods,  dyes, or preservatives  pregnant or trying to get pregnant  breast-feeding How should I use this medicine? This medicine is for injection into a vein or under the skin. It is usually given by a health care professional in a hospital or clinic setting. If you get this medicine at home, you will be taught how to prepare and give this medicine. Use exactly as directed. Take your medicine at regular intervals. Do not take your medicine more often than directed. It is important that you put your used needles and syringes in a special sharps container. Do not put them in a trash can. If you do not have a sharps container, call your pharmacist or healthcare provider to get one. A special MedGuide will be given to you by the pharmacist with each prescription and refill. Be sure to read this information carefully each time. Talk to your pediatrician regarding the use of this medicine in children. While this drug may be prescribed for selected conditions, precautions do apply. Overdosage: If you think you have taken too much of this medicine contact a poison control center or emergency room at once. NOTE: This medicine is only for you. Do not share this medicine with others. What if I miss a dose? If you miss a dose, take it as soon as you can. If it is almost time for your next dose, take only that dose. Do not take double or extra doses. What may interact with this medicine? Interactions have not been studied. This list may not describe all possible interactions. Give your health care provider a list of all  the medicines, herbs, non-prescription drugs, or dietary supplements you use. Also tell them if you smoke, drink alcohol, or use illegal drugs. Some items may interact with your medicine. What should I watch for while using this medicine? Your condition will be monitored carefully while you are receiving this medicine. You may need blood work done while you are taking this medicine. This medicine may  cause a decrease in vitamin B6. You should make sure that you get enough vitamin B6 while you are taking this medicine. Discuss the foods you eat and the vitamins you take with your health care professional. What side effects may I notice from receiving this medicine? Side effects that you should report to your doctor or health care professional as soon as possible:  allergic reactions like skin rash, itching or hives, swelling of the face, lips, or tongue  seizures  signs and symptoms of a blood clot such as breathing problems; changes in vision; chest pain; severe, sudden headache; pain, swelling, warmth in the leg; trouble speaking; sudden numbness or weakness of the face, arm or leg  signs and symptoms of a stroke like changes in vision; confusion; trouble speaking or understanding; severe headaches; sudden numbness or weakness of the face, arm or leg; trouble walking; dizziness; loss of balance or coordination Side effects that usually do not require medical attention (report to your doctor or health care professional if they continue or are bothersome):  chills  cough  dizziness  fever  headaches  joint pain  muscle cramps  muscle pain  nausea, vomiting  pain, redness, or irritation at site where injected This list may not describe all possible side effects. Call your doctor for medical advice about side effects. You may report side effects to FDA at 1-800-FDA-1088. Where should I keep my medicine? Keep out of the reach of children. Store in a refrigerator between 2 and 8 degrees C (36 and 46 degrees F). Do not freeze or shake. Throw away any unused portion if using a single-dose vial. Multi-dose vials can be kept in the refrigerator for up to 21 days after the initial dose. Throw away unused medicine. NOTE: This sheet is a summary. It may not cover all possible information. If you have questions about this medicine, talk to your doctor, pharmacist, or health care  provider.  2020 Elsevier/Gold Standard (2017-02-26 08:35:19)

## 2020-05-21 NOTE — Addendum Note (Signed)
Addended by: Chauncey Cruel on: 05/21/2020 09:04 AM   Modules accepted: Orders

## 2020-05-21 NOTE — Progress Notes (Signed)
Patient is deferring her flu shot until after chemotherapy.  Hardie Pulley, PharmD, BCPS, BCOP

## 2020-05-27 NOTE — Progress Notes (Signed)
Cynthia Vasquez  [phone] [phone]     ID: Cynthia Vasquez DOB: 25-Dec-1945  MR#: [id number]  KMQ#:[id number]  Patient Care Team: Cynthia Margarita, DO as PCP - General (Internal Medicine) Cynthia Germany, RN as Oncology Nurse Navigator Cynthia Kaufmann, RN as Oncology Nurse Navigator Cynthia Vasquez, Cynthia Dad, MD as Consulting Physician (Oncology) Cynthia Overall, MD as Consulting Physician (General Surgery) Cynthia Rudd, MD as Consulting Physician (Radiation Oncology) Cynthia Corner, MD as Consulting Physician (Gastroenterology) Cynthia Vasquez OTHER MD:  CHIEF COMPLAINT: Estrogen receptor positive breast cancer  CURRENT TREATMENT: adjuvant chemotherapy   INTERVAL HISTORY: Cynthia Vasquez returns today for follow-up and treatment of her estrogen receptor positive breast cancer.   She began weekly paclitaxel on 04/30/2020.  Today is week 5.  She is tolerating this moderately well, without significant taste perversion and with no peripheral neuropathy.  Because of a very low hemoglobin and symptoms she received a unit of packed red cells on 05/14/2020.  Her hemoglobin remains low. Lab Results  Component Value Date   HGB 8.3 (L) 05/21/2020   HGB 7.6 (L) 05/14/2020   HGB 8.3 (L) 05/07/2020   HGB 9.3 (L) 04/30/2020   HGB 9.4 (L) 04/10/2020   Lab Results  Component Value Date   CA2729 10.4 02/13/2020    REVIEW OF SYSTEMS: Cynthia Vasquez tells me she is feeling better.  She has more appetite.  Her sense of taste is also slightly improved.  She now can rake the leaves in her yard without having to stop.  She has developed some discomfort below the knees bilaterally.  There is minimal swelling.  There is no erythema.  This is not neuropathy: It does not affect the toes and she certainly has nothing on the fingers.  A detailed review of systems today was otherwise stable   HISTORY OF CURRENT ILLNESS: From the original intake note:  Cynthia Vasquez herself palpated a left  breast mass, with associated dimpling, tenderness, and pinching pain. She underwent bilateral diagnostic mammography with tomography and left breast ultrasonography at [facility] on 11/09/2019 showing: breast density category C; 2.9 cm irregular mass in left breast at 2:30 corresponding to palpable abnormality; three enlarged left axillary lymph nodes.  Accordingly on 11/17/2019 she proceeded to biopsy of the left breast area in question. The pathology from this procedure ([id number]) showed: invasive mammary carcinoma, grade 2, e-cadherin positive. Prognostic indicators significant for: estrogen receptor, 95% positive and progesterone receptor, 95% positive, both with strong staining intensity. Proliferation marker Ki67 at 30%. HER2 negative by immunohistochemistry (1+).  The biopsied lymph node was positive for metastatic carcinoma.  The patient's subsequent history is as detailed below.   PAST MEDICAL HISTORY: Past Medical History:  Diagnosis Date  . Atrial septal defect    repaired age 77  . Cancer ([place]) 01/2020   IDC left breast  . GERD (gastroesophageal reflux disease)   . Glaucoma   . Headache    migraines until age 77- 89, have them 3-4 x year  . History of blood transfusion    with ASD surgery at age 25  . History of kidney stones    passed stones  . Hypertension   . Vitamin D deficiency     PAST SURGICAL HISTORY: Past Surgical History:  Procedure Laterality Date  . ASD REPAIR     age 76  . BREAST LUMPECTOMY WITH RADIOACTIVE SEED AND AXILLARY LYMPH NODE DISSECTION Left 01/12/2020   Procedure: LEFT BREAST LUMPECTOMY WITH RADIOACTIVE SEED AND LEFT AXILLARY  LYMPH NODE DISSECTION;  Surgeon: Cynthia Overall, MD;  Location: [facility];  Service: General;  Laterality: Left;  PEC BLOCK  . COLONOSCOPY    . PORTACATH PLACEMENT Right 02/02/2020   Procedure: INSERTION PORT-A-CATH WITH ULTRASOUND GUIDANCE;  Surgeon: Cynthia Overall, MD;  Location: [facility];  Service:  General;  Laterality: Right;  . UPPER GI ENDOSCOPY      FAMILY HISTORY: Family History  Problem Relation Age of Onset  . Dementia Mother   . Heart attack Father   . Hypertension Father   . Breast cancer Sister 7  The patient's father died at age 74 from a myocardial infarction.  The patient's mother died at 38 with Alzheimer's disease.  The patient has 2 brothers, 2 sisters.  1 sister developed breast cancer at the age of 32.  The patient's daughter has a history of thyroid cancer.  She has not been genetically tested.  There is no other history of cancer in the family to the patient's knowledge.   GYNECOLOGIC HISTORY:  No LMP recorded (lmp unknown). Patient is postmenopausal. Menarche: 74 years old Age at first live birth: 74 years old [place] P 3 LMP age 49 Contraceptive approximately 2 years, with no complications HRT no  Hysterectomy?  No BSO?  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  She is now retired.  Her husband Cynthia Vasquez (goes by Cynthia Vasquez") is also a retired Optometrist.  Son Cynthia Vasquez 44 lives in [place] and works for a Vasquez that processes Cynthia Vasquez for [employer], but he completed a masters in Chief Executive Officer and is looking for a different job; daughter Cynthia Vasquez lives in [place] and is a Oncologist; son Cynthia Vasquez lives in [place] and runs a business that puts and tennis on cell towers.  The patient has 8 grandchildren.  She attends our [employer].    ADVANCED DIRECTIVES: In the absence of any documents to the contrary the patient's husband is her healthcare power of attorney   HEALTH MAINTENANCE: Social History   Tobacco Use  . Smoking status: Never Smoker  . Smokeless tobacco: Never Used  Vaping Use  . Vaping Use: Never used  Substance Use Topics  . Alcohol use: No  . Drug use: No     Colonoscopy: "Less than 10 years ago"; [facility]  PAP: Remote  Bone density: Never   No Known  Allergies  Current Outpatient Medications  Medication Sig Dispense Refill  . acetaminophen (TYLENOL) 500 MG tablet Take 500 mg by mouth every 6 (six) hours as needed for mild pain.     Marland Kitchen acetaminophen (TYLENOL) 500 MG tablet Take 1 tablet (500 mg total) by mouth every 8 (eight) hours as needed. Take with aleve 220 md 60 tablet 0  . b complex vitamins tablet Take 1 tablet by mouth 3 (three) times a week.    . calcium gluconate 500 MG tablet Take 1 tablet (500 mg total) by mouth 3 (three) times daily.    . cholecalciferol (VITAMIN D) 1000 UNITS tablet Take 1,000 Units by mouth daily.    Marland Kitchen latanoprost (XALATAN) 0.005 % ophthalmic solution Place 1 drop into both eyes at bedtime.    . lidocaine-prilocaine (EMLA) cream Apply to affected area once 30 g 3  . Multiple Vitamins-Minerals (MULTIVITAMIN WITH MINERALS) tablet Take 1 tablet by mouth daily.    . naproxen sodium (ALEVE) 220 MG tablet Take 1 tablet (220 mg total) by mouth every  8 (eight) hours as needed. Take with tylenol 500 mg. 60 tablet 3  . ondansetron (ZOFRAN) 8 MG tablet Take 1 tablet (8 mg total) by mouth every 8 (eight) hours as needed for nausea or vomiting. Start on day 3 after chemo if needed 20 tablet 0  . pantoprazole (PROTONIX) 40 MG tablet Take 40 mg by mouth every other day.     . prochlorperazine (COMPAZINE) 10 MG tablet Take 1 tablet (10 mg total) by mouth 4 (four) times daily -  before meals and at bedtime. Star the evening of chemotherapy and continue for 3 days 30 tablet 1   No current facility-administered medications for this visit.    OBJECTIVE: White woman who appears stated age  There were no vitals filed for this visit. Wt Readings from Last 3 Encounters:  05/21/20 154 lb (69.9 kg)  05/15/20 154 lb 3.2 oz (69.9 kg)  05/14/20 151 lb 12 oz (68.8 kg)   There is no height or weight on file to calculate BMI.    ECOG FS:1 - Symptomatic but completely ambulatory  Sclerae unicteric, EOMs intact Wearing a mask No  cervical or supraclavicular adenopathy Lungs no rales or rhonchi Heart regular rate and rhythm Abd soft, nontender, positive bowel sounds MSK no focal spinal tenderness, no upper extremity lymphedema Neuro: nonfocal, well oriented, appropriate affect Breasts: Deferred   LAB RESULTS:  CMP     Component Value Date/Time   NA 141 05/21/2020 0817   K 3.7 05/21/2020 0817   CL 109 05/21/2020 0817   CO2 27 05/21/2020 0817   GLUCOSE 99 05/21/2020 0817   BUN 17 05/21/2020 0817   CREATININE 0.84 05/21/2020 0817   CREATININE 1.06 (H) 11/29/2019 0845   CALCIUM 9.7 05/21/2020 0817   PROT 6.5 05/21/2020 0817   ALBUMIN 3.8 05/21/2020 0817   AST 21 05/21/2020 0817   AST 23 11/29/2019 0845   ALT 16 05/21/2020 0817   ALT 17 11/29/2019 0845   ALKPHOS 35 (L) 05/21/2020 0817   BILITOT 1.1 05/21/2020 0817   BILITOT 0.6 11/29/2019 0845   GFRNONAA >60 05/21/2020 0817   GFRNONAA 52 (L) 11/29/2019 0845   GFRAA 58 (L) 04/30/2020 1020   GFRAA >60 11/29/2019 0845    No results found for: TOTALPROTELP, ALBUMINELP, A1GS, A2GS, BETS, BETA2SER, GAMS, MSPIKE, SPEI  Lab Results  Component Value Date   WBC 4.0 05/21/2020   NEUTROABS 2.5 05/21/2020   HGB 8.3 (L) 05/21/2020   HCT 25.3 (L) 05/21/2020   MCV 95.8 05/21/2020   PLT 256 05/21/2020    No results found for: LABCA2  No components found for: VGKKDP947  No results for input(s): INR in the last 168 hours.  No results found for: LABCA2  No results found for: MRA151  No results found for: IDU373  No results found for: HDI978  Lab Results  Component Value Date   CA2729 10.4 02/13/2020    No components found for: HGQUANT  No results found for: CEA1 / No results found for: CEA1   No results found for: AFPTUMOR  No results found for: CHROMOGRNA  No results found for: KPAFRELGTCHN, LAMBDASER, KAPLAMBRATIO (kappa/lambda light chains)  No results found for: HGBA, HGBA2QUANT, HGBFQUANT, HGBSQUAN (Hemoglobinopathy evaluation)    Lab Results  Component Value Date   LDH 230 (H) 06/28/2019    No results found for: IRON, TIBC, IRONPCTSAT (Iron and TIBC)  Lab Results  Component Value Date   FERRITIN 626 (H) 07/03/2019    Urinalysis  Component Value Date/Time   COLORURINE YELLOW 06/11/2013 2007   APPEARANCEUR CLEAR 06/11/2013 2007   LABSPEC 1.017 06/11/2013 2007   PHURINE 6.5 06/11/2013 2007   GLUCOSEU NEGATIVE 06/11/2013 2007   HGBUR NEGATIVE 06/11/2013 2007   BILIRUBINUR NEGATIVE 06/11/2013 2007   [place] NEGATIVE 06/11/2013 2007   PROTEINUR NEGATIVE 06/11/2013 2007   UROBILINOGEN 0.2 06/11/2013 2007   NITRITE NEGATIVE 06/11/2013 2007   LEUKOCYTESUR NEGATIVE 06/11/2013 2007    STUDIES: No results found.   ELIGIBLE FOR AVAILABLE RESEARCH PROTOCOL: AET  ASSESSMENT: 74 y.o. [place] woman status post left breast upper outer quadrant biopsy 11/17/2019 for a clinical T2 N1, stage IIA invasive ductal carcinoma, grade 2, estrogen and progesterone receptor positive, HER-2 not amplified, with an MIB-1 of 30%.  (1) status post left lumpectomy and axillary lymph node dissection 01/12/2020 for a pT2 pN2, stage IIIA invasive ductal carcinoma, grade 3, with negative margins  (a) a total of 14 axillary lymph nodes removed, 8 positive, with extracapsular extension  (2) adjuvant chemotherapy consisting of doxorubicin and cyclophosphamide in dose dense fashion x4 started 02/13/2020, completed 04/10/2020, followed by weekly paclitaxel x12 started 04/30/2020  (a) echocardiogram 02/08/2020 showed an ejection fraction in the 65/70%  (b) final 2 cycles of doxorubicin/cyclophosphamide given 21 days apart and 18% dose reduced  (3) adjuvant radiation to follow  (4) antiestrogens to start at the completion of local treatment   PLAN: Yuko is tolerating Taxol generally well and certainly there is no neuropathy to date.  We are proceeding with chemo today.  We again discussed neuropathy issues and if she  develops any numbness or tingling in the pads of her fingers or toes she will alert [place].  I do not have a simple explanation for the discomfort she is experiencing in her lower legs bilaterally.  Because it is bilateral it is unlikely to be DVT.  Because it does not affect the toes it is not neuropathy.  Possibly it is related to her earlier chemotherapy.  I suggested she try compression stockings but I do not have a really good solution for this.  We will simply keep an eye on it.  She will continue weekly chemo and I will see her again on November 16.   Total encounter time 25 minutes.Sarajane Jews C. Yaron Grasse, MD 05/27/2020 10:04 PM Medical Oncology and Hematology [facility] [place], [place] [address] Tel. [phone]    Fax. [phone]   I, Wilburn Mylar, am acting as scribe for Dr. Virgie Vasquez. Sirenia Whitis.  I, Lurline Del MD, have reviewed the above documentation for accuracy and completeness, and I agree with the above.   *Total Encounter Time as defined by the Centers for Medicare and Medicaid Services includes, in addition to the face-to-face time of a patient visit (documented in the note above) non-face-to-face time: obtaining and reviewing outside history, ordering and reviewing medications, tests or procedures, care coordination (communications with other health care professionals or caregivers) and documentation in the medical record.

## 2020-05-28 ENCOUNTER — Telehealth: Payer: Self-pay

## 2020-05-28 ENCOUNTER — Inpatient Hospital Stay: Payer: Medicare HMO | Admitting: Oncology

## 2020-05-28 ENCOUNTER — Inpatient Hospital Stay: Payer: Medicare HMO

## 2020-05-28 ENCOUNTER — Other Ambulatory Visit: Payer: Self-pay

## 2020-05-28 VITALS — BP 134/47 | HR 82 | Temp 97.0°F | Resp 18 | Ht 59.0 in | Wt 150.7 lb

## 2020-05-28 DIAGNOSIS — R0602 Shortness of breath: Secondary | ICD-10-CM | POA: Diagnosis not present

## 2020-05-28 DIAGNOSIS — Z5111 Encounter for antineoplastic chemotherapy: Secondary | ICD-10-CM | POA: Diagnosis not present

## 2020-05-28 DIAGNOSIS — Z95828 Presence of other vascular implants and grafts: Secondary | ICD-10-CM

## 2020-05-28 DIAGNOSIS — D6481 Anemia due to antineoplastic chemotherapy: Secondary | ICD-10-CM | POA: Diagnosis not present

## 2020-05-28 DIAGNOSIS — Z170 Estrogen receptor positive status [ER+]: Secondary | ICD-10-CM

## 2020-05-28 DIAGNOSIS — C50412 Malignant neoplasm of upper-outer quadrant of left female breast: Secondary | ICD-10-CM

## 2020-05-28 DIAGNOSIS — Z79899 Other long term (current) drug therapy: Secondary | ICD-10-CM | POA: Diagnosis not present

## 2020-05-28 DIAGNOSIS — C773 Secondary and unspecified malignant neoplasm of axilla and upper limb lymph nodes: Secondary | ICD-10-CM | POA: Diagnosis not present

## 2020-05-28 DIAGNOSIS — K219 Gastro-esophageal reflux disease without esophagitis: Secondary | ICD-10-CM | POA: Diagnosis not present

## 2020-05-28 DIAGNOSIS — T451X5A Adverse effect of antineoplastic and immunosuppressive drugs, initial encounter: Secondary | ICD-10-CM | POA: Diagnosis not present

## 2020-05-28 DIAGNOSIS — I10 Essential (primary) hypertension: Secondary | ICD-10-CM | POA: Diagnosis not present

## 2020-05-28 LAB — COMPREHENSIVE METABOLIC PANEL
ALT: 21 U/L (ref 0–44)
AST: 25 U/L (ref 15–41)
Albumin: 4.1 g/dL (ref 3.5–5.0)
Alkaline Phosphatase: 35 U/L — ABNORMAL LOW (ref 38–126)
Anion gap: 9 (ref 5–15)
BUN: 15 mg/dL (ref 8–23)
CO2: 25 mmol/L (ref 22–32)
Calcium: 9.8 mg/dL (ref 8.9–10.3)
Chloride: 108 mmol/L (ref 98–111)
Creatinine, Ser: 0.91 mg/dL (ref 0.44–1.00)
GFR, Estimated: >60 mL/min (ref >60)
Glucose, Bld: 95 mg/dL (ref 70–99)
Potassium: 3.8 mmol/L (ref 3.5–5.1)
Sodium: 142 mmol/L (ref 135–145)
Total Bilirubin: 0.7 mg/dL (ref 0.3–1.2)
Total Protein: 6.7 g/dL (ref 6.5–8.1)

## 2020-05-28 LAB — CBC WITH DIFFERENTIAL/PLATELET
Abs Immature Granulocytes: 0.07 10*3/uL (ref 0.00–0.07)
Basophils Absolute: 0.1 10*3/uL (ref 0.0–0.1)
Basophils Relative: 3 %
Eosinophils Absolute: 0.3 10*3/uL (ref 0.0–0.5)
Eosinophils Relative: 6 %
HCT: 26.7 % — ABNORMAL LOW (ref 36.0–46.0)
Hemoglobin: 8.8 g/dL — ABNORMAL LOW (ref 12.0–15.0)
Immature Granulocytes: 2 %
Lymphocytes Relative: 15 %
Lymphs Abs: 0.7 10*3/uL (ref 0.7–4.0)
MCH: 32 pg (ref 26.0–34.0)
MCHC: 33 g/dL (ref 30.0–36.0)
MCV: 97.1 fL (ref 80.0–100.0)
Monocytes Absolute: 0.4 10*3/uL (ref 0.1–1.0)
Monocytes Relative: 10 %
Neutro Abs: 2.9 10*3/uL (ref 1.7–7.7)
Neutrophils Relative %: 64 %
Platelets: 307 10*3/uL (ref 150–400)
RBC: 2.75 MIL/uL — ABNORMAL LOW (ref 3.87–5.11)
RDW: 16.3 % — ABNORMAL HIGH (ref 11.5–15.5)
WBC: 4.4 10*3/uL (ref 4.0–10.5)
nRBC: 0 % (ref 0.0–0.2)

## 2020-05-28 MED ORDER — DEXAMETHASONE SODIUM PHOSPHATE 10 MG/ML IJ SOLN
INTRAMUSCULAR | Status: AC
  Filled 2020-05-28: qty 1

## 2020-05-28 MED ORDER — FAMOTIDINE IN NACL 20-0.9 MG/50ML-% IV SOLN
20.0000 mg | Freq: Once | INTRAVENOUS | Status: AC
  Administered 2020-05-28: 20 mg via INTRAVENOUS

## 2020-05-28 MED ORDER — SODIUM CHLORIDE 0.9% FLUSH
10.0000 mL | Freq: Once | INTRAVENOUS | Status: AC
  Administered 2020-05-28: 10 mL
  Filled 2020-05-28: qty 10

## 2020-05-28 MED ORDER — FAMOTIDINE IN NACL 20-0.9 MG/50ML-% IV SOLN
INTRAVENOUS | Status: AC
  Filled 2020-05-28: qty 50

## 2020-05-28 MED ORDER — DEXAMETHASONE SODIUM PHOSPHATE 10 MG/ML IJ SOLN
4.0000 mg | Freq: Once | INTRAMUSCULAR | Status: AC
  Administered 2020-05-28: 4 mg via INTRAVENOUS

## 2020-05-28 MED ORDER — SODIUM CHLORIDE 0.9% FLUSH
10.0000 mL | INTRAVENOUS | Status: DC | PRN
  Administered 2020-05-28: 10 mL
  Filled 2020-05-28: qty 10

## 2020-05-28 MED ORDER — SODIUM CHLORIDE 0.9 % IV SOLN
Freq: Once | INTRAVENOUS | Status: AC
  Filled 2020-05-28: qty 250

## 2020-05-28 MED ORDER — SODIUM CHLORIDE 0.9 % IV SOLN
80.0000 mg/m2 | Freq: Once | INTRAVENOUS | Status: AC
  Administered 2020-05-28: 138 mg via INTRAVENOUS
  Filled 2020-05-28: qty 23

## 2020-05-28 MED ORDER — HEPARIN SOD (PORK) LOCK FLUSH 100 UNIT/ML IV SOLN
500.0000 [IU] | Freq: Once | INTRAVENOUS | Status: AC | PRN
  Administered 2020-05-28: 500 [IU]
  Filled 2020-05-28: qty 5

## 2020-05-28 NOTE — Patient Instructions (Signed)

## 2020-05-28 NOTE — Telephone Encounter (Signed)
Pt called stating [employer] sent her a letter stating her retacrit was not approved and she would have to pay out of pocket. Pt states she was under the impression our office obtained PA for this medication. Pt states [employer] specifies it is not approved unless directly related to BRCA treatment.  This LPN sent a message to our PA team regarding this matter, and I informed pt we would be in touch with her regarding this. Pt verbalized thanks and understanding.

## 2020-05-28 NOTE — Patient Instructions (Signed)
[facility] Discharge Instructions for Patients Receiving Chemotherapy  Today you received the following chemotherapy agents paclitaxel (Taxol) To help prevent nausea and vomiting after your treatment, we encourage you to take your nausea medication as directed   If you develop nausea and vomiting that is not controlled by your nausea medication, call the clinic.   BELOW ARE SYMPTOMS THAT SHOULD BE REPORTED IMMEDIATELY:  *FEVER GREATER THAN 100.5 F  *CHILLS WITH OR WITHOUT FEVER  NAUSEA AND VOMITING THAT IS NOT CONTROLLED WITH YOUR NAUSEA MEDICATION  *UNUSUAL SHORTNESS OF BREATH  *UNUSUAL BRUISING OR BLEEDING  TENDERNESS IN MOUTH AND THROAT WITH OR WITHOUT PRESENCE OF ULCERS  *URINARY PROBLEMS  *BOWEL PROBLEMS  UNUSUAL RASH Items with * indicate a potential emergency and should be followed up as soon as possible.  Feel free to call the clinic should you have any questions or concerns. The clinic phone number is [phone].  Please show the CHEMO ALERT CARD at check-in to the Emergency Department and triage nurse.   

## 2020-05-28 NOTE — Progress Notes (Signed)
Per Dr. Jana Hakim, benadryl IV should be discontinued for taxol treatment today and subsequent taxol doses.

## 2020-05-29 MED ORDER — DIPHENHYDRAMINE HCL 25 MG PO CAPS
ORAL_CAPSULE | ORAL | Status: AC
  Filled 2020-05-29: qty 2

## 2020-05-29 MED ORDER — ACETAMINOPHEN 325 MG PO TABS
ORAL_TABLET | ORAL | Status: AC
  Filled 2020-05-29: qty 2

## 2020-06-03 DIAGNOSIS — U071 COVID-19: Secondary | ICD-10-CM | POA: Diagnosis not present

## 2020-06-04 ENCOUNTER — Encounter: Payer: Self-pay | Admitting: *Deleted

## 2020-06-04 ENCOUNTER — Inpatient Hospital Stay: Payer: Medicare HMO

## 2020-06-04 ENCOUNTER — Inpatient Hospital Stay: Payer: Medicare HMO | Attending: Oncology

## 2020-06-04 ENCOUNTER — Other Ambulatory Visit: Payer: Self-pay

## 2020-06-04 VITALS — BP 133/56 | HR 93 | Temp 98.5°F | Resp 16

## 2020-06-04 DIAGNOSIS — R590 Localized enlarged lymph nodes: Secondary | ICD-10-CM | POA: Insufficient documentation

## 2020-06-04 DIAGNOSIS — Z818 Family history of other mental and behavioral disorders: Secondary | ICD-10-CM | POA: Diagnosis not present

## 2020-06-04 DIAGNOSIS — Z5111 Encounter for antineoplastic chemotherapy: Secondary | ICD-10-CM | POA: Insufficient documentation

## 2020-06-04 DIAGNOSIS — Z23 Encounter for immunization: Secondary | ICD-10-CM | POA: Insufficient documentation

## 2020-06-04 DIAGNOSIS — C773 Secondary and unspecified malignant neoplasm of axilla and upper limb lymph nodes: Secondary | ICD-10-CM | POA: Diagnosis not present

## 2020-06-04 DIAGNOSIS — D649 Anemia, unspecified: Secondary | ICD-10-CM | POA: Insufficient documentation

## 2020-06-04 DIAGNOSIS — Z170 Estrogen receptor positive status [ER+]: Secondary | ICD-10-CM | POA: Insufficient documentation

## 2020-06-04 DIAGNOSIS — Z87442 Personal history of urinary calculi: Secondary | ICD-10-CM | POA: Diagnosis not present

## 2020-06-04 DIAGNOSIS — Z79899 Other long term (current) drug therapy: Secondary | ICD-10-CM | POA: Diagnosis not present

## 2020-06-04 DIAGNOSIS — R5383 Other fatigue: Secondary | ICD-10-CM | POA: Insufficient documentation

## 2020-06-04 DIAGNOSIS — I10 Essential (primary) hypertension: Secondary | ICD-10-CM | POA: Insufficient documentation

## 2020-06-04 DIAGNOSIS — C50412 Malignant neoplasm of upper-outer quadrant of left female breast: Secondary | ICD-10-CM | POA: Diagnosis not present

## 2020-06-04 DIAGNOSIS — Z95828 Presence of other vascular implants and grafts: Secondary | ICD-10-CM

## 2020-06-04 DIAGNOSIS — G629 Polyneuropathy, unspecified: Secondary | ICD-10-CM | POA: Diagnosis not present

## 2020-06-04 DIAGNOSIS — Z8249 Family history of ischemic heart disease and other diseases of the circulatory system: Secondary | ICD-10-CM | POA: Insufficient documentation

## 2020-06-04 DIAGNOSIS — K219 Gastro-esophageal reflux disease without esophagitis: Secondary | ICD-10-CM | POA: Insufficient documentation

## 2020-06-04 DIAGNOSIS — Z803 Family history of malignant neoplasm of breast: Secondary | ICD-10-CM | POA: Insufficient documentation

## 2020-06-04 LAB — COMPREHENSIVE METABOLIC PANEL
ALT: 17 U/L (ref 0–44)
AST: 22 U/L (ref 15–41)
Albumin: 4.1 g/dL (ref 3.5–5.0)
Alkaline Phosphatase: 36 U/L — ABNORMAL LOW (ref 38–126)
Anion gap: 6 (ref 5–15)
BUN: 15 mg/dL (ref 8–23)
CO2: 25 mmol/L (ref 22–32)
Calcium: 9.3 mg/dL (ref 8.9–10.3)
Chloride: 109 mmol/L (ref 98–111)
Creatinine, Ser: 0.83 mg/dL (ref 0.44–1.00)
GFR, Estimated: >60 mL/min (ref >60)
Glucose, Bld: 121 mg/dL — ABNORMAL HIGH (ref 70–99)
Potassium: 3.7 mmol/L (ref 3.5–5.1)
Sodium: 140 mmol/L (ref 135–145)
Total Bilirubin: 0.6 mg/dL (ref 0.3–1.2)
Total Protein: 6.6 g/dL (ref 6.5–8.1)

## 2020-06-04 LAB — CBC WITH DIFFERENTIAL/PLATELET
Abs Immature Granulocytes: 0.02 10*3/uL (ref 0.00–0.07)
Basophils Absolute: 0.1 10*3/uL (ref 0.0–0.1)
Basophils Relative: 2 %
Eosinophils Absolute: 0.2 10*3/uL (ref 0.0–0.5)
Eosinophils Relative: 7 %
HCT: 26.4 % — ABNORMAL LOW (ref 36.0–46.0)
Hemoglobin: 8.7 g/dL — ABNORMAL LOW (ref 12.0–15.0)
Immature Granulocytes: 1 %
Lymphocytes Relative: 20 %
Lymphs Abs: 0.6 10*3/uL — ABNORMAL LOW (ref 0.7–4.0)
MCH: 32.2 pg (ref 26.0–34.0)
MCHC: 33 g/dL (ref 30.0–36.0)
MCV: 97.8 fL (ref 80.0–100.0)
Monocytes Absolute: 0.4 10*3/uL (ref 0.1–1.0)
Monocytes Relative: 13 %
Neutro Abs: 1.8 10*3/uL (ref 1.7–7.7)
Neutrophils Relative %: 57 %
Platelets: 273 10*3/uL (ref 150–400)
RBC: 2.7 MIL/uL — ABNORMAL LOW (ref 3.87–5.11)
RDW: 15.4 % (ref 11.5–15.5)
WBC: 3.1 10*3/uL — ABNORMAL LOW (ref 4.0–10.5)
nRBC: 0 % (ref 0.0–0.2)

## 2020-06-04 MED ORDER — DEXAMETHASONE SODIUM PHOSPHATE 10 MG/ML IJ SOLN
INTRAMUSCULAR | Status: AC
  Filled 2020-06-04: qty 1

## 2020-06-04 MED ORDER — FAMOTIDINE IN NACL 20-0.9 MG/50ML-% IV SOLN
20.0000 mg | Freq: Once | INTRAVENOUS | Status: AC
  Administered 2020-06-04: 20 mg via INTRAVENOUS

## 2020-06-04 MED ORDER — SODIUM CHLORIDE 0.9 % IV SOLN
80.0000 mg/m2 | Freq: Once | INTRAVENOUS | Status: AC
  Administered 2020-06-04: 138 mg via INTRAVENOUS
  Filled 2020-06-04: qty 23

## 2020-06-04 MED ORDER — HEPARIN SOD (PORK) LOCK FLUSH 100 UNIT/ML IV SOLN
500.0000 [IU] | Freq: Once | INTRAVENOUS | Status: AC | PRN
  Administered 2020-06-04: 500 [IU]
  Filled 2020-06-04: qty 5

## 2020-06-04 MED ORDER — SODIUM CHLORIDE 0.9 % IV SOLN
Freq: Once | INTRAVENOUS | Status: AC
  Filled 2020-06-04: qty 250

## 2020-06-04 MED ORDER — FAMOTIDINE IN NACL 20-0.9 MG/50ML-% IV SOLN
INTRAVENOUS | Status: AC
  Filled 2020-06-04: qty 50

## 2020-06-04 MED ORDER — DEXAMETHASONE SODIUM PHOSPHATE 10 MG/ML IJ SOLN
4.0000 mg | Freq: Once | INTRAMUSCULAR | Status: AC
  Administered 2020-06-04: 4 mg via INTRAVENOUS

## 2020-06-04 MED ORDER — SODIUM CHLORIDE 0.9% FLUSH
10.0000 mL | Freq: Once | INTRAVENOUS | Status: AC
  Administered 2020-06-04: 10 mL
  Filled 2020-06-04: qty 10

## 2020-06-04 MED ORDER — SODIUM CHLORIDE 0.9% FLUSH
10.0000 mL | INTRAVENOUS | Status: DC | PRN
  Administered 2020-06-04: 10 mL
  Filled 2020-06-04: qty 10

## 2020-06-04 NOTE — Patient Instructions (Signed)
[facility] Discharge Instructions for Patients Receiving Chemotherapy  Today you received the following chemotherapy agents: paclitaxel.  To help prevent nausea and vomiting after your treatment, we encourage you to take your nausea medication as directed.   If you develop nausea and vomiting that is not controlled by your nausea medication, call the clinic.   BELOW ARE SYMPTOMS THAT SHOULD BE REPORTED IMMEDIATELY:  *FEVER GREATER THAN 100.5 F  *CHILLS WITH OR WITHOUT FEVER  NAUSEA AND VOMITING THAT IS NOT CONTROLLED WITH YOUR NAUSEA MEDICATION  *UNUSUAL SHORTNESS OF BREATH  *UNUSUAL BRUISING OR BLEEDING  TENDERNESS IN MOUTH AND THROAT WITH OR WITHOUT PRESENCE OF ULCERS  *URINARY PROBLEMS  *BOWEL PROBLEMS  UNUSUAL RASH Items with * indicate a potential emergency and should be followed up as soon as possible.  Feel free to call the clinic should you have any questions or concerns. The clinic phone number is [phone].  Please show the CHEMO ALERT CARD at check-in to the Emergency Department and triage nurse.   

## 2020-06-11 ENCOUNTER — Other Ambulatory Visit: Payer: Self-pay | Admitting: Oncology

## 2020-06-11 ENCOUNTER — Other Ambulatory Visit: Payer: Self-pay

## 2020-06-11 ENCOUNTER — Inpatient Hospital Stay: Payer: Medicare HMO

## 2020-06-11 VITALS — BP 142/59 | HR 72 | Temp 97.9°F | Resp 18

## 2020-06-11 DIAGNOSIS — R590 Localized enlarged lymph nodes: Secondary | ICD-10-CM | POA: Diagnosis not present

## 2020-06-11 DIAGNOSIS — C50412 Malignant neoplasm of upper-outer quadrant of left female breast: Secondary | ICD-10-CM | POA: Diagnosis not present

## 2020-06-11 DIAGNOSIS — K219 Gastro-esophageal reflux disease without esophagitis: Secondary | ICD-10-CM | POA: Diagnosis not present

## 2020-06-11 DIAGNOSIS — Z170 Estrogen receptor positive status [ER+]: Secondary | ICD-10-CM | POA: Diagnosis not present

## 2020-06-11 DIAGNOSIS — Z5111 Encounter for antineoplastic chemotherapy: Secondary | ICD-10-CM | POA: Diagnosis not present

## 2020-06-11 DIAGNOSIS — C773 Secondary and unspecified malignant neoplasm of axilla and upper limb lymph nodes: Secondary | ICD-10-CM | POA: Diagnosis not present

## 2020-06-11 DIAGNOSIS — Z23 Encounter for immunization: Secondary | ICD-10-CM | POA: Diagnosis not present

## 2020-06-11 DIAGNOSIS — I10 Essential (primary) hypertension: Secondary | ICD-10-CM | POA: Diagnosis not present

## 2020-06-11 DIAGNOSIS — R5383 Other fatigue: Secondary | ICD-10-CM | POA: Diagnosis not present

## 2020-06-11 DIAGNOSIS — Z95828 Presence of other vascular implants and grafts: Secondary | ICD-10-CM

## 2020-06-11 LAB — CBC WITH DIFFERENTIAL/PLATELET
Abs Immature Granulocytes: 0.01 10*3/uL (ref 0.00–0.07)
Basophils Absolute: 0.1 10*3/uL (ref 0.0–0.1)
Basophils Relative: 3 %
Eosinophils Absolute: 0.2 10*3/uL (ref 0.0–0.5)
Eosinophils Relative: 7 %
HCT: 27.5 % — ABNORMAL LOW (ref 36.0–46.0)
Hemoglobin: 9 g/dL — ABNORMAL LOW (ref 12.0–15.0)
Immature Granulocytes: 0 %
Lymphocytes Relative: 20 %
Lymphs Abs: 0.6 10*3/uL — ABNORMAL LOW (ref 0.7–4.0)
MCH: 31.7 pg (ref 26.0–34.0)
MCHC: 32.7 g/dL (ref 30.0–36.0)
MCV: 96.8 fL (ref 80.0–100.0)
Monocytes Absolute: 0.3 10*3/uL (ref 0.1–1.0)
Monocytes Relative: 10 %
Neutro Abs: 2 10*3/uL (ref 1.7–7.7)
Neutrophils Relative %: 60 %
Platelets: 283 10*3/uL (ref 150–400)
RBC: 2.84 MIL/uL — ABNORMAL LOW (ref 3.87–5.11)
RDW: 14.9 % (ref 11.5–15.5)
WBC: 3.2 10*3/uL — ABNORMAL LOW (ref 4.0–10.5)
nRBC: 0 % (ref 0.0–0.2)

## 2020-06-11 LAB — COMPREHENSIVE METABOLIC PANEL
ALT: 17 U/L (ref 0–44)
AST: 23 U/L (ref 15–41)
Albumin: 4.1 g/dL (ref 3.5–5.0)
Alkaline Phosphatase: 36 U/L — ABNORMAL LOW (ref 38–126)
Anion gap: 6 (ref 5–15)
BUN: 16 mg/dL (ref 8–23)
CO2: 26 mmol/L (ref 22–32)
Calcium: 9.5 mg/dL (ref 8.9–10.3)
Chloride: 107 mmol/L (ref 98–111)
Creatinine, Ser: 0.9 mg/dL (ref 0.44–1.00)
GFR, Estimated: >60 mL/min (ref >60)
Glucose, Bld: 119 mg/dL — ABNORMAL HIGH (ref 70–99)
Potassium: 3.8 mmol/L (ref 3.5–5.1)
Sodium: 139 mmol/L (ref 135–145)
Total Bilirubin: 0.6 mg/dL (ref 0.3–1.2)
Total Protein: 6.6 g/dL (ref 6.5–8.1)

## 2020-06-11 MED ORDER — SODIUM CHLORIDE 0.9% FLUSH
10.0000 mL | Freq: Once | INTRAVENOUS | Status: AC
  Administered 2020-06-11: 10 mL
  Filled 2020-06-11: qty 10

## 2020-06-11 MED ORDER — FAMOTIDINE IN NACL 20-0.9 MG/50ML-% IV SOLN
20.0000 mg | Freq: Once | INTRAVENOUS | Status: AC
  Administered 2020-06-11: 20 mg via INTRAVENOUS

## 2020-06-11 MED ORDER — SODIUM CHLORIDE 0.9 % IV SOLN
Freq: Once | INTRAVENOUS | Status: AC
  Filled 2020-06-11: qty 250

## 2020-06-11 MED ORDER — DEXAMETHASONE SODIUM PHOSPHATE 10 MG/ML IJ SOLN
INTRAMUSCULAR | Status: AC
  Filled 2020-06-11: qty 1

## 2020-06-11 MED ORDER — FAMOTIDINE IN NACL 20-0.9 MG/50ML-% IV SOLN
INTRAVENOUS | Status: AC
  Filled 2020-06-11: qty 50

## 2020-06-11 MED ORDER — DEXAMETHASONE SODIUM PHOSPHATE 10 MG/ML IJ SOLN
4.0000 mg | Freq: Once | INTRAMUSCULAR | Status: AC
  Administered 2020-06-11: 4 mg via INTRAVENOUS

## 2020-06-11 MED ORDER — HEPARIN SOD (PORK) LOCK FLUSH 100 UNIT/ML IV SOLN
500.0000 [IU] | Freq: Once | INTRAVENOUS | Status: AC | PRN
  Administered 2020-06-11: 500 [IU]
  Filled 2020-06-11: qty 5

## 2020-06-11 MED ORDER — SODIUM CHLORIDE 0.9 % IV SOLN
80.0000 mg/m2 | Freq: Once | INTRAVENOUS | Status: AC
  Administered 2020-06-11: 138 mg via INTRAVENOUS
  Filled 2020-06-11: qty 23

## 2020-06-11 MED ORDER — SODIUM CHLORIDE 0.9% FLUSH
10.0000 mL | INTRAVENOUS | Status: DC | PRN
  Administered 2020-06-11: 10 mL
  Filled 2020-06-11: qty 10

## 2020-06-11 NOTE — Patient Instructions (Signed)
[facility] Discharge Instructions for Patients Receiving Chemotherapy  Today you received the following chemotherapy agents: paclitaxel.  To help prevent nausea and vomiting after your treatment, we encourage you to take your nausea medication as directed.   If you develop nausea and vomiting that is not controlled by your nausea medication, call the clinic.   BELOW ARE SYMPTOMS THAT SHOULD BE REPORTED IMMEDIATELY:  *FEVER GREATER THAN 100.5 F  *CHILLS WITH OR WITHOUT FEVER  NAUSEA AND VOMITING THAT IS NOT CONTROLLED WITH YOUR NAUSEA MEDICATION  *UNUSUAL SHORTNESS OF BREATH  *UNUSUAL BRUISING OR BLEEDING  TENDERNESS IN MOUTH AND THROAT WITH OR WITHOUT PRESENCE OF ULCERS  *URINARY PROBLEMS  *BOWEL PROBLEMS  UNUSUAL RASH Items with * indicate a potential emergency and should be followed up as soon as possible.  Feel free to call the clinic should you have any questions or concerns. The clinic phone number is [phone].  Please show the CHEMO ALERT CARD at check-in to the Emergency Department and triage nurse.   

## 2020-06-17 NOTE — Progress Notes (Signed)
[place]  [phone] [phone]) [phone]     ID: Cynthia Vasquez DOB: 21-May-1946  MR#: [id number]  SUP#:[id number]  Patient Care Team: Sueanne Margarita, DO as PCP - General (Internal Medicine) Rockwell Germany, RN as Oncology Nurse Navigator Mauro Kaufmann, RN as Oncology Nurse Navigator Theresa Dohrman, Virgie Dad, MD as Consulting Physician (Oncology) Alphonsa Overall, MD as Consulting Physician (General Surgery) Kyung Rudd, MD as Consulting Physician (Radiation Oncology) Wilford Corner, MD as Consulting Physician (Gastroenterology) Chauncey Cruel, MD OTHER MD:  CHIEF COMPLAINT: Estrogen receptor positive breast cancer  CURRENT TREATMENT: adjuvant chemotherapy   INTERVAL HISTORY: Cynthia Vasquez returns today for follow-up and treatment of her estrogen receptor positive breast cancer.   She began weekly paclitaxel on 04/30/2020.  Today is week 8.  She is still generally fatigued but is trying to do more.  She has been raking leaves sweeping and taking 30-minute walks and then crashing.  She was having some nosebleeds.  She is some [place] and that is improved.  So far she has had no peripheral neuropathy issues.  Because of a very low hemoglobin and symptoms she received a unit of packed red cells on 05/14/2020.  Her hemoglobin remains low. Lab Results  Component Value Date   HGB 9.2 (L) 06/18/2020   HGB 9.0 (L) 06/11/2020   HGB 8.7 (L) 06/04/2020   HGB 8.8 (L) 05/28/2020   HGB 8.3 (L) 05/21/2020   Lab Results  Component Value Date   CA2729 10.4 02/13/2020    REVIEW OF SYSTEMS: A detailed review of systems today was otherwise stable except as noted above   COVID 19 VACCINATION STATUS: Status post [place] x2, with booster pending   HISTORY OF CURRENT ILLNESS: From the original intake note:  Sharonda herself palpated a left breast mass, with associated dimpling, tenderness, and pinching pain. She underwent bilateral diagnostic mammography with tomography and  left breast ultrasonography at [facility] on 11/09/2019 showing: breast density category C; 2.9 cm irregular mass in left breast at 2:30 corresponding to palpable abnormality; three enlarged left axillary lymph nodes.  Accordingly on 11/17/2019 she proceeded to biopsy of the left breast area in question. The pathology from this procedure ([id number]) showed: invasive mammary carcinoma, grade 2, e-cadherin positive. Prognostic indicators significant for: estrogen receptor, 95% positive and progesterone receptor, 95% positive, both with strong staining intensity. Proliferation marker Ki67 at 30%. HER2 negative by immunohistochemistry (1+).  The biopsied lymph node was positive for metastatic carcinoma.  The patient's subsequent history is as detailed below.   PAST MEDICAL HISTORY: Past Medical History:  Diagnosis Date  . Atrial septal defect    repaired age 22  . Cancer ([place]) 01/2020   IDC left breast  . GERD (gastroesophageal reflux disease)   . Glaucoma   . Headache    migraines until age 84- 44, have them 3-4 x year  . History of blood transfusion    with ASD surgery at age 24  . History of kidney stones    passed stones  . Hypertension   . Vitamin D deficiency     PAST SURGICAL HISTORY: Past Surgical History:  Procedure Laterality Date  . ASD REPAIR     age 15  . BREAST LUMPECTOMY WITH RADIOACTIVE SEED AND AXILLARY LYMPH NODE DISSECTION Left 01/12/2020   Procedure: LEFT BREAST LUMPECTOMY WITH RADIOACTIVE SEED AND LEFT AXILLARY LYMPH NODE DISSECTION;  Surgeon: Alphonsa Overall, MD;  Location: [facility];  Service: General;  Laterality: Left;  PEC BLOCK  .  COLONOSCOPY    . PORTACATH PLACEMENT Right 02/02/2020   Procedure: INSERTION PORT-A-CATH WITH ULTRASOUND GUIDANCE;  Surgeon: Alphonsa Overall, MD;  Location: [place] [facility];  Service: General;  Laterality: Right;  . UPPER GI ENDOSCOPY      FAMILY HISTORY: Family History  Problem Relation Age of Onset  . Dementia  Mother   . Heart attack Father   . Hypertension Father   . Breast cancer Sister 52  The patient's father died at age 73 from a myocardial infarction.  The patient's mother died at 9 with Alzheimer's disease.  The patient has 2 brothers, 2 sisters.  1 sister developed breast cancer at the age of 49.  The patient's daughter has a history of thyroid cancer.  She has not been genetically tested.  There is no other history of cancer in the family to the patient's knowledge.   GYNECOLOGIC HISTORY:  No LMP recorded (lmp unknown). Patient is postmenopausal. Menarche: 74 years old Age at first live birth: 74 years old [place] P 3 LMP age 58 Contraceptive approximately 2 years, with no complications HRT no  Hysterectomy?  No BSO?  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  Her husband [name] (goes by [employer]") is also a retired Optometrist.  Son [name] 61 lives in [place] and works for a company that processes [employer] for [employer], but he completed a masters in Chief Executive Officer and is looking for a different job; daughter [name] lives in [place] and is a Oncologist; son [name] lives in [place] and runs a business that puts and tennis on cell towers.  The patient has 8 grandchildren.  She attends our [employer].    ADVANCED DIRECTIVES: In the absence of any documents to the contrary the patient's husband is her healthcare power of attorney   HEALTH MAINTENANCE: Social History   Tobacco Use  . Smoking status: Never Smoker  . Smokeless tobacco: Never Used  Vaping Use  . Vaping Use: Never used  Substance Use Topics  . Alcohol use: No  . Drug use: No     Colonoscopy: "Less than 10 years ago"; [facility]  PAP: Remote  Bone density: Never   No Known Allergies  Current Outpatient Medications  Medication Sig Dispense Refill  . acetaminophen (TYLENOL) 500 MG tablet Take 500 mg by mouth  every 6 (six) hours as needed for mild pain.     Marland Kitchen acetaminophen (TYLENOL) 500 MG tablet Take 1 tablet (500 mg total) by mouth every 8 (eight) hours as needed. Take with aleve 220 md 60 tablet 0  . b complex vitamins tablet Take 1 tablet by mouth 3 (three) times a week.    . calcium gluconate 500 MG tablet Take 1 tablet (500 mg total) by mouth 3 (three) times daily.    . cholecalciferol (VITAMIN D) 1000 UNITS tablet Take 1,000 Units by mouth daily.    Marland Kitchen latanoprost (XALATAN) 0.005 % ophthalmic solution Place 1 drop into both eyes at bedtime.    . lidocaine-prilocaine (EMLA) cream Apply to affected area once 30 g 3  . Multiple Vitamins-Minerals (MULTIVITAMIN WITH MINERALS) tablet Take 1 tablet by mouth daily.    . naproxen sodium (ALEVE) 220 MG tablet Take 1 tablet (220 mg total) by mouth every 8 (eight) hours as needed. Take with tylenol 500 mg. 60 tablet 3  . ondansetron (ZOFRAN) 8 MG tablet Take 1 tablet (  8 mg total) by mouth every 8 (eight) hours as needed for nausea or vomiting. Start on day 3 after chemo if needed 20 tablet 0  . pantoprazole (PROTONIX) 40 MG tablet Take 40 mg by mouth every other day.     . prochlorperazine (COMPAZINE) 10 MG tablet Take 1 tablet (10 mg total) by mouth 4 (four) times daily -  before meals and at bedtime. Star the evening of chemotherapy and continue for 3 days 30 tablet 1   No current facility-administered medications for this visit.    OBJECTIVE: White woman who appears stated age  Vitals:   06/18/20 0855  BP: 134/65  Pulse: 97  Resp: 18  Temp: (!) 97 F (36.1 C)  SpO2: 99%   Wt Readings from Last 3 Encounters:  06/18/20 149 lb 6.4 oz (67.8 kg)  05/28/20 150 lb 11.2 oz (68.4 kg)  05/21/20 154 lb (69.9 kg)   Body mass index is 30.18 kg/m.    ECOG FS:1 - Symptomatic but completely ambulatory  Sclerae unicteric, EOMs intact Wearing a mask No cervical or supraclavicular adenopathy Lungs no rales or rhonchi Heart regular rate and rhythm Abd  soft, nontender, positive bowel sounds MSK no focal spinal tenderness, no upper extremity lymphedema Neuro: nonfocal, well oriented, appropriate affect Breasts: Deferred   LAB RESULTS:  CMP     Component Value Date/Time   NA 139 06/11/2020 0806   K 3.8 06/11/2020 0806   CL 107 06/11/2020 0806   CO2 26 06/11/2020 0806   GLUCOSE 119 (H) 06/11/2020 0806   BUN 16 06/11/2020 0806   CREATININE 0.90 06/11/2020 0806   CREATININE 1.06 (H) 11/29/2019 0845   CALCIUM 9.5 06/11/2020 0806   PROT 6.6 06/11/2020 0806   ALBUMIN 4.1 06/11/2020 0806   AST 23 06/11/2020 0806   AST 23 11/29/2019 0845   ALT 17 06/11/2020 0806   ALT 17 11/29/2019 0845   ALKPHOS 36 (L) 06/11/2020 0806   BILITOT 0.6 06/11/2020 0806   BILITOT 0.6 11/29/2019 0845   GFRNONAA >60 06/11/2020 0806   GFRNONAA 52 (L) 11/29/2019 0845   GFRAA 58 (L) 04/30/2020 1020   GFRAA >60 11/29/2019 0845    No results found for: TOTALPROTELP, ALBUMINELP, A1GS, A2GS, BETS, BETA2SER, GAMS, MSPIKE, SPEI  Lab Results  Component Value Date   WBC 3.5 (L) 06/18/2020   NEUTROABS 2.3 06/18/2020   HGB 9.2 (L) 06/18/2020   HCT 28.6 (L) 06/18/2020   MCV 97.9 06/18/2020   PLT 278 06/18/2020    No results found for: LABCA2  No components found for: HYQMVH846  No results for input(s): INR in the last 168 hours.  No results found for: LABCA2  No results found for: NGE952  No results found for: WUX324  No results found for: MWN027  Lab Results  Component Value Date   CA2729 10.4 02/13/2020    No components found for: HGQUANT  No results found for: CEA1 / No results found for: CEA1   No results found for: AFPTUMOR  No results found for: CHROMOGRNA  No results found for: KPAFRELGTCHN, LAMBDASER, KAPLAMBRATIO (kappa/lambda light chains)  No results found for: HGBA, HGBA2QUANT, HGBFQUANT, HGBSQUAN (Hemoglobinopathy evaluation)   Lab Results  Component Value Date   LDH 230 (H) 06/28/2019    No results found for:  IRON, TIBC, IRONPCTSAT (Iron and TIBC)  Lab Results  Component Value Date   FERRITIN 626 (H) 07/03/2019    Urinalysis    Component Value Date/Time   COLORURINE YELLOW 06/11/2013  2007   APPEARANCEUR CLEAR 06/11/2013 2007   LABSPEC 1.017 06/11/2013 2007   PHURINE 6.5 06/11/2013 2007   GLUCOSEU NEGATIVE 06/11/2013 2007   HGBUR NEGATIVE 06/11/2013 2007   BILIRUBINUR NEGATIVE 06/11/2013 2007   [place] NEGATIVE 06/11/2013 2007   PROTEINUR NEGATIVE 06/11/2013 2007   UROBILINOGEN 0.2 06/11/2013 2007   NITRITE NEGATIVE 06/11/2013 2007   LEUKOCYTESUR NEGATIVE 06/11/2013 2007    STUDIES: No results found.   ELIGIBLE FOR AVAILABLE RESEARCH PROTOCOL: AET  ASSESSMENT: 74 y.o. [place] woman status post left breast upper outer quadrant biopsy 11/17/2019 for a clinical T2 N1, stage IIA invasive ductal carcinoma, grade 2, estrogen and progesterone receptor positive, HER-2 not amplified, with an MIB-1 of 30%.  (1) status post left lumpectomy and axillary lymph node dissection 01/12/2020 for a pT2 pN2, stage IIIA invasive ductal carcinoma, grade 3, with negative margins  (a) a total of 14 axillary lymph nodes removed, 8 positive, with extracapsular extension  (2) adjuvant chemotherapy consisting of doxorubicin and cyclophosphamide in dose dense fashion x4 started 02/13/2020, completed 04/10/2020, followed by weekly paclitaxel x12 started 04/30/2020  (a) echocardiogram 02/08/2020 showed an ejection fraction in the 65/70%  (b) final 2 cycles of doxorubicin/cyclophosphamide given 21 days apart and 18% dose reduced  (3) adjuvant radiation to follow  (4) antiestrogens to start at the completion of local treatment   PLAN: Thurza has had no peripheral neuropathy and so far and her hemoglobin continues to slowly improve.  By the time he gets to 10 hopefully she will have a little bit more on the.  She is strongly considering not receiving the last 4 cycles.  My best guess is that that would  mean foregoing about 1% risk reduction but of course we do not have exact date for that.  If she decides against it she will then proceed to radiation likely after Thanksgiving's.  She will make the decision between now and next week and she is scheduled for treatment next week and to see me in 2 weeks unless she changes her mind  Total encounter time 20 minutes.Sarajane Jews C. Pranika Finks, MD 06/18/2020 9:12 AM Medical Oncology and Hematology [facility] [place], [place] [address] Tel. [phone]    Fax. [phone]   I, Wilburn Mylar, am acting as scribe for Dr. Virgie Dad. Anesa Fronek.  I, Lurline Del MD, have reviewed the above documentation for accuracy and completeness, and I agree with the above.   *Total Encounter Time as defined by the Centers for Medicare and Medicaid Services includes, in addition to the face-to-face time of a patient visit (documented in the note above) non-face-to-face time: obtaining and reviewing outside history, ordering and reviewing medications, tests or procedures, care coordination (communications with other health care professionals or caregivers) and documentation in the medical record.

## 2020-06-18 ENCOUNTER — Inpatient Hospital Stay: Payer: Medicare HMO

## 2020-06-18 ENCOUNTER — Inpatient Hospital Stay: Payer: Medicare HMO | Admitting: Oncology

## 2020-06-18 ENCOUNTER — Encounter: Payer: Self-pay | Admitting: *Deleted

## 2020-06-18 ENCOUNTER — Other Ambulatory Visit: Payer: Self-pay

## 2020-06-18 VITALS — BP 134/65 | HR 97 | Temp 97.0°F | Resp 18 | Ht 59.0 in | Wt 149.4 lb

## 2020-06-18 DIAGNOSIS — K219 Gastro-esophageal reflux disease without esophagitis: Secondary | ICD-10-CM | POA: Diagnosis not present

## 2020-06-18 DIAGNOSIS — Z170 Estrogen receptor positive status [ER+]: Secondary | ICD-10-CM

## 2020-06-18 DIAGNOSIS — C50412 Malignant neoplasm of upper-outer quadrant of left female breast: Secondary | ICD-10-CM | POA: Diagnosis not present

## 2020-06-18 DIAGNOSIS — R590 Localized enlarged lymph nodes: Secondary | ICD-10-CM | POA: Diagnosis not present

## 2020-06-18 DIAGNOSIS — Z95828 Presence of other vascular implants and grafts: Secondary | ICD-10-CM

## 2020-06-18 DIAGNOSIS — Z23 Encounter for immunization: Secondary | ICD-10-CM | POA: Diagnosis not present

## 2020-06-18 DIAGNOSIS — I10 Essential (primary) hypertension: Secondary | ICD-10-CM | POA: Diagnosis not present

## 2020-06-18 DIAGNOSIS — Z5111 Encounter for antineoplastic chemotherapy: Secondary | ICD-10-CM | POA: Diagnosis not present

## 2020-06-18 DIAGNOSIS — R5383 Other fatigue: Secondary | ICD-10-CM | POA: Diagnosis not present

## 2020-06-18 DIAGNOSIS — C773 Secondary and unspecified malignant neoplasm of axilla and upper limb lymph nodes: Secondary | ICD-10-CM | POA: Diagnosis not present

## 2020-06-18 LAB — COMPREHENSIVE METABOLIC PANEL
ALT: 17 U/L (ref 0–44)
AST: 23 U/L (ref 15–41)
Albumin: 4.1 g/dL (ref 3.5–5.0)
Alkaline Phosphatase: 36 U/L — ABNORMAL LOW (ref 38–126)
Anion gap: 9 (ref 5–15)
BUN: 17 mg/dL (ref 8–23)
CO2: 24 mmol/L (ref 22–32)
Calcium: 9.4 mg/dL (ref 8.9–10.3)
Chloride: 108 mmol/L (ref 98–111)
Creatinine, Ser: 0.91 mg/dL (ref 0.44–1.00)
GFR, Estimated: >60 mL/min (ref >60)
Glucose, Bld: 116 mg/dL — ABNORMAL HIGH (ref 70–99)
Potassium: 3.9 mmol/L (ref 3.5–5.1)
Sodium: 141 mmol/L (ref 135–145)
Total Bilirubin: 0.6 mg/dL (ref 0.3–1.2)
Total Protein: 6.8 g/dL (ref 6.5–8.1)

## 2020-06-18 LAB — CBC WITH DIFFERENTIAL/PLATELET
Abs Immature Granulocytes: 0.02 10*3/uL (ref 0.00–0.07)
Basophils Absolute: 0.1 10*3/uL (ref 0.0–0.1)
Basophils Relative: 2 %
Eosinophils Absolute: 0.2 10*3/uL (ref 0.0–0.5)
Eosinophils Relative: 6 %
HCT: 28.6 % — ABNORMAL LOW (ref 36.0–46.0)
Hemoglobin: 9.2 g/dL — ABNORMAL LOW (ref 12.0–15.0)
Immature Granulocytes: 1 %
Lymphocytes Relative: 18 %
Lymphs Abs: 0.6 10*3/uL — ABNORMAL LOW (ref 0.7–4.0)
MCH: 31.5 pg (ref 26.0–34.0)
MCHC: 32.2 g/dL (ref 30.0–36.0)
MCV: 97.9 fL (ref 80.0–100.0)
Monocytes Absolute: 0.3 10*3/uL (ref 0.1–1.0)
Monocytes Relative: 10 %
Neutro Abs: 2.3 10*3/uL (ref 1.7–7.7)
Neutrophils Relative %: 63 %
Platelets: 278 10*3/uL (ref 150–400)
RBC: 2.92 MIL/uL — ABNORMAL LOW (ref 3.87–5.11)
RDW: 14.8 % (ref 11.5–15.5)
WBC: 3.5 10*3/uL — ABNORMAL LOW (ref 4.0–10.5)
nRBC: 0 % (ref 0.0–0.2)

## 2020-06-18 MED ORDER — SODIUM CHLORIDE 0.9% FLUSH
10.0000 mL | Freq: Once | INTRAVENOUS | Status: AC
  Administered 2020-06-18: 10 mL
  Filled 2020-06-18: qty 10

## 2020-06-18 MED ORDER — SODIUM CHLORIDE 0.9 % IV SOLN
Freq: Once | INTRAVENOUS | Status: AC
  Filled 2020-06-18: qty 250

## 2020-06-18 MED ORDER — FAMOTIDINE IN NACL 20-0.9 MG/50ML-% IV SOLN
20.0000 mg | Freq: Once | INTRAVENOUS | Status: AC
  Administered 2020-06-18: 20 mg via INTRAVENOUS

## 2020-06-18 MED ORDER — INFLUENZA VAC A&B SA ADJ QUAD 0.5 ML IM PRSY
PREFILLED_SYRINGE | INTRAMUSCULAR | Status: AC
  Filled 2020-06-18: qty 0.5

## 2020-06-18 MED ORDER — FAMOTIDINE IN NACL 20-0.9 MG/50ML-% IV SOLN
INTRAVENOUS | Status: AC
  Filled 2020-06-18: qty 50

## 2020-06-18 MED ORDER — SODIUM CHLORIDE 0.9% FLUSH
10.0000 mL | INTRAVENOUS | Status: DC | PRN
  Administered 2020-06-18: 10 mL
  Filled 2020-06-18: qty 10

## 2020-06-18 MED ORDER — SODIUM CHLORIDE 0.9 % IV SOLN
80.0000 mg/m2 | Freq: Once | INTRAVENOUS | Status: AC
  Administered 2020-06-18: 138 mg via INTRAVENOUS
  Filled 2020-06-18: qty 23

## 2020-06-18 MED ORDER — HEPARIN SOD (PORK) LOCK FLUSH 100 UNIT/ML IV SOLN
500.0000 [IU] | Freq: Once | INTRAVENOUS | Status: AC | PRN
  Administered 2020-06-18: 500 [IU]
  Filled 2020-06-18: qty 5

## 2020-06-18 MED ORDER — INFLUENZA VAC A&B SA ADJ QUAD 0.5 ML IM PRSY
0.5000 mL | PREFILLED_SYRINGE | Freq: Once | INTRAMUSCULAR | Status: AC
  Administered 2020-06-18: 0.5 mL via INTRAMUSCULAR

## 2020-06-18 MED ORDER — DEXAMETHASONE SODIUM PHOSPHATE 10 MG/ML IJ SOLN
INTRAMUSCULAR | Status: AC
  Filled 2020-06-18: qty 1

## 2020-06-18 MED ORDER — DEXAMETHASONE SODIUM PHOSPHATE 10 MG/ML IJ SOLN
4.0000 mg | Freq: Once | INTRAMUSCULAR | Status: AC
  Administered 2020-06-18: 4 mg via INTRAVENOUS

## 2020-06-18 NOTE — Patient Instructions (Signed)

## 2020-06-18 NOTE — Patient Instructions (Signed)
[facility] Discharge Instructions for Patients Receiving Chemotherapy  Today you received the following chemotherapy agents taxol  To help prevent nausea and vomiting after your treatment, we encourage you to take your nausea medication as directed   If you develop nausea and vomiting that is not controlled by your nausea medication, call the clinic.   BELOW ARE SYMPTOMS THAT SHOULD BE REPORTED IMMEDIATELY:  *FEVER GREATER THAN 100.5 F  *CHILLS WITH OR WITHOUT FEVER  NAUSEA AND VOMITING THAT IS NOT CONTROLLED WITH YOUR NAUSEA MEDICATION  *UNUSUAL SHORTNESS OF BREATH  *UNUSUAL BRUISING OR BLEEDING  TENDERNESS IN MOUTH AND THROAT WITH OR WITHOUT PRESENCE OF ULCERS  *URINARY PROBLEMS  *BOWEL PROBLEMS  UNUSUAL RASH Items with * indicate a potential emergency and should be followed up as soon as possible.  Feel free to call the clinic should you have any questions or concerns. The clinic phone number is [phone].  Please show the CHEMO ALERT CARD at check-in to the Emergency Department and triage nurse.   

## 2020-06-20 ENCOUNTER — Telehealth: Payer: Self-pay | Admitting: Oncology

## 2020-06-20 NOTE — Telephone Encounter (Signed)
Added MD appt between appts that were already on pt's schedule. Made no changes to patient's arrival time.

## 2020-06-21 ENCOUNTER — Other Ambulatory Visit: Payer: Self-pay | Admitting: *Deleted

## 2020-06-21 ENCOUNTER — Telehealth: Payer: Self-pay | Admitting: *Deleted

## 2020-06-21 DIAGNOSIS — C50412 Malignant neoplasm of upper-outer quadrant of left female breast: Secondary | ICD-10-CM

## 2020-06-21 NOTE — Telephone Encounter (Signed)
This RN received VM from the patient stating she has decided to not proceed with the last 4 cycles of taxol as discussed at the MD visit on 06/18/2020.  This RN canceled labs/flush and treatment per above.  Called pt back- obtained identified VM- message left per above as well as she is to keep MD appointment on 11/30 and request for appointment for Radiation - Dr Lisbeth Renshaw will be requested.

## 2020-06-25 ENCOUNTER — Inpatient Hospital Stay: Payer: Medicare HMO

## 2020-07-01 NOTE — Progress Notes (Signed)
[facility]  [phone]) [phone] [phone]     ID: JAQLYN GRUENHAGEN DOB: March 01, 1946  MR#: [id number]  VEL#:[id number]  Patient Care Team: Sueanne Margarita, DO as PCP - General (Internal Medicine) Rockwell Germany, RN as Oncology Nurse Navigator Mauro Kaufmann, RN as Oncology Nurse Navigator Nicodemus Denk, Virgie Dad, MD as Consulting Physician (Oncology) Alphonsa Overall, MD as Consulting Physician (General Surgery) Kyung Rudd, MD as Consulting Physician (Radiation Oncology) Wilford Corner, MD as Consulting Physician (Gastroenterology) Chauncey Cruel, MD OTHER MD:  CHIEF COMPLAINT: Estrogen receptor positive breast cancer  CURRENT TREATMENT: adjuvant radiation therapy pending   INTERVAL HISTORY: Brailee returns today for follow-up of her estrogen receptor positive breast cancer.  The main reason for today's visit was to make sure she was recovering after stopping the paclitaxel and that she was still comfortable with that decision  She began weekly paclitaxel on 04/30/2020.  At her last visit with me, with her eighth cycle of treatment, she expressed a desire not to proceed with the final 4 cycles of chemotherapy.  We discussed this extensively and she understands I cannot quantitate the effect on breast cancer risk reduction but my best guess would be a 1% increase in risk or so.  Initially she thought she wanted to continue but she subsequently called and let us know she opted to discontinue treatment following her 8th cycle on 06/18/2020.    Now for the 1st time after stopping treatment she reports some neuropathy.  This is mild, mostly in the tips of the finger pads and toes.  There is some numbness in the front of the feet bilaterally.  In light of that stopping the chemotherapy as was done turns out to be unavoidable in retrospect  She is now ready for radiation and is scheduled to meet back with Dr. Lisbeth Renshaw on 07/10/2020.  Because of a very low hemoglobin and  symptoms she received a unit of packed red cells on 05/14/2020.  Her hemoglobin is slowly recovering Lab Results  Component Value Date   HGB 9.5 (L) 07/02/2020   HGB 9.2 (L) 06/18/2020   HGB 9.0 (L) 06/11/2020   HGB 8.7 (L) 06/04/2020   HGB 8.8 (L) 05/28/2020   Lab Results  Component Value Date   CA2729 10.4 02/13/2020    REVIEW OF SYSTEMS: Ladesha is feeling much better.  She has more energy, and is doing more.  She is still does not have any her growth including eyebrows and eyelashes.  Her lower legs hurt.  She thinks that might be because she is walking more regularly, and it is possible that her shoes are not as comfortable as they should be.  Thanksgiving was wonderful with her children and multiple babies present.   COVID 19 VACCINATION STATUS: Status post Pfizer x2, with booster pending   HISTORY OF CURRENT ILLNESS: From the original intake note:  Maryela herself palpated a left breast mass, with associated dimpling, tenderness, and pinching pain. She underwent bilateral diagnostic mammography with tomography and left breast ultrasonography at [facility] on 11/09/2019 showing: breast density category C; 2.9 cm irregular mass in left breast at 2:30 corresponding to palpable abnormality; three enlarged left axillary lymph nodes.  Accordingly on 11/17/2019 she proceeded to biopsy of the left breast area in question. The pathology from this procedure ([id number]) showed: invasive mammary carcinoma, grade 2, e-cadherin positive. Prognostic indicators significant for: estrogen receptor, 95% positive and progesterone receptor, 95% positive, both with strong staining intensity. Proliferation marker Ki67 at  30%. HER2 negative by immunohistochemistry (1+).  The biopsied lymph node was positive for metastatic carcinoma.  The patient's subsequent history is as detailed below.   PAST MEDICAL HISTORY: Past Medical History:  Diagnosis Date  . Atrial septal defect    repaired age 16  .  Cancer ([place]) 01/2020   IDC left breast  . GERD (gastroesophageal reflux disease)   . Glaucoma   . Headache    migraines until age 74- 26, have them 3-4 x year  . History of blood transfusion    with ASD surgery at age 16  . History of kidney stones    passed stones  . Hypertension   . Vitamin D deficiency     PAST SURGICAL HISTORY: Past Surgical History:  Procedure Laterality Date  . ASD REPAIR     age 59  . BREAST LUMPECTOMY WITH RADIOACTIVE SEED AND AXILLARY LYMPH NODE DISSECTION Left 01/12/2020   Procedure: LEFT BREAST LUMPECTOMY WITH RADIOACTIVE SEED AND LEFT AXILLARY LYMPH NODE DISSECTION;  Surgeon: Alphonsa Overall, MD;  Location: [facility];  Service: General;  Laterality: Left;  PEC BLOCK  . COLONOSCOPY    . PORTACATH PLACEMENT Right 02/02/2020   Procedure: INSERTION PORT-A-CATH WITH ULTRASOUND GUIDANCE;  Surgeon: Alphonsa Overall, MD;  Location: [facility];  Service: General;  Laterality: Right;  . UPPER GI ENDOSCOPY      FAMILY HISTORY: Family History  Problem Relation Age of Onset  . Dementia Mother   . Heart attack Father   . Hypertension Father   . Breast cancer Sister 9  The patient's father died at age 68 from a myocardial infarction.  The patient's mother died at 26 with Alzheimer's disease.  The patient has 2 brothers, 2 sisters.  1 sister developed breast cancer at the age of 25.  The patient's daughter has a history of thyroid cancer.  She has not been genetically tested.  There is no other history of cancer in the family to the patient's knowledge.   GYNECOLOGIC HISTORY:  No LMP recorded (lmp unknown). Patient is postmenopausal. Menarche: 74 years old Age at first live birth: 74 years old [place] P 3 LMP age 74 Contraceptive approximately 2 years, with no complications HRT no  Hysterectomy?  No BSO?  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  Her husband [name] (goes  by [employer]") is also a retired Optometrist.  Son [name] 61 lives in [place] and works for a company that processes [employer] for [employer], but he completed a masters in Chief Executive Officer and is looking for a different job; daughter [name] lives in [place] and is a Oncologist; son [name] lives in [place] and runs a business that puts and tennis on cell towers.  The patient has 8 grandchildren.  She attends our [place] [employer].    ADVANCED DIRECTIVES: In the absence of any documents to the contrary the patient's husband is her healthcare power of attorney   HEALTH MAINTENANCE: Social History   Tobacco Use  . Smoking status: Never Smoker  . Smokeless tobacco: Never Used  Vaping Use  . Vaping Use: Never used  Substance Use Topics  . Alcohol use: No  . Drug use: No     Colonoscopy: "Less than 10 years ago"; [facility]  PAP: Remote  Bone density: Never   No Known Allergies  Current Outpatient Medications  Medication Sig Dispense Refill  . acetaminophen (  TYLENOL) 500 MG tablet Take 1 tablet (500 mg total) by mouth every 8 (eight) hours as needed. Take with aleve 220 md 60 tablet 0  . b complex vitamins tablet Take 1 tablet by mouth 3 (three) times a week.    . calcium gluconate 500 MG tablet Take 1 tablet (500 mg total) by mouth 3 (three) times daily.    . cholecalciferol (VITAMIN D) 1000 UNITS tablet Take 1,000 Units by mouth daily.    Marland Kitchen latanoprost (XALATAN) 0.005 % ophthalmic solution Place 1 drop into both eyes at bedtime.    . lidocaine-prilocaine (EMLA) cream Apply to affected area once 30 g 3  . loratadine (CLARITIN) 10 MG tablet Take 1 tablet (10 mg total) by mouth daily. 90 tablet 4  . losartan (COZAAR) 50 MG tablet Take 1 tablet (50 mg total) by mouth daily.    . Multiple Vitamins-Minerals (MULTIVITAMIN WITH MINERALS) tablet Take 1 tablet by mouth daily.    . naproxen sodium (ALEVE) 220 MG tablet Take 1 tablet (220 mg total) by mouth every 8 (eight) hours as  needed. Take with tylenol 500 mg. 60 tablet 3  . pantoprazole (PROTONIX) 40 MG tablet Take 40 mg by mouth every other day.      No current facility-administered medications for this visit.    OBJECTIVE: White woman who appears stated age  70:   07/02/20 0848  BP: (!) 145/63  Pulse: 92  Resp: 18  Temp: (!) 97.4 F (36.3 C)  SpO2: 97%   Wt Readings from Last 3 Encounters:  07/02/20 146 lb 12.8 oz (66.6 kg)  06/18/20 149 lb 6.4 oz (67.8 kg)  05/28/20 150 lb 11.2 oz (68.4 kg)   Body mass index is 29.65 kg/m.    ECOG FS:1 - Symptomatic but completely ambulatory  Sclerae unicteric, EOMs intact Wearing a mask No cervical or supraclavicular adenopathy Lungs no rales or rhonchi Heart regular rate and rhythm Abd soft, nontender, positive bowel sounds MSK no focal spinal tenderness, no upper extremity lymphedema Neuro: nonfocal, well oriented, appropriate affect Breasts: Deferred   LAB RESULTS:  CMP     Component Value Date/Time   NA 139 07/02/2020 0840   K 3.7 07/02/2020 0840   CL 105 07/02/2020 0840   CO2 25 07/02/2020 0840   GLUCOSE 117 (H) 07/02/2020 0840   BUN 19 07/02/2020 0840   CREATININE 0.87 07/02/2020 0840   CREATININE 1.06 (H) 11/29/2019 0845   CALCIUM 9.6 07/02/2020 0840   PROT 6.6 07/02/2020 0840   ALBUMIN 3.9 07/02/2020 0840   AST 26 07/02/2020 0840   AST 23 11/29/2019 0845   ALT 15 07/02/2020 0840   ALT 17 11/29/2019 0845   ALKPHOS 40 07/02/2020 0840   BILITOT 0.5 07/02/2020 0840   BILITOT 0.6 11/29/2019 0845   GFRNONAA >60 07/02/2020 0840   GFRNONAA 52 (L) 11/29/2019 0845   GFRAA 58 (L) 04/30/2020 1020   GFRAA >60 11/29/2019 0845    No results found for: TOTALPROTELP, ALBUMINELP, A1GS, A2GS, BETS, BETA2SER, GAMS, MSPIKE, SPEI  Lab Results  Component Value Date   WBC 9.1 07/02/2020   NEUTROABS 7.1 07/02/2020   HGB 9.5 (L) 07/02/2020   HCT 28.8 (L) 07/02/2020   MCV 95.0 07/02/2020   PLT 298 07/02/2020    No results found for:  LABCA2  No components found for: IRSWNI627  No results for input(s): INR in the last 168 hours.  No results found for: LABCA2  No results found for: OJJ009  No results  found for: MHD622  No results found for: WLN989  Lab Results  Component Value Date   CA2729 10.4 02/13/2020    No components found for: HGQUANT  No results found for: CEA1 / No results found for: CEA1   No results found for: AFPTUMOR  No results found for: CHROMOGRNA  No results found for: KPAFRELGTCHN, LAMBDASER, KAPLAMBRATIO (kappa/lambda light chains)  No results found for: HGBA, HGBA2QUANT, HGBFQUANT, HGBSQUAN (Hemoglobinopathy evaluation)   Lab Results  Component Value Date   LDH 230 (H) 06/28/2019    No results found for: IRON, TIBC, IRONPCTSAT (Iron and TIBC)  Lab Results  Component Value Date   FERRITIN 626 (H) 07/03/2019    Urinalysis    Component Value Date/Time   COLORURINE YELLOW 06/11/2013 2007   APPEARANCEUR CLEAR 06/11/2013 2007   LABSPEC 1.017 06/11/2013 2007   PHURINE 6.5 06/11/2013 2007   GLUCOSEU NEGATIVE 06/11/2013 2007   HGBUR NEGATIVE 06/11/2013 2007   [place] NEGATIVE 06/11/2013 2007   [place] NEGATIVE 06/11/2013 2007   PROTEINUR NEGATIVE 06/11/2013 2007   UROBILINOGEN 0.2 06/11/2013 2007   NITRITE NEGATIVE 06/11/2013 2007   LEUKOCYTESUR NEGATIVE 06/11/2013 2007    STUDIES: No results found.   ELIGIBLE FOR AVAILABLE RESEARCH PROTOCOL: AET  ASSESSMENT: 74 y.o. [place] woman status post left breast upper outer quadrant biopsy 11/17/2019 for a clinical T2 N1, stage IIA invasive ductal carcinoma, grade 2, estrogen and progesterone receptor positive, HER-2 not amplified, with an MIB-1 of 30%.  (1) status post left lumpectomy and axillary lymph node dissection 01/12/2020 for a pT2 pN2, stage IIIA invasive ductal carcinoma, grade 3, with negative margins  (a) a total of 14 axillary lymph nodes removed, 8 positive, with extracapsular extension  (2)  adjuvant chemotherapy consisting of doxorubicin and cyclophosphamide in dose dense fashion x4 started 02/13/2020, completed 04/10/2020, followed by weekly paclitaxel x12 started 04/30/2020  (a) echocardiogram 02/08/2020 showed an ejection fraction in the 65/70%  (b) final 2 cycles of doxorubicin/cyclophosphamide given 21 days apart and 18% dose reduced  (c) final 4 doses of paclitaxel omitted secondary to fatigue and neuropathy  (3) adjuvant radiation pending  (4) antiestrogens to start at the completion of local treatment   PLAN: Kenetra either has developed or finally admits to having some peripheral neuropathy.  This is still grade 1.  Certainly it was a correct move to stop the chemotherapy at the time we did  Her hemoglobin is slowly improving.  I expect she will continue to have more energy and become more active in the next few weeks although of course radiation also can cause fatigue.  She is already set up to see Dr. Lisbeth Renshaw.  I suspect she will be treated well into January.  I am going to see her again in about 6 weeks and we will do some lab work with a flush at the same time.  She may want to have her port removed after that  Total encounter time 25 minutes.Sarajane Jews C. Chaia Ikard, MD 07/02/2020 7:13 PM Medical Oncology and Hematology [facility] [place], [place] [address] Tel. [phone]    Fax. [phone]   I, Wilburn Mylar, am acting as scribe for Dr. Virgie Dad. Lannie Heaps.  I, Lurline Del MD, have reviewed the above documentation for accuracy and completeness, and I agree with the above.   *Total Encounter Time as defined by the Centers for Medicare and Medicaid Services includes, in addition to the face-to-face time of a patient visit (documented in  the note above) non-face-to-face time: obtaining and reviewing outside history, ordering and reviewing medications, tests or procedures, care coordination (communications with other health  care professionals or caregivers) and documentation in the medical record.

## 2020-07-02 ENCOUNTER — Inpatient Hospital Stay: Payer: Medicare HMO

## 2020-07-02 ENCOUNTER — Telehealth: Payer: Self-pay | Admitting: Oncology

## 2020-07-02 ENCOUNTER — Inpatient Hospital Stay: Payer: Medicare HMO | Admitting: Oncology

## 2020-07-02 ENCOUNTER — Ambulatory Visit: Payer: Medicare HMO

## 2020-07-02 ENCOUNTER — Other Ambulatory Visit: Payer: Self-pay

## 2020-07-02 VITALS — BP 145/63 | HR 92 | Temp 97.4°F | Resp 18 | Ht 59.0 in | Wt 146.8 lb

## 2020-07-02 DIAGNOSIS — R5383 Other fatigue: Secondary | ICD-10-CM | POA: Diagnosis not present

## 2020-07-02 DIAGNOSIS — Z95828 Presence of other vascular implants and grafts: Secondary | ICD-10-CM

## 2020-07-02 DIAGNOSIS — Z170 Estrogen receptor positive status [ER+]: Secondary | ICD-10-CM

## 2020-07-02 DIAGNOSIS — C50412 Malignant neoplasm of upper-outer quadrant of left female breast: Secondary | ICD-10-CM | POA: Diagnosis not present

## 2020-07-02 DIAGNOSIS — C773 Secondary and unspecified malignant neoplasm of axilla and upper limb lymph nodes: Secondary | ICD-10-CM | POA: Diagnosis not present

## 2020-07-02 DIAGNOSIS — Z23 Encounter for immunization: Secondary | ICD-10-CM | POA: Diagnosis not present

## 2020-07-02 DIAGNOSIS — I10 Essential (primary) hypertension: Secondary | ICD-10-CM | POA: Diagnosis not present

## 2020-07-02 DIAGNOSIS — K219 Gastro-esophageal reflux disease without esophagitis: Secondary | ICD-10-CM | POA: Diagnosis not present

## 2020-07-02 DIAGNOSIS — Z5111 Encounter for antineoplastic chemotherapy: Secondary | ICD-10-CM | POA: Diagnosis not present

## 2020-07-02 DIAGNOSIS — R590 Localized enlarged lymph nodes: Secondary | ICD-10-CM | POA: Diagnosis not present

## 2020-07-02 LAB — COMPREHENSIVE METABOLIC PANEL
ALT: 15 U/L (ref 0–44)
AST: 26 U/L (ref 15–41)
Albumin: 3.9 g/dL (ref 3.5–5.0)
Alkaline Phosphatase: 40 U/L (ref 38–126)
Anion gap: 9 (ref 5–15)
BUN: 19 mg/dL (ref 8–23)
CO2: 25 mmol/L (ref 22–32)
Calcium: 9.6 mg/dL (ref 8.9–10.3)
Chloride: 105 mmol/L (ref 98–111)
Creatinine, Ser: 0.87 mg/dL (ref 0.44–1.00)
GFR, Estimated: >60 mL/min (ref >60)
Glucose, Bld: 117 mg/dL — ABNORMAL HIGH (ref 70–99)
Potassium: 3.7 mmol/L (ref 3.5–5.1)
Sodium: 139 mmol/L (ref 135–145)
Total Bilirubin: 0.5 mg/dL (ref 0.3–1.2)
Total Protein: 6.6 g/dL (ref 6.5–8.1)

## 2020-07-02 LAB — CBC WITH DIFFERENTIAL/PLATELET
Abs Immature Granulocytes: 0.03 10*3/uL (ref 0.00–0.07)
Basophils Absolute: 0.1 10*3/uL (ref 0.0–0.1)
Basophils Relative: 1 %
Eosinophils Absolute: 0.1 10*3/uL (ref 0.0–0.5)
Eosinophils Relative: 1 %
HCT: 28.8 % — ABNORMAL LOW (ref 36.0–46.0)
Hemoglobin: 9.5 g/dL — ABNORMAL LOW (ref 12.0–15.0)
Immature Granulocytes: 0 %
Lymphocytes Relative: 8 %
Lymphs Abs: 0.8 10*3/uL (ref 0.7–4.0)
MCH: 31.4 pg (ref 26.0–34.0)
MCHC: 33 g/dL (ref 30.0–36.0)
MCV: 95 fL (ref 80.0–100.0)
Monocytes Absolute: 1.1 10*3/uL — ABNORMAL HIGH (ref 0.1–1.0)
Monocytes Relative: 12 %
Neutro Abs: 7.1 10*3/uL (ref 1.7–7.7)
Neutrophils Relative %: 78 %
Platelets: 298 10*3/uL (ref 150–400)
RBC: 3.03 MIL/uL — ABNORMAL LOW (ref 3.87–5.11)
RDW: 14.2 % (ref 11.5–15.5)
WBC: 9.1 10*3/uL (ref 4.0–10.5)
nRBC: 0 % (ref 0.0–0.2)

## 2020-07-02 MED ORDER — HEPARIN SOD (PORK) LOCK FLUSH 100 UNIT/ML IV SOLN
500.0000 [IU] | Freq: Once | INTRAVENOUS | Status: AC
  Administered 2020-07-02: 500 [IU]
  Filled 2020-07-02: qty 5

## 2020-07-02 MED ORDER — LORATADINE 10 MG PO TABS: 10.0000 mg | ORAL_TABLET | Freq: Every day | ORAL | 4 refills | Status: DC

## 2020-07-02 MED ORDER — SODIUM CHLORIDE 0.9% FLUSH
10.0000 mL | Freq: Once | INTRAVENOUS | Status: AC
  Administered 2020-07-02: 10 mL
  Filled 2020-07-02: qty 10

## 2020-07-02 NOTE — Telephone Encounter (Signed)
Scheduled appts per 11/30 los. Gave pt a print out of AVS.  

## 2020-07-03 DIAGNOSIS — U071 COVID-19: Secondary | ICD-10-CM | POA: Diagnosis not present

## 2020-07-04 ENCOUNTER — Encounter: Payer: Self-pay | Admitting: *Deleted

## 2020-07-09 ENCOUNTER — Ambulatory Visit: Payer: Medicare HMO

## 2020-07-09 ENCOUNTER — Other Ambulatory Visit: Payer: Medicare HMO

## 2020-07-10 ENCOUNTER — Encounter: Payer: Self-pay | Admitting: Radiation Oncology

## 2020-07-10 ENCOUNTER — Ambulatory Visit
Admission: RE | Admit: 2020-07-10 | Discharge: 2020-07-10 | Disposition: A | Discharge status: Home / Self Care | Payer: Medicare HMO | Source: Ambulatory Visit | Attending: Radiation Oncology | Admitting: Radiation Oncology

## 2020-07-10 ENCOUNTER — Other Ambulatory Visit: Payer: Self-pay

## 2020-07-10 VITALS — BP 163/63 | HR 93 | Temp 98.2°F | Resp 18 | Ht 59.0 in | Wt 144.8 lb

## 2020-07-10 DIAGNOSIS — E559 Vitamin D deficiency, unspecified: Secondary | ICD-10-CM | POA: Diagnosis not present

## 2020-07-10 DIAGNOSIS — H409 Unspecified glaucoma: Secondary | ICD-10-CM | POA: Diagnosis not present

## 2020-07-10 DIAGNOSIS — Q211 Atrial septal defect: Secondary | ICD-10-CM | POA: Diagnosis not present

## 2020-07-10 DIAGNOSIS — Z803 Family history of malignant neoplasm of breast: Secondary | ICD-10-CM | POA: Diagnosis not present

## 2020-07-10 DIAGNOSIS — I10 Essential (primary) hypertension: Secondary | ICD-10-CM | POA: Insufficient documentation

## 2020-07-10 DIAGNOSIS — K219 Gastro-esophageal reflux disease without esophagitis: Secondary | ICD-10-CM | POA: Diagnosis not present

## 2020-07-10 DIAGNOSIS — Z79899 Other long term (current) drug therapy: Secondary | ICD-10-CM | POA: Diagnosis not present

## 2020-07-10 DIAGNOSIS — Z87442 Personal history of urinary calculi: Secondary | ICD-10-CM | POA: Diagnosis not present

## 2020-07-10 DIAGNOSIS — Z170 Estrogen receptor positive status [ER+]: Secondary | ICD-10-CM | POA: Diagnosis not present

## 2020-07-10 DIAGNOSIS — C50412 Malignant neoplasm of upper-outer quadrant of left female breast: Secondary | ICD-10-CM | POA: Diagnosis not present

## 2020-07-10 NOTE — Progress Notes (Signed)
Radiation Oncology         [phone] ________________________________  Name: Cynthia Vasquez        MRN: [id number]  Date of Service: 07/10/2020 DOB: 01-Aug-1946  LA:GTXMIW, Cynthia Vasquez  Magrinat, Virgie Dad, MD     REFERRING PHYSICIAN: Magrinat, Virgie Dad, MD   DIAGNOSIS: The encounter diagnosis was Malignant neoplasm of upper-outer quadrant of left breast in female, estrogen receptor positive ([place]).   HISTORY OF PRESENT ILLNESS: Cynthia Vasquez is a 74 y.o. female originally seen in the multidisciplinary breast clinic for a new diagnosis of left breast cancer. The patient was noted to have a palpable mass in the left breast and she proceeded with diagnostic work up. Diagnostic imaging revealed 2 masses at 2:30. They measured 2.9 x 1.9 x 1.3 cm, and 1 x .4 x.3 cm and three enlarged axillary nodes. 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 Her tumor was ER/PR positive, HER2 negative with a Ki 67 of 30%. She underwent left lumpectomy and ALND on 01/12/2020 which revealed a grade 3 invasive ductal carcinoma measuring 2.8 cm. Her resection margins were negative but the closest were the anterior margin at 1 mm and superior margins at 3 mm. She had additional medial margin excision that was negative, and of the 14 sampled lymph nodes, 8 of which were positive for malignancy. She subsequently underwent adjuvant chemotherapy between July 13 and June 18, 2020. She is seen today to discuss moving forward with adjuvant radiotherapy to the left breast and regional lymph nodes.    PREVIOUS RADIATION THERAPY: No   PAST MEDICAL HISTORY:  Past Medical History:  Diagnosis Date   Atrial septal defect    repaired age 74   Cancer ([place]) 01/2020   IDC left breast   GERD (gastroesophageal reflux disease)    Glaucoma    Headache    migraines until age 30- 89, have them 3-4 x year   History of blood transfusion     with ASD surgery at age 89   History of kidney stones    passed stones   Hypertension    Vitamin D deficiency        PAST SURGICAL HISTORY: Past Surgical History:  Procedure Laterality Date   ASD REPAIR     age 28   BREAST LUMPECTOMY WITH RADIOACTIVE SEED AND AXILLARY LYMPH NODE DISSECTION Left 01/12/2020   Procedure: LEFT BREAST LUMPECTOMY WITH RADIOACTIVE SEED AND LEFT AXILLARY LYMPH NODE DISSECTION;  Surgeon: Alphonsa Overall, MD;  Location: [facility];  Service: General;  Laterality: Left;  PEC BLOCK   COLONOSCOPY     PORTACATH PLACEMENT Right 02/02/2020   Procedure: INSERTION PORT-A-CATH WITH ULTRASOUND GUIDANCE;  Surgeon: Alphonsa Overall, MD;  Location: [facility];  Service: General;  Laterality: Right;   UPPER GI ENDOSCOPY       FAMILY HISTORY:  Family History  Problem Relation Age of Onset   Dementia Mother    Heart attack Father    Hypertension Father    Breast cancer Sister 50     SOCIAL HISTORY:  reports that she has never smoked. She has never used smokeless tobacco. She reports that she does not drink alcohol and does not use drugs. The patient is married and lives in [place]. She's a retired Optometrist.   ALLERGIES: Patient has no known allergies.   MEDICATIONS:  Current Outpatient Medications  Medication Sig Dispense Refill  acetaminophen (TYLENOL) 500 MG tablet Take 1 tablet (500 mg total) by mouth every 8 (eight) hours as needed. Take with aleve 220 md 60 tablet 0   b complex vitamins tablet Take 1 tablet by mouth 3 (three) times a week.     calcium gluconate 500 MG tablet Take 1 tablet (500 mg total) by mouth 3 (three) times daily.     cholecalciferol (VITAMIN D) 1000 UNITS tablet Take 1,000 Units by mouth daily.     latanoprost (XALATAN) 0.005 % ophthalmic solution Place 1 drop into both eyes at bedtime.     lidocaine-prilocaine (EMLA) cream Apply to affected area once 30 g 3   loratadine (CLARITIN) 10 MG tablet Take 1  tablet (10 mg total) by mouth daily. 90 tablet 4   losartan (COZAAR) 50 MG tablet Take 1 tablet (50 mg total) by mouth daily.     Multiple Vitamins-Minerals (MULTIVITAMIN WITH MINERALS) tablet Take 1 tablet by mouth daily.     naproxen sodium (ALEVE) 220 MG tablet Take 1 tablet (220 mg total) by mouth every 8 (eight) hours as needed. Take with tylenol 500 mg. 60 tablet 3   pantoprazole (PROTONIX) 40 MG tablet Take 40 mg by mouth every other day.      No current facility-administered medications for this encounter.     REVIEW OF SYSTEMS: On review of systems, the patient reports that she is doing well overall. She reports fullness in her left axilla but reports good range of motion. She had trouble with neuropathy in her fingertips and with altered taste during chemotherapy. She's happy to say that her symptoms from chemo are improving. No other complaints are noted.    PHYSICAL EXAM:  Wt Readings from Last 3 Encounters:  07/10/20 144 lb 12.8 oz (65.7 kg)  07/02/20 146 lb 12.8 oz (66.6 kg)  06/18/20 149 lb 6.4 oz (67.8 kg)   Temp Readings from Last 3 Encounters:  07/10/20 98.2 F (36.8 C)  07/02/20 (!) 97.4 F (36.3 C) (Tympanic)  06/18/20 (!) 97 F (36.1 C) (Tympanic)   BP Readings from Last 3 Encounters:  07/10/20 (!) 163/63  07/02/20 (!) 145/63  06/18/20 134/65   Pulse Readings from Last 3 Encounters:  07/10/20 93  07/02/20 92  06/18/20 97    In general this is a well appearing caucasian female in no acute distress. She's alert and oriented x4 and appropriate throughout the examination. Cardiopulmonary assessment is negative for acute distress and she exhibits normal effort. Bilateral breast exam is deferred. But she does have palpable fullness in her left axilla which I believe is cording. She is able to show me though that she does not have limited range of motion.   ECOG = 1  0 - Asymptomatic (Fully active, able to carry on all predisease activities without  restriction)  1 - Symptomatic but completely ambulatory (Restricted in physically strenuous activity but ambulatory and able to carry out work of a light or sedentary nature. For example, light housework, office work)  2 - Symptomatic, <50% in bed during the day (Ambulatory and capable of all self care but unable to carry out any work activities. Up and about more than 50% of waking hours)  3 - Symptomatic, >50% in bed, but not bedbound (Capable of only limited self-care, confined to bed or chair 50% or more of waking hours)  4 - Bedbound (Completely disabled. Cannot carry on any self-care. Totally confined to bed or chair)  5 - Death   Eustace Pen  MM, Creech RH, Tormey DC, et al. [phone]). "Toxicity and response criteria of the [facility]". [place] Oncol. 5 (6): 649-55    LABORATORY DATA:  Lab Results  Component Value Date   WBC 9.1 07/02/2020   HGB 9.5 (L) 07/02/2020   HCT 28.8 (L) 07/02/2020   MCV 95.0 07/02/2020   PLT 298 07/02/2020   Lab Results  Component Value Date   NA 139 07/02/2020   K 3.7 07/02/2020   CL 105 07/02/2020   CO2 25 07/02/2020   Lab Results  Component Value Date   ALT 15 07/02/2020   AST 26 07/02/2020   ALKPHOS 40 07/02/2020   BILITOT 0.5 07/02/2020      RADIOGRAPHY: No results found.     IMPRESSION/PLAN: 1. Stage IIB, pT2N2aM0 grade 3, ER/PR positive invasive ductal carcinoma of the left breast. Dr. Lisbeth Renshaw discusses the final pathology findings and reviews the nature of left breast disease. He reviews that based on her surgical findings and nodal involvement, she would benefit from external radiotherapy to the breast and regional nodes followed by antiestrogen therapy to reduce risks of recurrent disease as well. We discussed the risks, benefits, short, and long term effects of radiotherapy, and the patient is interested in proceeding with curative intent. Dr. Lisbeth Renshaw discusses the delivery and logistics of radiotherapy and  anticipates a course of 6 1/2 weeks of radiotherapy to the left breast and regional nodes with deep inspiration breath hold technique. Written consent is obtained and placed in the chart, a copy was provided to the patient. She will simulate tomorrow morning.  2. Palpable cording of the left axilla. I will set her up to meet with PT for evaluation of this.  In a visit lasting 45  minutes, greater than 50% of the time was spent face to face reviewing her case, as well as in preparation of, discussing, and coordinating the patient's care.  The above documentation reflects my direct findings during this shared patient visit. Please see the separate note by Dr. Lisbeth Renshaw on this date for the remainder of the patient's plan of care.    Carola Rhine, PAC

## 2020-07-11 ENCOUNTER — Ambulatory Visit
Admission: RE | Admit: 2020-07-11 | Discharge: 2020-07-11 | Disposition: A | Discharge status: Home / Self Care | Payer: Medicare HMO | Source: Ambulatory Visit | Attending: Radiation Oncology | Admitting: Radiation Oncology

## 2020-07-11 DIAGNOSIS — Z510 Encounter for antineoplastic radiation therapy: Secondary | ICD-10-CM | POA: Insufficient documentation

## 2020-07-11 DIAGNOSIS — Z170 Estrogen receptor positive status [ER+]: Secondary | ICD-10-CM | POA: Insufficient documentation

## 2020-07-11 DIAGNOSIS — C50412 Malignant neoplasm of upper-outer quadrant of left female breast: Secondary | ICD-10-CM | POA: Insufficient documentation

## 2020-07-16 ENCOUNTER — Encounter: Payer: Self-pay | Admitting: *Deleted

## 2020-07-16 ENCOUNTER — Ambulatory Visit: Payer: Medicare HMO

## 2020-07-16 ENCOUNTER — Other Ambulatory Visit: Payer: Medicare HMO

## 2020-07-17 DIAGNOSIS — Z170 Estrogen receptor positive status [ER+]: Secondary | ICD-10-CM | POA: Diagnosis not present

## 2020-07-17 DIAGNOSIS — Z510 Encounter for antineoplastic radiation therapy: Secondary | ICD-10-CM | POA: Diagnosis not present

## 2020-07-17 DIAGNOSIS — C50412 Malignant neoplasm of upper-outer quadrant of left female breast: Secondary | ICD-10-CM | POA: Diagnosis not present

## 2020-07-18 ENCOUNTER — Ambulatory Visit
Admission: RE | Admit: 2020-07-18 | Discharge: 2020-07-18 | Disposition: A | Discharge status: Home / Self Care | Payer: Medicare HMO | Source: Ambulatory Visit | Attending: Radiation Oncology | Admitting: Radiation Oncology

## 2020-07-18 ENCOUNTER — Other Ambulatory Visit: Payer: Self-pay

## 2020-07-18 DIAGNOSIS — C50412 Malignant neoplasm of upper-outer quadrant of left female breast: Secondary | ICD-10-CM | POA: Diagnosis not present

## 2020-07-18 DIAGNOSIS — Z510 Encounter for antineoplastic radiation therapy: Secondary | ICD-10-CM | POA: Diagnosis not present

## 2020-07-18 DIAGNOSIS — Z170 Estrogen receptor positive status [ER+]: Secondary | ICD-10-CM | POA: Diagnosis not present

## 2020-07-19 ENCOUNTER — Other Ambulatory Visit: Payer: Self-pay

## 2020-07-19 ENCOUNTER — Ambulatory Visit
Admission: RE | Admit: 2020-07-19 | Discharge: 2020-07-19 | Disposition: A | Discharge status: Home / Self Care | Payer: Medicare HMO | Source: Ambulatory Visit | Attending: Radiation Oncology | Admitting: Radiation Oncology

## 2020-07-19 DIAGNOSIS — Z170 Estrogen receptor positive status [ER+]: Secondary | ICD-10-CM | POA: Diagnosis not present

## 2020-07-19 DIAGNOSIS — Z510 Encounter for antineoplastic radiation therapy: Secondary | ICD-10-CM | POA: Diagnosis not present

## 2020-07-19 DIAGNOSIS — C50412 Malignant neoplasm of upper-outer quadrant of left female breast: Secondary | ICD-10-CM | POA: Diagnosis not present

## 2020-07-19 MED ORDER — ALRA NON-METALLIC DEODORANT (RAD-ONC)
1.0000 "application " | Freq: Once | TOPICAL | Status: AC
  Administered 2020-07-19: 1 via TOPICAL

## 2020-07-19 MED ORDER — RADIAPLEXRX EX GEL: Freq: Once | CUTANEOUS | Status: AC

## 2020-07-19 NOTE — Progress Notes (Signed)
Pt here for patient teaching.  Pt given Radiation and You booklet, skin care instructions, Alra deodorant and Radiaplex gel.  Reviewed areas of pertinence such as fatigue, hair loss, skin changes, breast tenderness and breast swelling . Pt able to give teach back of to pat skin and use unscented/gentle soap,apply Radiaplex bid, avoid applying anything to skin within 4 hours of treatment, avoid wearing an under wire bra and to use an electric razor if they must shave. Pt verbalizes understanding of information given and will contact nursing with any questions or concerns.     LaToya M. Silva RN, BSN      

## 2020-07-22 ENCOUNTER — Ambulatory Visit
Admission: RE | Admit: 2020-07-22 | Discharge: 2020-07-22 | Disposition: A | Discharge status: Home / Self Care | Payer: Medicare HMO | Source: Ambulatory Visit | Attending: Radiation Oncology | Admitting: Radiation Oncology

## 2020-07-22 DIAGNOSIS — Z510 Encounter for antineoplastic radiation therapy: Secondary | ICD-10-CM | POA: Diagnosis not present

## 2020-07-22 DIAGNOSIS — Z170 Estrogen receptor positive status [ER+]: Secondary | ICD-10-CM | POA: Diagnosis not present

## 2020-07-22 DIAGNOSIS — C50412 Malignant neoplasm of upper-outer quadrant of left female breast: Secondary | ICD-10-CM | POA: Diagnosis not present

## 2020-07-23 ENCOUNTER — Other Ambulatory Visit: Payer: Medicare HMO

## 2020-07-23 ENCOUNTER — Ambulatory Visit
Admission: RE | Admit: 2020-07-23 | Discharge: 2020-07-23 | Disposition: A | Discharge status: Home / Self Care | Payer: Medicare HMO | Source: Ambulatory Visit | Attending: Radiation Oncology | Admitting: Radiation Oncology

## 2020-07-23 ENCOUNTER — Ambulatory Visit: Payer: Medicare HMO

## 2020-07-23 DIAGNOSIS — C50412 Malignant neoplasm of upper-outer quadrant of left female breast: Secondary | ICD-10-CM | POA: Diagnosis not present

## 2020-07-23 DIAGNOSIS — Z510 Encounter for antineoplastic radiation therapy: Secondary | ICD-10-CM | POA: Diagnosis not present

## 2020-07-23 DIAGNOSIS — Z170 Estrogen receptor positive status [ER+]: Secondary | ICD-10-CM | POA: Diagnosis not present

## 2020-07-24 ENCOUNTER — Other Ambulatory Visit: Payer: Self-pay

## 2020-07-24 ENCOUNTER — Ambulatory Visit
Admission: RE | Admit: 2020-07-24 | Discharge: 2020-07-24 | Disposition: A | Discharge status: Home / Self Care | Payer: Medicare HMO | Source: Ambulatory Visit | Attending: Radiation Oncology | Admitting: Radiation Oncology

## 2020-07-24 DIAGNOSIS — Z170 Estrogen receptor positive status [ER+]: Secondary | ICD-10-CM | POA: Diagnosis not present

## 2020-07-24 DIAGNOSIS — Z510 Encounter for antineoplastic radiation therapy: Secondary | ICD-10-CM | POA: Diagnosis not present

## 2020-07-24 DIAGNOSIS — C50412 Malignant neoplasm of upper-outer quadrant of left female breast: Secondary | ICD-10-CM | POA: Diagnosis not present

## 2020-07-25 ENCOUNTER — Ambulatory Visit
Admission: RE | Admit: 2020-07-25 | Discharge: 2020-07-25 | Disposition: A | Discharge status: Home / Self Care | Payer: Medicare HMO | Source: Ambulatory Visit | Attending: Radiation Oncology | Admitting: Radiation Oncology

## 2020-07-25 DIAGNOSIS — Z510 Encounter for antineoplastic radiation therapy: Secondary | ICD-10-CM | POA: Diagnosis not present

## 2020-07-25 DIAGNOSIS — Z170 Estrogen receptor positive status [ER+]: Secondary | ICD-10-CM | POA: Diagnosis not present

## 2020-07-25 DIAGNOSIS — C50412 Malignant neoplasm of upper-outer quadrant of left female breast: Secondary | ICD-10-CM | POA: Diagnosis not present

## 2020-07-29 ENCOUNTER — Ambulatory Visit
Admission: RE | Admit: 2020-07-29 | Discharge: 2020-07-29 | Disposition: A | Discharge status: Home / Self Care | Payer: Medicare HMO | Source: Ambulatory Visit | Attending: Radiation Oncology | Admitting: Radiation Oncology

## 2020-07-29 DIAGNOSIS — Z510 Encounter for antineoplastic radiation therapy: Secondary | ICD-10-CM | POA: Diagnosis not present

## 2020-07-29 DIAGNOSIS — Z170 Estrogen receptor positive status [ER+]: Secondary | ICD-10-CM | POA: Diagnosis not present

## 2020-07-29 DIAGNOSIS — C50412 Malignant neoplasm of upper-outer quadrant of left female breast: Secondary | ICD-10-CM | POA: Diagnosis not present

## 2020-07-30 ENCOUNTER — Ambulatory Visit
Admission: RE | Admit: 2020-07-30 | Discharge: 2020-07-30 | Disposition: A | Discharge status: Home / Self Care | Payer: Medicare HMO | Source: Ambulatory Visit | Attending: Radiation Oncology | Admitting: Radiation Oncology

## 2020-07-30 ENCOUNTER — Ambulatory Visit: Payer: Medicare HMO | Admitting: Rehabilitation

## 2020-07-30 DIAGNOSIS — Z510 Encounter for antineoplastic radiation therapy: Secondary | ICD-10-CM | POA: Diagnosis not present

## 2020-07-30 DIAGNOSIS — C50412 Malignant neoplasm of upper-outer quadrant of left female breast: Secondary | ICD-10-CM | POA: Diagnosis not present

## 2020-07-30 DIAGNOSIS — Z170 Estrogen receptor positive status [ER+]: Secondary | ICD-10-CM | POA: Diagnosis not present

## 2020-07-31 ENCOUNTER — Ambulatory Visit: Payer: Medicare HMO

## 2020-08-01 ENCOUNTER — Ambulatory Visit
Admission: RE | Admit: 2020-08-01 | Discharge: 2020-08-01 | Disposition: A | Discharge status: Home / Self Care | Payer: Medicare HMO | Source: Ambulatory Visit | Attending: Radiation Oncology | Admitting: Radiation Oncology

## 2020-08-01 DIAGNOSIS — C50412 Malignant neoplasm of upper-outer quadrant of left female breast: Secondary | ICD-10-CM | POA: Diagnosis not present

## 2020-08-01 DIAGNOSIS — Z170 Estrogen receptor positive status [ER+]: Secondary | ICD-10-CM | POA: Diagnosis not present

## 2020-08-01 DIAGNOSIS — Z510 Encounter for antineoplastic radiation therapy: Secondary | ICD-10-CM | POA: Diagnosis not present

## 2020-08-03 DIAGNOSIS — U071 COVID-19: Secondary | ICD-10-CM | POA: Diagnosis not present

## 2020-08-05 ENCOUNTER — Encounter: Payer: Self-pay | Admitting: *Deleted

## 2020-08-05 ENCOUNTER — Telehealth: Payer: Self-pay | Admitting: *Deleted

## 2020-08-05 ENCOUNTER — Ambulatory Visit
Admission: RE | Admit: 2020-08-05 | Discharge: 2020-08-05 | Disposition: A | Discharge status: Home / Self Care | Payer: Medicare HMO | Source: Ambulatory Visit | Attending: Radiation Oncology | Admitting: Radiation Oncology

## 2020-08-05 ENCOUNTER — Other Ambulatory Visit: Payer: Self-pay

## 2020-08-05 DIAGNOSIS — C50412 Malignant neoplasm of upper-outer quadrant of left female breast: Secondary | ICD-10-CM | POA: Diagnosis not present

## 2020-08-05 DIAGNOSIS — Z170 Estrogen receptor positive status [ER+]: Secondary | ICD-10-CM | POA: Diagnosis not present

## 2020-08-05 NOTE — Telephone Encounter (Signed)
This RN received VM from the patient stating she has received a second denial from her insurance for the retacrit she received 1 time on 05/21/2020.  Pt states she is unable to afford the cost of the injection and " I was told it was approved by my insurance before they gave it to me"  Note pt called 05/28/2020 stating the retacrit was denied and message was forwarded to the PA department by covering nurse.  Noted by this RN - Approval for retacrit still pending per 05/21/2020.  Medication was released on 10/19 at 904 by nurse and verified by pharmacist at 905.  Reason for retacrit is noted in MD dictation for 05/21/2020 for 1 time use to assist in production of red cells impaired due to chemotherapy.  Pt had transfusion on 05/15/2020 with minimal benefit.  Noted pt had steady increase in heme post receiving retacrit which allowed her to continue on  treatment plan without further interruption.  This note will be sent to [facility], Pryor Ochoa and MD for review for further and follow up.  This RN informed pt of above plan

## 2020-08-06 ENCOUNTER — Ambulatory Visit
Admission: RE | Admit: 2020-08-06 | Discharge: 2020-08-06 | Disposition: A | Discharge status: Home / Self Care | Payer: Medicare HMO | Source: Ambulatory Visit | Attending: Radiation Oncology | Admitting: Radiation Oncology

## 2020-08-06 ENCOUNTER — Other Ambulatory Visit: Payer: Self-pay

## 2020-08-06 ENCOUNTER — Encounter: Payer: Self-pay | Admitting: Radiation Oncology

## 2020-08-06 DIAGNOSIS — Z170 Estrogen receptor positive status [ER+]: Secondary | ICD-10-CM | POA: Diagnosis not present

## 2020-08-06 DIAGNOSIS — C50412 Malignant neoplasm of upper-outer quadrant of left female breast: Secondary | ICD-10-CM | POA: Diagnosis not present

## 2020-08-07 ENCOUNTER — Ambulatory Visit
Admission: RE | Admit: 2020-08-07 | Discharge: 2020-08-07 | Disposition: A | Discharge status: Home / Self Care | Payer: Medicare HMO | Source: Ambulatory Visit | Attending: Radiation Oncology | Admitting: Radiation Oncology

## 2020-08-07 ENCOUNTER — Other Ambulatory Visit: Payer: Self-pay

## 2020-08-07 DIAGNOSIS — Z170 Estrogen receptor positive status [ER+]: Secondary | ICD-10-CM | POA: Diagnosis not present

## 2020-08-07 DIAGNOSIS — C50412 Malignant neoplasm of upper-outer quadrant of left female breast: Secondary | ICD-10-CM | POA: Diagnosis not present

## 2020-08-08 ENCOUNTER — Ambulatory Visit
Admission: RE | Admit: 2020-08-08 | Discharge: 2020-08-08 | Disposition: A | Discharge status: Home / Self Care | Payer: Medicare HMO | Source: Ambulatory Visit | Attending: Radiation Oncology | Admitting: Radiation Oncology

## 2020-08-08 DIAGNOSIS — Z170 Estrogen receptor positive status [ER+]: Secondary | ICD-10-CM | POA: Diagnosis not present

## 2020-08-08 DIAGNOSIS — C50412 Malignant neoplasm of upper-outer quadrant of left female breast: Secondary | ICD-10-CM | POA: Diagnosis not present

## 2020-08-09 ENCOUNTER — Other Ambulatory Visit: Payer: Self-pay

## 2020-08-09 ENCOUNTER — Ambulatory Visit
Admission: RE | Admit: 2020-08-09 | Discharge: 2020-08-09 | Disposition: A | Discharge status: Home / Self Care | Payer: Medicare HMO | Source: Ambulatory Visit | Attending: Radiation Oncology | Admitting: Radiation Oncology

## 2020-08-09 DIAGNOSIS — C50412 Malignant neoplasm of upper-outer quadrant of left female breast: Secondary | ICD-10-CM | POA: Diagnosis not present

## 2020-08-09 DIAGNOSIS — Z170 Estrogen receptor positive status [ER+]: Secondary | ICD-10-CM | POA: Diagnosis not present

## 2020-08-12 ENCOUNTER — Ambulatory Visit
Admission: RE | Admit: 2020-08-12 | Discharge: 2020-08-12 | Disposition: A | Discharge status: Home / Self Care | Payer: Medicare HMO | Source: Ambulatory Visit | Attending: Radiation Oncology | Admitting: Radiation Oncology

## 2020-08-12 ENCOUNTER — Other Ambulatory Visit: Payer: Self-pay

## 2020-08-12 DIAGNOSIS — Z170 Estrogen receptor positive status [ER+]: Secondary | ICD-10-CM | POA: Diagnosis not present

## 2020-08-12 DIAGNOSIS — C50412 Malignant neoplasm of upper-outer quadrant of left female breast: Secondary | ICD-10-CM | POA: Diagnosis not present

## 2020-08-12 NOTE — Progress Notes (Signed)
[place]  [phone] [phone]     ID: Cynthia Vasquez DOB: 23-May-1946  MR#: [id number]  GYK#:[id number]  Patient Care Team: Sueanne Margarita, DO as PCP - General (Internal Medicine) Rockwell Germany, RN as Oncology Nurse Navigator Mauro Kaufmann, RN as Oncology Nurse Navigator Magrinat, Virgie Dad, MD as Consulting Physician (Oncology) Alphonsa Overall, MD as Consulting Physician (General Surgery) Kyung Rudd, MD as Consulting Physician (Radiation Oncology) Wilford Corner, MD as Consulting Physician (Gastroenterology) Chauncey Cruel, MD OTHER MD:  CHIEF COMPLAINT: Estrogen receptor positive breast cancer  CURRENT TREATMENT: adjuvant radiation therapy   INTERVAL HISTORY: Cynthia Vasquez returns today for follow-up of her estrogen receptor positive breast cancer.   Since her last visit, she was referred back to Dr. Lisbeth Renshaw on 07/10/2020 to discuss adjuvant radiation therapy. She subsequently began treatment on 07/18/2020 and is scheduled through 09/05/2020.  Because of a very low hemoglobin and symptoms she received a unit of packed red cells on 05/14/2020.  Her hemoglobin is slowly recovering Lab Results  Component Value Date   HGB 9.5 (L) 07/02/2020   HGB 9.2 (L) 06/18/2020   HGB 9.0 (L) 06/11/2020   HGB 8.7 (L) 06/04/2020   HGB 8.8 (L) 05/28/2020   Lab Results  Component Value Date   CA2729 10.4 02/13/2020    REVIEW OF SYSTEMS: Cynthia Vasquez enjoyed the holidays, which she spent with family here and in [place].  She is walking about 30 minutes at a time.  She generally feels like she has more energy than before.  She denies shortness of breath cough pleurisy or chest pain or pressure.  She has a little bit of a rash in the upper left chest area.  A detailed review of systems today was otherwise stable   COVID 19 VACCINATION STATUS: Status post Pfizer x2, with booster pending   HISTORY OF CURRENT ILLNESS: From the original intake note:  Cynthia Vasquez  herself palpated a left breast mass, with associated dimpling, tenderness, and pinching pain. She underwent bilateral diagnostic mammography with tomography and left breast ultrasonography at [facility] on 11/09/2019 showing: breast density category C; 2.9 cm irregular mass in left breast at 2:30 corresponding to palpable abnormality; three enlarged left axillary lymph nodes.  Accordingly on 11/17/2019 she proceeded to biopsy of the left breast area in question. The pathology from this procedure ([id number]) showed: invasive mammary carcinoma, grade 2, e-cadherin positive. Prognostic indicators significant for: estrogen receptor, 95% positive and progesterone receptor, 95% positive, both with strong staining intensity. Proliferation marker Ki67 at 30%. HER2 negative by immunohistochemistry (1+).  The biopsied lymph node was positive for metastatic carcinoma.  The patient's subsequent history is as detailed below.   PAST MEDICAL HISTORY: Past Medical History:  Diagnosis Date  . Atrial septal defect    repaired age 81  . Cancer ([place]) 01/2020   IDC left breast  . GERD (gastroesophageal reflux disease)   . Glaucoma   . Headache    migraines until age 5- 89, have them 3-4 x year  . History of blood transfusion    with ASD surgery at age 25  . History of kidney stones    passed stones  . Hypertension   . Vitamin D deficiency     PAST SURGICAL HISTORY: Past Surgical History:  Procedure Laterality Date  . ASD REPAIR     age 38  . BREAST LUMPECTOMY WITH RADIOACTIVE SEED AND AXILLARY LYMPH NODE DISSECTION Left 01/12/2020   Procedure: LEFT BREAST LUMPECTOMY WITH RADIOACTIVE  SEED AND LEFT AXILLARY LYMPH NODE DISSECTION;  Surgeon: Alphonsa Overall, MD;  Location: [facility];  Service: General;  Laterality: Left;  PEC BLOCK  . COLONOSCOPY    . PORTACATH PLACEMENT Right 02/02/2020   Procedure: INSERTION PORT-A-CATH WITH ULTRASOUND GUIDANCE;  Surgeon: Alphonsa Overall, MD;  Location: [facility];  Service: General;  Laterality: Right;  . UPPER GI ENDOSCOPY      FAMILY HISTORY: Family History  Problem Relation Age of Onset  . Dementia Mother   . Heart attack Father   . Hypertension Father   . Breast cancer Sister 54  The patient's father died at age 104 from a myocardial infarction.  The patient's mother died at 56 with Alzheimer's disease.  The patient has 2 brothers, 2 sisters.  1 sister developed breast cancer at the age of 60.  The patient's daughter has a history of thyroid cancer.  She has not been genetically tested.  There is no other history of cancer in the family to the patient's knowledge.   GYNECOLOGIC HISTORY:  No LMP recorded (lmp unknown). Patient is postmenopausal. Menarche: 75 years old Age at first live birth: 75 years old [place] P 3 LMP age 18 Contraceptive approximately 2 years, with no complications HRT no  Hysterectomy?  No BSO?  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  Her husband Cynthia Vasquez (goes by Cynthia Vasquez") is also a retired Optometrist.  Son Cynthia Vasquez 51 lives in [place] and works for a Vasquez that processes linnen for hospitals, but he completed a masters in Chief Executive Officer and is looking for a different job; daughter Cynthia Vasquez lives in [place] and is a Oncologist; son Cynthia Vasquez lives in [place] and runs a business that puts antennaes on cell towers.  The patient has 8 grandchildren.  She attends our [employer].    ADVANCED DIRECTIVES: In the absence of any documents to the contrary the patient's husband is her healthcare power of attorney   HEALTH MAINTENANCE: Social History   Tobacco Use  . Smoking status: Never Smoker  . Smokeless tobacco: Never Used  Vaping Use  . Vaping Use: Never used  Substance Use Topics  . Alcohol use: No  . Drug use: No     Colonoscopy: "Less than 10 years ago"; [facility]  PAP: Remote  Bone density:  Never   No Known Allergies  Current Outpatient Medications  Medication Sig Dispense Refill  . acetaminophen (TYLENOL) 500 MG tablet Take 1 tablet (500 mg total) by mouth every 8 (eight) hours as needed. Take with aleve 220 md 60 tablet 0  . b complex vitamins tablet Take 1 tablet by mouth 3 (three) times a week.    . calcium gluconate 500 MG tablet Take 1 tablet (500 mg total) by mouth 3 (three) times daily.    . cholecalciferol (VITAMIN D) 1000 UNITS tablet Take 1,000 Units by mouth daily.    Marland Kitchen latanoprost (XALATAN) 0.005 % ophthalmic solution Place 1 drop into both eyes at bedtime.    . lidocaine-prilocaine (EMLA) cream Apply to affected area once 30 g 3  . loratadine (CLARITIN) 10 MG tablet Take 1 tablet (10 mg total) by mouth daily. 90 tablet 4  . losartan (COZAAR) 50 MG tablet Take 1 tablet (50 mg total) by mouth daily.    . Multiple Vitamins-Minerals (MULTIVITAMIN WITH MINERALS) tablet Take 1 tablet by mouth daily.    Marland Kitchen  naproxen sodium (ALEVE) 220 MG tablet Take 1 tablet (220 mg total) by mouth every 8 (eight) hours as needed. Take with tylenol 500 mg. 60 tablet 3  . pantoprazole (PROTONIX) 40 MG tablet Take 40 mg by mouth every other day.      No current facility-administered medications for this visit.    OBJECTIVE: White woman in no acute distress  Vitals:   08/13/20 0938  BP: (!) 141/70  Pulse: 74  Resp: 16  Temp: 97.9 F (36.6 C)  SpO2: 97%   Wt Readings from Last 3 Encounters:  08/13/20 141 lb 1.6 oz (64 kg)  07/10/20 144 lb 12.8 oz (65.7 kg)  07/02/20 146 lb 12.8 oz (66.6 kg)   Body mass index is 28.5 kg/m.    ECOG FS:1 - Symptomatic but completely ambulatory  Sclerae unicteric, EOMs intact Wearing a mask No cervical or supraclavicular adenopathy Lungs no rales or rhonchi Heart regular rate and rhythm Abd soft, nontender, positive bowel sounds MSK no focal spinal tenderness, no upper extremity lymphedema Neuro: nonfocal, well oriented, appropriate  affect Breasts: The right breast is unremarkable.  The left breast is status post lumpectomy and is currently receiving radiation.  There is minimal hyperpigmentation over the breast.  There is a rash in the superior anterior chest wall area, just below the shoulder.  In addition there is a palpable seroma measuring approximately 4 cm in the left axilla anteriorly.  LAB RESULTS:  CMP     Component Value Date/Time   NA 139 07/02/2020 0840   K 3.7 07/02/2020 0840   CL 105 07/02/2020 0840   CO2 25 07/02/2020 0840   GLUCOSE 117 (H) 07/02/2020 0840   BUN 19 07/02/2020 0840   CREATININE 0.87 07/02/2020 0840   CREATININE 1.06 (H) 11/29/2019 0845   CALCIUM 9.6 07/02/2020 0840   PROT 6.6 07/02/2020 0840   ALBUMIN 3.9 07/02/2020 0840   AST 26 07/02/2020 0840   AST 23 11/29/2019 0845   ALT 15 07/02/2020 0840   ALT 17 11/29/2019 0845   ALKPHOS 40 07/02/2020 0840   BILITOT 0.5 07/02/2020 0840   BILITOT 0.6 11/29/2019 0845   GFRNONAA >60 07/02/2020 0840   GFRNONAA 52 (L) 11/29/2019 0845   GFRAA 58 (L) 04/30/2020 1020   GFRAA >60 11/29/2019 0845    No results found for: TOTALPROTELP, ALBUMINELP, A1GS, A2GS, BETS, BETA2SER, GAMS, MSPIKE, SPEI  Lab Results  Component Value Date   WBC 9.1 07/02/2020   NEUTROABS 7.1 07/02/2020   HGB 9.5 (L) 07/02/2020   HCT 28.8 (L) 07/02/2020   MCV 95.0 07/02/2020   PLT 298 07/02/2020    No results found for: LABCA2  No components found for: AOZHYQ657  No results for input(s): INR in the last 168 hours.  No results found for: LABCA2  No results found for: QIO962  No results found for: XBM841  No results found for: LKG401  Lab Results  Component Value Date   CA2729 10.4 02/13/2020    No components found for: HGQUANT  No results found for: CEA1 / No results found for: CEA1   No results found for: AFPTUMOR  No results found for: CHROMOGRNA  No results found for: KPAFRELGTCHN, LAMBDASER, KAPLAMBRATIO (kappa/lambda light chains)  No  results found for: HGBA, HGBA2QUANT, HGBFQUANT, HGBSQUAN (Hemoglobinopathy evaluation)   Lab Results  Component Value Date   LDH 230 (H) 06/28/2019    No results found for: IRON, TIBC, IRONPCTSAT (Iron and TIBC)  Lab Results  Component Value Date  FERRITIN 626 (H) 07/03/2019    Urinalysis    Component Value Date/Time   COLORURINE YELLOW 06/11/2013 2007   APPEARANCEUR CLEAR 06/11/2013 2007   LABSPEC 1.017 06/11/2013 2007   PHURINE 6.5 06/11/2013 2007   GLUCOSEU NEGATIVE 06/11/2013 2007   HGBUR NEGATIVE 06/11/2013 2007   [place] NEGATIVE 06/11/2013 2007   [place] NEGATIVE 06/11/2013 2007   PROTEINUR NEGATIVE 06/11/2013 2007   UROBILINOGEN 0.2 06/11/2013 2007   NITRITE NEGATIVE 06/11/2013 2007   LEUKOCYTESUR NEGATIVE 06/11/2013 2007    STUDIES: No results found.   ELIGIBLE FOR AVAILABLE RESEARCH PROTOCOL: AET  ASSESSMENT: 75 y.o. [place] woman status post left breast upper outer quadrant biopsy 11/17/2019 for a clinical T2 N1, stage IIA invasive ductal carcinoma, grade 2, estrogen and progesterone receptor positive, HER-2 not amplified, with an MIB-1 of 30%.  (1) status post left lumpectomy and axillary lymph node dissection 01/12/2020 for a pT2 pN2, stage IIIA invasive ductal carcinoma, grade 3, with negative margins  (a) a total of 14 axillary lymph nodes removed, 8 positive, with extracapsular extension  (2) adjuvant chemotherapy consisting of doxorubicin and cyclophosphamide in dose dense fashion x4 started 02/13/2020, completed 04/10/2020, followed by weekly paclitaxel x12 started 04/30/2020, last dose 06/18/2020  (a) echocardiogram 02/08/2020 showed an ejection fraction in the 65/70%  (b) final 2 cycles of doxorubicin/cyclophosphamide given 21 days apart and 18% dose reduced  (c) final 4 doses of paclitaxel omitted secondary to fatigue and neuropathy  (3) adjuvant radiation to be completed 09/05/2020  (4) antiestrogens to start at the completion of  local treatment   PLAN: Ronika is now half a year out from definitive surgery for her breast cancer with no evidence of disease recurrence.  She is about 2 months out from completion of her chemotherapy.  She is recovering well from that, and although we are having a little trouble getting blood out of her port today I feel comfortable her hemoglobin is going to be greater than 10.  She is tolerating radiation well and she is maintaining an excellent functional status.  She will complete radiation early February.  She will see a surgeon at that time for port removal.  She does have a seroma in the left axilla and that can be reviewed at that time as well.  She will be ready to start antiestrogens late February.  We discussed the difference between tamoxifen and anastrozole in detail. She understands that anastrozole and the aromatase inhibitors in general work by blocking estrogen production. Accordingly vaginal dryness, decrease in bone density, and of course hot flashes can result. The aromatase inhibitors can also negatively affect the cholesterol profile, although that is a minor effect. One out of 5 women on aromatase inhibitors we will feel "old and achy". This arthralgia/myalgia syndrome, which resembles fibromyalgia clinically, does resolve with stopping the medications. Accordingly this is not a reason to not try an aromatase inhibitor but it is a frequent reason to stop it (in other words 20% of women will not be able to tolerate these medications).  Tamoxifen on the other hand does not block estrogen production. It does not "take away a woman's estrogen". It blocks the estrogen receptor in breast cells. Like anastrozole, it can also cause hot flashes. As opposed to anastrozole, tamoxifen has many estrogen-like effects. It is technically an estrogen receptor modulator. This means that in some tissues tamoxifen works like estrogen-- for example it helps strengthen the bones. It tends to improve  the cholesterol profile. It can cause thickening of  the endometrial lining, and even endometrial polyps or rarely cancer of the uterus.(The risk of uterine cancer due to tamoxifen is one additional cancer per thousand women year). It can cause vaginal wetness or stickiness. It can cause blood clots through this estrogen-like effect--the risk of blood clots with tamoxifen is exactly the same as with birth control pills or hormone replacement.  Neither of these agents causes mood changes or weight gain, despite the popular belief that they can have these side effects. We have data from studies comparing either of these drugs with placebo, and in those cases the control group had the same amount of weight gain and depression as the group that took the drug.  I resumed her hydrochlorothiazide today since her blood pressure is now back to baseline, slightly high.  I am going to recommend anastrozole and if she tolerates it well we will obtain a bone density at the same time as her next mammography which will be in May.  Total encounter time 35 minutes.Sarajane Jews C. Rylan Bernard, MD 08/13/2020 9:41 AM Medical Oncology and Hematology [facility] [place], [place] [address] Tel. [phone]    Fax. [phone]   I, Wilburn Mylar, am acting as scribe for Dr. Virgie Dad. Kaoru Benda.  I, Lurline Del MD, have reviewed the above documentation for accuracy and completeness, and I agree with the above.   *Total Encounter Time as defined by the Centers for Medicare and Medicaid Services includes, in addition to the face-to-face time of a patient visit (documented in the note above) non-face-to-face time: obtaining and reviewing outside history, ordering and reviewing medications, tests or procedures, care coordination (communications with other health care professionals or caregivers) and documentation in the medical record.

## 2020-08-13 ENCOUNTER — Inpatient Hospital Stay: Payer: Medicare HMO | Attending: Oncology | Admitting: Oncology

## 2020-08-13 ENCOUNTER — Other Ambulatory Visit: Payer: Self-pay

## 2020-08-13 ENCOUNTER — Inpatient Hospital Stay: Payer: Medicare HMO

## 2020-08-13 ENCOUNTER — Ambulatory Visit
Admission: RE | Admit: 2020-08-13 | Discharge: 2020-08-13 | Disposition: A | Discharge status: Home / Self Care | Payer: Medicare HMO | Source: Ambulatory Visit | Attending: Radiation Oncology | Admitting: Radiation Oncology

## 2020-08-13 VITALS — BP 141/70 | HR 74 | Temp 97.9°F | Resp 16 | Ht 59.0 in | Wt 141.1 lb

## 2020-08-13 DIAGNOSIS — C50412 Malignant neoplasm of upper-outer quadrant of left female breast: Secondary | ICD-10-CM | POA: Diagnosis not present

## 2020-08-13 DIAGNOSIS — Z79811 Long term (current) use of aromatase inhibitors: Secondary | ICD-10-CM | POA: Insufficient documentation

## 2020-08-13 DIAGNOSIS — Z8249 Family history of ischemic heart disease and other diseases of the circulatory system: Secondary | ICD-10-CM | POA: Diagnosis not present

## 2020-08-13 DIAGNOSIS — Z923 Personal history of irradiation: Secondary | ICD-10-CM | POA: Insufficient documentation

## 2020-08-13 DIAGNOSIS — Z87442 Personal history of urinary calculi: Secondary | ICD-10-CM | POA: Diagnosis not present

## 2020-08-13 DIAGNOSIS — Z95828 Presence of other vascular implants and grafts: Secondary | ICD-10-CM

## 2020-08-13 DIAGNOSIS — R590 Localized enlarged lymph nodes: Secondary | ICD-10-CM | POA: Diagnosis not present

## 2020-08-13 DIAGNOSIS — K219 Gastro-esophageal reflux disease without esophagitis: Secondary | ICD-10-CM | POA: Diagnosis not present

## 2020-08-13 DIAGNOSIS — Z79899 Other long term (current) drug therapy: Secondary | ICD-10-CM | POA: Insufficient documentation

## 2020-08-13 DIAGNOSIS — N6321 Unspecified lump in the left breast, upper outer quadrant: Secondary | ICD-10-CM | POA: Insufficient documentation

## 2020-08-13 DIAGNOSIS — G629 Polyneuropathy, unspecified: Secondary | ICD-10-CM | POA: Insufficient documentation

## 2020-08-13 DIAGNOSIS — Z170 Estrogen receptor positive status [ER+]: Secondary | ICD-10-CM | POA: Insufficient documentation

## 2020-08-13 DIAGNOSIS — Z808 Family history of malignant neoplasm of other organs or systems: Secondary | ICD-10-CM | POA: Diagnosis not present

## 2020-08-13 DIAGNOSIS — Z818 Family history of other mental and behavioral disorders: Secondary | ICD-10-CM | POA: Diagnosis not present

## 2020-08-13 DIAGNOSIS — Z803 Family history of malignant neoplasm of breast: Secondary | ICD-10-CM | POA: Insufficient documentation

## 2020-08-13 DIAGNOSIS — R5383 Other fatigue: Secondary | ICD-10-CM | POA: Diagnosis not present

## 2020-08-13 DIAGNOSIS — I10 Essential (primary) hypertension: Secondary | ICD-10-CM | POA: Insufficient documentation

## 2020-08-13 LAB — CBC WITH DIFFERENTIAL/PLATELET
Abs Immature Granulocytes: 0.01 10*3/uL (ref 0.00–0.07)
Basophils Absolute: 0.1 10*3/uL (ref 0.0–0.1)
Basophils Relative: 1 %
Eosinophils Absolute: 0.2 10*3/uL (ref 0.0–0.5)
Eosinophils Relative: 4 %
HCT: 34.3 % — ABNORMAL LOW (ref 36.0–46.0)
Hemoglobin: 11.2 g/dL — ABNORMAL LOW (ref 12.0–15.0)
Immature Granulocytes: 0 %
Lymphocytes Relative: 18 %
Lymphs Abs: 0.9 10*3/uL (ref 0.7–4.0)
MCH: 29.4 pg (ref 26.0–34.0)
MCHC: 32.7 g/dL (ref 30.0–36.0)
MCV: 90 fL (ref 80.0–100.0)
Monocytes Absolute: 0.5 10*3/uL (ref 0.1–1.0)
Monocytes Relative: 11 %
Neutro Abs: 3 10*3/uL (ref 1.7–7.7)
Neutrophils Relative %: 66 %
Platelets: 247 10*3/uL (ref 150–400)
RBC: 3.81 MIL/uL — ABNORMAL LOW (ref 3.87–5.11)
RDW: 14.5 % (ref 11.5–15.5)
WBC: 4.6 10*3/uL (ref 4.0–10.5)
nRBC: 0 % (ref 0.0–0.2)

## 2020-08-13 LAB — COMPREHENSIVE METABOLIC PANEL
ALT: 16 U/L (ref 0–44)
AST: 22 U/L (ref 15–41)
Albumin: 4.1 g/dL (ref 3.5–5.0)
Alkaline Phosphatase: 42 U/L (ref 38–126)
Anion gap: 8 (ref 5–15)
BUN: 20 mg/dL (ref 8–23)
CO2: 28 mmol/L (ref 22–32)
Calcium: 10.1 mg/dL (ref 8.9–10.3)
Chloride: 104 mmol/L (ref 98–111)
Creatinine, Ser: 0.83 mg/dL (ref 0.44–1.00)
GFR, Estimated: >60 mL/min (ref >60)
Glucose, Bld: 91 mg/dL (ref 70–99)
Potassium: 4 mmol/L (ref 3.5–5.1)
Sodium: 140 mmol/L (ref 135–145)
Total Bilirubin: 0.5 mg/dL (ref 0.3–1.2)
Total Protein: 7.1 g/dL (ref 6.5–8.1)

## 2020-08-13 MED ORDER — ALTEPLASE 2 MG IJ SOLR
2.0000 mg | Freq: Once | INTRAMUSCULAR | Status: AC
  Administered 2020-08-13: 2 mg
  Filled 2020-08-13: qty 2

## 2020-08-13 MED ORDER — ANASTROZOLE 1 MG PO TABS: 1.0000 mg | ORAL_TABLET | Freq: Every day | ORAL | 4 refills | Status: DC

## 2020-08-13 MED ORDER — SODIUM CHLORIDE 0.9% FLUSH
10.0000 mL | Freq: Once | INTRAVENOUS | Status: AC
  Administered 2020-08-13: 10 mL
  Filled 2020-08-13: qty 10

## 2020-08-13 MED ORDER — HEPARIN SOD (PORK) LOCK FLUSH 100 UNIT/ML IV SOLN
500.0000 [IU] | Freq: Once | INTRAVENOUS | Status: DC
  Filled 2020-08-13: qty 5

## 2020-08-13 MED ORDER — ALTEPLASE 2 MG IJ SOLR
INTRAMUSCULAR | Status: AC
  Filled 2020-08-13: qty 2

## 2020-08-13 MED ORDER — HYDROCHLOROTHIAZIDE 25 MG PO TABS: 25.0000 mg | ORAL_TABLET | Freq: Every day | ORAL | 4 refills | Status: DC

## 2020-08-14 ENCOUNTER — Ambulatory Visit
Admission: RE | Admit: 2020-08-14 | Discharge: 2020-08-14 | Disposition: A | Discharge status: Home / Self Care | Payer: Medicare HMO | Source: Ambulatory Visit | Attending: Radiation Oncology | Admitting: Radiation Oncology

## 2020-08-14 DIAGNOSIS — Z170 Estrogen receptor positive status [ER+]: Secondary | ICD-10-CM | POA: Diagnosis not present

## 2020-08-14 DIAGNOSIS — C50412 Malignant neoplasm of upper-outer quadrant of left female breast: Secondary | ICD-10-CM | POA: Diagnosis not present

## 2020-08-15 ENCOUNTER — Ambulatory Visit
Admission: RE | Admit: 2020-08-15 | Discharge: 2020-08-15 | Disposition: A | Discharge status: Home / Self Care | Payer: Medicare HMO | Source: Ambulatory Visit | Attending: Radiation Oncology | Admitting: Radiation Oncology

## 2020-08-15 DIAGNOSIS — C50412 Malignant neoplasm of upper-outer quadrant of left female breast: Secondary | ICD-10-CM | POA: Diagnosis not present

## 2020-08-15 DIAGNOSIS — Z170 Estrogen receptor positive status [ER+]: Secondary | ICD-10-CM | POA: Diagnosis not present

## 2020-08-16 ENCOUNTER — Other Ambulatory Visit: Payer: Self-pay

## 2020-08-16 ENCOUNTER — Ambulatory Visit
Admission: RE | Admit: 2020-08-16 | Discharge: 2020-08-16 | Disposition: A | Discharge status: Home / Self Care | Payer: Medicare HMO | Source: Ambulatory Visit | Attending: Radiation Oncology | Admitting: Radiation Oncology

## 2020-08-16 DIAGNOSIS — Z170 Estrogen receptor positive status [ER+]: Secondary | ICD-10-CM | POA: Diagnosis not present

## 2020-08-16 DIAGNOSIS — C50412 Malignant neoplasm of upper-outer quadrant of left female breast: Secondary | ICD-10-CM | POA: Diagnosis not present

## 2020-08-19 ENCOUNTER — Ambulatory Visit
Admission: RE | Admit: 2020-08-19 | Discharge: 2020-08-19 | Disposition: A | Discharge status: Home / Self Care | Payer: Medicare HMO | Source: Ambulatory Visit | Attending: Radiation Oncology | Admitting: Radiation Oncology

## 2020-08-19 ENCOUNTER — Other Ambulatory Visit: Payer: Self-pay

## 2020-08-19 DIAGNOSIS — C50412 Malignant neoplasm of upper-outer quadrant of left female breast: Secondary | ICD-10-CM | POA: Diagnosis not present

## 2020-08-19 DIAGNOSIS — Z170 Estrogen receptor positive status [ER+]: Secondary | ICD-10-CM | POA: Diagnosis not present

## 2020-08-20 ENCOUNTER — Ambulatory Visit
Admission: RE | Admit: 2020-08-20 | Discharge: 2020-08-20 | Disposition: A | Discharge status: Home / Self Care | Payer: Medicare HMO | Source: Ambulatory Visit | Attending: Radiation Oncology | Admitting: Radiation Oncology

## 2020-08-20 ENCOUNTER — Other Ambulatory Visit: Payer: Self-pay

## 2020-08-20 DIAGNOSIS — C50412 Malignant neoplasm of upper-outer quadrant of left female breast: Secondary | ICD-10-CM | POA: Diagnosis not present

## 2020-08-20 DIAGNOSIS — Z170 Estrogen receptor positive status [ER+]: Secondary | ICD-10-CM | POA: Diagnosis not present

## 2020-08-21 ENCOUNTER — Other Ambulatory Visit: Payer: Self-pay

## 2020-08-21 ENCOUNTER — Ambulatory Visit
Admission: RE | Admit: 2020-08-21 | Discharge: 2020-08-21 | Disposition: A | Discharge status: Home / Self Care | Payer: Medicare HMO | Source: Ambulatory Visit | Attending: Radiation Oncology | Admitting: Radiation Oncology

## 2020-08-21 DIAGNOSIS — C50412 Malignant neoplasm of upper-outer quadrant of left female breast: Secondary | ICD-10-CM | POA: Diagnosis not present

## 2020-08-21 DIAGNOSIS — Z170 Estrogen receptor positive status [ER+]: Secondary | ICD-10-CM | POA: Diagnosis not present

## 2020-08-22 ENCOUNTER — Other Ambulatory Visit: Payer: Self-pay

## 2020-08-22 ENCOUNTER — Ambulatory Visit
Admission: RE | Admit: 2020-08-22 | Discharge: 2020-08-22 | Disposition: A | Discharge status: Home / Self Care | Payer: Medicare HMO | Source: Ambulatory Visit | Attending: Radiation Oncology | Admitting: Radiation Oncology

## 2020-08-22 DIAGNOSIS — C50412 Malignant neoplasm of upper-outer quadrant of left female breast: Secondary | ICD-10-CM | POA: Diagnosis not present

## 2020-08-22 DIAGNOSIS — Z170 Estrogen receptor positive status [ER+]: Secondary | ICD-10-CM | POA: Diagnosis not present

## 2020-08-23 ENCOUNTER — Other Ambulatory Visit: Payer: Self-pay

## 2020-08-23 ENCOUNTER — Ambulatory Visit: Payer: Medicare HMO | Admitting: Radiation Oncology

## 2020-08-23 ENCOUNTER — Ambulatory Visit
Admission: RE | Admit: 2020-08-23 | Discharge: 2020-08-23 | Disposition: A | Discharge status: Home / Self Care | Payer: Medicare HMO | Source: Ambulatory Visit | Attending: Radiation Oncology | Admitting: Radiation Oncology

## 2020-08-23 DIAGNOSIS — Z170 Estrogen receptor positive status [ER+]: Secondary | ICD-10-CM | POA: Diagnosis not present

## 2020-08-23 DIAGNOSIS — C50412 Malignant neoplasm of upper-outer quadrant of left female breast: Secondary | ICD-10-CM

## 2020-08-23 MED ORDER — SONAFINE EX EMUL
1.0000 "application " | Freq: Once | CUTANEOUS | Status: AC
  Administered 2020-08-23: 1 via TOPICAL

## 2020-08-26 ENCOUNTER — Other Ambulatory Visit: Payer: Self-pay

## 2020-08-26 ENCOUNTER — Ambulatory Visit
Admission: RE | Admit: 2020-08-26 | Discharge: 2020-08-26 | Disposition: A | Discharge status: Home / Self Care | Payer: Medicare HMO | Source: Ambulatory Visit | Attending: Radiation Oncology | Admitting: Radiation Oncology

## 2020-08-26 DIAGNOSIS — Z170 Estrogen receptor positive status [ER+]: Secondary | ICD-10-CM | POA: Diagnosis not present

## 2020-08-26 DIAGNOSIS — C50412 Malignant neoplasm of upper-outer quadrant of left female breast: Secondary | ICD-10-CM | POA: Diagnosis not present

## 2020-08-27 ENCOUNTER — Ambulatory Visit
Admission: RE | Admit: 2020-08-27 | Discharge: 2020-08-27 | Disposition: A | Discharge status: Home / Self Care | Payer: Medicare HMO | Source: Ambulatory Visit | Attending: Radiation Oncology | Admitting: Radiation Oncology

## 2020-08-27 ENCOUNTER — Other Ambulatory Visit: Payer: Self-pay

## 2020-08-27 DIAGNOSIS — C50412 Malignant neoplasm of upper-outer quadrant of left female breast: Secondary | ICD-10-CM | POA: Diagnosis not present

## 2020-08-27 DIAGNOSIS — Z170 Estrogen receptor positive status [ER+]: Secondary | ICD-10-CM | POA: Diagnosis not present

## 2020-08-28 ENCOUNTER — Ambulatory Visit
Admission: RE | Admit: 2020-08-28 | Discharge: 2020-08-28 | Disposition: A | Discharge status: Home / Self Care | Payer: Medicare HMO | Source: Ambulatory Visit | Attending: Radiation Oncology | Admitting: Radiation Oncology

## 2020-08-28 DIAGNOSIS — Z170 Estrogen receptor positive status [ER+]: Secondary | ICD-10-CM | POA: Diagnosis not present

## 2020-08-28 DIAGNOSIS — C50412 Malignant neoplasm of upper-outer quadrant of left female breast: Secondary | ICD-10-CM | POA: Diagnosis not present

## 2020-08-29 ENCOUNTER — Other Ambulatory Visit: Payer: Self-pay

## 2020-08-29 ENCOUNTER — Ambulatory Visit
Admission: RE | Admit: 2020-08-29 | Discharge: 2020-08-29 | Disposition: A | Discharge status: Home / Self Care | Payer: Medicare HMO | Source: Ambulatory Visit | Attending: Radiation Oncology | Admitting: Radiation Oncology

## 2020-08-29 ENCOUNTER — Ambulatory Visit: Payer: Medicare HMO

## 2020-08-29 DIAGNOSIS — Z170 Estrogen receptor positive status [ER+]: Secondary | ICD-10-CM | POA: Diagnosis not present

## 2020-08-29 DIAGNOSIS — C50412 Malignant neoplasm of upper-outer quadrant of left female breast: Secondary | ICD-10-CM | POA: Diagnosis not present

## 2020-08-30 ENCOUNTER — Ambulatory Visit
Admission: RE | Admit: 2020-08-30 | Discharge: 2020-08-30 | Disposition: A | Discharge status: Home / Self Care | Payer: Medicare HMO | Source: Ambulatory Visit | Attending: Radiation Oncology | Admitting: Radiation Oncology

## 2020-08-30 ENCOUNTER — Other Ambulatory Visit: Payer: Self-pay

## 2020-08-30 ENCOUNTER — Ambulatory Visit: Payer: Medicare HMO

## 2020-08-30 DIAGNOSIS — C50412 Malignant neoplasm of upper-outer quadrant of left female breast: Secondary | ICD-10-CM | POA: Diagnosis not present

## 2020-08-30 DIAGNOSIS — Z170 Estrogen receptor positive status [ER+]: Secondary | ICD-10-CM | POA: Diagnosis not present

## 2020-09-02 ENCOUNTER — Other Ambulatory Visit: Payer: Self-pay

## 2020-09-02 ENCOUNTER — Ambulatory Visit
Admission: RE | Admit: 2020-09-02 | Discharge: 2020-09-02 | Disposition: A | Discharge status: Home / Self Care | Payer: Medicare HMO | Source: Ambulatory Visit | Attending: Radiation Oncology | Admitting: Radiation Oncology

## 2020-09-02 DIAGNOSIS — Z170 Estrogen receptor positive status [ER+]: Secondary | ICD-10-CM | POA: Diagnosis not present

## 2020-09-02 DIAGNOSIS — C50412 Malignant neoplasm of upper-outer quadrant of left female breast: Secondary | ICD-10-CM | POA: Diagnosis not present

## 2020-09-03 ENCOUNTER — Encounter: Payer: Self-pay | Admitting: *Deleted

## 2020-09-03 ENCOUNTER — Other Ambulatory Visit: Payer: Self-pay

## 2020-09-03 ENCOUNTER — Ambulatory Visit
Admission: RE | Admit: 2020-09-03 | Discharge: 2020-09-03 | Disposition: A | Discharge status: Home / Self Care | Payer: Medicare HMO | Source: Ambulatory Visit | Attending: Radiation Oncology | Admitting: Radiation Oncology

## 2020-09-03 DIAGNOSIS — U071 COVID-19: Secondary | ICD-10-CM | POA: Diagnosis not present

## 2020-09-03 DIAGNOSIS — C50412 Malignant neoplasm of upper-outer quadrant of left female breast: Secondary | ICD-10-CM | POA: Insufficient documentation

## 2020-09-03 DIAGNOSIS — Z170 Estrogen receptor positive status [ER+]: Secondary | ICD-10-CM | POA: Diagnosis not present

## 2020-09-04 ENCOUNTER — Ambulatory Visit: Payer: Medicare HMO

## 2020-09-04 ENCOUNTER — Other Ambulatory Visit: Payer: Self-pay

## 2020-09-04 ENCOUNTER — Ambulatory Visit
Admission: RE | Admit: 2020-09-04 | Discharge: 2020-09-04 | Disposition: A | Discharge status: Home / Self Care | Payer: Medicare HMO | Source: Ambulatory Visit | Attending: Radiation Oncology | Admitting: Radiation Oncology

## 2020-09-04 DIAGNOSIS — C50412 Malignant neoplasm of upper-outer quadrant of left female breast: Secondary | ICD-10-CM | POA: Diagnosis not present

## 2020-09-04 DIAGNOSIS — Z170 Estrogen receptor positive status [ER+]: Secondary | ICD-10-CM | POA: Diagnosis not present

## 2020-09-05 ENCOUNTER — Other Ambulatory Visit: Payer: Self-pay

## 2020-09-05 ENCOUNTER — Ambulatory Visit
Admission: RE | Admit: 2020-09-05 | Discharge: 2020-09-05 | Disposition: A | Discharge status: Home / Self Care | Payer: Medicare HMO | Source: Ambulatory Visit | Attending: Radiation Oncology | Admitting: Radiation Oncology

## 2020-09-05 DIAGNOSIS — Z170 Estrogen receptor positive status [ER+]: Secondary | ICD-10-CM | POA: Diagnosis not present

## 2020-09-05 DIAGNOSIS — C50412 Malignant neoplasm of upper-outer quadrant of left female breast: Secondary | ICD-10-CM | POA: Diagnosis not present

## 2020-09-06 NOTE — Progress Notes (Signed)
  Radiation Oncology         [phone]) [phone] ________________________________  Name: Cynthia Vasquez MRN: [id number]  Date: 08/06/2020  DOB: [birth year]  SIMULATION NOTE   NARRATIVE:  The patient underwent simulation today for ongoing radiation therapy.  The existing CT study set was employed for the purpose of virtual treatment planning.  The target and avoidance structures were reviewed and modified as necessary.  Treatment planning then occurred.  The radiation boost prescription was entered and confirmed.  A total of 3 complex treatment devices were fabricated in the form of multi-leaf collimators to shape radiation around the targets while maximally excluding nearby normal structures. I have requested : Isodose Plan.    PLAN:  This modified radiation beam arrangement is intended to continue the current radiation dose to an additional 10 Gy in 5 fractions for a total cumulative dose of 60.4 Gy.    ------------------------------------------------  Jodelle Gross, MD, PhD

## 2020-09-09 ENCOUNTER — Encounter: Payer: Self-pay | Admitting: Radiation Oncology

## 2020-09-09 NOTE — Progress Notes (Signed)
  Patient Name: Cynthia Vasquez MRN: [id number] DOB: 05-Aug-1945 Referring Physician: Lurline Del (Profile Not Attached) Date of Service: 09/05/2020 [facility], [place]                                                        End Of Treatment Note  Diagnoses: C50.412-Malignant neoplasm of upper-outer quadrant of left female breast  Cancer Staging: Stage IIB, pT2N2aM0 grade 3, ER/PR positive invasive ductal carcinoma of the left breast  Intent: Curative  Radiation Treatment Dates: 07/18/2020 through 09/05/2020 Site Technique Total Dose (Gy) Dose per Fx (Gy) Completed Fx Beam Energies  Breast, Left: Breast_Lt 3D 50.4/50.4 1.8 28/28 6X, 10X  Breast, Left: Breast_Lt_SCLV 3D 50.4/50.4 1.8 28/28 6X, 10X  Breast, Left: Breast_Lt_Bst 3D 10/10 2 5/5 6X, 10X   Narrative: The patient tolerated radiation therapy relatively well. She had some cording of her left axilla prior to starting treatment. During radiation, she developed dry desquamation without severe changes of the skin in the treatment field.   Plan: The patient will receive a 1 month follow up call from the radiation department. She will continue follow up with Dr. Jana Hakim as well in medical oncology and with physical therapy given her cording in the axilla prior to starting treatment.  ________________________________________________    Carola Rhine, PAC

## 2020-09-17 DIAGNOSIS — H401131 Primary open-angle glaucoma, bilateral, mild stage: Secondary | ICD-10-CM | POA: Diagnosis not present

## 2020-09-17 DIAGNOSIS — H2513 Age-related nuclear cataract, bilateral: Secondary | ICD-10-CM | POA: Diagnosis not present

## 2020-09-17 DIAGNOSIS — H524 Presbyopia: Secondary | ICD-10-CM | POA: Diagnosis not present

## 2020-09-30 DIAGNOSIS — C50912 Malignant neoplasm of unspecified site of left female breast: Secondary | ICD-10-CM | POA: Diagnosis not present

## 2020-10-01 DIAGNOSIS — U071 COVID-19: Secondary | ICD-10-CM | POA: Diagnosis not present

## 2020-10-09 ENCOUNTER — Other Ambulatory Visit: Payer: Self-pay | Admitting: General Surgery

## 2020-10-09 DIAGNOSIS — N6489 Other specified disorders of breast: Secondary | ICD-10-CM

## 2020-10-15 ENCOUNTER — Ambulatory Visit
Admission: RE | Admit: 2020-10-15 | Discharge: 2020-10-15 | Disposition: A | Discharge status: Home / Self Care | Payer: Medicare HMO | Source: Ambulatory Visit | Attending: General Surgery | Admitting: General Surgery

## 2020-10-15 ENCOUNTER — Other Ambulatory Visit: Payer: Self-pay

## 2020-10-15 DIAGNOSIS — L7634 Postprocedural seroma of skin and subcutaneous tissue following other procedure: Secondary | ICD-10-CM | POA: Diagnosis not present

## 2020-10-15 DIAGNOSIS — N6489 Other specified disorders of breast: Secondary | ICD-10-CM

## 2020-10-15 DIAGNOSIS — R922 Inconclusive mammogram: Secondary | ICD-10-CM | POA: Diagnosis not present

## 2020-10-17 ENCOUNTER — Other Ambulatory Visit: Payer: Self-pay | Admitting: Family Medicine

## 2020-10-17 DIAGNOSIS — Z9889 Other specified postprocedural states: Secondary | ICD-10-CM

## 2020-10-21 ENCOUNTER — Telehealth: Payer: Self-pay

## 2020-10-21 NOTE — Telephone Encounter (Signed)
Returned call to pt about dizziness and headache over the weekend. PCP wanted to make sure she let Dr Jana Hakim know. Will make MD aware. Encouraged pt to see PCP if possible, since she still has somewhat of a headache. Pt verbalized understanding.

## 2020-10-22 ENCOUNTER — Ambulatory Visit (HOSPITAL_COMMUNITY)
Admission: RE | Admit: 2020-10-22 | Discharge: 2020-10-22 | Disposition: A | Discharge status: Home / Self Care | Payer: Medicare HMO | Source: Ambulatory Visit | Attending: Internal Medicine | Admitting: Internal Medicine

## 2020-10-22 ENCOUNTER — Other Ambulatory Visit: Payer: Self-pay | Admitting: Internal Medicine

## 2020-10-22 ENCOUNTER — Other Ambulatory Visit: Payer: Self-pay

## 2020-10-22 DIAGNOSIS — R42 Dizziness and giddiness: Secondary | ICD-10-CM | POA: Insufficient documentation

## 2020-10-22 DIAGNOSIS — I10 Essential (primary) hypertension: Secondary | ICD-10-CM | POA: Diagnosis not present

## 2020-10-22 DIAGNOSIS — Z170 Estrogen receptor positive status [ER+]: Secondary | ICD-10-CM | POA: Diagnosis not present

## 2020-10-22 DIAGNOSIS — C50412 Malignant neoplasm of upper-outer quadrant of left female breast: Secondary | ICD-10-CM | POA: Diagnosis not present

## 2020-10-22 DIAGNOSIS — R519 Headache, unspecified: Secondary | ICD-10-CM | POA: Diagnosis not present

## 2020-10-22 MED ORDER — GADOBUTROL 1 MMOL/ML IV SOLN
6.0000 mL | Freq: Once | INTRAVENOUS | Status: AC | PRN
  Administered 2020-10-22: 6 mL via INTRAVENOUS

## 2020-11-01 DIAGNOSIS — U071 COVID-19: Secondary | ICD-10-CM | POA: Diagnosis not present

## 2020-11-04 NOTE — Progress Notes (Signed)
[place]  [phone] [phone]     ID: Cynthia Vasquez DOB: 06/25/1946  MR#: [id number]  KNL#:[id number]  Patient Care Team: Sueanne Margarita, DO as PCP - General (Internal Medicine) Rockwell Germany, RN as Oncology Nurse Navigator Mauro Kaufmann, RN as Oncology Nurse Navigator Champagne Paletta, Virgie Dad, MD as Consulting Physician (Oncology) Alphonsa Overall, MD as Consulting Physician (General Surgery) Kyung Rudd, MD as Consulting Physician (Radiation Oncology) Wilford Corner, MD as Consulting Physician (Gastroenterology) Chauncey Cruel, MD OTHER MD:     CHIEF COMPLAINT: Estrogen receptor positive breast cancer  CURRENT TREATMENT: anastrozole   INTERVAL HISTORY: I attempted to connect with Cynthia Vasquez by phone today for follow-up of her estrogen receptor positive breast cancer.  However there was no answer on repeated calls.  I left a message for her to contact us  Since her last visit, she completed radiation therapy on 09/05/2020.  She was scheduled to start d anastrozole in late 09/2020  She presented to Dr. Marlou Starks with a palpable abnormality in her left axilla. She underwent left diagnostic mammography and left breast ultrasonography on 10/15/2020 showing: breast density category C; complex collection/seroma within left axilla.  She proceeded to aspiration of the seroma the same day.  She also underwent brain MRI on 10/22/2020 for dizziness. This showed no acute infarction, hemorrhage, or mass and no abnormal enhancement.  She is scheduled for annual mammography on 11/21/2020.  Because of a very low hemoglobin and symptoms she received a unit of packed red cells on 05/14/2020.  Her hemoglobin is slowly recovering Lab Results  Component Value Date   HGB 11.2 (L) 08/13/2020   HGB 9.5 (L) 07/02/2020   HGB 9.2 (L) 06/18/2020   HGB 9.0 (L) 06/11/2020   HGB 8.7 (L) 06/04/2020   Lab Results  Component Value Date   CA2729 10.4 02/13/2020     REVIEW OF SYSTEMS: Cynthia Vasquez    COVID 19 VACCINATION STATUS: Status post [place] x2, with booster pending   HISTORY OF CURRENT ILLNESS: From the original intake note:  Lanetta herself palpated a left breast mass, with associated dimpling, tenderness, and pinching pain. She underwent bilateral diagnostic mammography with tomography and left breast ultrasonography at [facility] on 11/09/2019 showing: breast density category C; 2.9 cm irregular mass in left breast at 2:30 corresponding to palpable abnormality; three enlarged left axillary lymph nodes.  Accordingly on 11/17/2019 she proceeded to biopsy of the left breast area in question. The pathology from this procedure ([id number]) showed: invasive mammary carcinoma, grade 2, e-cadherin positive. Prognostic indicators significant for: estrogen receptor, 95% positive and progesterone receptor, 95% positive, both with strong staining intensity. Proliferation marker Ki67 at 30%. HER2 negative by immunohistochemistry (1+).  The biopsied lymph node was positive for metastatic carcinoma.  The patient's subsequent history is as detailed below.   PAST MEDICAL HISTORY: Past Medical History:  Diagnosis Date  . Atrial septal defect    repaired age 3  . Cancer ([place]) 01/2020   IDC left breast  . GERD (gastroesophageal reflux disease)   . Glaucoma   . Headache    migraines until age 14- 38, have them 3-4 x year  . History of blood transfusion    with ASD surgery at age 89  . History of kidney stones    passed stones  . Hypertension   . Vitamin D deficiency     PAST SURGICAL HISTORY: Past Surgical History:  Procedure Laterality Date  . ASD REPAIR  age 17  . BREAST LUMPECTOMY WITH RADIOACTIVE SEED AND AXILLARY LYMPH NODE DISSECTION Left 01/12/2020   Procedure: LEFT BREAST LUMPECTOMY WITH RADIOACTIVE SEED AND LEFT AXILLARY LYMPH NODE DISSECTION;  Surgeon: Alphonsa Overall, MD;  Location: [facility];  Service: General;  Laterality: Left;  PEC  BLOCK  . COLONOSCOPY    . PORTACATH PLACEMENT Right 02/02/2020   Procedure: INSERTION PORT-A-CATH WITH ULTRASOUND GUIDANCE;  Surgeon: Alphonsa Overall, MD;  Location: [facility];  Service: General;  Laterality: Right;  . UPPER GI ENDOSCOPY      FAMILY HISTORY: Family History  Problem Relation Age of Onset  . Dementia Mother   . Heart attack Father   . Hypertension Father   . Breast cancer Sister 41  The patient's father died at age 14 from a myocardial infarction.  The patient's mother died at 83 with Alzheimer's disease.  The patient has 2 brothers, 2 sisters.  1 sister developed breast cancer at the age of 72.  The patient's daughter has a history of thyroid cancer.  She has not been genetically tested.  There is no other history of cancer in the family to the patient's knowledge.   GYNECOLOGIC HISTORY:  No LMP recorded (lmp unknown). Patient is postmenopausal. Menarche: 75 years old Age at first live birth: 75 years old [place] P 3 LMP age 46 Contraceptive approximately 2 years, with no complications HRT no  Hysterectomy?  No BSO?  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  Her husband [name] (goes by [employer]") is also a retired Optometrist.  Son [name] 70 lives in [place] and works for a company that processes linnen for hospitals, but he completed a masters in Chief Executive Officer and is looking for a different job; daughter [name] lives in [place] and is a Oncologist; son [name] lives in [place] and runs a business that puts antennaes on cell towers.  The patient has 8 grandchildren.  She attends our [employer].    ADVANCED DIRECTIVES: In the absence of any documents to the contrary the patient's husband is her healthcare power of attorney   HEALTH MAINTENANCE: Social History   Tobacco Use  . Smoking status: Never Smoker  . Smokeless tobacco: Never Used  Vaping  Use  . Vaping Use: Never used  Substance Use Topics  . Alcohol use: No  . Drug use: No     Colonoscopy: "Less than 10 years ago"; [facility]  PAP: Remote  Bone density: Never   No Known Allergies  Current Outpatient Medications  Medication Sig Dispense Refill  . acetaminophen (TYLENOL) 500 MG tablet Take 1 tablet (500 mg total) by mouth every 8 (eight) hours as needed. Take with aleve 220 md 60 tablet 0  . anastrozole (ARIMIDEX) 1 MG tablet Take 1 tablet (1 mg total) by mouth daily. 90 tablet 4  . b complex vitamins tablet Take 1 tablet by mouth 3 (three) times a week.    . calcium gluconate 500 MG tablet Take 1 tablet (500 mg total) by mouth 3 (three) times daily.    . cholecalciferol (VITAMIN D) 1000 UNITS tablet Take 1,000 Units by mouth daily.    . hydrochlorothiazide (HYDRODIURIL) 25 MG tablet Take 1 tablet (25 mg total) by mouth daily. 90 tablet 4  . latanoprost (XALATAN) 0.005 % ophthalmic solution Place 1 drop into both eyes at bedtime.    . lidocaine-prilocaine (EMLA) cream  Apply to affected area once 30 g 3  . loratadine (CLARITIN) 10 MG tablet Take 1 tablet (10 mg total) by mouth daily. 90 tablet 4  . losartan (COZAAR) 50 MG tablet Take 1 tablet (50 mg total) by mouth daily.    . Multiple Vitamins-Minerals (MULTIVITAMIN WITH MINERALS) tablet Take 1 tablet by mouth daily.    . naproxen sodium (ALEVE) 220 MG tablet Take 1 tablet (220 mg total) by mouth every 8 (eight) hours as needed. Take with tylenol 500 mg. 60 tablet 3  . pantoprazole (PROTONIX) 40 MG tablet Take 40 mg by mouth every other day.      No current facility-administered medications for this visit.    OBJECTIVE:   There were no vitals filed for this visit. Wt Readings from Last 3 Encounters:  08/13/20 141 lb 1.6 oz (64 kg)  07/10/20 144 lb 12.8 oz (65.7 kg)  07/02/20 146 lb 12.8 oz (66.6 kg)   There is no height or weight on file to calculate BMI.    ECOG FS:1 - Symptomatic but completely  ambulatory      LAB RESULTS:  CMP     Component Value Date/Time   NA 140 08/13/2020 0958   K 4.0 08/13/2020 0958   CL 104 08/13/2020 0958   CO2 28 08/13/2020 0958   GLUCOSE 91 08/13/2020 0958   BUN 20 08/13/2020 0958   CREATININE 0.83 08/13/2020 0958   CREATININE 1.06 (H) 11/29/2019 0845   CALCIUM 10.1 08/13/2020 0958   PROT 7.1 08/13/2020 0958   ALBUMIN 4.1 08/13/2020 0958   AST 22 08/13/2020 0958   AST 23 11/29/2019 0845   ALT 16 08/13/2020 0958   ALT 17 11/29/2019 0845   ALKPHOS 42 08/13/2020 0958   BILITOT 0.5 08/13/2020 0958   BILITOT 0.6 11/29/2019 0845   GFRNONAA >60 08/13/2020 0958   GFRNONAA 52 (L) 11/29/2019 0845   GFRAA 58 (L) 04/30/2020 1020   GFRAA >60 11/29/2019 0845    No results found for: TOTALPROTELP, ALBUMINELP, A1GS, A2GS, BETS, BETA2SER, GAMS, MSPIKE, SPEI  Lab Results  Component Value Date   WBC 4.6 08/13/2020   NEUTROABS 3.0 08/13/2020   HGB 11.2 (L) 08/13/2020   HCT 34.3 (L) 08/13/2020   MCV 90.0 08/13/2020   PLT 247 08/13/2020    No results found for: LABCA2  No components found for: VVOHYW737  No results for input(s): INR in the last 168 hours.  No results found for: LABCA2  No results found for: TGG269  No results found for: SWN462  No results found for: VOJ500  Lab Results  Component Value Date   CA2729 10.4 02/13/2020    No components found for: HGQUANT  No results found for: CEA1 / No results found for: CEA1   No results found for: AFPTUMOR  No results found for: CHROMOGRNA  No results found for: KPAFRELGTCHN, LAMBDASER, KAPLAMBRATIO (kappa/lambda light chains)  No results found for: HGBA, HGBA2QUANT, HGBFQUANT, HGBSQUAN (Hemoglobinopathy evaluation)   Lab Results  Component Value Date   LDH 230 (H) 06/28/2019    No results found for: IRON, TIBC, IRONPCTSAT (Iron and TIBC)  Lab Results  Component Value Date   FERRITIN 626 (H) 07/03/2019    Urinalysis    Component Value Date/Time   COLORURINE  YELLOW 06/11/2013 2007   APPEARANCEUR CLEAR 06/11/2013 2007   LABSPEC 1.017 06/11/2013 2007   PHURINE 6.5 06/11/2013 2007   GLUCOSEU NEGATIVE 06/11/2013 2007   HGBUR NEGATIVE 06/11/2013 2007   [place] NEGATIVE 06/11/2013  2007   KETONESUR NEGATIVE 06/11/2013 2007   PROTEINUR NEGATIVE 06/11/2013 2007   UROBILINOGEN 0.2 06/11/2013 2007   NITRITE NEGATIVE 06/11/2013 2007   LEUKOCYTESUR NEGATIVE 06/11/2013 2007    STUDIES: MR BRAIN W WO CONTRAST  Result Date: 10/22/2020 CLINICAL DATA:  Dizziness EXAM: MRI HEAD WITHOUT AND WITH CONTRAST TECHNIQUE: Multiplanar, multiecho pulse sequences of the brain and surrounding structures were obtained without and with intravenous contrast. CONTRAST:  15m GADAVIST GADOBUTROL 1 MMOL/ML IV SOLN COMPARISON:  2014 FINDINGS: Brain: There is no acute infarction or intracranial hemorrhage. There is no intracranial mass, mass effect, or edema. There is no hydrocephalus or extra-axial fluid collection. Prominence of the ventricles and sulci reflects mild parenchymal volume loss. Patchy and confluent areas of T2 hyperintensity in the supratentorial and pontine white matter are nonspecific but probably reflect moderate to advanced chronic microvascular ischemic and/or therapy related changes. Chronic right parietal infarct. No abnormal enhancement. Vascular: Major vessel flow voids at the skull base are preserved. Skull and upper cervical spine: Normal marrow signal is preserved. Sinuses/Orbits: Paranasal sinuses are aerated. Orbits are unremarkable. Other: Sella is expanded and partially empty. Mastoid air cells are clear. IMPRESSION: No acute infarction, hemorrhage, or mass.  No abnormal enhancement. Electronically Signed   By: PMacy MisM.D.   On: 10/22/2020 19:29   [place]BREAST LTD UNI LEFT INC AXILLA  Result Date: 10/15/2020 CLINICAL DATA:  Patient presents for evaluation of palpable abnormality within the left axilla. Patient underwent left breast lumpectomy  01/2020. EXAM: DIGITAL DIAGNOSTIC UNILATERAL LEFT MAMMOGRAM WITH TOMOSYNTHESIS AND CAD; ULTRASOUND LEFT BREAST LIMITED TECHNIQUE: Left digital diagnostic mammography and breast tomosynthesis was performed. The images were evaluated with computer-aided detection.; Targeted ultrasound examination of the left breast was performed COMPARISON:  Previous exam(s). ACR Breast Density Category c: The breast tissue is heterogeneously dense, which may obscure small masses. FINDINGS: Within the left axilla there is a partially visualized ankle mass. Postsurgical and radiation changes involving the left breast. No additional masses, calcifications or nonsurgical distortion. On physical exam, a mobile mass is palpated within the left axilla. Targeted ultrasound is performed, showing a 4.0 x 2.7 x 4.2 cm complex collection within the left axilla at the site concern. IMPRESSION: Complex collection/seroma within the left axilla. Post lumpectomy and radiation changes left breast. RECOMMENDATION: Ultrasound-guided aspiration of the seroma under the left axilla per surgical recommendation. Annual diagnostic mammography 11/2020. I have discussed the findings and recommendations with the patient. If applicable, a reminder letter will be sent to the patient regarding the next appointment. BI-RADS CATEGORY  2: Benign. Electronically Signed   By: DLovey NewcomerM.D.   On: 10/15/2020 16:13   MM DIAG BREAST TOMO UNI LEFT  Result Date: 10/15/2020 CLINICAL DATA:  Patient presents for evaluation of palpable abnormality within the left axilla. Patient underwent left breast lumpectomy 01/2020. EXAM: DIGITAL DIAGNOSTIC UNILATERAL LEFT MAMMOGRAM WITH TOMOSYNTHESIS AND CAD; ULTRASOUND LEFT BREAST LIMITED TECHNIQUE: Left digital diagnostic mammography and breast tomosynthesis was performed. The images were evaluated with computer-aided detection.; Targeted ultrasound examination of the left breast was performed COMPARISON:  Previous exam(s). ACR  Breast Density Category c: The breast tissue is heterogeneously dense, which may obscure small masses. FINDINGS: Within the left axilla there is a partially visualized ankle mass. Postsurgical and radiation changes involving the left breast. No additional masses, calcifications or nonsurgical distortion. On physical exam, a mobile mass is palpated within the left axilla. Targeted ultrasound is performed, showing a 4.0 x 2.7 x 4.2 cm complex  collection within the left axilla at the site concern. IMPRESSION: Complex collection/seroma within the left axilla. Post lumpectomy and radiation changes left breast. RECOMMENDATION: Ultrasound-guided aspiration of the seroma under the left axilla per surgical recommendation. Annual diagnostic mammography 11/2020. I have discussed the findings and recommendations with the patient. If applicable, a reminder letter will be sent to the patient regarding the next appointment. BI-RADS CATEGORY  2: Benign. Electronically Signed   By: Lovey Newcomer M.D.   On: 10/15/2020 16:13   US BREAST ASPIRATION LEFT  Result Date: 10/15/2020 CLINICAL DATA:  Patient with left axillary seroma. EXAM: ULTRASOUND GUIDED LEFT BREAST CYST ASPIRATION COMPARISON:  Previous exams. PROCEDURE: The patient and I discussed the procedure of ultrasound-guided aspiration including benefits and alternatives. We discussed the high likelihood of a successful procedure. We discussed the risks of the procedure including infection, bleeding, tissue injury, and inadequate sampling. Informed written consent was given. The usual time out protocol was performed immediately prior to the procedure. Using sterile technique, 1% lidocaine, under direct ultrasound visualization, needle aspiration of left axillary seroma was performed. Approximately 30 cc of thin fluid was aspirated. IMPRESSION: Ultrasound-guided aspiration of left axillary seroma. No apparent complications. RECOMMENDATIONS: Annual diagnostic mammography 11/2020.  Electronically Signed   By: Lovey Newcomer M.D.   On: 10/15/2020 16:15     ELIGIBLE FOR AVAILABLE RESEARCH PROTOCOL: AET  ASSESSMENT: 75 y.o. [place] woman status post left breast upper outer quadrant biopsy 11/17/2019 for a clinical T2 N1, stage IIA invasive ductal carcinoma, grade 2, estrogen and progesterone receptor positive, HER-2 not amplified, with an MIB-1 of 30%.  (1) status post left lumpectomy and axillary lymph node dissection 01/12/2020 for a pT2 pN2, stage IIIA invasive ductal carcinoma, grade 3, with negative margins  (a) a total of 14 axillary lymph nodes removed, 8 positive, with extracapsular extension  (2) adjuvant chemotherapy consisting of doxorubicin and cyclophosphamide in dose dense fashion x4 started 02/13/2020, completed 04/10/2020, followed by weekly paclitaxel x12 started 04/30/2020, last dose 06/18/2020  (a) echocardiogram 02/08/2020 showed an ejection fraction in the 65/70%  (b) final 2 cycles of doxorubicin/cyclophosphamide given 21 days apart and 18% dose reduced  (c) final 4 doses of paclitaxel omitted secondary to fatigue and neuropathy  (3) adjuvant radiation 07/18/2020 through 09/05/2020 Site Technique Total Dose (Gy) Dose per Fx (Gy) Completed Fx Beam Energies  Breast, Left: Breast_Lt 3D 50.4/50.4 1.8 28/28 6X, 10X  Breast, Left: Breast_Lt_SCLV 3D 50.4/50.4 1.8 28/28 6X, 10X  Breast, Left: Breast_Lt_Bst 3D 10/10 2 5/5 6X, 10X   (4) antiestrogens to start at the completion of local treatment   PLAN: I was unable to connect her with Cynthia Vasquez by phone today for virtual visit.  I am going to go ahead and schedule her for an in person visit in about 3 months.   Virgie Dad. Marcin Holte, MD 11/05/2020 12:58 PM Medical Oncology and Hematology [facility] [place], [place] [address] Tel. [phone]    Fax. [phone]   I, Wilburn Mylar, am acting as scribe for Dr. Virgie Dad. Doloris Servantes.  I, Lurline Del MD, have reviewed the  above documentation for accuracy and completeness, and I agree with the above.   *Total Encounter Time as defined by the Centers for Medicare and Medicaid Services includes, in addition to the face-to-face time of a patient visit (documented in the note above) non-face-to-face time: obtaining and reviewing outside history, ordering and reviewing medications, tests or procedures, care coordination (communications with other health care professionals or  caregivers) and documentation in the medical record.

## 2020-11-05 ENCOUNTER — Inpatient Hospital Stay: Payer: Medicare HMO | Attending: Oncology | Admitting: Oncology

## 2020-11-05 DIAGNOSIS — C50412 Malignant neoplasm of upper-outer quadrant of left female breast: Secondary | ICD-10-CM

## 2020-11-05 DIAGNOSIS — Z170 Estrogen receptor positive status [ER+]: Secondary | ICD-10-CM

## 2020-11-07 ENCOUNTER — Telehealth: Payer: Self-pay | Admitting: Oncology

## 2020-11-07 NOTE — Telephone Encounter (Signed)
Scheduled appts per 4/5 los. Pt aware.

## 2020-11-11 ENCOUNTER — Telehealth: Payer: Self-pay | Admitting: Oncology

## 2020-11-11 NOTE — Telephone Encounter (Signed)
Called patient regarding 04/06 scheduled message, left a voicemail.

## 2020-11-18 ENCOUNTER — Other Ambulatory Visit: Payer: Self-pay | Admitting: Oncology

## 2020-11-18 DIAGNOSIS — Z853 Personal history of malignant neoplasm of breast: Secondary | ICD-10-CM

## 2020-11-21 ENCOUNTER — Other Ambulatory Visit: Payer: Self-pay

## 2020-11-21 ENCOUNTER — Ambulatory Visit
Admission: RE | Admit: 2020-11-21 | Discharge: 2020-11-21 | Disposition: A | Discharge status: Home / Self Care | Payer: Medicare HMO | Source: Ambulatory Visit | Attending: Oncology | Admitting: Oncology

## 2020-11-21 ENCOUNTER — Ambulatory Visit
Admission: RE | Admit: 2020-11-21 | Discharge: 2020-11-21 | Disposition: A | Discharge status: Home / Self Care | Payer: Medicare HMO | Source: Ambulatory Visit | Attending: Family Medicine | Admitting: Family Medicine

## 2020-11-21 ENCOUNTER — Other Ambulatory Visit: Payer: Self-pay | Admitting: Oncology

## 2020-11-21 DIAGNOSIS — N6489 Other specified disorders of breast: Secondary | ICD-10-CM | POA: Diagnosis not present

## 2020-11-21 DIAGNOSIS — R2232 Localized swelling, mass and lump, left upper limb: Secondary | ICD-10-CM

## 2020-11-21 DIAGNOSIS — Z853 Personal history of malignant neoplasm of breast: Secondary | ICD-10-CM | POA: Diagnosis not present

## 2020-11-21 DIAGNOSIS — R921 Mammographic calcification found on diagnostic imaging of breast: Secondary | ICD-10-CM | POA: Diagnosis not present

## 2020-11-21 HISTORY — DX: Personal history of irradiation: Z92.3

## 2020-11-21 HISTORY — DX: Personal history of antineoplastic chemotherapy: Z92.21

## 2020-11-22 ENCOUNTER — Telehealth: Payer: Self-pay

## 2020-11-22 NOTE — Telephone Encounter (Signed)
Pt called and LVM stating she had MM 4/21, and was called to schedule bx d/t results. Pt wants to make sure this is something Dr Jana Hakim agrees with before going. This LPN attempted to call pt, no answer. LVM explaining to pt Dr Jana Hakim would want her to proceed if it was recommended by the radiologist. Also advised pt to RTC to our office if she has any other questions/concerns.

## 2020-11-25 ENCOUNTER — Ambulatory Visit
Admission: RE | Admit: 2020-11-25 | Discharge: 2020-11-25 | Disposition: A | Discharge status: Home / Self Care | Payer: Medicare HMO | Source: Ambulatory Visit | Attending: Oncology | Admitting: Oncology

## 2020-11-25 ENCOUNTER — Other Ambulatory Visit: Payer: Self-pay

## 2020-11-25 DIAGNOSIS — Z170 Estrogen receptor positive status [ER+]: Secondary | ICD-10-CM

## 2020-11-25 NOTE — Progress Notes (Signed)
  Radiation Oncology         [phone] ________________________________  Name: Cynthia Vasquez MRN: [id number]  Date of Service: 11/25/2020  DOB: 18-Oct-1945  Post Treatment Telephone Note  Diagnosis:  Stage IIB,pT2N2aM0 grade3, ER/PR positive invasive ductal carcinoma of the left breast   Interval Since Last Radiation:  12 weeks   07/18/2020 through 09/05/2020 Site Technique Total Dose (Gy) Dose per Fx (Gy) Completed Fx Beam Energies  Breast, Left: Breast_Lt 3D 50.4/50.4 1.8 28/28 6X, 10X  Breast, Left: Breast_Lt_SCLV 3D 50.4/50.4 1.8 28/28 6X, 10X  Breast, Left: Breast_Lt_Bst 3D 10/10 2 5/5 6X, 10X     Narrative:  The patient was contacted today for routine follow-up. During treatment she did very well with radiotherapy and did not have significant desquamation. She reports she is actually having a biopsy after undergoing her diagnostic mammogram and a new 3 mm grouping of calcifications were seen. She's had an ultrasound that was negative for disease but showed a seroma in the left axilla. A biopsy is scheduled for 11/29/20.  Impression/Plan: 1. Stage IIB,pT2N2aM0 grade3, ER/PR positive invasive ductal carcinoma of the left breast . The patient has been doing well since completion of radiotherapy. We discussed that we would be happy to continue to follow her as needed, but she will also continue to follow up with Dr. Jana Hakim in medical oncology. She was counseled on skin care as well as measures to avoid sun exposure to this area.  2. Right breast calcifications. The patient will have a biopsy this Friday. We will follow up on the results when available.      Carola Rhine, PAC

## 2020-11-29 ENCOUNTER — Other Ambulatory Visit: Payer: Self-pay

## 2020-11-29 ENCOUNTER — Ambulatory Visit
Admission: RE | Admit: 2020-11-29 | Discharge: 2020-11-29 | Disposition: A | Discharge status: Home / Self Care | Payer: Medicare HMO | Source: Ambulatory Visit | Attending: Oncology | Admitting: Oncology

## 2020-11-29 DIAGNOSIS — N6011 Diffuse cystic mastopathy of right breast: Secondary | ICD-10-CM | POA: Diagnosis not present

## 2020-11-29 DIAGNOSIS — R921 Mammographic calcification found on diagnostic imaging of breast: Secondary | ICD-10-CM

## 2020-12-01 DIAGNOSIS — U071 COVID-19: Secondary | ICD-10-CM | POA: Diagnosis not present

## 2020-12-03 ENCOUNTER — Telehealth: Payer: Self-pay | Admitting: Radiation Oncology

## 2020-12-03 NOTE — Telephone Encounter (Signed)
I called the patient to confirm her knowledge of her benign biopsy results. We will be happy to see her as needed moving forward.

## 2021-01-01 DIAGNOSIS — U071 COVID-19: Secondary | ICD-10-CM | POA: Diagnosis not present

## 2021-01-23 NOTE — Progress Notes (Signed)
[place]  [phone] [phone]     ID: Cynthia Vasquez DOB: 24-Jan-1946  MR#: [id number]  WUX#:[id number]  Patient Care Team: Sueanne Margarita, DO as PCP - General (Internal Medicine) Rockwell Germany, RN as Oncology Nurse Navigator Mauro Kaufmann, RN as Oncology Nurse Navigator [name], Virgie Dad, MD as Consulting Physician (Oncology) Alphonsa Overall, MD as Consulting Physician (General Surgery) Kyung Rudd, MD as Consulting Physician (Radiation Oncology) Wilford Corner, MD as Consulting Physician (Gastroenterology) Chauncey Cruel, MD OTHER MD:   CHIEF COMPLAINT: Estrogen receptor positive breast cancer  CURRENT TREATMENT: anastrozole   INTERVAL HISTORY: Cynthia Vasquez returns today for follow-up of her estrogen receptor positive breast cancer.    Since her last visit, she underwent bilateral diagnostic mammography with tomography and left axilla ultrasonography at [facility] on 11/21/2020 showing: breast density category C; new 3 mm group of round and punctate calcifications in lower-outer right breast; no other suspicious findings seen in either breast; seroma in left axilla.  She proceeded to biopsy of the right breast area in question on 11/29/2020. Pathology [id number]) showed fibrocystic change with calcifications.  Because of a very low hemoglobin and symptoms she received a unit of packed red cells on 05/14/2020.  Her hemoglobin continues to recover:  Results for Cynthia, Vasquez (MRN [id number]) as of 01/27/2021 09:45  Ref. Range 06/18/2020 08:50 07/02/2020 08:40 08/13/2020 09:58 01/27/2021 08:57  Hemoglobin Latest Ref Range: 12.0 - 15.0 g/dL 9.2 (L) 9.5 (L) 11.2 (L) 12.0    Lab Results  Component Value Date   CA2729 10.4 02/13/2020    REVIEW OF SYSTEMS: Cynthia Vasquez feels she is "getting back to normal".  She has more energy.  She walks 20 to 30 minutes at least 6 days a week.  She takes care of her grandchildren which she greatly  enjoys.  Her hair has come back currently, thick, black and white, very different from her baseline, and she has a couple of areas at the temples which are a little bit on the thin side and she is wearing a hat as a result.  She has occasional shooting pains in the left breast.  She has a swelling in the left armpit that she says tends to come and go but does not otherwise bother her.  A detailed review of systems today was otherwise stable   COVID 19 VACCINATION STATUS: Status post Pfizer x2, with booster pending   HISTORY OF CURRENT ILLNESS: From the original intake note:  Cynthia Vasquez herself palpated a left breast mass, with associated dimpling, tenderness, and pinching pain. She underwent bilateral diagnostic mammography with tomography and left breast ultrasonography at [facility] on 11/09/2019 showing: breast density category C; 2.9 cm irregular mass in left breast at 2:30 corresponding to palpable abnormality; three enlarged left axillary lymph nodes.  Accordingly on 11/17/2019 she proceeded to biopsy of the left breast area in question. The pathology from this procedure ([id number]) showed: invasive mammary carcinoma, grade 2, e-cadherin positive. Prognostic indicators significant for: estrogen receptor, 95% positive and progesterone receptor, 95% positive, both with strong staining intensity. Proliferation marker Ki67 at 30%. HER2 negative by immunohistochemistry (1+).  The biopsied lymph node was positive for metastatic carcinoma.  The patient's subsequent history is as detailed below.   PAST MEDICAL HISTORY: Past Medical History:  Diagnosis Date   Atrial septal defect    repaired age 30   Cancer ([place]) 01/2020   IDC left breast   GERD (gastroesophageal reflux disease)  Glaucoma    Headache    migraines until age 72- 70, have them 3-4 x year   History of blood transfusion    with ASD surgery at age 74   History of kidney stones    passed stones   Hypertension    Personal  history of chemotherapy    Personal history of radiation therapy    Vitamin D deficiency     PAST SURGICAL HISTORY: Past Surgical History:  Procedure Laterality Date   ASD REPAIR     age 89   BREAST LUMPECTOMY     BREAST LUMPECTOMY WITH RADIOACTIVE SEED AND AXILLARY LYMPH NODE DISSECTION Left 01/12/2020   Procedure: LEFT BREAST LUMPECTOMY WITH RADIOACTIVE SEED AND LEFT AXILLARY LYMPH NODE DISSECTION;  Surgeon: Alphonsa Overall, MD;  Location: [facility];  Service: General;  Laterality: Left;  PEC BLOCK   COLONOSCOPY     PORTACATH PLACEMENT Right 02/02/2020   Procedure: INSERTION PORT-A-CATH WITH ULTRASOUND GUIDANCE;  Surgeon: Alphonsa Overall, MD;  Location: [facility];  Service: General;  Laterality: Right;   UPPER GI ENDOSCOPY      FAMILY HISTORY: Family History  Problem Relation Age of Onset   Dementia Mother    Heart attack Father    Hypertension Father    Breast cancer Sister 75  The patient's father died at age 64 from a myocardial infarction.  The patient's mother died at 3 with Alzheimer's disease.  The patient has 2 brothers, 2 sisters.  1 sister developed breast cancer at the age of 63.  The patient's daughter has a history of thyroid cancer.  She has not been genetically tested.  There is no other history of cancer in the family to the patient's knowledge.   GYNECOLOGIC HISTORY:  No LMP recorded (lmp unknown). Patient is postmenopausal. Menarche: 75 years old Age at first live birth: 75 years old [place] P 3 LMP age 61 Contraceptive approximately 2 years, with no complications HRT no  Hysterectomy?  No BSO?  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  Her husband Cynthia Vasquez (goes by [employer]") is also a retired Optometrist.  Son Cynthia Vasquez 9 lives in [place] and works for a company that processes linnen for hospitals, but he completed a masters in Chief Executive Officer and is looking for a different job;  daughter Cynthia Vasquez lives in [place] and is a Oncologist; son Cynthia Vasquez lives in [place] and runs a business that puts antennaes on cell towers.  The patient has 8 grandchildren.  She attends our [employer].    ADVANCED DIRECTIVES: In the absence of any documents to the contrary the patient's husband is her healthcare power of attorney   HEALTH MAINTENANCE: Social History   Tobacco Use   Smoking status: Never   Smokeless tobacco: Never  Vaping Use   Vaping Use: Never used  Substance Use Topics   Alcohol use: No   Drug use: No     Colonoscopy: "Less than 10 years ago"; [facility]  PAP: Remote  Bone density: Never   No Known Allergies  Current Outpatient Medications  Medication Sig Dispense Refill   calcium gluconate 500 MG tablet Take 1 tablet (500 mg total) by mouth 3 (three) times daily.     acetaminophen (TYLENOL) 500 MG tablet Take 1 tablet (500 mg total) by mouth every 8 (eight) hours as needed. Take with aleve 220 md 60 tablet 0  anastrozole (ARIMIDEX) 1 MG tablet Take 1 tablet (1 mg total) by mouth daily. 90 tablet 4   b complex vitamins tablet Take 1 tablet by mouth 3 (three) times a week.     calcium gluconate 500 MG tablet Take 1 tablet (500 mg total) by mouth 3 (three) times daily.     cholecalciferol (VITAMIN D) 1000 UNITS tablet Take 1,000 Units by mouth daily.     hydrochlorothiazide (HYDRODIURIL) 25 MG tablet Take 1 tablet (25 mg total) by mouth daily. 90 tablet 4   latanoprost (XALATAN) 0.005 % ophthalmic solution Place 1 drop into both eyes at bedtime.     loratadine (CLARITIN) 10 MG tablet Take 1 tablet (10 mg total) by mouth daily. 90 tablet 4   losartan (COZAAR) 50 MG tablet Take 1 tablet (50 mg total) by mouth daily.     Multiple Vitamins-Minerals (MULTIVITAMIN WITH MINERALS) tablet Take 1 tablet by mouth daily.     naproxen sodium (ALEVE) 220 MG tablet Take 1 tablet (220 mg total) by mouth every 8 (eight) hours as needed. Take with  tylenol 500 mg. 60 tablet 3   pantoprazole (PROTONIX) 40 MG tablet Take 40 mg by mouth every other day.      No current facility-administered medications for this visit.    OBJECTIVE: White woman in no acute distress  Vitals:   01/27/21 0913  BP: 137/72  Pulse: 82  Resp: 18  Temp: 98.4 F (36.9 C)  SpO2: 97%   Wt Readings from Last 3 Encounters:  01/27/21 137 lb 1.6 oz (62.2 kg)  08/13/20 141 lb 1.6 oz (64 kg)  07/10/20 144 lb 12.8 oz (65.7 kg)   Body mass index is 27.69 kg/m.    ECOG FS:1 - Symptomatic but completely ambulatory  Sclerae unicteric, EOMs intact Wearing a mask No cervical or supraclavicular adenopathy Lungs no rales or rhonchi Heart regular rate and rhythm Abd soft, nontender, positive bowel sounds MSK no focal spinal tenderness, no upper extremity lymphedema Neuro: nonfocal, well oriented, appropriate affect Breasts: The left axilla does show a firm movable nontender nonerythematous mass measuring approximately 1-1/2 cm consistent with seroma or scar tissue   LAB RESULTS:  CMP     Component Value Date/Time   NA 141 01/27/2021 0857   K 4.5 01/27/2021 0857   CL 103 01/27/2021 0857   CO2 28 01/27/2021 0857   GLUCOSE 94 01/27/2021 0857   BUN 26 (H) 01/27/2021 0857   CREATININE 1.10 (H) 01/27/2021 0857   CREATININE 1.06 (H) 11/29/2019 0845   CALCIUM 10.2 01/27/2021 0857   PROT 7.5 01/27/2021 0857   ALBUMIN 4.3 01/27/2021 0857   AST 24 01/27/2021 0857   AST 23 11/29/2019 0845   ALT 14 01/27/2021 0857   ALT 17 11/29/2019 0845   ALKPHOS 40 01/27/2021 0857   BILITOT 0.7 01/27/2021 0857   BILITOT 0.6 11/29/2019 0845   GFRNONAA 53 (L) 01/27/2021 0857   GFRNONAA 52 (L) 11/29/2019 0845   GFRAA 58 (L) 04/30/2020 1020   GFRAA >60 11/29/2019 0845    No results found for: TOTALPROTELP, ALBUMINELP, A1GS, A2GS, BETS, BETA2SER, GAMS, MSPIKE, SPEI  Lab Results  Component Value Date   WBC 4.6 01/27/2021   NEUTROABS 2.6 01/27/2021   HGB 12.0  01/27/2021   HCT 35.9 (L) 01/27/2021   MCV 88.9 01/27/2021   PLT 274 01/27/2021    No results found for: LABCA2  No components found for: GYIRSW546  No results for input(s): INR in the last 168  hours.  No results found for: LABCA2  No results found for: WPY099  No results found for: IPJ825  No results found for: KNL976  Lab Results  Component Value Date   CA2729 10.4 02/13/2020    No components found for: HGQUANT  No results found for: CEA1 / No results found for: CEA1   No results found for: AFPTUMOR  No results found for: CHROMOGRNA  No results found for: KPAFRELGTCHN, LAMBDASER, KAPLAMBRATIO (kappa/lambda light chains)  No results found for: HGBA, HGBA2QUANT, HGBFQUANT, HGBSQUAN (Hemoglobinopathy evaluation)   Lab Results  Component Value Date   LDH 230 (H) 06/28/2019    No results found for: IRON, TIBC, IRONPCTSAT (Iron and TIBC)  Lab Results  Component Value Date   FERRITIN 626 (H) 07/03/2019    Urinalysis    Component Value Date/Time   COLORURINE YELLOW 06/11/2013 2007   APPEARANCEUR CLEAR 06/11/2013 2007   LABSPEC 1.017 06/11/2013 2007   PHURINE 6.5 06/11/2013 2007   GLUCOSEU NEGATIVE 06/11/2013 2007   HGBUR NEGATIVE 06/11/2013 2007   [place] NEGATIVE 06/11/2013 2007   [place] NEGATIVE 06/11/2013 2007   PROTEINUR NEGATIVE 06/11/2013 2007   UROBILINOGEN 0.2 06/11/2013 2007   NITRITE NEGATIVE 06/11/2013 2007   LEUKOCYTESUR NEGATIVE 06/11/2013 2007    STUDIES: No results found.   ELIGIBLE FOR AVAILABLE RESEARCH PROTOCOL: AET  ASSESSMENT: 75 y.o. [place] woman status post left breast upper outer quadrant biopsy 11/17/2019 for a clinical T2 N1, stage IIA invasive ductal carcinoma, grade 2, estrogen and progesterone receptor positive, HER-2 not amplified, with an MIB-1 of 30%.  (1) status post left lumpectomy and axillary lymph node dissection 01/12/2020 for a pT2 pN2, stage IIIA invasive ductal carcinoma, grade 3, with negative  margins  (a) a total of 14 axillary lymph nodes removed, 8 positive, with extracapsular extension  (2) adjuvant chemotherapy consisting of doxorubicin and cyclophosphamide in dose dense fashion x4 started 02/13/2020, completed 04/10/2020, followed by weekly paclitaxel x12 started 04/30/2020, last dose 06/18/2020  (a) echocardiogram 02/08/2020 showed an ejection fraction in the 65/70%  (b) final 2 cycles of doxorubicin/cyclophosphamide given 21 days apart and 18% dose reduced  (c) final 4 doses of paclitaxel omitted secondary to fatigue and neuropathy  (3) adjuvant radiation 07/18/2020 through 09/05/2020 Site Technique Total Dose (Gy) Dose per Fx (Gy) Completed Fx Beam Energies  Breast, Left: Breast_Lt 3D 50.4/50.4 1.8 28/28 6X, 10X  Breast, Left: Breast_Lt_SCLV 3D 50.4/50.4 1.8 28/28 6X, 10X  Breast, Left: Breast_Lt_Bst 3D 10/10 2 5/5 6X, 10X   (4) anastrozole started January 2022  (A) bone density November 2022   PLAN: Dulcey is now a year out from definitive surgery for her breast cancer with no evidence of disease recurrence.  This is very favorable.  She is tolerating anastrozole remarkably well and the plan is to continue that a minimum of 5 years.  I have asked the surgeons to remove her port at the next available opportunity  She still has peripheral neuropathy.  I think she may qualify for our PEA study and I will ask our study nurse to confirm  Otherwise I am setting Kimiko up for a bone density in November and to see me in December.  She knows to call for any other issue admittable before then  Total encounter time 25 minutes.Sarajane Jews C. Lenda Baratta, MD 01/27/2021 9:47 AM Medical Oncology and Hematology [facility] [place],  [address] Tel. [phone]    Fax. [phone]   I, Wilburn Mylar,  am acting as scribe for Dr. Sarajane Jews C. Anahla Bevis.  I, Lurline Del MD, have reviewed the above documentation for accuracy and completeness,  and I agree with the above.   *Total Encounter Time as defined by the Centers for Medicare and Medicaid Services includes, in addition to the face-to-face time of a patient visit (documented in the note above) non-face-to-face time: obtaining and reviewing outside history, ordering and reviewing medications, tests or procedures, care coordination (communications with other health care professionals or caregivers) and documentation in the medical record.

## 2021-01-24 DIAGNOSIS — H401131 Primary open-angle glaucoma, bilateral, mild stage: Secondary | ICD-10-CM | POA: Diagnosis not present

## 2021-01-27 ENCOUNTER — Other Ambulatory Visit: Payer: Self-pay

## 2021-01-27 ENCOUNTER — Inpatient Hospital Stay: Payer: Medicare HMO | Attending: Oncology

## 2021-01-27 ENCOUNTER — Inpatient Hospital Stay (HOSPITAL_BASED_OUTPATIENT_CLINIC_OR_DEPARTMENT_OTHER): Payer: Medicare HMO | Admitting: Oncology

## 2021-01-27 ENCOUNTER — Telehealth: Payer: Self-pay | Admitting: Oncology

## 2021-01-27 VITALS — BP 137/72 | HR 82 | Temp 98.4°F | Resp 18 | Ht 59.0 in | Wt 137.1 lb

## 2021-01-27 DIAGNOSIS — Z170 Estrogen receptor positive status [ER+]: Secondary | ICD-10-CM

## 2021-01-27 DIAGNOSIS — C773 Secondary and unspecified malignant neoplasm of axilla and upper limb lymph nodes: Secondary | ICD-10-CM | POA: Insufficient documentation

## 2021-01-27 DIAGNOSIS — R922 Inconclusive mammogram: Secondary | ICD-10-CM | POA: Insufficient documentation

## 2021-01-27 DIAGNOSIS — Z8249 Family history of ischemic heart disease and other diseases of the circulatory system: Secondary | ICD-10-CM | POA: Diagnosis not present

## 2021-01-27 DIAGNOSIS — Z79899 Other long term (current) drug therapy: Secondary | ICD-10-CM | POA: Insufficient documentation

## 2021-01-27 DIAGNOSIS — C50412 Malignant neoplasm of upper-outer quadrant of left female breast: Secondary | ICD-10-CM | POA: Diagnosis not present

## 2021-01-27 DIAGNOSIS — Z9221 Personal history of antineoplastic chemotherapy: Secondary | ICD-10-CM | POA: Insufficient documentation

## 2021-01-27 DIAGNOSIS — K219 Gastro-esophageal reflux disease without esophagitis: Secondary | ICD-10-CM | POA: Insufficient documentation

## 2021-01-27 DIAGNOSIS — Z923 Personal history of irradiation: Secondary | ICD-10-CM | POA: Diagnosis not present

## 2021-01-27 DIAGNOSIS — Z803 Family history of malignant neoplasm of breast: Secondary | ICD-10-CM | POA: Diagnosis not present

## 2021-01-27 DIAGNOSIS — Z818 Family history of other mental and behavioral disorders: Secondary | ICD-10-CM | POA: Diagnosis not present

## 2021-01-27 DIAGNOSIS — G629 Polyneuropathy, unspecified: Secondary | ICD-10-CM | POA: Insufficient documentation

## 2021-01-27 DIAGNOSIS — I10 Essential (primary) hypertension: Secondary | ICD-10-CM | POA: Diagnosis not present

## 2021-01-27 DIAGNOSIS — Z79811 Long term (current) use of aromatase inhibitors: Secondary | ICD-10-CM | POA: Diagnosis not present

## 2021-01-27 DIAGNOSIS — R590 Localized enlarged lymph nodes: Secondary | ICD-10-CM | POA: Diagnosis not present

## 2021-01-27 DIAGNOSIS — Z87442 Personal history of urinary calculi: Secondary | ICD-10-CM | POA: Insufficient documentation

## 2021-01-27 LAB — CBC WITH DIFFERENTIAL/PLATELET
Abs Immature Granulocytes: 0.03 10*3/uL (ref 0.00–0.07)
Basophils Absolute: 0 10*3/uL (ref 0.0–0.1)
Basophils Relative: 1 %
Eosinophils Absolute: 0.2 10*3/uL (ref 0.0–0.5)
Eosinophils Relative: 4 %
HCT: 35.9 % — ABNORMAL LOW (ref 36.0–46.0)
Hemoglobin: 12 g/dL (ref 12.0–15.0)
Immature Granulocytes: 1 %
Lymphocytes Relative: 26 %
Lymphs Abs: 1.2 10*3/uL (ref 0.7–4.0)
MCH: 29.7 pg (ref 26.0–34.0)
MCHC: 33.4 g/dL (ref 30.0–36.0)
MCV: 88.9 fL (ref 80.0–100.0)
Monocytes Absolute: 0.5 10*3/uL (ref 0.1–1.0)
Monocytes Relative: 12 %
Neutro Abs: 2.6 10*3/uL (ref 1.7–7.7)
Neutrophils Relative %: 56 %
Platelets: 274 10*3/uL (ref 150–400)
RBC: 4.04 MIL/uL (ref 3.87–5.11)
RDW: 13.4 % (ref 11.5–15.5)
WBC: 4.6 10*3/uL (ref 4.0–10.5)
nRBC: 0 % (ref 0.0–0.2)

## 2021-01-27 LAB — FERRITIN: Ferritin: 309 ng/mL — ABNORMAL HIGH (ref 11–307)

## 2021-01-27 LAB — IRON AND TIBC
Iron: 106 ug/dL (ref 41–142)
Saturation Ratios: 33 % (ref 21–57)
TIBC: 325 ug/dL (ref 236–444)
UIBC: 219 ug/dL (ref 120–384)

## 2021-01-27 LAB — RETICULOCYTES
Immature Retic Fract: 7.9 % (ref 2.3–15.9)
RBC.: 4.02 MIL/uL (ref 3.87–5.11)
Retic Count, Absolute: 43.4 10*3/uL (ref 19.0–186.0)
Retic Ct Pct: 1.1 % (ref 0.4–3.1)

## 2021-01-27 LAB — COMPREHENSIVE METABOLIC PANEL
ALT: 14 U/L (ref 0–44)
AST: 24 U/L (ref 15–41)
Albumin: 4.3 g/dL (ref 3.5–5.0)
Alkaline Phosphatase: 40 U/L (ref 38–126)
Anion gap: 10 (ref 5–15)
BUN: 26 mg/dL — ABNORMAL HIGH (ref 8–23)
CO2: 28 mmol/L (ref 22–32)
Calcium: 10.2 mg/dL (ref 8.9–10.3)
Chloride: 103 mmol/L (ref 98–111)
Creatinine, Ser: 1.1 mg/dL — ABNORMAL HIGH (ref 0.44–1.00)
GFR, Estimated: 53 mL/min — ABNORMAL LOW (ref >60)
Glucose, Bld: 94 mg/dL (ref 70–99)
Potassium: 4.5 mmol/L (ref 3.5–5.1)
Sodium: 141 mmol/L (ref 135–145)
Total Bilirubin: 0.7 mg/dL (ref 0.3–1.2)
Total Protein: 7.5 g/dL (ref 6.5–8.1)

## 2021-01-27 NOTE — Telephone Encounter (Signed)
Scheduled appointment per 06/27 los. Left message.

## 2021-01-28 ENCOUNTER — Telehealth: Payer: Self-pay | Admitting: Emergency Medicine

## 2021-01-28 NOTE — Telephone Encounter (Signed)
[facility]U-[place]-2102 - TREATMENT OF ESTABLISHED CHEMOTHERAPY-INDUCED NEUROPATHY WITH N-PALMITOYLETHANOLAMIDE, A CANNABIMIMETIC NUTRACEUTICAL: A RANDOMIZED DOUBLE-BLIND PHASE II PILOT TRIAL  This patient was referred to this study by Dr. Jana Hakim.  Patient did not answer when called to introduce the study.  Left voicemail requesting return call.  Clabe Seal Clinical Research Coordinator I  01/28/21  10:41 AM

## 2021-01-29 ENCOUNTER — Telehealth: Payer: Self-pay | Admitting: Emergency Medicine

## 2021-01-29 NOTE — Telephone Encounter (Signed)
[facility]U-[place]-2102 - TREATMENT OF ESTABLISHED CHEMOTHERAPY-INDUCED NEUROPATHY WITH N-PALMITOYLETHANOLAMIDE, A CANNABIMIMETIC NUTRACEUTICAL: A RANDOMIZED DOUBLE-BLIND PHASE II PILOT TRIAL  Patient returned call concerning this study.  Explained the study rationale, procedures, and and assessments to the patient.  Discussed the informed consent document with the patient and explained the informed consent process.  It was explained that this study involves randomization and blinding with use of placebo. The patient was securely emailed the informed consent document at her request.  Patient reports neuropathy symptoms of 7 on scale of 0-10.    Patient states she will reach back out once she has had time to review the consent documents and made a decision of whether she would like to participate.  She states she will be going on a vacation in the next two weeks which may delay her call.  The patient was advised to call with any questions or concerns.  She was thanked for her time.  Clabe Seal Clinical Research Coordinator I  01/29/21 3:54 PM

## 2021-01-31 DIAGNOSIS — U071 COVID-19: Secondary | ICD-10-CM | POA: Diagnosis not present

## 2021-02-01 IMAGING — CT CT CHEST W/ CM
2 of 3 series · 14 of 36 positions shown, 17 images · IV contrast (omnipaque)
Comparison: Chest x-rays from 2821, MRI abdomen from April 29, 2018

CLINICAL DATA: Breast cancer, locally advanced breast cancer
evaluate for metastases.

EXAM:
CT CHEST WITH CONTRAST
TECHNIQUE: Multidetector CT imaging of the chest was performed during
intravenous contrast administration.
CONTRAST:  75mL OMNIPAQUE IOHEXOL 300 MG/ML  SOLN

[Series 2: axial st · axial · 0.85mm/px · z∈[-280,-30]mm · 11 of 147 slices shown, 14 images]
[im 11/147  mediastinal]
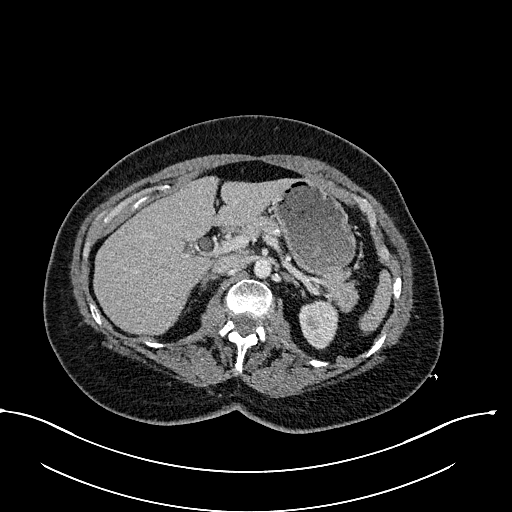
[im 11/147  lung]
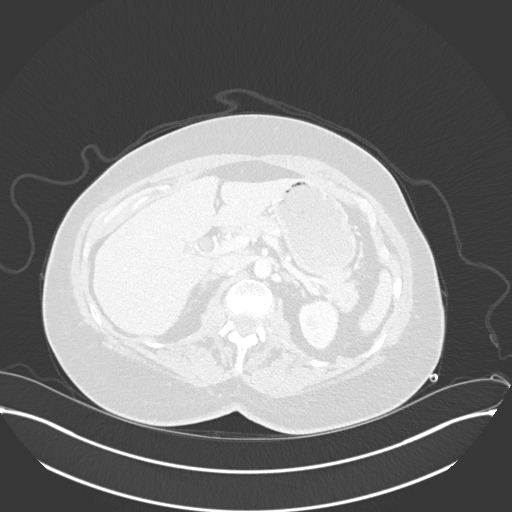
[im 22/147  lung]
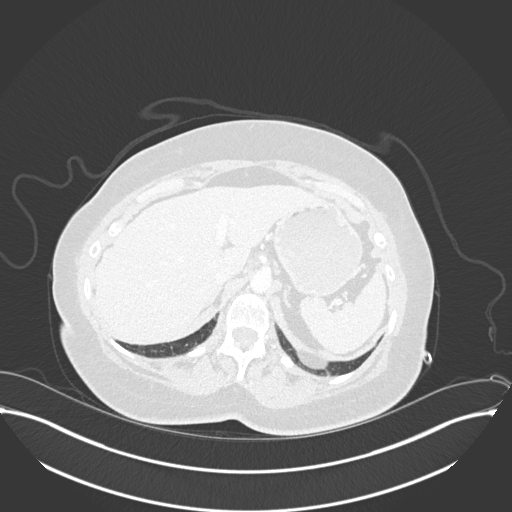
[im 33/147  lung]
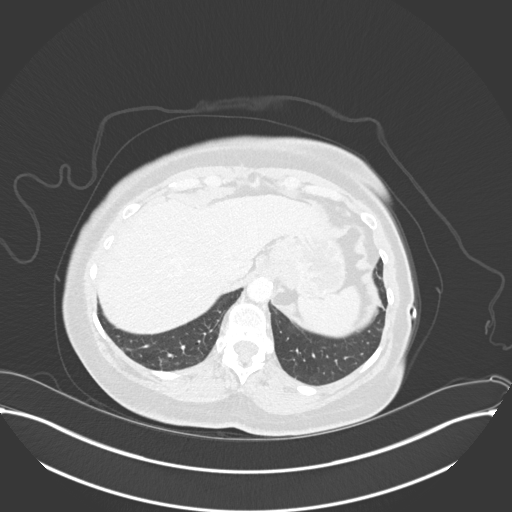
[im 49/147  lung]
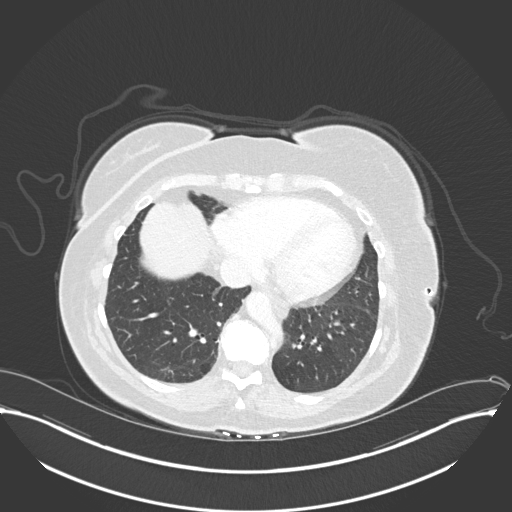
[im 60/147  mediastinal]
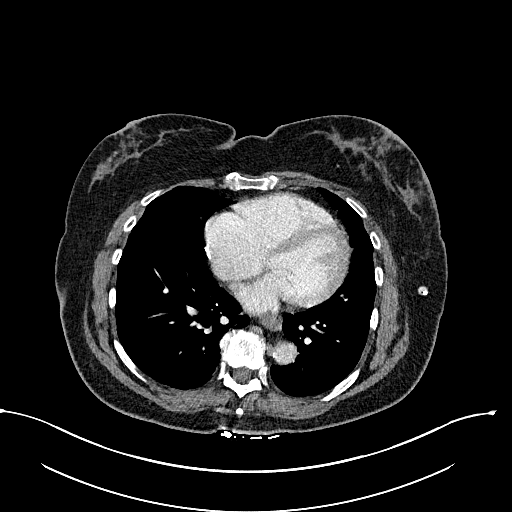
[im 60/147  lung]
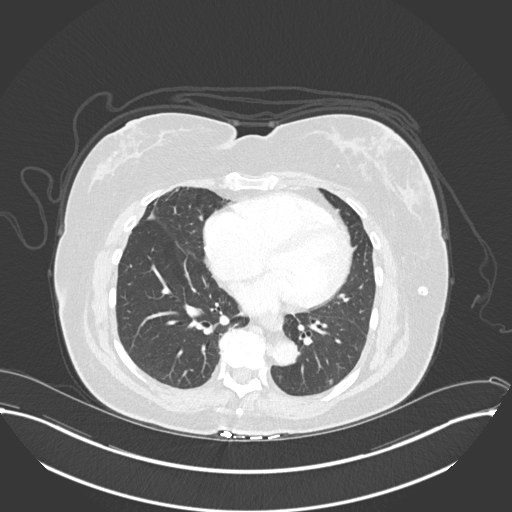
[im 76/147  lung]
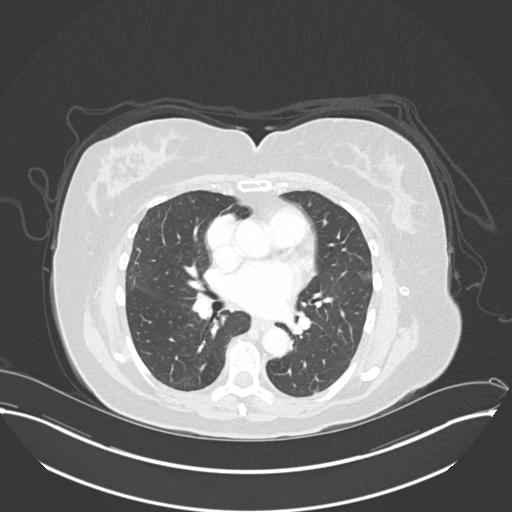
[im 87/147  lung]
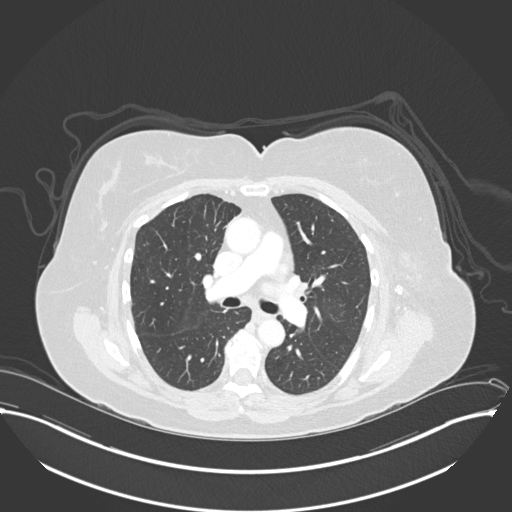
[im 98/147  lung]
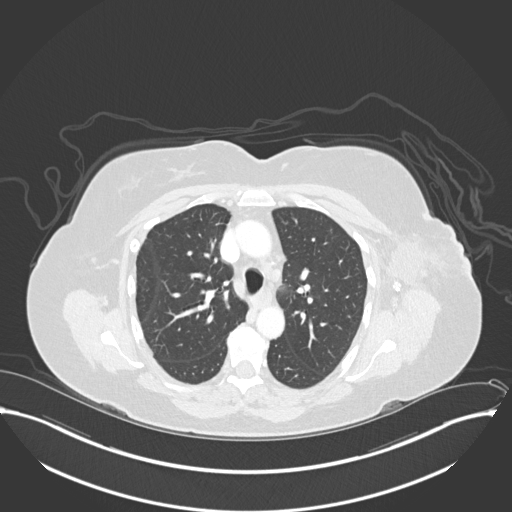
[im 114/147  mediastinal]
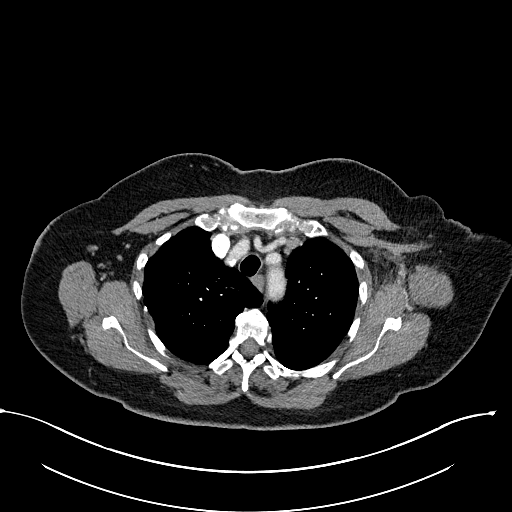
[im 114/147  lung]
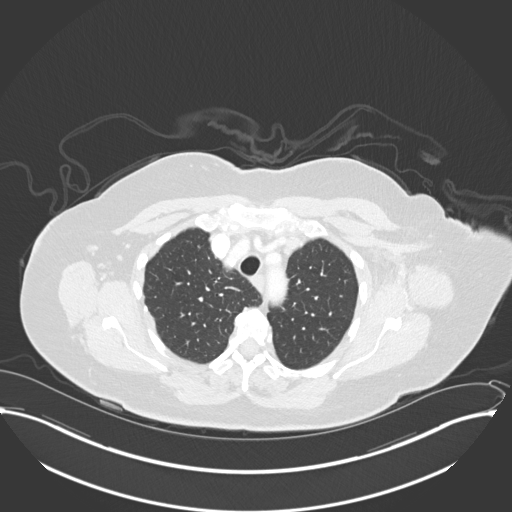
[im 125/147  lung]
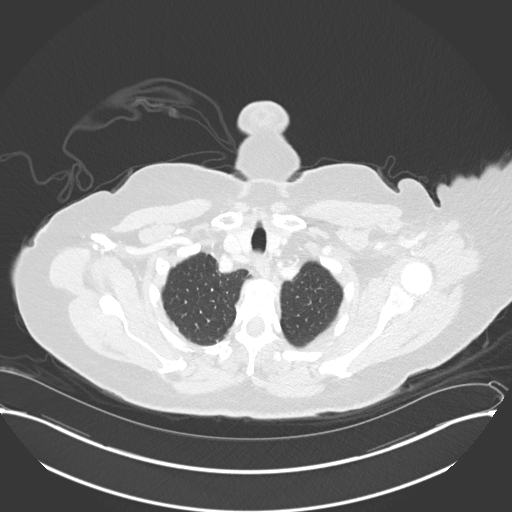
[im 136/147  lung]
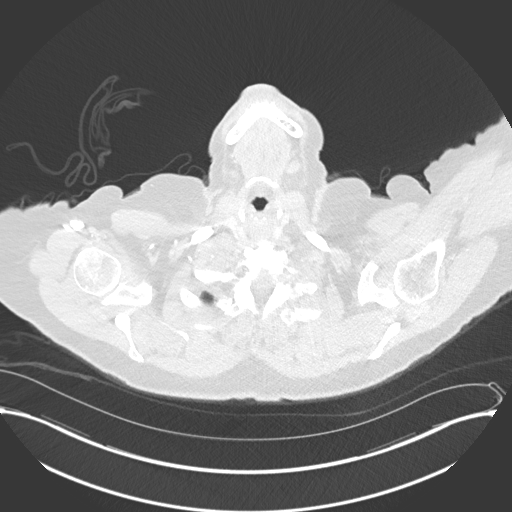

[Series 6: coronal · coronal · 0.61mm/px · 3 of 137 slices shown]
[im 28/137  lung]
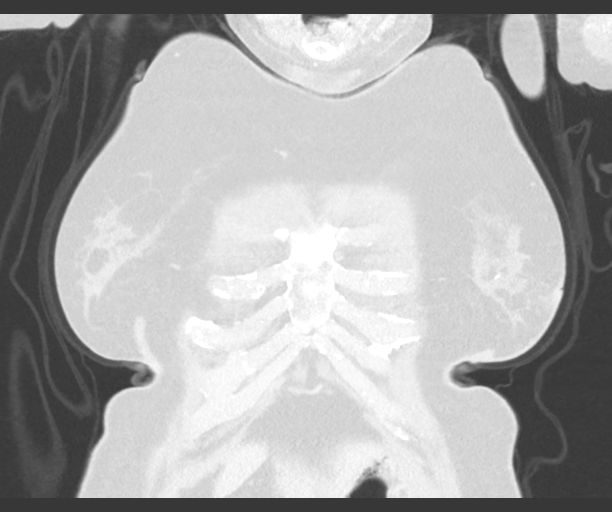
[im 55/137  lung]
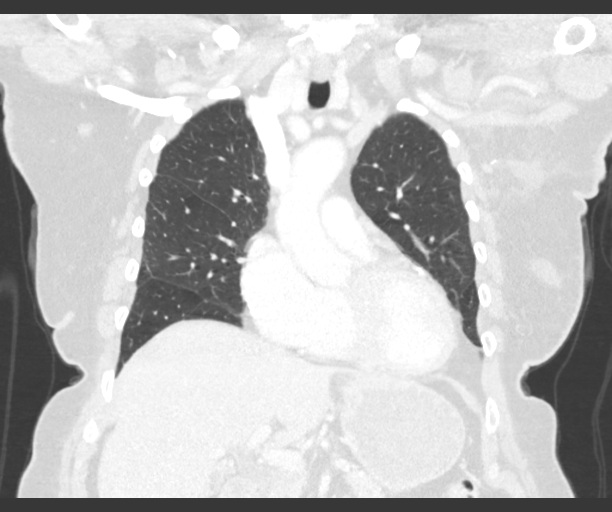
[im 82/137  lung]
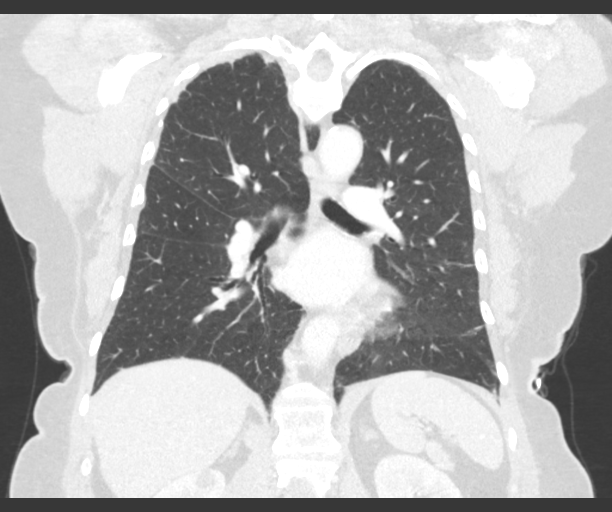

[14 of 36 positions shown; findings below may reference images not displayed]

FINDINGS: Cardiovascular: Calcific and noncalcific atheromatous plaque of the
thoracic aorta. Mild tortuosity. No aneurysmal dilation of the
thoracic aorta. Heart size mildly enlarged without pericardial
effusion. Central pulmonary vasculature on venous phase assessment
is unremarkable.

There is increased density along the posterior margin of the RIGHT
atrium and there is RIGHT heart enlargement in this patient with
history of prior ASD repair.

Mediastinum/Nodes: No thoracic inlet adenopathy. No subpectoral
adenopathy. No internal mammary adenopathy. No mediastinal
adenopathy. No hilar adenopathy.

Scattered small lymph nodes in the RIGHT axilla with fatty hila.

Small lymph node adjacent to LEFT axillary lymph node dissection
changes measures 8 mm (image 48, series 2)

Lungs/Pleura: No consolidation. No pleural effusion. 5 x 4 mm LEFT
lower lobe pulmonary nodule (image 87, series 5)

Upper Abdomen: Incidental imaging of upper abdominal contents with
partial imaging of previously characterized LEFT renal cyst arising
from anterior LEFT kidney. No acute upper abdominal process.

Musculoskeletal: Postoperative changes in the LEFT axilla with area
of fluid density adjacent to surgical drain measuring 3.6 x 3.1 cm
without peripheral enhancement. Six Hounsfield unit density.

Mild pleural irregularity along the RIGHT chest associated with some
calcification and mild deformity of the RIGHT posterolateral fourth
rib presumably associated with prior thoracotomy. Spinal
degenerative changes. No acute or destructive bone process.
IMPRESSION: 1. Postoperative changes in the LEFT axilla with area of fluid
density adjacent to surgical drain measuring 3.6 x 3.1 cm without
peripheral enhancement. Likely postoperative seroma. Drain is at the
posterior margin of this collection potentially outside of this
area. Small lymph node adjacent to postoperative changes suspected
and described on image 48 of series 2, less than a cm. Attention on
follow-up.
2. 5 x 4 mm LEFT lower lobe pulmonary nodule. Nonspecific, short
interval follow-up may be helpful.
3. Mild pleural irregularity along the RIGHT chest associated with
some calcification and mild deformity of the RIGHT posterolateral
fourth rib presumably associated with prior thoracotomy in this
patient with history of prior ASD repair.
4. Aortic atherosclerosis.

Aortic Atherosclerosis ([id number]).

## 2021-02-08 ENCOUNTER — Encounter (HOSPITAL_COMMUNITY): Payer: Self-pay

## 2021-02-25 ENCOUNTER — Telehealth: Payer: Self-pay | Admitting: Emergency Medicine

## 2021-02-25 NOTE — Telephone Encounter (Signed)
[facility]-[place]-2102 - TREATMENT OF ESTABLISHED CHEMOTHERAPY-INDUCED NEUROPATHY WITH N-PALMITOYLETHANOLAMIDE, A CANNABIMIMETIC NUTRACEUTICAL: A RANDOMIZED DOUBLE-BLIND PHASE II PILOT TRIAL  02/25/21  3:38 PM: Called to follow up concerning interest in this research study.  Patient did not answer, left voicemail requesting return call.  Clabe Seal Clinical Research Coordinator I  02/25/21  3:38 PM

## 2021-02-26 ENCOUNTER — Other Ambulatory Visit: Payer: Self-pay | Admitting: *Deleted

## 2021-02-26 DIAGNOSIS — C50412 Malignant neoplasm of upper-outer quadrant of left female breast: Secondary | ICD-10-CM

## 2021-02-26 DIAGNOSIS — Z170 Estrogen receptor positive status [ER+]: Secondary | ICD-10-CM

## 2021-03-03 ENCOUNTER — Telehealth: Payer: Self-pay

## 2021-03-03 DIAGNOSIS — U071 COVID-19: Secondary | ICD-10-CM | POA: Diagnosis not present

## 2021-03-03 NOTE — Telephone Encounter (Signed)
Pt called and states she would like to r/s port removal appt as her husbands needs surgery and he would be her transportation. Pt wises to r/s a few days after. Attempted to cal IR to r/s, LVM for Tiffany.

## 2021-03-24 ENCOUNTER — Encounter: Payer: Self-pay | Admitting: Oncology

## 2021-03-25 ENCOUNTER — Ambulatory Visit (HOSPITAL_COMMUNITY): Payer: Medicare HMO

## 2021-03-25 ENCOUNTER — Other Ambulatory Visit (HOSPITAL_COMMUNITY): Payer: Medicare HMO

## 2021-04-01 ENCOUNTER — Telehealth: Payer: Self-pay | Admitting: Emergency Medicine

## 2021-04-01 NOTE — Telephone Encounter (Signed)
[facility]-[place]-2102 - TREATMENT OF ESTABLISHED CHEMOTHERAPY-INDUCED NEUROPATHY WITH N-PALMITOYLETHANOLAMIDE, A CANNABIMIMETIC NUTRACEUTICAL: A RANDOMIZED DOUBLE-BLIND PHASE II PILOT TRIAL  04/01/21 3:29 PM  Called to check interest in this research study.  No answer, left voicemail with this coordinator's contact information and requesting return call if she is interested or has any questions.  Clabe Seal Clinical Research Coordinator I  04/01/21  3:29 PM

## 2021-04-03 DIAGNOSIS — U071 COVID-19: Secondary | ICD-10-CM | POA: Diagnosis not present

## 2021-04-04 ENCOUNTER — Other Ambulatory Visit: Payer: Medicare HMO

## 2021-04-08 ENCOUNTER — Other Ambulatory Visit (HOSPITAL_COMMUNITY): Payer: Medicare HMO

## 2021-04-09 ENCOUNTER — Other Ambulatory Visit: Payer: Self-pay | Admitting: Student

## 2021-04-10 ENCOUNTER — Other Ambulatory Visit: Payer: Self-pay

## 2021-04-10 ENCOUNTER — Encounter (HOSPITAL_COMMUNITY): Payer: Self-pay

## 2021-04-10 ENCOUNTER — Ambulatory Visit (HOSPITAL_COMMUNITY)
Admission: RE | Admit: 2021-04-10 | Discharge: 2021-04-10 | Disposition: A | Discharge status: Home / Self Care | Payer: Medicare HMO | Source: Ambulatory Visit | Attending: Oncology | Admitting: Oncology

## 2021-04-10 DIAGNOSIS — Z853 Personal history of malignant neoplasm of breast: Secondary | ICD-10-CM | POA: Diagnosis not present

## 2021-04-10 DIAGNOSIS — C50912 Malignant neoplasm of unspecified site of left female breast: Secondary | ICD-10-CM | POA: Insufficient documentation

## 2021-04-10 DIAGNOSIS — Z79899 Other long term (current) drug therapy: Secondary | ICD-10-CM | POA: Diagnosis not present

## 2021-04-10 DIAGNOSIS — C50919 Malignant neoplasm of unspecified site of unspecified female breast: Secondary | ICD-10-CM | POA: Diagnosis not present

## 2021-04-10 DIAGNOSIS — Z452 Encounter for adjustment and management of vascular access device: Secondary | ICD-10-CM | POA: Insufficient documentation

## 2021-04-10 DIAGNOSIS — C50412 Malignant neoplasm of upper-outer quadrant of left female breast: Secondary | ICD-10-CM

## 2021-04-10 HISTORY — PX: IR REMOVAL TUN ACCESS W/ PORT W/O FL MOD SED: IMG2290

## 2021-04-10 MED ORDER — MIDAZOLAM HCL 2 MG/2ML IJ SOLN
INTRAMUSCULAR | Status: DC | PRN
Start: 2021-04-10 — End: 2021-04-11
  Administered 2021-04-10: 1 mg via INTRAVENOUS

## 2021-04-10 MED ORDER — LIDOCAINE-EPINEPHRINE 1 %-1:100000 IJ SOLN
INTRAMUSCULAR | Status: DC | PRN
  Administered 2021-04-10: 10 mL via INTRADERMAL

## 2021-04-10 MED ORDER — MIDAZOLAM HCL 2 MG/2ML IJ SOLN
INTRAMUSCULAR | Status: AC
  Filled 2021-04-10: qty 2

## 2021-04-10 MED ORDER — FENTANYL CITRATE (PF) 100 MCG/2ML IJ SOLN
INTRAMUSCULAR | Status: AC
  Filled 2021-04-10: qty 2

## 2021-04-10 MED ORDER — LIDOCAINE HCL 1 % IJ SOLN
INTRAMUSCULAR | Status: AC
  Filled 2021-04-10: qty 20

## 2021-04-10 MED ORDER — LIDOCAINE-EPINEPHRINE 1 %-1:100000 IJ SOLN
INTRAMUSCULAR | Status: AC
  Administered 2021-04-10: 2 mL via SUBCUTANEOUS
  Filled 2021-04-10: qty 1

## 2021-04-10 MED ORDER — SODIUM CHLORIDE 0.9 % IV SOLN: INTRAVENOUS | Status: DC

## 2021-04-10 MED ORDER — FENTANYL CITRATE (PF) 100 MCG/2ML IJ SOLN
INTRAMUSCULAR | Status: DC | PRN
  Administered 2021-04-10: 50 ug via INTRAVENOUS

## 2021-04-10 NOTE — H&P (Signed)
Chief Complaint: Patient was seen in consultation today for breast cancer  Referring Physician(s): Chauncey Cruel  Supervising Physician: Michaelle Birks  Patient Status: [place]  History of Present Illness: Cynthia Vasquez is a 75 y.o. female with past medical history of stage IIA invasive ductal carcinoma.  She is s/p left lumpectomy and axillary lymph node dissection with adjuvant chemotherapy via Port-A-Cath placed by Dr. Lucia Gaskins 02/02/20. She has completed therapy without signs of recurrence in >1 year.  She is referred to IR today for Port-A-Cath removal.   Cynthia Vasquez presents to [facility] today for Port-A-Cath removal.  She has been NPO.  She does not take blood thinners. She denies concerns or complaints. States that she has notice slow healing/recovery in the skin/tissue of her left breast since radiation completion in March.  No drainage or new changes.  Reports ongoing improvement.  Discussed the possibility of slow healing to wound created from [facility] removal today. She is aware of the goals of the procedure and is agreeable to proceed.   Past Medical History:  Diagnosis Date   Atrial septal defect    repaired age 65   Cancer ([place]) 01/2020   IDC left breast   GERD (gastroesophageal reflux disease)    Glaucoma    Headache    migraines until age 64- 32, have them 3-4 x year   History of blood transfusion    with ASD surgery at age 2   History of kidney stones    passed stones   Hypertension    Personal history of chemotherapy    Personal history of radiation therapy    Vitamin D deficiency     Past Surgical History:  Procedure Laterality Date   ASD REPAIR     age 45   BREAST LUMPECTOMY     BREAST LUMPECTOMY WITH RADIOACTIVE SEED AND AXILLARY LYMPH NODE DISSECTION Left 01/12/2020   Procedure: LEFT BREAST LUMPECTOMY WITH RADIOACTIVE SEED AND LEFT AXILLARY LYMPH NODE DISSECTION;  Surgeon: Alphonsa Overall, MD;  Location: [place];  Service: General;  Laterality:  Left;  PEC BLOCK   COLONOSCOPY     PORTACATH PLACEMENT Right 02/02/2020   Procedure: INSERTION PORT-A-CATH WITH ULTRASOUND GUIDANCE;  Surgeon: Alphonsa Overall, MD;  Location: [place];  Service: General;  Laterality: Right;   UPPER GI ENDOSCOPY      Allergies: Patient has no known allergies.  Medications: Prior to Admission medications   Medication Sig Start Date End Date Taking? Authorizing Provider  b complex vitamins tablet Take 1 tablet by mouth 3 (three) times a week.   Yes [provider]  calcium gluconate 500 MG tablet Take 1 tablet (500 mg total) by mouth 3 (three) times daily. 02/08/20  Yes Magrinat, Virgie Dad, MD  cholecalciferol (VITAMIN D) 1000 UNITS tablet Take 1,000 Units by mouth daily.   Yes [provider]  hydrochlorothiazide (HYDRODIURIL) 25 MG tablet Take 1 tablet (25 mg total) by mouth daily. 08/13/20  Yes Magrinat, Virgie Dad, MD  latanoprost (XALATAN) 0.005 % ophthalmic solution Place 1 drop into both eyes at bedtime. 07/15/19  Yes [provider]  losartan (COZAAR) 50 MG tablet Take 1 tablet (50 mg total) by mouth daily. 07/02/20  Yes Magrinat, Virgie Dad, MD  Multiple Vitamins-Minerals (MULTIVITAMIN WITH MINERALS) tablet Take 1 tablet by mouth daily.   Yes [provider]  Turmeric 500 MG TABS Take 1,500 mg by mouth daily.   Yes [provider]  acetaminophen (TYLENOL) 500 MG  tablet Take 1 tablet (500 mg total) by mouth every 8 (eight) hours as needed. Take with aleve 220 md 02/15/20   Magrinat, Virgie Dad, MD  anastrozole (ARIMIDEX) 1 MG tablet Take 1 tablet (1 mg total) by mouth daily. 08/13/20   Magrinat, Virgie Dad, MD  calcium gluconate 500 MG tablet Take 1 tablet (500 mg total) by mouth 3 (three) times daily. 01/27/21   Magrinat, Virgie Dad, MD  loratadine (CLARITIN) 10 MG tablet Take 1 tablet (10 mg total) by mouth daily. 07/02/20   Magrinat, Virgie Dad, MD  naproxen sodium (ALEVE) 220 MG tablet Take 1 tablet (220 mg  total) by mouth every 8 (eight) hours as needed. Take with tylenol 500 mg. 02/15/20   Magrinat, Virgie Dad, MD  pantoprazole (PROTONIX) 40 MG tablet Take 40 mg by mouth every other day.    [provider]     Family History  Problem Relation Age of Onset   Dementia Mother    Heart attack Father    Hypertension Father    Breast cancer Sister 54    Social History   Socioeconomic History   Marital status: Married    Spouse name: [name]   Number of children: 3   Years of education: 16   Highest education level: Not on file  Occupational History   Occupation: Pharmacist, hospital    Comment: retired  Tobacco Use   Smoking status: Never   Smokeless tobacco: Never  Vaping Use   Vaping Use: Never used  Substance and Sexual Activity   Alcohol use: No   Drug use: No   Sexual activity: Not on file  Other Topics Concern   Not on file  Social History Narrative   Lives at home with husband   Caffeine use- green tea 1-2 glasses a day   Social Determinants of Radio broadcast assistant Strain: Not on file  Food Insecurity: Not on file  Transportation Needs: Not on file  Physical Activity: Not on file  Stress: Not on file  Social Connections: Not on file     Review of Systems: A 12 point ROS discussed and pertinent positives are indicated in the HPI above.  All other systems are negative.  Review of Systems  Constitutional:  Negative for fatigue and fever.  Respiratory:  Negative for cough and shortness of breath.   Cardiovascular:  Negative for chest pain.  Gastrointestinal:  Negative for abdominal pain.  Musculoskeletal:  Negative for back pain.  Psychiatric/Behavioral:  Negative for behavioral problems and confusion.    Vital Signs: BP 106/61   Pulse 63   Temp 98.4 F (36.9 C) (Oral)   Resp (!) 2   Ht '4\' 11"'$  (1.499 m)   Wt 133 lb (60.3 kg)   LMP  (LMP Unknown)   SpO2 99%   BMI 26.86 kg/m   Physical Exam Vitals and nursing note reviewed.  Constitutional:       General: She is not in acute distress.    Appearance: Normal appearance. She is not ill-appearing.  HENT:     Mouth/Throat:     Mouth: Mucous membranes are moist.     Pharynx: Oropharynx is clear.  Cardiovascular:     Rate and Rhythm: Normal rate and regular rhythm.  Pulmonary:     Effort: Pulmonary effort is normal. No respiratory distress.     Breath sounds: Normal breath sounds.  Abdominal:     General: Abdomen is flat. There is no distension.     Palpations:  Abdomen is soft.  Skin:    General: Skin is warm and dry.  Neurological:     General: No focal deficit present.     Mental Status: She is alert and oriented to person, place, and time. Mental status is at baseline.  Psychiatric:        Mood and Affect: Mood normal.        Behavior: Behavior normal.        Thought Content: Thought content normal.        Judgment: Judgment normal.     MD Evaluation Airway: WNL Heart: WNL Abdomen: WNL Chest/ Lungs: WNL ASA  Classification: 3 Mallampati/Airway Score: Two   Imaging: No results found.  Labs:  CBC: Recent Labs    06/18/20 0850 07/02/20 0840 08/13/20 0958 01/27/21 0857  WBC 3.5* 9.1 4.6 4.6  HGB 9.2* 9.5* 11.2* 12.0  HCT 28.6* 28.8* 34.3* 35.9*  PLT 278 298 247 274    COAGS: No results for input(s): INR, APTT in the last 8760 hours.  BMP: Recent Labs    04/10/20 1125 04/30/20 1020 05/07/20 0820 06/18/20 0850 07/02/20 0840 08/13/20 0958 01/27/21 0857  NA 139 140   < > 141 139 140 141  K 4.0 3.8   < > 3.9 3.7 4.0 4.5  CL 105 103   < > 108 105 104 103  CO2 28 29   < > '24 25 28 28  '$ GLUCOSE 83 88   < > 116* 117* 91 94  BUN 18 19   < > '17 19 20 '$ 26*  CALCIUM 9.9 10.0   < > 9.4 9.6 10.1 10.2  CREATININE 0.85 1.09*   < > 0.91 0.87 0.83 1.10*  GFRNONAA >60 50*   < > >60 >60 >60 53*  GFRAA >60 58*  --   --   --   --   --    < > = values in this interval not displayed.    LIVER FUNCTION TESTS: Recent Labs    06/18/20 0850 07/02/20 0840  08/13/20 0958 01/27/21 0857  BILITOT 0.6 0.5 0.5 0.7  AST '23 26 22 24  '$ ALT '17 15 16 14  '$ ALKPHOS 36* 40 42 40  PROT 6.8 6.6 7.1 7.5  ALBUMIN 4.1 3.9 4.1 4.3    TUMOR MARKERS: No results for input(s): AFPTM, CEA, CA199, CHROMGRNA in the last 8760 hours.  Assessment and Plan: Patient with past medical history of invasive ductal carcinoma of the left breast presents at the completion of treatment without signs of disease recurrence.  IR consulted for port-A-Cath removal at the request of Dr. Jana Hakim.  Her Port has not been flushed since last use in March, however she denies concerns or complaints related to [place] since placement.  Case reviewed by Dr. Maryelizabeth Kaufmann who approves patient for procedure.  Patient presents today in their usual state of health.  She has been NPO and is not currently on blood thinners.   Risks and benefits of image guided port-a-catheter placement was discussed with the patient including, but not limited to bleeding, infection, pneumothorax, or fibrin sheath development and need for additional procedures.  All of the patient's questions were answered, patient is agreeable to proceed. Consent signed and in chart.  Thank you for this interesting consult.  I greatly enjoyed meeting Cynthia Vasquez and look forward to participating in their care.  A copy of this report was sent to the requesting provider on this date.  Electronically Signed: Docia Barrier, PA  04/10/2021, 11:18 AM   I spent a total of  30 Minutes   in face to face in clinical consultation, greater than 50% of which was counseling/coordinating care for left breast cancer.

## 2021-04-10 NOTE — Procedures (Signed)
Vascular and Interventional Radiology Procedure Note  Patient: Cynthia Vasquez DOB: 05/01/1946 Medical Record Number: [id number] Note Date/Time: 04/10/21 12:11 PM   Performing Physician: Michaelle Birks, MD Assistant(s): None  Diagnosis: Breast cancer. Completion of Rx.  Procedure: PORT REMOVAL  Anesthesia: Conscious Sedation Complications: None Estimated Blood Loss: Minimal Specimens:  None  Findings:  Successful removal of a right-sided venous port. Primary incision closure. Dermabond at skin.  See detailed procedure note with images in PACS. The patient tolerated the procedure well without incident or complication and was returned to Recovery in stable condition.    Michaelle Birks, MD [facility]   Pager. [place]

## 2021-05-03 DIAGNOSIS — U071 COVID-19: Secondary | ICD-10-CM | POA: Diagnosis not present

## 2021-05-27 DIAGNOSIS — H401131 Primary open-angle glaucoma, bilateral, mild stage: Secondary | ICD-10-CM | POA: Diagnosis not present

## 2021-06-03 DIAGNOSIS — U071 COVID-19: Secondary | ICD-10-CM | POA: Diagnosis not present

## 2021-07-03 DIAGNOSIS — U071 COVID-19: Secondary | ICD-10-CM | POA: Diagnosis not present

## 2021-07-06 NOTE — Progress Notes (Signed)
[facility]  [phone] [phone]    ID: Cynthia Vasquez DOB: Aug 17, 1945  MR#: [id number]  QGB#:[id number]  Patient Care Team: Sueanne Margarita, DO as PCP - General (Internal Medicine) Rockwell Germany, RN as Oncology Nurse Navigator Mauro Kaufmann, RN as Oncology Nurse Navigator Tonyia Marschall, Virgie Dad, MD as Consulting Physician (Oncology) Alphonsa Overall, MD as Consulting Physician (General Surgery) Kyung Rudd, MD as Consulting Physician (Radiation Oncology) Wilford Corner, MD as Consulting Physician (Gastroenterology) Chauncey Cruel, MD OTHER MD:   CHIEF COMPLAINT: Estrogen receptor positive breast cancer  CURRENT TREATMENT: to start anastrozole   INTERVAL HISTORY: Cynthia Vasquez returns today for follow-up of her estrogen receptor positive breast cancer.    She was prescribed anastrozole in late 09/2020.  However today she tells me she does not recognize that drug, never picked it up.  Her most recent mammogram was 11/21/2020 at the breast center, showing breast density category C, leading to biopsy of an area of the right breast lower outer quadrant which showed only fibrocystic change, concordant.  Ultrasound of the left breast 11/21/2020 confirmed a seroma  Since her last visit, she has not undergone any additional studies. A bone density screening was ordered to be done in November, but this does not appear to have been scheduled.  Of note, she did have her port removed on 04/10/2021.  She was contacted by our research nurse regarding our new clinical neuropathy study. She did not reach back out with a final decision but today tells me as she is not interested in participating because there is a placebo-controlled.  REVIEW OF SYSTEMS: Lulla is not exercising regularly.  She takes her dog for a walk once a day, but that is just 1 block.  She does have Silver sneakers but is not in a gym.  She takes a nap most days then goes to bed about midnight.   Gets out of bed around 6:30 in the morning.  She says she has trouble falling asleep because her "brain is turning".  A detailed review of systems today was otherwise stable.   COVID 19 VACCINATION STATUS: Had COVID 2020; status post [place] x2, as of December 2075  HISTORY OF CURRENT ILLNESS: From the original intake note:  T75ha herself palpated a left breast mass, with associated dimpling, tenderness, and pinching pain. She underwent bilateral diagnostic mammography with tomography and left breast ultrasonography at [facility] on 11/09/2019 showing: breast density category C; 2.9 cm irregular mass in left breast at 2:30 corresponding to palpable abnormality; three enlarged left axillary lymph nodes.  Accordingly on 11/17/2019 she proceeded to biopsy of the left breast area in question. The pathology from this procedure ([id number]) showed: invasive mammary carcinoma, grade 2, e-cadherin positive. Prognostic indicators significant for: estrogen receptor, 95% positive and progesterone receptor, 95% positive, both with strong staining intensity. Proliferation marker Ki67 at 30%. HER2 negative by immunohistochemistry (1+).  The biopsied lymph node was positive for metastatic carcinoma.  The patient's subsequent history is as detailed 75below.   PAST MEDICAL HISTORY: Past Medical History:  Diagnosis Date   Atrial septal defect    repaired age 75   Cancer ([place]) 01/2020   IDC left breast   GERD (gastroesophageal reflux disease)    Glaucoma    Headache    migraines until age 75- 69, have them 3-4 x year   History of blood transfusion    with ASD surgery at age 75   History of kidney stones  passed stones   Hypertension    Personal history of chemotherapy    Personal history of radiation therapy    Vitamin D deficiency     PAST SURGICAL HISTORY: Past Surgical History:  Procedure Laterality Date   ASD REPAIR     age 75   BREAST LUMPECTOMY     BREAST LUMPECTOMY WITH  RADIOACTIVE SEED AND AXILLARY LYMPH NODE DISSECTION Left 01/12/2020   Procedure: LEFT BREAST LUMPECTOMY WITH RADIOACTIVE SEED AND LEFT AXILLARY LYMPH NODE DISSECTION;  Surgeon: Alphonsa Overall, MD;  Location: [facility];  Service: General;  Laterality: Left;  PEC BLOCK   COLONOSCOPY     IR REMOVAL TUN ACCESS W/ PORT W/O FL MOD SED  04/10/2021   PORTACATH PLACEMENT Right 02/02/2020   Procedure: INSERTION PORT-A-CATH WITH ULTRASOUND GUIDANCE;  Surgeon: Alphonsa Overall, MD;  Location: [facility];  Service: General;  Laterality: Right;   UPPER GI ENDOSCOPY      FAMILY HISTORY: Family History  Problem Relation Age of Onset   Dementia Mother    Heart attack Father    Hypertension Father    Breast cancer Sister 19  The patient's father died at age 71 from a myocardial infarction.  The patient's mother died at 71 with Alzheimer's disease.  The patient has 2 brothers, 2 sisters.  1 sister developed breast cancer at the age of 47.  The patient's daughter has a history of thyroid cancer.  She has not been genetically tested.  There is no other history of cancer in the family to the patient's knowledge.   GYNECOLOGIC HISTORY:  No LMP recorded (lmp unknown). Patient is postmenopausal. Menarche: 75 years old Age at first live birth: 75 years old Ross P 3 LMP age 58 Contraceptive approximately 2 years, with no complications HRT no  Hysterectomy?  No BSO?  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  She is now retired.  Her husband [name] (goes by [employer]") is also a retired Optometrist.  Son [name] 43 lives in [place] and works for a company that processes linnen for hospitals, but he completed a masters in Chief Executive Officer and is looking for a different job; daughter [name] lives in [place] and is a Oncologist; son [name] lives in [place] and runs a business that puts antennaes on cell towers.  The patient has 8 grandchildren.  She  attends our [employer].    ADVANCED DIRECTIVES: In the absence of any documents to the contrary the patient's husband is her healthcare power of attorney   HEALTH MAINTENANCE: Social History   Tobacco Use   Smoking status: Never   Smokeless tobacco: Never  Vaping Use   Vaping Use: Never used  Substance Use Topics   Alcohol use: No   Drug use: No     Colonoscopy: "Less than 10 years ago"; [facility]  PAP: Remote  Bone density: Never   No Known Allergies  Current Outpatient Medications  Medication Sig Dispense Refill   acetaminophen (TYLENOL) 500 MG tablet Take 1 tablet (500 mg total) by mouth every 8 (eight) hours as needed. Take with aleve 220 md 60 tablet 0   anastrozole (ARIMIDEX) 1 MG tablet Take 1 tablet (1 mg total) by mouth daily. 90 tablet 4   b complex vitamins tablet Take 1 tablet by mouth 3 (three) times a week.     calcium gluconate 500 MG tablet Take 1 tablet (500 mg total)  by mouth 3 (three) times daily.     calcium gluconate 500 MG tablet Take 1 tablet (500 mg total) by mouth 3 (three) times daily.     cholecalciferol (VITAMIN D) 1000 UNITS tablet Take 1,000 Units by mouth daily.     hydrochlorothiazide (HYDRODIURIL) 25 MG tablet Take 1 tablet (25 mg total) by mouth daily. 90 tablet 4   latanoprost (XALATAN) 0.005 % ophthalmic solution Place 1 drop into both eyes at bedtime.     loratadine (CLARITIN) 10 MG tablet Take 1 tablet (10 mg total) by mouth daily. 90 tablet 4   losartan (COZAAR) 50 MG tablet Take 1 tablet (50 mg total) by mouth daily.     Multiple Vitamins-Minerals (MULTIVITAMIN WITH MINERALS) tablet Take 1 tablet by mouth daily.     naproxen sodium (ALEVE) 220 MG tablet Take 1 tablet (220 mg total) by mouth every 8 (eight) hours as needed. Take with tylenol 500 mg. 60 tablet 3   pantoprazole (PROTONIX) 40 MG tablet Take 40 mg by mouth every other day.     Turmeric 500 MG TABS Take 1,500 mg by mouth daily.     No current  facility-administered medications for this visit.    OBJECTIVE: White woman who appears stated age  Vitals:   07/07/21 0908  BP: (!) 150/60  Pulse: 93  Resp: 18  Temp: (!) 97.2 F (36.2 C)  SpO2: 96%   Wt Readings from Last 3 Encounters:  07/07/21 139 lb 9.6 oz (63.3 kg)  04/10/21 133 lb (60.3 kg)  01/27/21 137 lb 1.6 oz (62.2 kg)   Body mass index is 28.2 kg/m.    ECOG FS:1 - Symptomatic but completely ambulatory  Hair has grown back full, curly, salt-and-pepper Sclerae unicteric, EOMs intact Wearing a mask No cervical or supraclavicular adenopathy Lungs no rales or rhonchi Heart regular rate and rhythm Abd soft, nontender, positive bowel sounds MSK no focal spinal tenderness, no upper extremity lymphedema Neuro: nonfocal, well oriented, appropriate affect Breasts: The right breast is unremarkable.  The left breast is status post lumpectomy and radiation.  There is a palpable fixed mass in the left axilla which has been evaluated with ultrasound and is known to be a seroma.  There is no evidence of local recurrence. Skin: The left big toenail shows some changes consistent with chemotherapy damage.  A more normal nail appears to be trying to grow under it.  There is no inflammation.  LAB RESULTS:  CMP     Component Value Date/Time   NA 141 01/27/2021 0857   K 4.5 01/27/2021 0857   CL 103 01/27/2021 0857   CO2 28 01/27/2021 0857   GLUCOSE 94 01/27/2021 0857   BUN 26 (H) 01/27/2021 0857   CREATININE 1.10 (H) 01/27/2021 0857   CREATININE 1.06 (H) 11/29/2019 0845   CALCIUM 10.2 01/27/2021 0857   PROT 7.5 01/27/2021 0857   ALBUMIN 4.3 01/27/2021 0857   AST 24 01/27/2021 0857   AST 23 11/29/2019 0845   ALT 14 01/27/2021 0857   ALT 17 11/29/2019 0845   ALKPHOS 40 01/27/2021 0857   BILITOT 0.7 01/27/2021 0857   BILITOT 0.6 11/29/2019 0845   GFRNONAA 53 (L) 01/27/2021 0857   GFRNONAA 52 (L) 11/29/2019 0845   GFRAA 58 (L) 04/30/2020 1020   GFRAA >60 11/29/2019 0845     No results found for: TOTALPROTELP, ALBUMINELP, A1GS, A2GS, BETS, BETA2SER, GAMS, MSPIKE, SPEI  Lab Results  Component Value Date   WBC 4.6 01/27/2021  NEUTROABS 2.6 01/27/2021   HGB 12.0 01/27/2021   HCT 35.9 (L) 01/27/2021   MCV 88.9 01/27/2021   PLT 274 01/27/2021    No results found for: LABCA2  No components found for: NPYYFR102  No results for input(s): INR in the last 168 hours.  No results found for: LABCA2  No results found for: TRZ735  No results found for: CAN125  No results found for: APO141  Lab Results  Component Value Date   CA2729 10.4 02/13/2020    No components found for: HGQUANT  No results found for: CEA1 / No results found for: CEA1   No results found for: AFPTUMOR  No results found for: CHROMOGRNA  No results found for: KPAFRELGTCHN, LAMBDASER, KAPLAMBRATIO (kappa/lambda light chains)  No results found for: HGBA, HGBA2QUANT, HGBFQUANT, HGBSQUAN (Hemoglobinopathy evaluation)   Lab Results  Component Value Date   LDH 230 (H) 06/28/2019    Lab Results  Component Value Date   IRON 106 01/27/2021   TIBC 325 01/27/2021   IRONPCTSAT 33 01/27/2021   (Iron and TIBC)  Lab Results  Component Value Date   FERRITIN 309 (H) 01/27/2021    Urinalysis    Component Value Date/Time   COLORURINE YELLOW 06/11/2013 2007   APPEARANCEUR CLEAR 06/11/2013 2007   LABSPEC 1.017 06/11/2013 2007   PHURINE 6.5 06/11/2013 2007   GLUCOSEU NEGATIVE 06/11/2013 2007   HGBUR NEGATIVE 06/11/2013 2007   [place] NEGATIVE 06/11/2013 2007   [place] NEGATIVE 06/11/2013 2007   PROTEINUR NEGATIVE 06/11/2013 2007   UROBILINOGEN 0.2 06/11/2013 2007   NITRITE NEGATIVE 06/11/2013 2007   LEUKOCYTESUR NEGATIVE 06/11/2013 2007    STUDIES: No results found.   ELIGIBLE FOR AVAILABLE RESEARCH PROTOCOL: AET  ASSESSMENT: 75 y.o. [place] woman status post left breast upper outer quadrant biopsy 11/17/2019 for a clinical T2 N1, stage IIA invasive  ductal carcinoma, grade 2, estrogen and progesterone receptor positive, HER-2 not amplified, with an MIB-1 of 30%.  (1) status post left lumpectomy and axillary lymph node dissection 01/12/2020 for a pT2 pN2, stage IIIA invasive ductal carcinoma, grade 3, with negative margins  (a) a total of 14 axillary lymph nodes removed, 8 positive, with extracapsular extension  (2) adjuvant chemotherapy consisting of doxorubicin and cyclophosphamide in dose dense fashion x4 started 02/13/2020, completed 04/10/2020, followed by weekly paclitaxel x12 started 04/30/2020, last dose 06/18/2020  (a) echocardiogram 02/08/2020 showed an ejection fraction in the 65/70%  (b) final 2 cycles of doxorubicin/cyclophosphamide given 21 days apart and 18% dose reduced  (c) final 4 doses of paclitaxel omitted secondary to fatigue and neuropathy  (3) adjuvant radiation 07/18/2020 through 09/05/2020 Site Technique Total Dose (Gy) Dose per Fx (Gy) Completed Fx Beam Energies  Breast, Left: Breast_Lt 3D 50.4/50.4 1.8 28/28 6X, 10X  Breast, Left: Breast_Lt_SCLV 3D 50.4/50.4 1.8 28/28 6X, 10X  Breast, Left: Breast_Lt_Bst 3D 10/10 2 5/5 6X, 10X   (4) anastrozole prescribed January 2022 but not started by the patient at that time  (a) bone density ordered for November 2022 but not scheduled  PLAN: Alajiah is now a year and a half out from definitive surgery for her breast cancer with no evidence of disease recurrence.  This is very favorable.  She never did start anastrozole.  We reviewed that today.  She understands this pill will cut her risk of breast cancer in half.  We reviewed the possible toxicities side effects and complications and she understands the need for baseline bone density.  I read nude the prescription and wrote  for her to have a DEXA scan with her April mammography at [facility].  She will return to see [place] in early May for follow-up  I have encouraged her to exercise 45 minutes a day at least 5 days a  week.  She understands she does not have to do 45 minutes in a row.  She could do 20 minutes here and 25 minutes there for example.  Her dog certainly will enjoy walking with her twice a day if she would like to do that.  I think if she did that she would feel better, and sleep better.  She also has Silver sneakers which I encouraged her to explore  Otherwise I am delighted at how well she is doing.  She will return to see [place] in May as stated.  She knows to call for any other issue that may develop before that visit.  Total encounter time 25 minutes.Sarajane Jews C. Magdelyn Roebuck, MD 07/07/2021 9:10 AM Medical Oncology and Hematology [facility] [place], [place] [address] Tel. [phone]    Fax. [phone]   I, Wilburn Mylar, am acting as scribe for Dr. Virgie Dad. Cordero Surette.  I, Lurline Del MD, have reviewed the above documentation for accuracy and completeness, and I agree with the above.   *Total Encounter Time as defined by the Centers for Medicare and Medicaid Services includes, in addition to the face-to-face time of a patient visit (documented in the note above) non-face-to-face time: obtaining and reviewing outside history, ordering and reviewing medications, tests or procedures, care coordination (communications with other health care professionals or caregivers) and documentation in the medical record.

## 2021-07-07 ENCOUNTER — Telehealth: Payer: Self-pay | Admitting: Oncology

## 2021-07-07 ENCOUNTER — Inpatient Hospital Stay: Payer: Medicare HMO | Admitting: Oncology

## 2021-07-07 ENCOUNTER — Other Ambulatory Visit: Payer: Self-pay

## 2021-07-07 ENCOUNTER — Inpatient Hospital Stay: Payer: Medicare HMO | Attending: Oncology

## 2021-07-07 VITALS — BP 150/60 | HR 93 | Temp 97.2°F | Resp 18 | Ht 59.0 in | Wt 139.6 lb

## 2021-07-07 DIAGNOSIS — Z8249 Family history of ischemic heart disease and other diseases of the circulatory system: Secondary | ICD-10-CM | POA: Insufficient documentation

## 2021-07-07 DIAGNOSIS — Z9221 Personal history of antineoplastic chemotherapy: Secondary | ICD-10-CM | POA: Insufficient documentation

## 2021-07-07 DIAGNOSIS — G620 Drug-induced polyneuropathy: Secondary | ICD-10-CM | POA: Diagnosis not present

## 2021-07-07 DIAGNOSIS — Z79899 Other long term (current) drug therapy: Secondary | ICD-10-CM | POA: Diagnosis not present

## 2021-07-07 DIAGNOSIS — T451X5A Adverse effect of antineoplastic and immunosuppressive drugs, initial encounter: Secondary | ICD-10-CM | POA: Insufficient documentation

## 2021-07-07 DIAGNOSIS — Z818 Family history of other mental and behavioral disorders: Secondary | ICD-10-CM | POA: Insufficient documentation

## 2021-07-07 DIAGNOSIS — R5383 Other fatigue: Secondary | ICD-10-CM | POA: Diagnosis not present

## 2021-07-07 DIAGNOSIS — I10 Essential (primary) hypertension: Secondary | ICD-10-CM | POA: Diagnosis not present

## 2021-07-07 DIAGNOSIS — Z923 Personal history of irradiation: Secondary | ICD-10-CM | POA: Diagnosis not present

## 2021-07-07 DIAGNOSIS — Z170 Estrogen receptor positive status [ER+]: Secondary | ICD-10-CM | POA: Insufficient documentation

## 2021-07-07 DIAGNOSIS — Z79811 Long term (current) use of aromatase inhibitors: Secondary | ICD-10-CM | POA: Diagnosis not present

## 2021-07-07 DIAGNOSIS — C773 Secondary and unspecified malignant neoplasm of axilla and upper limb lymph nodes: Secondary | ICD-10-CM | POA: Diagnosis not present

## 2021-07-07 DIAGNOSIS — Z803 Family history of malignant neoplasm of breast: Secondary | ICD-10-CM | POA: Insufficient documentation

## 2021-07-07 DIAGNOSIS — Z87442 Personal history of urinary calculi: Secondary | ICD-10-CM | POA: Insufficient documentation

## 2021-07-07 DIAGNOSIS — C50412 Malignant neoplasm of upper-outer quadrant of left female breast: Secondary | ICD-10-CM

## 2021-07-07 DIAGNOSIS — Z808 Family history of malignant neoplasm of other organs or systems: Secondary | ICD-10-CM | POA: Insufficient documentation

## 2021-07-07 LAB — COMPREHENSIVE METABOLIC PANEL
ALT: 16 U/L (ref 0–44)
AST: 24 U/L (ref 15–41)
Albumin: 4.3 g/dL (ref 3.5–5.0)
Alkaline Phosphatase: 43 U/L (ref 38–126)
Anion gap: 10 (ref 5–15)
BUN: 26 mg/dL — ABNORMAL HIGH (ref 8–23)
CO2: 28 mmol/L (ref 22–32)
Calcium: 9.9 mg/dL (ref 8.9–10.3)
Chloride: 102 mmol/L (ref 98–111)
Creatinine, Ser: 1.11 mg/dL — ABNORMAL HIGH (ref 0.44–1.00)
GFR, Estimated: 52 mL/min — ABNORMAL LOW (ref >60)
Glucose, Bld: 92 mg/dL (ref 70–99)
Potassium: 3.8 mmol/L (ref 3.5–5.1)
Sodium: 140 mmol/L (ref 135–145)
Total Bilirubin: 0.7 mg/dL (ref 0.3–1.2)
Total Protein: 7.9 g/dL (ref 6.5–8.1)

## 2021-07-07 LAB — CBC WITH DIFFERENTIAL/PLATELET
Abs Immature Granulocytes: 0.02 10*3/uL (ref 0.00–0.07)
Basophils Absolute: 0.1 10*3/uL (ref 0.0–0.1)
Basophils Relative: 1 %
Eosinophils Absolute: 0.5 10*3/uL (ref 0.0–0.5)
Eosinophils Relative: 6 %
HCT: 37.4 % (ref 36.0–46.0)
Hemoglobin: 12.5 g/dL (ref 12.0–15.0)
Immature Granulocytes: 0 %
Lymphocytes Relative: 23 %
Lymphs Abs: 1.7 10*3/uL (ref 0.7–4.0)
MCH: 30 pg (ref 26.0–34.0)
MCHC: 33.4 g/dL (ref 30.0–36.0)
MCV: 89.9 fL (ref 80.0–100.0)
Monocytes Absolute: 0.6 10*3/uL (ref 0.1–1.0)
Monocytes Relative: 8 %
Neutro Abs: 4.5 10*3/uL (ref 1.7–7.7)
Neutrophils Relative %: 62 %
Platelets: 318 10*3/uL (ref 150–400)
RBC: 4.16 MIL/uL (ref 3.87–5.11)
RDW: 13.6 % (ref 11.5–15.5)
WBC: 7.3 10*3/uL (ref 4.0–10.5)
nRBC: 0 % (ref 0.0–0.2)

## 2021-07-07 MED ORDER — ANASTROZOLE 1 MG PO TABS: 1.0000 mg | ORAL_TABLET | Freq: Every day | ORAL | 4 refills | Status: DC

## 2021-07-07 NOTE — Telephone Encounter (Signed)
Scheduled appointment per 1/25 los. Patient is aware. 

## 2021-08-03 DIAGNOSIS — U071 COVID-19: Secondary | ICD-10-CM | POA: Diagnosis not present

## 2021-09-03 DIAGNOSIS — U071 COVID-19: Secondary | ICD-10-CM | POA: Diagnosis not present

## 2021-09-09 DIAGNOSIS — H524 Presbyopia: Secondary | ICD-10-CM | POA: Diagnosis not present

## 2021-09-09 DIAGNOSIS — H2513 Age-related nuclear cataract, bilateral: Secondary | ICD-10-CM | POA: Diagnosis not present

## 2021-09-09 DIAGNOSIS — H401131 Primary open-angle glaucoma, bilateral, mild stage: Secondary | ICD-10-CM | POA: Diagnosis not present

## 2021-10-01 DIAGNOSIS — U071 COVID-19: Secondary | ICD-10-CM | POA: Diagnosis not present

## 2021-10-08 ENCOUNTER — Telehealth: Payer: Self-pay | Admitting: Hematology and Oncology

## 2021-10-08 NOTE — Telephone Encounter (Signed)
Rescheduled appointment per provders template. Left message ?

## 2021-10-10 ENCOUNTER — Telehealth: Payer: Self-pay | Admitting: Hematology and Oncology

## 2021-10-10 NOTE — Telephone Encounter (Signed)
.  Called patient to schedule appointment per 3/9 inbasket, patient is aware of date and time.   ?

## 2021-11-14 ENCOUNTER — Other Ambulatory Visit: Payer: Self-pay | Admitting: *Deleted

## 2021-11-14 MED ORDER — HYDROCHLOROTHIAZIDE 25 MG PO TABS: 25.0000 mg | ORAL_TABLET | Freq: Every day | ORAL | 0 refills | Status: DC

## 2021-12-10 ENCOUNTER — Ambulatory Visit: Payer: Medicare HMO | Admitting: Hematology and Oncology

## 2021-12-10 ENCOUNTER — Other Ambulatory Visit: Payer: Medicare HMO

## 2021-12-11 ENCOUNTER — Ambulatory Visit: Payer: Medicare HMO | Admitting: Hematology and Oncology

## 2021-12-11 ENCOUNTER — Ambulatory Visit
Admission: RE | Admit: 2021-12-11 | Discharge: 2021-12-11 | Disposition: A | Discharge status: Home / Self Care | Payer: Medicare HMO | Source: Ambulatory Visit | Attending: Oncology | Admitting: Oncology

## 2021-12-11 ENCOUNTER — Other Ambulatory Visit: Payer: Self-pay | Admitting: Hematology and Oncology

## 2021-12-11 ENCOUNTER — Other Ambulatory Visit: Payer: Medicare HMO

## 2021-12-11 DIAGNOSIS — Z170 Estrogen receptor positive status [ER+]: Secondary | ICD-10-CM

## 2021-12-11 DIAGNOSIS — R922 Inconclusive mammogram: Secondary | ICD-10-CM | POA: Diagnosis not present

## 2021-12-11 DIAGNOSIS — Z780 Asymptomatic menopausal state: Secondary | ICD-10-CM | POA: Diagnosis not present

## 2021-12-11 DIAGNOSIS — Z853 Personal history of malignant neoplasm of breast: Secondary | ICD-10-CM | POA: Diagnosis not present

## 2021-12-12 ENCOUNTER — Other Ambulatory Visit: Payer: Medicare HMO

## 2021-12-15 ENCOUNTER — Other Ambulatory Visit: Payer: Self-pay | Admitting: *Deleted

## 2021-12-15 DIAGNOSIS — C50412 Malignant neoplasm of upper-outer quadrant of left female breast: Secondary | ICD-10-CM

## 2021-12-16 ENCOUNTER — Telehealth: Payer: Self-pay | Admitting: Hematology and Oncology

## 2021-12-16 ENCOUNTER — Inpatient Hospital Stay: Payer: Medicare HMO | Admitting: Hematology and Oncology

## 2021-12-16 ENCOUNTER — Inpatient Hospital Stay: Payer: Medicare HMO

## 2021-12-16 NOTE — Telephone Encounter (Signed)
Per 5/16 in basket called pt to reschedule appointment.  Pt confirmed appointment  ?

## 2021-12-16 NOTE — Progress Notes (Deleted)
[place]  [phone] Fax:([phone]    ID: Cynthia Vasquez DOB: 1946/01/14  MR#: [id number]  HQP#:[id number]  Patient Care Team: Sueanne Margarita, DO as PCP - General (Internal Medicine) Rockwell Germany, RN as Oncology Nurse Navigator Mauro Kaufmann, RN as Oncology Nurse Navigator Magrinat, Virgie Dad, MD (Inactive) as Consulting Physician (Oncology) Alphonsa Overall, MD as Consulting Physician (General Surgery) Kyung Rudd, MD as Consulting Physician (Radiation Oncology) Wilford Corner, MD as Consulting Physician (Gastroenterology) Benay Pike, MD OTHER MD:   CHIEF COMPLAINT: Estrogen receptor positive breast cancer  CURRENT TREATMENT: to start anastrozole   INTERVAL HISTORY: Cynthia Vasquez returns today for follow-up of her estrogen receptor positive breast cancer.    She was prescribed anastrozole in late 09/2020.  However today she tells me she does not recognize that drug, never picked it up.  Her most recent mammogram was 11/21/2020 at the breast center, showing breast density category C, leading to biopsy of an area of the right breast lower outer quadrant which showed only fibrocystic change, concordant.  Ultrasound of the left breast 11/21/2020 confirmed a seroma  Since her last visit, she has not undergone any additional studies. A bone density screening was ordered to be done in November, but this does not appear to have been scheduled.  Of note, she did have her port removed on 04/10/2021.  She was contacted by our research nurse regarding our new clinical neuropathy study. She did not reach back out with a final decision but today tells me as she is not interested in participating because there is a placebo-controlled.  REVIEW OF SYSTEMS: Cynthia Vasquez is not exercising regularly.  She takes her dog for a walk once a day, but that is just 1 block.  She does have Silver sneakers but is not in a gym.  She takes a nap most days then goes to bed about  midnight.  Gets out of bed around 6:30 in the morning.  She says she has trouble falling asleep because her "brain is turning".  A detailed review of systems today was otherwise stable.   COVID 19 VACCINATION STATUS: Had COVID 2020; status post [place] x2, as of December 2022  HISTORY OF CURRENT ILLNESS: From the original intake note:  Korena herself palpated a left breast mass, with associated dimpling, tenderness, and pinching pain. She underwent bilateral diagnostic mammography with tomography and left breast ultrasonography at [facility] on 11/09/2019 showing: breast density category C; 2.9 cm irregular mass in left breast at 2:30 corresponding to palpable abnormality; three enlarged left axillary lymph nodes.  Accordingly on 11/17/2019 she proceeded to biopsy of the left breast area in question. The pathology from this procedure ([id number]) showed: invasive mammary carcinoma, grade 2, e-cadherin positive. Prognostic indicators significant for: estrogen receptor, 95% positive and progesterone receptor, 95% positive, both with strong staining intensity. Proliferation marker Ki67 at 30%. HER2 negative by immunohistochemistry (1+).  The biopsied lymph node was positive for metastatic carcinoma.  The patient's subsequent history is as detailed below.   PAST MEDICAL HISTORY: Past Medical History:  Diagnosis Date   Atrial septal defect    repaired age 48   Cancer ([place]) 01/2020   IDC left breast   GERD (gastroesophageal reflux disease)    Glaucoma    Headache    migraines until age 10- 66, have them 3-4 x year   History of blood transfusion    with ASD surgery at age 10   History of kidney stones  passed stones   Hypertension    Personal history of chemotherapy    Personal history of radiation therapy    Vitamin D deficiency     PAST SURGICAL HISTORY: Past Surgical History:  Procedure Laterality Date   ASD REPAIR     age 78   BREAST LUMPECTOMY     BREAST LUMPECTOMY WITH  RADIOACTIVE SEED AND AXILLARY LYMPH NODE DISSECTION Left 01/12/2020   Procedure: LEFT BREAST LUMPECTOMY WITH RADIOACTIVE SEED AND LEFT AXILLARY LYMPH NODE DISSECTION;  Surgeon: Alphonsa Overall, MD;  Location: [facility];  Service: General;  Laterality: Left;  PEC BLOCK   COLONOSCOPY     IR REMOVAL TUN ACCESS W/ PORT W/O FL MOD SED  04/10/2021   PORTACATH PLACEMENT Right 02/02/2020   Procedure: INSERTION PORT-A-CATH WITH ULTRASOUND GUIDANCE;  Surgeon: Alphonsa Overall, MD;  Location: [facility];  Service: General;  Laterality: Right;   UPPER GI ENDOSCOPY      FAMILY HISTORY: Family History  Problem Relation Age of Onset   Dementia Mother    Heart attack Father    Hypertension Father    Breast cancer Sister 19  The patient's father died at age 71 from a myocardial infarction.  The patient's mother died at 71 with Alzheimer's disease.  The patient has 2 brothers, 2 sisters.  1 sister developed breast cancer at the age of 47.  The patient's daughter has a history of thyroid cancer.  She has not been genetically tested.  There is no other history of cancer in the family to the patient's knowledge.   GYNECOLOGIC HISTORY:  No LMP recorded (lmp unknown). Patient is postmenopausal. Menarche: 76 years old Age at first live birth: 76 years old Ross P 3 LMP age 58 Contraceptive approximately 2 years, with no complications HRT no  Hysterectomy?  No BSO?  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  Her husband Cynthia Vasquez (goes by [employer]") is also a retired Optometrist.  Son Cynthia Vasquez 43 lives in [place] and works for a company that processes linnen for hospitals, but he completed a masters in Chief Executive Officer and is looking for a different job; daughter Cynthia Vasquez lives in [place] and is a Oncologist; son Cynthia Vasquez lives in [place] and runs a business that puts antennaes on cell towers.  The patient has 8 grandchildren.  She  attends our [employer].    ADVANCED DIRECTIVES: In the absence of any documents to the contrary the patient's husband is her healthcare power of attorney   HEALTH MAINTENANCE: Social History   Tobacco Use   Smoking status: Never   Smokeless tobacco: Never  Vaping Use   Vaping Use: Never used  Substance Use Topics   Alcohol use: No   Drug use: No     Colonoscopy: "Less than 10 years ago"; [facility]  PAP: Remote  Bone density: Never   No Known Allergies  Current Outpatient Medications  Medication Sig Dispense Refill   acetaminophen (TYLENOL) 500 MG tablet Take 1 tablet (500 mg total) by mouth every 8 (eight) hours as needed. Take with aleve 220 md 60 tablet 0   anastrozole (ARIMIDEX) 1 MG tablet Take 1 tablet (1 mg total) by mouth daily. 90 tablet 4   b complex vitamins tablet Take 1 tablet by mouth 3 (three) times a week.     calcium gluconate 500 MG tablet Take 1 tablet (500 mg total)  by mouth 3 (three) times daily.     calcium gluconate 500 MG tablet Take 1 tablet (500 mg total) by mouth 3 (three) times daily.     cholecalciferol (VITAMIN D) 1000 UNITS tablet Take 1,000 Units by mouth daily.     hydrochlorothiazide (HYDRODIURIL) 25 MG tablet Take 1 tablet (25 mg total) by mouth daily. 90 tablet 0   latanoprost (XALATAN) 0.005 % ophthalmic solution Place 1 drop into both eyes at bedtime.     loratadine (CLARITIN) 10 MG tablet Take 1 tablet (10 mg total) by mouth daily. 90 tablet 4   losartan (COZAAR) 50 MG tablet Take 1 tablet (50 mg total) by mouth daily.     Multiple Vitamins-Minerals (MULTIVITAMIN WITH MINERALS) tablet Take 1 tablet by mouth daily.     naproxen sodium (ALEVE) 220 MG tablet Take 1 tablet (220 mg total) by mouth every 8 (eight) hours as needed. Take with tylenol 500 mg. 60 tablet 3   pantoprazole (PROTONIX) 40 MG tablet Take 40 mg by mouth every other day.     Turmeric 500 MG TABS Take 1,500 mg by mouth daily.     No current  facility-administered medications for this visit.    OBJECTIVE: White woman who appears stated age  There were no vitals filed for this visit.  Wt Readings from Last 3 Encounters:  07/07/21 139 lb 9.6 oz (63.3 kg)  04/10/21 133 lb (60.3 kg)  01/27/21 137 lb 1.6 oz (62.2 kg)   There is no height or weight on file to calculate BMI.    ECOG FS:1 - Symptomatic but completely ambulatory  Hair has grown back full, curly, salt-and-pepper Sclerae unicteric, EOMs intact Wearing a mask No cervical or supraclavicular adenopathy Lungs no rales or rhonchi Heart regular rate and rhythm Abd soft, nontender, positive bowel sounds MSK no focal spinal tenderness, no upper extremity lymphedema Neuro: nonfocal, well oriented, appropriate affect Breasts: The right breast is unremarkable.  The left breast is status post lumpectomy and radiation.  There is a palpable fixed mass in the left axilla which has been evaluated with ultrasound and is known to be a seroma.  There is no evidence of local recurrence. Skin: The left big toenail shows some changes consistent with chemotherapy damage.  A more normal nail appears to be trying to grow under it.  There is no inflammation.  LAB RESULTS:  CMP     Component Value Date/Time   NA 140 07/07/2021 0856   K 3.8 07/07/2021 0856   CL 102 07/07/2021 0856   CO2 28 07/07/2021 0856   GLUCOSE 92 07/07/2021 0856   BUN 26 (H) 07/07/2021 0856   CREATININE 1.11 (H) 07/07/2021 0856   CREATININE 1.06 (H) 11/29/2019 0845   CALCIUM 9.9 07/07/2021 0856   PROT 7.9 07/07/2021 0856   ALBUMIN 4.3 07/07/2021 0856   AST 24 07/07/2021 0856   AST 23 11/29/2019 0845   ALT 16 07/07/2021 0856   ALT 17 11/29/2019 0845   ALKPHOS 43 07/07/2021 0856   BILITOT 0.7 07/07/2021 0856   BILITOT 0.6 11/29/2019 0845   GFRNONAA 52 (L) 07/07/2021 0856   GFRNONAA 52 (L) 11/29/2019 0845   GFRAA 58 (L) 04/30/2020 1020   GFRAA >60 11/29/2019 0845    No results found for: TOTALPROTELP,  ALBUMINELP, A1GS, A2GS, BETS, BETA2SER, GAMS, MSPIKE, SPEI  Lab Results  Component Value Date   WBC 7.3 07/07/2021   NEUTROABS 4.5 07/07/2021   HGB 12.5 07/07/2021   HCT 37.4 07/07/2021  MCV 89.9 07/07/2021   PLT 318 07/07/2021    No results found for: LABCA2  No components found for: LPFXTK240  No results for input(s): INR in the last 168 hours.  No results found for: LABCA2  No results found for: XBD532  No results found for: CAN125  No results found for: DJM426  Lab Results  Component Value Date   CA2729 10.4 02/13/2020    No components found for: HGQUANT  No results found for: CEA1 / No results found for: CEA1   No results found for: AFPTUMOR  No results found for: CHROMOGRNA  No results found for: KPAFRELGTCHN, LAMBDASER, KAPLAMBRATIO (kappa/lambda light chains)  No results found for: HGBA, HGBA2QUANT, HGBFQUANT, HGBSQUAN (Hemoglobinopathy evaluation)   Lab Results  Component Value Date   LDH 230 (H) 06/28/2019    Lab Results  Component Value Date   IRON 106 01/27/2021   TIBC 325 01/27/2021   IRONPCTSAT 33 01/27/2021   (Iron and TIBC)  Lab Results  Component Value Date   FERRITIN 309 (H) 01/27/2021    Urinalysis    Component Value Date/Time   COLORURINE YELLOW 06/11/2013 2007   APPEARANCEUR CLEAR 06/11/2013 2007   LABSPEC 1.017 06/11/2013 2007   PHURINE 6.5 06/11/2013 2007   GLUCOSEU NEGATIVE 06/11/2013 2007   HGBUR NEGATIVE 06/11/2013 2007   [place] NEGATIVE 06/11/2013 2007   [place] NEGATIVE 06/11/2013 2007   PROTEINUR NEGATIVE 06/11/2013 2007   UROBILINOGEN 0.2 06/11/2013 2007   NITRITE NEGATIVE 06/11/2013 2007   LEUKOCYTESUR NEGATIVE 06/11/2013 2007    STUDIES: DG Bone Density  Result Date: 12/11/2021 EXAM: DUAL X-RAY ABSORPTIOMETRY (DXA) FOR BONE MINERAL DENSITY IMPRESSION: Referring Physician:  Chauncey Cruel Your patient completed a bone mineral density test using GE Lunar iDXA system (analysis version: 16).  Technologist: Be[place] PATIENT: Name: Cynthia Vasquez, Cynthia Vasquez Patient ID: [id number] Birth Date: 08-16-1945 Height: 59.0 in. Sex: Female Measured: 12/11/2021 Weight: 140.4 lbs. Indications: Advanced Age, Breast Cancer History, Caucasian, Estrogen Deficient, Postmenopausal Fractures: NONE Treatments: Calcium (E943.0), Vitamin D (E933.5) ASSESSMENT: The BMD measured at AP Spine L1-L2 is 1.076 g/cm2 with a T-score of -0.7. This patient is considered normal according to [facility]) criteria. The quality of the exam is good. L3, L4 was excluded due to degenerative changes. Site Region Measured Date Measured Age YA BMD Significant CHANGE T-score AP Spine  L1-L2      12/11/2021    75.4         -0.7    1.076 g/cm2 DualFemur Neck Left  12/11/2021    75.4         -0.2    1.004 g/cm2 DualFemur Total Mean 12/11/2021    75.4         0.9     1.118 g/cm2 [facility]) criteria for post-menopausal, Caucasian Women: Normal       T-score at or above -1 SD Osteopenia   T-score between -1 and -2.5 SD Osteoporosis T-score at or below -2.5 SD RECOMMENDATION: 1. All patients should optimize calcium and vitamin D intake. 2. Consider FDA-approved medical therapies in postmenopausal women and men aged 36 years and older, based on the following: a. A hip or vertebral (clinical or morphometric) fracture. b. T-score = -2.5 at the femoral neck or spine after appropriate evaluation to exclude secondary causes. c. Low bone mass (T-score between -1.0 and -2.5 at the femoral neck or spine) and a 10-year probability of a hip fracture = 3% or a 10-year probability of a  major osteoporosis-related fracture = 20% based on the [place]-adapted WHO algorithm. d. Clinician judgment and/or patient preferences may indicate treatment for people with 10-year fracture probabilities above or below these levels. FOLLOW-UP: Patients with diagnosis of osteoporosis or at high risk for fracture should have regular bone mineral density tests.? Patients  eligible for Medicare are allowed routine testing every 2 years.? The testing frequency can be increased to one year for patients who have rapidly progressing disease, are receiving or discontinuing medical therapy to restore bone mass, or have additional risk factors. I have reviewed this study and agree with the findings. [facility] Electronically Signed   By: Franki Cabot M.D.   On: 12/11/2021 11:36   MM DIAG BREAST TOMO BILATERAL  Result Date: 12/11/2021 CLINICAL DATA:  Status post left lumpectomy for breast cancer in June 2021 with radiation therapy and chemotherapy. EXAM: DIGITAL DIAGNOSTIC BILATERAL MAMMOGRAM WITH TOMOSYNTHESIS AND CAD TECHNIQUE: Bilateral digital diagnostic mammography and breast tomosynthesis was performed. The images were evaluated with computer-aided detection. COMPARISON:  Previous exam(s). ACR Breast Density Category c: The breast tissue is heterogeneously dense, which may obscure small masses. FINDINGS: Stable post lumpectomy and postradiation changes on the left. No interval findings suspicious for malignancy in either breast. IMPRESSION: No evidence of malignancy. RECOMMENDATION: Bilateral diagnostic mammogram in 1 year. I have discussed the findings and recommendations with the patient. If applicable, a reminder letter will be sent to the patient regarding the next appointment. BI-RADS CATEGORY  2: Benign. Electronically Signed   By: Claudie Revering M.D.   On: 12/11/2021 09:47    ELIGIBLE FOR AVAILABLE RESEARCH PROTOCOL: AET  ASSESSMENT: 76 y.o. [place] woman status post left breast upper outer quadrant biopsy 11/17/2019 for a clinical T2 N1, stage IIA invasive ductal carcinoma, grade 2, estrogen and progesterone receptor positive, HER-2 not amplified, with an MIB-1 of 30%.  (1) status post left lumpectomy and axillary lymph node dissection 01/12/2020 for a pT2 pN2, stage IIIA invasive ductal carcinoma, grade 3, with negative margins  (a) a total of 14  axillary lymph nodes removed, 8 positive, with extracapsular extension  (2) adjuvant chemotherapy consisting of doxorubicin and cyclophosphamide in dose dense fashion x4 started 02/13/2020, completed 04/10/2020, followed by weekly paclitaxel x12 started 04/30/2020, last dose 06/18/2020  (a) echocardiogram 02/08/2020 showed an ejection fraction in the 65/70%  (b) final 2 cycles of doxorubicin/cyclophosphamide given 21 days apart and 18% dose reduced  (c) final 4 doses of paclitaxel omitted secondary to fatigue and neuropathy  (3) adjuvant radiation 07/18/2020 through 09/05/2020 Site Technique Total Dose (Gy) Dose per Fx (Gy) Completed Fx Beam Energies  Breast, Left: Breast_Lt 3D 50.4/50.4 1.8 28/28 6X, 10X  Breast, Left: Breast_Lt_SCLV 3D 50.4/50.4 1.8 28/28 6X, 10X  Breast, Left: Breast_Lt_Bst 3D 10/10 2 5/5 6X, 10X   (4) anastrozole prescribed January 2022 but not started by the patient at that time  (a) bone density ordered for November 2022 but not scheduled  PLAN: Danely is now a year and a half out from definitive surgery for her breast cancer with no evidence of disease recurrence.  This is very favorable.  She never did start anastrozole.  We reviewed that today.  She understands this pill will cut her risk of breast cancer in half.  We reviewed the possible toxicities side effects and complications and she understands the need for baseline bone density.  I read nude the prescription and wrote for her to have a DEXA scan with [facility]  She will return to see [place] in early May for follow-up  I have encouraged her to exercise 45 minutes a day at least 5 days a week.  She understands she does not have to do 45 minutes in a row.  She could do 20 minutes here and 25 minutes there for example.  Her dog certainly will enjoy walking with her twice a day if she would like to do that.  I think if she did that she would feel better, and sleep better.  She also has  Silver sneakers which I encouraged her to explore  Otherwise I am delighted at how well she is doing.  She will return to see [place] in May as stated.  She knows to call for any other issue that may develop before that visit.  Total encounter time 25 minutes.Sarajane Jews C. Magrinat, MD 12/16/2021 10:14 AM Medical Oncology and Hematology [facility] [place], [place] [address] Tel. [phone]    Fax. [phone]   I, Wilburn Mylar, am acting as scribe for Dr. Virgie Dad. Magrinat.  I, Lurline Del MD, have reviewed the above documentation for accuracy and completeness, and I agree with the above.   *Total Encounter Time as defined by the Centers for Medicare and Medicaid Services includes, in addition to the face-to-face time of a patient visit (documented in the note above) non-face-to-face time: obtaining and reviewing outside history, ordering and reviewing medications, tests or procedures, care coordination (communications with other health care professionals or caregivers) and documentation in the medical record.

## 2022-01-05 DIAGNOSIS — H401131 Primary open-angle glaucoma, bilateral, mild stage: Secondary | ICD-10-CM | POA: Diagnosis not present

## 2022-01-06 ENCOUNTER — Inpatient Hospital Stay: Payer: Medicare HMO | Admitting: Hematology and Oncology

## 2022-01-06 ENCOUNTER — Inpatient Hospital Stay: Payer: Medicare HMO | Attending: Hematology and Oncology

## 2022-01-06 ENCOUNTER — Other Ambulatory Visit: Payer: Self-pay

## 2022-01-06 ENCOUNTER — Encounter: Payer: Self-pay | Admitting: Hematology and Oncology

## 2022-01-06 VITALS — BP 123/78 | HR 72 | Temp 98.1°F | Resp 16 | Ht 59.0 in | Wt 141.8 lb

## 2022-01-06 DIAGNOSIS — Z170 Estrogen receptor positive status [ER+]: Secondary | ICD-10-CM | POA: Diagnosis not present

## 2022-01-06 DIAGNOSIS — Z79811 Long term (current) use of aromatase inhibitors: Secondary | ICD-10-CM | POA: Insufficient documentation

## 2022-01-06 DIAGNOSIS — Z818 Family history of other mental and behavioral disorders: Secondary | ICD-10-CM | POA: Diagnosis not present

## 2022-01-06 DIAGNOSIS — Z803 Family history of malignant neoplasm of breast: Secondary | ICD-10-CM | POA: Insufficient documentation

## 2022-01-06 DIAGNOSIS — Z9221 Personal history of antineoplastic chemotherapy: Secondary | ICD-10-CM | POA: Insufficient documentation

## 2022-01-06 DIAGNOSIS — R5383 Other fatigue: Secondary | ICD-10-CM | POA: Diagnosis not present

## 2022-01-06 DIAGNOSIS — C50412 Malignant neoplasm of upper-outer quadrant of left female breast: Secondary | ICD-10-CM | POA: Diagnosis not present

## 2022-01-06 DIAGNOSIS — Z87442 Personal history of urinary calculi: Secondary | ICD-10-CM | POA: Insufficient documentation

## 2022-01-06 DIAGNOSIS — I10 Essential (primary) hypertension: Secondary | ICD-10-CM | POA: Diagnosis not present

## 2022-01-06 DIAGNOSIS — G629 Polyneuropathy, unspecified: Secondary | ICD-10-CM | POA: Insufficient documentation

## 2022-01-06 DIAGNOSIS — Z8249 Family history of ischemic heart disease and other diseases of the circulatory system: Secondary | ICD-10-CM | POA: Diagnosis not present

## 2022-01-06 DIAGNOSIS — Z808 Family history of malignant neoplasm of other organs or systems: Secondary | ICD-10-CM | POA: Insufficient documentation

## 2022-01-06 DIAGNOSIS — Z923 Personal history of irradiation: Secondary | ICD-10-CM | POA: Diagnosis not present

## 2022-01-06 DIAGNOSIS — Z79899 Other long term (current) drug therapy: Secondary | ICD-10-CM | POA: Diagnosis not present

## 2022-01-06 LAB — CBC WITH DIFFERENTIAL (CANCER CENTER ONLY)
Abs Immature Granulocytes: 0.01 10*3/uL (ref 0.00–0.07)
Basophils Absolute: 0.1 10*3/uL (ref 0.0–0.1)
Basophils Relative: 1 %
Eosinophils Absolute: 0.3 10*3/uL (ref 0.0–0.5)
Eosinophils Relative: 6 %
HCT: 32.6 % — ABNORMAL LOW (ref 36.0–46.0)
Hemoglobin: 11 g/dL — ABNORMAL LOW (ref 12.0–15.0)
Immature Granulocytes: 0 %
Lymphocytes Relative: 26 %
Lymphs Abs: 1.5 10*3/uL (ref 0.7–4.0)
MCH: 30.3 pg (ref 26.0–34.0)
MCHC: 33.7 g/dL (ref 30.0–36.0)
MCV: 89.8 fL (ref 80.0–100.0)
Monocytes Absolute: 0.7 10*3/uL (ref 0.1–1.0)
Monocytes Relative: 12 %
Neutro Abs: 3.3 10*3/uL (ref 1.7–7.7)
Neutrophils Relative %: 55 %
Platelet Count: 255 10*3/uL (ref 150–400)
RBC: 3.63 MIL/uL — ABNORMAL LOW (ref 3.87–5.11)
RDW: 13.8 % (ref 11.5–15.5)
WBC Count: 5.8 10*3/uL (ref 4.0–10.5)
nRBC: 0 % (ref 0.0–0.2)

## 2022-01-06 LAB — CMP (CANCER CENTER ONLY)
ALT: 14 U/L (ref 0–44)
AST: 22 U/L (ref 15–41)
Albumin: 4.3 g/dL (ref 3.5–5.0)
Alkaline Phosphatase: 38 U/L (ref 38–126)
Anion gap: 6 (ref 5–15)
BUN: 31 mg/dL — ABNORMAL HIGH (ref 8–23)
CO2: 30 mmol/L (ref 22–32)
Calcium: 9.9 mg/dL (ref 8.9–10.3)
Chloride: 102 mmol/L (ref 98–111)
Creatinine: 1.14 mg/dL — ABNORMAL HIGH (ref 0.44–1.00)
GFR, Estimated: 50 mL/min — ABNORMAL LOW (ref >60)
Glucose, Bld: 108 mg/dL — ABNORMAL HIGH (ref 70–99)
Potassium: 4.5 mmol/L (ref 3.5–5.1)
Sodium: 138 mmol/L (ref 135–145)
Total Bilirubin: 0.5 mg/dL (ref 0.3–1.2)
Total Protein: 7.2 g/dL (ref 6.5–8.1)

## 2022-01-06 MED ORDER — ANASTROZOLE 1 MG PO TABS: 1.0000 mg | ORAL_TABLET | Freq: Every day | ORAL | 4 refills | Status: DC

## 2022-01-06 NOTE — Progress Notes (Signed)
[name]  [phone] [phone]) [phone]    ID: Cynthia Vasquez DOB: Sep 23, 1945  MR#: [id number]  [name]  Patient Care Team: Cynthia Margarita, DO as PCP - General (Internal Medicine) Cynthia Germany, RN as Oncology Nurse Navigator Cynthia Kaufmann, RN as Oncology Nurse Navigator Vasquez, Cynthia Dad, MD (Inactive) as Consulting Physician (Oncology) Alphonsa Overall, MD as Consulting Physician (General Surgery) Kyung Rudd, MD as Consulting Physician (Radiation Oncology) Wilford Corner, MD as Consulting Physician (Gastroenterology) Benay Pike, MD OTHER MD:  CHIEF COMPLAINT: Estrogen receptor positive breast cancer  CURRENT TREATMENT: to start anastrozole   INTERVAL HISTORY: Cynthia Vasquez returns today for follow-up of her estrogen receptor positive breast cancer.   She still didn't take anastrazole, she says she doesn't recognize the drug. She said the exact same thing last year when she saw Dr. Jana Vasquez.  She is curious as to why she needs to take the drug and what of the side effects.  She says she cannot remember many things and she still has foggy memory which she attributes to chemotherapy.  She denies any changes in her breast.  She recently had a mammogram and bone density.  No new cough, chest pain, shortness of breath, change in bowel habits, urinary habits.  Rest of the pertinent 10 point ROS reviewed and negative  REVIEW OF SYSTEMS:    COVID 19 VACCINATION STATUS: Had COVID 2020; status post [place] x2, as of December 2022  HISTORY OF CURRENT ILLNESS: From the original intake note:  Cynthia Vasquez herself palpated a left breast mass, with associated dimpling, tenderness, and pinching pain. She underwent bilateral diagnostic mammography with tomography and left breast ultrasonography at [facility] on 11/09/2019 showing: breast density category C; 2.9 cm irregular mass in left breast at 2:30 corresponding to palpable abnormality; three enlarged left  axillary lymph nodes.  Accordingly on 11/17/2019 she proceeded to biopsy of the left breast area in question. The pathology from this procedure ([id number]) showed: invasive mammary carcinoma, grade 2, e-cadherin positive. Prognostic indicators significant for: estrogen receptor, 95% positive and progesterone receptor, 95% positive, both with strong staining intensity. Proliferation marker Ki67 at 30%. HER2 negative by immunohistochemistry (1+).  The biopsied lymph node was positive for metastatic carcinoma.  The patient's subsequent history is as detailed below.   PAST MEDICAL HISTORY: Past Medical History:  Diagnosis Date   Atrial septal defect    repaired age 30   Cancer ([place]) 01/2020   IDC left breast   GERD (gastroesophageal reflux disease)    Glaucoma    Headache    migraines until age 22- 38, have them 3-4 x year   History of blood transfusion    with ASD surgery at age 72   History of kidney stones    passed stones   Hypertension    Personal history of chemotherapy    Personal history of radiation therapy    Vitamin D deficiency     PAST SURGICAL HISTORY: Past Surgical History:  Procedure Laterality Date   ASD REPAIR     age 10   BREAST LUMPECTOMY     BREAST LUMPECTOMY WITH RADIOACTIVE SEED AND AXILLARY LYMPH NODE DISSECTION Left 01/12/2020   Procedure: LEFT BREAST LUMPECTOMY WITH RADIOACTIVE SEED AND LEFT AXILLARY LYMPH NODE DISSECTION;  Surgeon: Alphonsa Overall, MD;  Location: [facility];  Service: General;  Laterality: Left;  PEC BLOCK   COLONOSCOPY     IR REMOVAL TUN ACCESS W/ PORT W/O FL MOD SED  04/10/2021   PORTACATH  PLACEMENT Right 02/02/2020   Procedure: INSERTION PORT-A-CATH WITH ULTRASOUND GUIDANCE;  Surgeon: Alphonsa Overall, MD;  Location: [facility];  Service: General;  Laterality: Right;   UPPER GI ENDOSCOPY      FAMILY HISTORY: Family History  Problem Relation Age of Onset   Dementia Mother    Heart attack Father    Hypertension Father     Breast cancer Sister 73  The patient's father died at age 68 from a myocardial infarction.  The patient's mother died at 56 with Alzheimer's disease.  The patient has 2 brothers, 2 sisters.  1 sister developed breast cancer at the age of 26.  The patient's daughter has a history of thyroid cancer.  She has not been genetically tested.  There is no other history of cancer in the family to the patient's knowledge.   GYNECOLOGIC HISTORY:  No LMP recorded (lmp unknown). Patient is postmenopausal. Menarche: 76 years old Age at first live birth: 76 years old [place] P 3 LMP age 59 Contraceptive approximately 2 years, with no complications HRT no  Hysterectomy?  No BSO?  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  Her husband Cynthia Vasquez (goes by [employer]") is also a retired Optometrist.  Son Cynthia Vasquez 58 lives in [place] and works for a company that processes linnen for hospitals, but he completed a masters in Chief Executive Officer and is looking for a different job; daughter Cynthia Vasquez lives in [place] and is a Oncologist; son Cynthia Vasquez lives in [place] and runs a business that puts antennaes on cell towers.  The patient has 8 grandchildren.  She attends our [employer].    ADVANCED DIRECTIVES: In the absence of any documents to the contrary the patient's husband is her healthcare power of attorney   HEALTH MAINTENANCE: Social History   Tobacco Use   Smoking status: Never   Smokeless tobacco: Never  Vaping Use   Vaping Use: Never used  Substance Use Topics   Alcohol use: No   Drug use: No     Colonoscopy: "Less than 10 years ago"; [facility]  PAP: Remote  Bone density: Never   No Known Allergies  Current Outpatient Medications  Medication Sig Dispense Refill   acetaminophen (TYLENOL) 500 MG tablet Take 1 tablet (500 mg total) by mouth every 8 (eight) hours as needed. Take with aleve 220 md 60 tablet 0    anastrozole (ARIMIDEX) 1 MG tablet Take 1 tablet (1 mg total) by mouth daily. 90 tablet 4   b complex vitamins tablet Take 1 tablet by mouth 3 (three) times a week.     calcium gluconate 500 MG tablet Take 1 tablet (500 mg total) by mouth 3 (three) times daily.     calcium gluconate 500 MG tablet Take 1 tablet (500 mg total) by mouth 3 (three) times daily.     cholecalciferol (VITAMIN D) 1000 UNITS tablet Take 1,000 Units by mouth daily.     hydrochlorothiazide (HYDRODIURIL) 25 MG tablet Take 1 tablet (25 mg total) by mouth daily. 90 tablet 0   latanoprost (XALATAN) 0.005 % ophthalmic solution Place 1 drop into both eyes at bedtime.     loratadine (CLARITIN) 10 MG tablet Take 1 tablet (10 mg total) by mouth daily. 90 tablet 4   losartan (COZAAR) 50 MG tablet Take 1 tablet (50 mg total) by mouth daily.     Multiple Vitamins-Minerals (MULTIVITAMIN WITH  MINERALS) tablet Take 1 tablet by mouth daily.     naproxen sodium (ALEVE) 220 MG tablet Take 1 tablet (220 mg total) by mouth every 8 (eight) hours as needed. Take with tylenol 500 mg. 60 tablet 3   pantoprazole (PROTONIX) 40 MG tablet Take 40 mg by mouth every other day.     Turmeric 500 MG TABS Take 1,500 mg by mouth daily.     No current facility-administered medications for this visit.    OBJECTIVE: White woman who appears stated age  There were no vitals filed for this visit.  Wt Readings from Last 3 Encounters:  07/07/21 139 lb 9.6 oz (63.3 kg)  04/10/21 133 lb (60.3 kg)  01/27/21 137 lb 1.6 oz (62.2 kg)   There is no height or weight on file to calculate BMI.    ECOG FS:1 - Symptomatic but completely ambulatory  Hair has grown back full, curly, salt-and-pepper Sclerae unicteric, EOMs intact Wearing a mask No cervical or supraclavicular adenopathy Lungs no rales or rhonchi Heart regular rate and rhythm Abd soft, nontender, positive bowel sounds MSK no focal spinal tenderness, no upper extremity lymphedema Neuro: nonfocal,  well oriented, appropriate affect Breasts: Bilateral breast examined.  Left breast with postradiation changes and some purity orange appearance in the inferior portion.  No palpable masses.  Some postop seroma in the left axilla which according to the patient has been previously aspirated and has not significantly changed in size.  Right breast normal to inspection and palpation.  LAB RESULTS:  CMP     Component Value Date/Time   NA 140 07/07/2021 0856   K 3.8 07/07/2021 0856   CL 102 07/07/2021 0856   CO2 28 07/07/2021 0856   GLUCOSE 92 07/07/2021 0856   BUN 26 (H) 07/07/2021 0856   CREATININE 1.11 (H) 07/07/2021 0856   CREATININE 1.06 (H) 11/29/2019 0845   CALCIUM 9.9 07/07/2021 0856   PROT 7.9 07/07/2021 0856   ALBUMIN 4.3 07/07/2021 0856   AST 24 07/07/2021 0856   AST 23 11/29/2019 0845   ALT 16 07/07/2021 0856   ALT 17 11/29/2019 0845   ALKPHOS 43 07/07/2021 0856   BILITOT 0.7 07/07/2021 0856   BILITOT 0.6 11/29/2019 0845   GFRNONAA 52 (L) 07/07/2021 0856   GFRNONAA 52 (L) 11/29/2019 0845   GFRAA 58 (L) 04/30/2020 1020   GFRAA >60 11/29/2019 0845    No results found for: TOTALPROTELP, ALBUMINELP, A1GS, A2GS, BETS, BETA2SER, GAMS, MSPIKE, SPEI  Lab Results  Component Value Date   WBC 7.3 07/07/2021   NEUTROABS 4.5 07/07/2021   HGB 12.5 07/07/2021   HCT 37.4 07/07/2021   MCV 89.9 07/07/2021   PLT 318 07/07/2021    No results found for: LABCA2  No components found for: JSRPRX458  No results for input(s): INR in the last 168 hours.  No results found for: LABCA2  No results found for: PFY924  No results found for: MQK863  No results found for: OTR711  Lab Results  Component Value Date   CA2729 10.4 02/13/2020    No components found for: HGQUANT  No results found for: CEA1 / No results found for: CEA1   No results found for: AFPTUMOR  No results found for: CHROMOGRNA  No results found for: KPAFRELGTCHN, LAMBDASER, KAPLAMBRATIO (kappa/lambda light  chains)  No results found for: HGBA, HGBA2QUANT, HGBFQUANT, HGBSQUAN (Hemoglobinopathy evaluation)   Lab Results  Component Value Date   LDH 230 (H) 06/28/2019    Lab Results  Component Value  Date   IRON 106 01/27/2021   TIBC 325 01/27/2021   IRONPCTSAT 33 01/27/2021   (Iron and TIBC)  Lab Results  Component Value Date   FERRITIN 309 (H) 01/27/2021    Urinalysis    Component Value Date/Time   COLORURINE YELLOW 06/11/2013 2007   APPEARANCEUR CLEAR 06/11/2013 2007   LABSPEC 1.017 06/11/2013 2007   PHURINE 6.5 06/11/2013 2007   GLUCOSEU NEGATIVE 06/11/2013 2007   HGBUR NEGATIVE 06/11/2013 2007   BILIRUBINUR NEGATIVE 06/11/2013 2007   KETONESUR NEGATIVE 06/11/2013 2007   PROTEINUR NEGATIVE 06/11/2013 2007   UROBILINOGEN 0.2 06/11/2013 2007   NITRITE NEGATIVE 06/11/2013 2007   LEUKOCYTESUR NEGATIVE 06/11/2013 2007    STUDIES: DG Bone Density  Result Date: 12/11/2021 EXAM: DUAL X-RAY ABSORPTIOMETRY (DXA) FOR BONE MINERAL DENSITY IMPRESSION: Referring Physician:  Chauncey Cruel Your patient completed a bone mineral density test using GE Lunar iDXA system (analysis version: 16). Technologist: [place] PATIENT: Name: Keron, Koffman Patient ID: [id number] Birth Date: 1946/03/27 Height: 59.0 in. Sex: Female Measured: 12/11/2021 Weight: 140.4 lbs. Indications: Advanced Age, Breast Cancer History, Caucasian, Estrogen Deficient, Postmenopausal Fractures: NONE Treatments: Calcium (E943.0), Vitamin D (E933.5) ASSESSMENT: The BMD measured at AP Spine L1-L2 is 1.076 g/cm2 with a T-score of -0.7. This patient is considered normal according to [facility]) criteria. The quality of the exam is good. L3, L4 was excluded due to degenerative changes. Site Region Measured Date Measured Age YA BMD Significant CHANGE T-score AP Spine  L1-L2      12/11/2021    75.4         -0.7    1.076 g/cm2 DualFemur Neck Left  12/11/2021    75.4         -0.2    1.004 g/cm2 DualFemur Total Mean  12/11/2021    75.4         0.9     1.118 g/cm2 World Health Organization [facility]) criteria for post-menopausal, Caucasian Women: Normal       T-score at or above -1 SD Osteopenia   T-score between -1 and -2.5 SD Osteoporosis T-score at or below -2.5 SD RECOMMENDATION: 1. All patients should optimize calcium and vitamin D intake. 2. Consider FDA-approved medical therapies in postmenopausal women and men aged 41 years and older, based on the following: a. A hip or vertebral (clinical or morphometric) fracture. b. T-score = -2.5 at the femoral neck or spine after appropriate evaluation to exclude secondary causes. c. Low bone mass (T-score between -1.0 and -2.5 at the femoral neck or spine) and a 10-year probability of a hip fracture = 3% or a 10-year probability of a major osteoporosis-related fracture = 20% based on the [place]-adapted WHO algorithm. d. Clinician judgment and/or patient preferences may indicate treatment for people with 10-year fracture probabilities above or below these levels. FOLLOW-UP: Patients with diagnosis of osteoporosis or at high risk for fracture should have regular bone mineral density tests.? Patients eligible for Medicare are allowed routine testing every 2 years.? The testing frequency can be increased to one year for patients who have rapidly progressing disease, are receiving or discontinuing medical therapy to restore bone mass, or have additional risk factors. I have reviewed this study and agree with the findings. [facility] Electronically Signed   By: Franki Cabot M.D.   On: 12/11/2021 11:36   MM DIAG BREAST TOMO BILATERAL  Result Date: 12/11/2021 CLINICAL DATA:  Status post left lumpectomy for breast cancer in June 2021 with radiation therapy  and chemotherapy. EXAM: DIGITAL DIAGNOSTIC BILATERAL MAMMOGRAM WITH TOMOSYNTHESIS AND CAD TECHNIQUE: Bilateral digital diagnostic mammography and breast tomosynthesis was performed. The images were evaluated with computer-aided  detection. COMPARISON:  Previous exam(s). ACR Breast Density Category c: The breast tissue is heterogeneously dense, which may obscure small masses. FINDINGS: Stable post lumpectomy and postradiation changes on the left. No interval findings suspicious for malignancy in either breast. IMPRESSION: No evidence of malignancy. RECOMMENDATION: Bilateral diagnostic mammogram in 1 year. I have discussed the findings and recommendations with the patient. If applicable, a reminder letter will be sent to the patient regarding the next appointment. BI-RADS CATEGORY  2: Benign. Electronically Signed   By: Claudie Revering M.D.   On: 12/11/2021 09:47    ELIGIBLE FOR AVAILABLE RESEARCH PROTOCOL: AET  ASSESSMENT: 76 y.o. [place] woman status post left breast upper outer quadrant biopsy 11/17/2019 for a clinical T2 N1, stage IIA invasive ductal carcinoma, grade 2, estrogen and progesterone receptor positive, HER-2 not amplified, with an MIB-1 of 30%.  (1) status post left lumpectomy and axillary lymph node dissection 01/12/2020 for a pT2 pN2, stage IIIA invasive ductal carcinoma, grade 3, with negative margins  (a) a total of 14 axillary lymph nodes removed, 8 positive, with extracapsular extension  (2) adjuvant chemotherapy consisting of doxorubicin and cyclophosphamide in dose dense fashion x4 started 02/13/2020, completed 04/10/2020, followed by weekly paclitaxel x12 started 04/30/2020, last dose 06/18/2020  (a) echocardiogram 02/08/2020 showed an ejection fraction in the 65/70%  (b) final 2 cycles of doxorubicin/cyclophosphamide given 21 days apart and 18% dose reduced  (c) final 4 doses of paclitaxel omitted secondary to fatigue and neuropathy  (3) adjuvant radiation 07/18/2020 through 09/05/2020 Site Technique Total Dose (Gy) Dose per Fx (Gy) Completed Fx Beam Energies  Breast, Left: Breast_Lt 3D 50.4/50.4 1.8 28/28 6X, 10X  Breast, Left: Breast_Lt_SCLV 3D 50.4/50.4 1.8 28/28 6X, 10X  Breast, Left:  Breast_Lt_Bst 3D 10/10 2 5/5 6X, 10X   (4) anastrozole prescribed January 2022 but not started by the patient at that time  (a) bone density ordered for November 2022 but not scheduled  PLAN: Patient has still not started anastrozole.  The first thing she tells me was that she does not recognize that medication on her medication reconciliation which he is what she exactly described to Dr. Jana Vasquez last year. She otherwise denies any changes in her health.  Last mammogram was unremarkable. Physical examination today without any concerning changes.  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 I have once again discussed the importance of starting antiestrogen therapy since she is a very high risk for recurrence and distant metastatic disease.  We have once again discussed the mechanism of action, adverse effects including but not limited to hot flashes, arthralgias, postmenopausal symptoms and bone loss. She agreed to try the medication.  She will return to clinic in 6 months. She requested a new prescription and this has been sent to the pharmacy of her choice.  Total time spent: 30 minutes  *Total Encounter Time as defined by the Centers for Medicare and Medicaid Services includes, in addition to the face-to-face time of a patient visit (documented in the note above) non-face-to-face time: obtaining and reviewing outside history, ordering and reviewing medications, tests or procedures, care coordination (communications with other health care professionals or caregivers) and documentation in the medical record.

## 2022-02-05 ENCOUNTER — Other Ambulatory Visit: Payer: Self-pay | Admitting: Hematology and Oncology

## 2022-02-06 NOTE — Telephone Encounter (Signed)
This RN called pt to follow up on medication refill for HCTZ- noted prior oncologist ordered remotely in 2022.  Cynthia Vasquez states prescription should have gone to her primary MD- verified as DO [place].  Refill sent to above.

## 2022-02-10 DIAGNOSIS — E46 Unspecified protein-calorie malnutrition: Secondary | ICD-10-CM | POA: Diagnosis not present

## 2022-02-10 DIAGNOSIS — I700 Atherosclerosis of aorta: Secondary | ICD-10-CM | POA: Diagnosis not present

## 2022-02-10 DIAGNOSIS — M7120 Synovial cyst of popliteal space [Baker], unspecified knee: Secondary | ICD-10-CM | POA: Diagnosis not present

## 2022-02-10 DIAGNOSIS — R634 Abnormal weight loss: Secondary | ICD-10-CM | POA: Diagnosis not present

## 2022-02-10 DIAGNOSIS — C50412 Malignant neoplasm of upper-outer quadrant of left female breast: Secondary | ICD-10-CM | POA: Diagnosis not present

## 2022-02-10 DIAGNOSIS — I10 Essential (primary) hypertension: Secondary | ICD-10-CM | POA: Diagnosis not present

## 2022-02-10 DIAGNOSIS — D649 Anemia, unspecified: Secondary | ICD-10-CM | POA: Diagnosis not present

## 2022-02-10 DIAGNOSIS — N1831 Chronic kidney disease, stage 3a: Secondary | ICD-10-CM | POA: Diagnosis not present

## 2022-02-10 DIAGNOSIS — Z23 Encounter for immunization: Secondary | ICD-10-CM | POA: Diagnosis not present

## 2022-02-10 DIAGNOSIS — R7989 Other specified abnormal findings of blood chemistry: Secondary | ICD-10-CM | POA: Diagnosis not present

## 2022-02-12 ENCOUNTER — Other Ambulatory Visit (HOSPITAL_COMMUNITY): Payer: Self-pay | Admitting: Internal Medicine

## 2022-02-12 ENCOUNTER — Ambulatory Visit (HOSPITAL_COMMUNITY)
Admission: RE | Admit: 2022-02-12 | Discharge: 2022-02-12 | Disposition: A | Discharge status: Home / Self Care | Payer: Medicare HMO | Source: Ambulatory Visit | Attending: Internal Medicine | Admitting: Internal Medicine

## 2022-02-12 DIAGNOSIS — M79661 Pain in right lower leg: Secondary | ICD-10-CM

## 2022-02-12 DIAGNOSIS — M79605 Pain in left leg: Secondary | ICD-10-CM

## 2022-02-12 DIAGNOSIS — M7989 Other specified soft tissue disorders: Secondary | ICD-10-CM | POA: Insufficient documentation

## 2022-03-04 DIAGNOSIS — M25562 Pain in left knee: Secondary | ICD-10-CM | POA: Diagnosis not present

## 2022-03-04 DIAGNOSIS — M7121 Synovial cyst of popliteal space [Baker], right knee: Secondary | ICD-10-CM | POA: Diagnosis not present

## 2022-03-04 DIAGNOSIS — M25561 Pain in right knee: Secondary | ICD-10-CM | POA: Diagnosis not present

## 2022-03-04 DIAGNOSIS — M7122 Synovial cyst of popliteal space [Baker], left knee: Secondary | ICD-10-CM | POA: Diagnosis not present

## 2022-06-19 DIAGNOSIS — H04123 Dry eye syndrome of bilateral lacrimal glands: Secondary | ICD-10-CM | POA: Diagnosis not present

## 2022-06-19 DIAGNOSIS — H401131 Primary open-angle glaucoma, bilateral, mild stage: Secondary | ICD-10-CM | POA: Diagnosis not present

## 2022-07-10 ENCOUNTER — Other Ambulatory Visit: Payer: Self-pay | Admitting: *Deleted

## 2022-07-10 DIAGNOSIS — Z170 Estrogen receptor positive status [ER+]: Secondary | ICD-10-CM

## 2022-07-13 ENCOUNTER — Other Ambulatory Visit: Payer: Self-pay

## 2022-07-13 ENCOUNTER — Inpatient Hospital Stay: Payer: Medicare HMO | Admitting: Hematology and Oncology

## 2022-07-13 ENCOUNTER — Inpatient Hospital Stay: Payer: Medicare HMO | Attending: Hematology and Oncology

## 2022-07-13 ENCOUNTER — Encounter: Payer: Self-pay | Admitting: Hematology and Oncology

## 2022-07-13 VITALS — BP 136/54 | HR 77 | Temp 97.7°F | Resp 18 | Ht 59.0 in | Wt 142.9 lb

## 2022-07-13 DIAGNOSIS — Z808 Family history of malignant neoplasm of other organs or systems: Secondary | ICD-10-CM | POA: Diagnosis not present

## 2022-07-13 DIAGNOSIS — R0789 Other chest pain: Secondary | ICD-10-CM | POA: Insufficient documentation

## 2022-07-13 DIAGNOSIS — Z8249 Family history of ischemic heart disease and other diseases of the circulatory system: Secondary | ICD-10-CM | POA: Insufficient documentation

## 2022-07-13 DIAGNOSIS — R252 Cramp and spasm: Secondary | ICD-10-CM | POA: Insufficient documentation

## 2022-07-13 DIAGNOSIS — Z9221 Personal history of antineoplastic chemotherapy: Secondary | ICD-10-CM | POA: Insufficient documentation

## 2022-07-13 DIAGNOSIS — Z79899 Other long term (current) drug therapy: Secondary | ICD-10-CM | POA: Insufficient documentation

## 2022-07-13 DIAGNOSIS — Z170 Estrogen receptor positive status [ER+]: Secondary | ICD-10-CM

## 2022-07-13 DIAGNOSIS — Z79811 Long term (current) use of aromatase inhibitors: Secondary | ICD-10-CM | POA: Diagnosis not present

## 2022-07-13 DIAGNOSIS — Z803 Family history of malignant neoplasm of breast: Secondary | ICD-10-CM | POA: Insufficient documentation

## 2022-07-13 DIAGNOSIS — Z87442 Personal history of urinary calculi: Secondary | ICD-10-CM | POA: Insufficient documentation

## 2022-07-13 DIAGNOSIS — C50412 Malignant neoplasm of upper-outer quadrant of left female breast: Secondary | ICD-10-CM | POA: Insufficient documentation

## 2022-07-13 DIAGNOSIS — C773 Secondary and unspecified malignant neoplasm of axilla and upper limb lymph nodes: Secondary | ICD-10-CM | POA: Insufficient documentation

## 2022-07-13 DIAGNOSIS — Z818 Family history of other mental and behavioral disorders: Secondary | ICD-10-CM | POA: Diagnosis not present

## 2022-07-13 DIAGNOSIS — Z923 Personal history of irradiation: Secondary | ICD-10-CM | POA: Diagnosis not present

## 2022-07-13 DIAGNOSIS — R5383 Other fatigue: Secondary | ICD-10-CM | POA: Insufficient documentation

## 2022-07-13 DIAGNOSIS — G629 Polyneuropathy, unspecified: Secondary | ICD-10-CM | POA: Diagnosis not present

## 2022-07-13 LAB — CMP (CANCER CENTER ONLY)
ALT: 13 U/L (ref 0–44)
AST: 22 U/L (ref 15–41)
Albumin: 4.5 g/dL (ref 3.5–5.0)
Alkaline Phosphatase: 39 U/L (ref 38–126)
Anion gap: 6 (ref 5–15)
BUN: 27 mg/dL — ABNORMAL HIGH (ref 8–23)
CO2: 31 mmol/L (ref 22–32)
Calcium: 10.7 mg/dL — ABNORMAL HIGH (ref 8.9–10.3)
Chloride: 103 mmol/L (ref 98–111)
Creatinine: 1.02 mg/dL — ABNORMAL HIGH (ref 0.44–1.00)
GFR, Estimated: 57 mL/min — ABNORMAL LOW (ref >60)
Glucose, Bld: 92 mg/dL (ref 70–99)
Potassium: 4.4 mmol/L (ref 3.5–5.1)
Sodium: 140 mmol/L (ref 135–145)
Total Bilirubin: 0.8 mg/dL (ref 0.3–1.2)
Total Protein: 7.6 g/dL (ref 6.5–8.1)

## 2022-07-13 LAB — CBC WITH DIFFERENTIAL (CANCER CENTER ONLY)
Abs Immature Granulocytes: 0.02 10*3/uL (ref 0.00–0.07)
Basophils Absolute: 0.1 10*3/uL (ref 0.0–0.1)
Basophils Relative: 1 %
Eosinophils Absolute: 0.3 10*3/uL (ref 0.0–0.5)
Eosinophils Relative: 6 %
HCT: 36.6 % (ref 36.0–46.0)
Hemoglobin: 12.4 g/dL (ref 12.0–15.0)
Immature Granulocytes: 0 %
Lymphocytes Relative: 24 %
Lymphs Abs: 1.2 10*3/uL (ref 0.7–4.0)
MCH: 30.8 pg (ref 26.0–34.0)
MCHC: 33.9 g/dL (ref 30.0–36.0)
MCV: 90.8 fL (ref 80.0–100.0)
Monocytes Absolute: 0.5 10*3/uL (ref 0.1–1.0)
Monocytes Relative: 10 %
Neutro Abs: 3 10*3/uL (ref 1.7–7.7)
Neutrophils Relative %: 59 %
Platelet Count: 203 10*3/uL (ref 150–400)
RBC: 4.03 MIL/uL (ref 3.87–5.11)
RDW: 13.3 % (ref 11.5–15.5)
WBC Count: 5.1 10*3/uL (ref 4.0–10.5)
nRBC: 0 % (ref 0.0–0.2)

## 2022-07-13 NOTE — Progress Notes (Signed)
Cynthia Vasquez  [phone] [phone]) [phone]    ID: TORIANNE LAFLAM DOB: Jul 10, 1946  MR#: [id number]  GGY#:[id number]  Patient Care Team: Sueanne Margarita, DO as PCP - General (Internal Medicine) Rockwell Germany, RN as Oncology Nurse Navigator Mauro Kaufmann, RN as Oncology Nurse Navigator Magrinat, Virgie Dad, MD (Inactive) as Consulting Physician (Oncology) Alphonsa Overall, MD as Consulting Physician (General Surgery) Kyung Rudd, MD as Consulting Physician (Radiation Oncology) Wilford Corner, MD as Consulting Physician (Gastroenterology) Benay Pike, MD  CHIEF COMPLAINT: Estrogen receptor positive breast cancer  CURRENT TREATMENT: Anastrozole  INTERVAL HISTORY:  Cynthia Vasquez returns today for follow-up of her estrogen receptor positive breast cancer.   Since last visit, she has started taking anastrozole back in June 2023.  She is actually tolerating it really really well.  She denies any adverse effects except for some mild hair thinning.  She has noticed some muscle cramps that come in the left chest wall from time to time they can be severe enough to take her breath away.  These lasted very short amount of time and resolved.  She also had bone density in May 2023 and wondered about the results. Besides the left chest wall pain, she has not noticed any changes in her breast.  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  Rest of the pertinent 10 point ROS reviewed and negative  REVIEW OF SYSTEMS:    COVID 19 VACCINATION STATUS: Had COVID 2020; status post [place] x2, as of December 2022  HISTORY OF CURRENT ILLNESS: From the original intake note:  Reighn herself palpated a left breast mass, with associated dimpling, tenderness, and pinching pain. She underwent bilateral diagnostic mammography with tomography and left breast ultrasonography at [facility] on 11/09/2019 showing: breast density category C; 2.9 cm irregular  mass in left breast at 2:30 corresponding to palpable abnormality; three enlarged left axillary lymph nodes.  Accordingly on 11/17/2019 she proceeded to biopsy of the left breast area in question. The pathology from this procedure ([id number]) showed: invasive mammary carcinoma, grade 2, e-cadherin positive. Prognostic indicators significant for: estrogen receptor, 95% positive and progesterone receptor, 95% positive, both with strong staining intensity. Proliferation marker Ki67 at 30%. HER2 negative by immunohistochemistry (1+).  The biopsied lymph node was positive for metastatic carcinoma.  The patient's subsequent history is as detailed below.   PAST MEDICAL HISTORY: Past Medical History:  Diagnosis Date   Atrial septal defect    repaired age 83   Cancer ([place]) 01/2020   IDC left breast   GERD (gastroesophageal reflux disease)    Glaucoma    Headache    migraines until age 42- 67, have them 3-4 x year   History of blood transfusion    with ASD surgery at age 47   History of kidney stones    passed stones   Hypertension    Personal history of chemotherapy    Personal history of radiation therapy    Vitamin D deficiency     PAST SURGICAL HISTORY: Past Surgical History:  Procedure Laterality Date   ASD REPAIR     age 55   BREAST LUMPECTOMY     BREAST LUMPECTOMY WITH RADIOACTIVE SEED AND AXILLARY LYMPH NODE DISSECTION Left 01/12/2020   Procedure: LEFT BREAST LUMPECTOMY WITH RADIOACTIVE SEED AND LEFT AXILLARY LYMPH NODE DISSECTION;  Surgeon: Alphonsa Overall, MD;  Location: [facility];  Service: General;  Laterality: Left;  PEC BLOCK   COLONOSCOPY  IR REMOVAL TUN ACCESS W/ PORT W/O FL MOD SED  04/10/2021   PORTACATH PLACEMENT Right 02/02/2020   Procedure: INSERTION PORT-A-CATH WITH ULTRASOUND GUIDANCE;  Surgeon: Alphonsa Overall, MD;  Location: [facility];  Service: General;  Laterality: Right;   UPPER GI ENDOSCOPY      FAMILY HISTORY: Family History  Problem Relation  Age of Onset   Dementia Mother    Heart attack Father    Hypertension Father    Breast cancer Sister 78  The patient's father died at age 93 from a myocardial infarction.  The patient's mother died at 13 with Alzheimer's disease.  The patient has 2 brothers, 2 sisters.  1 sister developed breast cancer at the age of 68.  The patient's daughter has a history of thyroid cancer.  She has not been genetically tested.  There is no other history of cancer in the family to the patient's knowledge.   GYNECOLOGIC HISTORY:  No LMP recorded (lmp unknown). Patient is postmenopausal. Menarche: 76 years old Age at first live birth: 76 years old [place] P 3 LMP age 75 Contraceptive approximately 2 years, with no complications HRT no  Hysterectomy?  No BSO?  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  Her husband Cynthia Vasquez (goes by [employer]") is also a retired Optometrist.  Son Cynthia Vasquez 61 lives in [place] and works for a company that processes linnen for hospitals, but he completed a masters in Chief Executive Officer and is looking for a different job; daughter Cynthia Vasquez lives in [place] and is a Oncologist; son Cynthia Vasquez lives in [place] and runs a business that puts antennaes on cell towers.  The patient has 8 grandchildren.  She attends our [employer].    ADVANCED DIRECTIVES: In the absence of any documents to the contrary the patient's husband is her healthcare power of attorney   HEALTH MAINTENANCE: Social History   Tobacco Use   Smoking status: Never   Smokeless tobacco: Never  Vaping Use   Vaping Use: Never used  Substance Use Topics   Alcohol use: No   Drug use: No     Colonoscopy: "Less than 10 years ago"; [facility]  PAP: Remote  Bone density: Never   No Known Allergies  Current Outpatient Medications  Medication Sig Dispense Refill   acetaminophen (TYLENOL) 500 MG tablet Take 1 tablet (500 mg  total) by mouth every 8 (eight) hours as needed. Take with aleve 220 md 60 tablet 0   anastrozole (ARIMIDEX) 1 MG tablet Take 1 tablet (1 mg total) by mouth daily. 90 tablet 4   b complex vitamins tablet Take 1 tablet by mouth 3 (three) times a week.     calcium gluconate 500 MG tablet Take 1 tablet (500 mg total) by mouth 3 (three) times daily.     calcium gluconate 500 MG tablet Take 1 tablet (500 mg total) by mouth 3 (three) times daily.     cholecalciferol (VITAMIN D) 1000 UNITS tablet Take 1,000 Units by mouth daily.     hydrochlorothiazide (HYDRODIURIL) 25 MG tablet Take 1 tablet (25 mg total) by mouth daily. 90 tablet 0   latanoprost (XALATAN) 0.005 % ophthalmic solution Place 1 drop into both eyes at bedtime.     loratadine (CLARITIN) 10 MG tablet Take 1 tablet (10 mg total) by mouth daily. 90 tablet 4   losartan (COZAAR) 50 MG tablet Take 1  tablet (50 mg total) by mouth daily.     Multiple Vitamins-Minerals (MULTIVITAMIN WITH MINERALS) tablet Take 1 tablet by mouth daily.     naproxen sodium (ALEVE) 220 MG tablet Take 1 tablet (220 mg total) by mouth every 8 (eight) hours as needed. Take with tylenol 500 mg. 60 tablet 3   pantoprazole (PROTONIX) 40 MG tablet Take 40 mg by mouth every other day.     Turmeric 500 MG TABS Take 1,500 mg by mouth daily.     No current facility-administered medications for this visit.    OBJECTIVE: White woman who appears stated age  76:   07/13/22 0936  BP: (!) 136/54  Pulse: 77  Resp: 18  Temp: 97.7 F (36.5 C)  SpO2: 95%    Wt Readings from Last 3 Encounters:  07/13/22 142 lb 14.4 oz (64.8 kg)  01/06/22 141 lb 12.8 oz (64.3 kg)  07/07/21 139 lb 9.6 oz (63.3 kg)   Body mass index is 28.86 kg/m.    ECOG FS:1 - Symptomatic but completely ambulatory  Hair has grown back full, curly, salt-and-pepper Sclerae unicteric, EOMs intact Wearing a mask No cervical or supraclavicular adenopathy Lungs no rales or rhonchi Heart regular rate and  rhythm Abd soft, nontender, positive bowel sounds MSK no focal spinal tenderness, no upper extremity lymphedema Neuro: nonfocal, well oriented, appropriate affect Breasts: Bilateral breasts examined.  Left chest postlumpectomy and radiation.  Small left axillary post op seroma stable to smaller.  No other palpable masses.  No adenopathy  LAB RESULTS:  CMP     Component Value Date/Time   NA 138 01/06/2022 1514   K 4.5 01/06/2022 1514   CL 102 01/06/2022 1514   CO2 30 01/06/2022 1514   GLUCOSE 108 (H) 01/06/2022 1514   BUN 31 (H) 01/06/2022 1514   CREATININE 1.14 (H) 01/06/2022 1514   CALCIUM 9.9 01/06/2022 1514   PROT 7.2 01/06/2022 1514   ALBUMIN 4.3 01/06/2022 1514   AST 22 01/06/2022 1514   ALT 14 01/06/2022 1514   ALKPHOS 38 01/06/2022 1514   BILITOT 0.5 01/06/2022 1514   GFRNONAA 50 (L) 01/06/2022 1514   GFRAA 58 (L) 04/30/2020 1020   GFRAA >60 11/29/2019 0845    No results found for: "TOTALPROTELP", "ALBUMINELP", "A1GS", "A2GS", "BETS", "BETA2SER", "GAMS", "MSPIKE", "SPEI"  Lab Results  Component Value Date   WBC 5.1 07/13/2022   NEUTROABS 3.0 07/13/2022   HGB 12.4 07/13/2022   HCT 36.6 07/13/2022   MCV 90.8 07/13/2022   PLT 203 07/13/2022    No results found for: "LABCA2"  No components found for: "OMBTDH741"  No results for input(s): "INR" in the last 168 hours.  No results found for: "LABCA2"  No results found for: "ULA453"  No results found for: "CAN125"  No results found for: "CAN153"  Lab Results  Component Value Date   CA2729 10.4 02/13/2020    No components found for: "HGQUANT"  No results found for: "CEA1", "CEA" / No results found for: "CEA1", "CEA"   No results found for: "AFPTUMOR"  No results found for: "CHROMOGRNA"  No results found for: "KPAFRELGTCHN", "LAMBDASER", "KAPLAMBRATIO" (kappa/lambda light chains)  No results found for: "HGBA", "HGBA2QUANT", "HGBFQUANT", "HGBSQUAN" (Hemoglobinopathy evaluation)   Lab Results   Component Value Date   LDH 230 (H) 06/28/2019    Lab Results  Component Value Date   IRON 106 01/27/2021   TIBC 325 01/27/2021   IRONPCTSAT 33 01/27/2021   (Iron and TIBC)  Lab Results  Component  Value Date   FERRITIN 309 (H) 01/27/2021    Urinalysis    Component Value Date/Time   COLORURINE YELLOW 06/11/2013 2007   APPEARANCEUR CLEAR 06/11/2013 2007   LABSPEC 1.017 06/11/2013 2007   PHURINE 6.5 06/11/2013 2007   GLUCOSEU NEGATIVE 06/11/2013 2007   HGBUR NEGATIVE 06/11/2013 2007   [place] NEGATIVE 06/11/2013 2007   [place] NEGATIVE 06/11/2013 2007   PROTEINUR NEGATIVE 06/11/2013 2007   UROBILINOGEN 0.2 06/11/2013 2007   NITRITE NEGATIVE 06/11/2013 2007   LEUKOCYTESUR NEGATIVE 06/11/2013 2007    STUDIES: No results found.   ELIGIBLE FOR AVAILABLE RESEARCH PROTOCOL: AET  ASSESSMENT: 76 y.o. [place] woman status post left breast upper outer quadrant biopsy 11/17/2019 for a clinical T2 N1, stage IIA invasive ductal carcinoma, grade 2, estrogen and progesterone receptor positive, HER-2 not amplified, with an MIB-1 of 30%.  (1) status post left lumpectomy and axillary lymph node dissection 01/12/2020 for a pT2 pN2, stage IIIA invasive ductal carcinoma, grade 3, with negative margins  (a) a total of 14 axillary lymph nodes removed, 8 positive, with extracapsular extension  (2) adjuvant chemotherapy consisting of doxorubicin and cyclophosphamide in dose dense fashion x4 started 02/13/2020, completed 04/10/2020, followed by weekly paclitaxel x12 started 04/30/2020, last dose 06/18/2020  (a) echocardiogram 02/08/2020 showed an ejection fraction in the 65/70%  (b) final 2 cycles of doxorubicin/cyclophosphamide given 21 days apart and 18% dose reduced  (c) final 4 doses of paclitaxel omitted secondary to fatigue and neuropathy  (3) adjuvant radiation 07/18/2020 through 09/05/2020 Site Technique Total Dose (Gy) Dose per Fx (Gy) Completed Fx Beam Energies  Breast, Left:  Breast_Lt 3D 50.4/50.4 1.8 28/28 6X, 10X  Breast, Left: Breast_Lt_SCLV 3D 50.4/50.4 1.8 28/28 6X, 10X  Breast, Left: Breast_Lt_Bst 3D 10/10 2 5/5 6X, 10X   (4) anastrozole prescribed January 2022 but not started by the patient at that time  (a) bone density ordered for November 2022 but not scheduled  PLAN: Patient is tolerating anastrozole very well.  She started this in June 2020.  We will try to complete 5 years of antiestrogen therapy until June 2028. Mammogram in May 2023 with no findings of malignancy Bone density May 2023, normal. On physical exam, stable postop changes noted, no concerns for malignancy.  I encouraged her to start some physical activity of her upper body to help with the muscle cramps.  Otherwise she appears to be doing very well.  Hair thinning can indeed be related to anastrozole but this is not very bothersome to the patient. She will return to clinic in 6 months.  Mammogram ordered for May 2024. Total time spent: 30 minutes  *Total Encounter Time as defined by the Centers for Medicare and Medicaid Services includes, in addition to the face-to-face time of a patient visit (documented in the note above) non-face-to-face time: obtaining and reviewing outside history, ordering and reviewing medications, tests or procedures, care coordination (communications with other health care professionals or caregivers) and documentation in the medical record.

## 2022-08-13 DIAGNOSIS — D649 Anemia, unspecified: Secondary | ICD-10-CM | POA: Diagnosis not present

## 2022-08-13 DIAGNOSIS — I10 Essential (primary) hypertension: Secondary | ICD-10-CM | POA: Diagnosis not present

## 2022-08-13 DIAGNOSIS — I700 Atherosclerosis of aorta: Secondary | ICD-10-CM | POA: Diagnosis not present

## 2022-08-20 DIAGNOSIS — I10 Essential (primary) hypertension: Secondary | ICD-10-CM | POA: Diagnosis not present

## 2022-08-20 DIAGNOSIS — Z23 Encounter for immunization: Secondary | ICD-10-CM | POA: Diagnosis not present

## 2022-08-20 DIAGNOSIS — I700 Atherosclerosis of aorta: Secondary | ICD-10-CM | POA: Diagnosis not present

## 2022-08-20 DIAGNOSIS — C50412 Malignant neoplasm of upper-outer quadrant of left female breast: Secondary | ICD-10-CM | POA: Diagnosis not present

## 2022-08-20 DIAGNOSIS — R82998 Other abnormal findings in urine: Secondary | ICD-10-CM | POA: Diagnosis not present

## 2022-08-20 DIAGNOSIS — R634 Abnormal weight loss: Secondary | ICD-10-CM | POA: Diagnosis not present

## 2022-08-20 DIAGNOSIS — M7120 Synovial cyst of popliteal space [Baker], unspecified knee: Secondary | ICD-10-CM | POA: Diagnosis not present

## 2022-08-20 DIAGNOSIS — E46 Unspecified protein-calorie malnutrition: Secondary | ICD-10-CM | POA: Diagnosis not present

## 2022-08-20 DIAGNOSIS — N1831 Chronic kidney disease, stage 3a: Secondary | ICD-10-CM | POA: Diagnosis not present

## 2022-08-20 DIAGNOSIS — Z0000 Encounter for general adult medical examination without abnormal findings: Secondary | ICD-10-CM | POA: Diagnosis not present

## 2022-10-08 DIAGNOSIS — H401131 Primary open-angle glaucoma, bilateral, mild stage: Secondary | ICD-10-CM | POA: Diagnosis not present

## 2022-10-08 DIAGNOSIS — H52203 Unspecified astigmatism, bilateral: Secondary | ICD-10-CM | POA: Diagnosis not present

## 2022-11-19 DIAGNOSIS — M1711 Unilateral primary osteoarthritis, right knee: Secondary | ICD-10-CM | POA: Diagnosis not present

## 2022-11-19 DIAGNOSIS — Z6829 Body mass index (BMI) 29.0-29.9, adult: Secondary | ICD-10-CM | POA: Diagnosis not present

## 2022-11-19 DIAGNOSIS — K219 Gastro-esophageal reflux disease without esophagitis: Secondary | ICD-10-CM | POA: Diagnosis not present

## 2022-11-19 DIAGNOSIS — I129 Hypertensive chronic kidney disease with stage 1 through stage 4 chronic kidney disease, or unspecified chronic kidney disease: Secondary | ICD-10-CM | POA: Diagnosis not present

## 2022-11-19 DIAGNOSIS — M7061 Trochanteric bursitis, right hip: Secondary | ICD-10-CM | POA: Diagnosis not present

## 2022-11-19 DIAGNOSIS — N183 Chronic kidney disease, stage 3 unspecified: Secondary | ICD-10-CM | POA: Diagnosis not present

## 2022-11-19 DIAGNOSIS — I10 Essential (primary) hypertension: Secondary | ICD-10-CM | POA: Diagnosis not present

## 2022-11-20 ENCOUNTER — Encounter: Payer: Self-pay | Admitting: Family Medicine

## 2022-11-20 ENCOUNTER — Other Ambulatory Visit: Payer: Self-pay | Admitting: Family Medicine

## 2022-11-20 ENCOUNTER — Ambulatory Visit
Admission: RE | Admit: 2022-11-20 | Discharge: 2022-11-20 | Disposition: A | Discharge status: Home / Self Care | Payer: Medicare HMO | Source: Ambulatory Visit | Attending: Family Medicine | Admitting: Family Medicine

## 2022-11-20 DIAGNOSIS — M11261 Other chondrocalcinosis, right knee: Secondary | ICD-10-CM | POA: Diagnosis not present

## 2022-11-20 DIAGNOSIS — M1711 Unilateral primary osteoarthritis, right knee: Secondary | ICD-10-CM

## 2023-01-11 ENCOUNTER — Other Ambulatory Visit: Payer: Self-pay

## 2023-01-11 DIAGNOSIS — C50412 Malignant neoplasm of upper-outer quadrant of left female breast: Secondary | ICD-10-CM

## 2023-01-12 ENCOUNTER — Encounter: Payer: Self-pay | Admitting: Hematology and Oncology

## 2023-01-12 ENCOUNTER — Inpatient Hospital Stay: Payer: Medicare HMO | Attending: Hematology and Oncology

## 2023-01-12 ENCOUNTER — Inpatient Hospital Stay: Payer: Medicare HMO | Admitting: Hematology and Oncology

## 2023-01-12 ENCOUNTER — Other Ambulatory Visit: Payer: Self-pay

## 2023-01-12 VITALS — BP 144/67 | HR 66 | Temp 97.2°F | Resp 18 | Ht 59.0 in | Wt 141.9 lb

## 2023-01-12 DIAGNOSIS — C50412 Malignant neoplasm of upper-outer quadrant of left female breast: Secondary | ICD-10-CM | POA: Insufficient documentation

## 2023-01-12 DIAGNOSIS — C773 Secondary and unspecified malignant neoplasm of axilla and upper limb lymph nodes: Secondary | ICD-10-CM | POA: Diagnosis not present

## 2023-01-12 DIAGNOSIS — Z79899 Other long term (current) drug therapy: Secondary | ICD-10-CM | POA: Insufficient documentation

## 2023-01-12 DIAGNOSIS — Z923 Personal history of irradiation: Secondary | ICD-10-CM | POA: Diagnosis not present

## 2023-01-12 DIAGNOSIS — Z8249 Family history of ischemic heart disease and other diseases of the circulatory system: Secondary | ICD-10-CM | POA: Insufficient documentation

## 2023-01-12 DIAGNOSIS — Z803 Family history of malignant neoplasm of breast: Secondary | ICD-10-CM | POA: Diagnosis not present

## 2023-01-12 DIAGNOSIS — Z87442 Personal history of urinary calculi: Secondary | ICD-10-CM | POA: Diagnosis not present

## 2023-01-12 DIAGNOSIS — Z79811 Long term (current) use of aromatase inhibitors: Secondary | ICD-10-CM | POA: Insufficient documentation

## 2023-01-12 DIAGNOSIS — Z170 Estrogen receptor positive status [ER+]: Secondary | ICD-10-CM | POA: Insufficient documentation

## 2023-01-12 DIAGNOSIS — Z818 Family history of other mental and behavioral disorders: Secondary | ICD-10-CM | POA: Insufficient documentation

## 2023-01-12 DIAGNOSIS — Z9221 Personal history of antineoplastic chemotherapy: Secondary | ICD-10-CM | POA: Diagnosis not present

## 2023-01-12 DIAGNOSIS — G629 Polyneuropathy, unspecified: Secondary | ICD-10-CM | POA: Diagnosis not present

## 2023-01-12 LAB — CBC WITH DIFFERENTIAL (CANCER CENTER ONLY)
Abs Immature Granulocytes: 0.01 10*3/uL (ref 0.00–0.07)
Basophils Absolute: 0.1 10*3/uL (ref 0.0–0.1)
Basophils Relative: 1 %
Eosinophils Absolute: 0.3 10*3/uL (ref 0.0–0.5)
Eosinophils Relative: 6 %
HCT: 36.3 % (ref 36.0–46.0)
Hemoglobin: 12.5 g/dL (ref 12.0–15.0)
Immature Granulocytes: 0 %
Lymphocytes Relative: 26 %
Lymphs Abs: 1.5 10*3/uL (ref 0.7–4.0)
MCH: 30.9 pg (ref 26.0–34.0)
MCHC: 34.4 g/dL (ref 30.0–36.0)
MCV: 89.6 fL (ref 80.0–100.0)
Monocytes Absolute: 0.7 10*3/uL (ref 0.1–1.0)
Monocytes Relative: 13 %
Neutro Abs: 3.1 10*3/uL (ref 1.7–7.7)
Neutrophils Relative %: 54 %
Platelet Count: 278 10*3/uL (ref 150–400)
RBC: 4.05 MIL/uL (ref 3.87–5.11)
RDW: 13.5 % (ref 11.5–15.5)
WBC Count: 5.7 10*3/uL (ref 4.0–10.5)
nRBC: 0 % (ref 0.0–0.2)

## 2023-01-12 LAB — CMP (CANCER CENTER ONLY)
ALT: 15 U/L (ref 0–44)
AST: 22 U/L (ref 15–41)
Albumin: 4.5 g/dL (ref 3.5–5.0)
Alkaline Phosphatase: 36 U/L — ABNORMAL LOW (ref 38–126)
Anion gap: 6 (ref 5–15)
BUN: 41 mg/dL — ABNORMAL HIGH (ref 8–23)
CO2: 30 mmol/L (ref 22–32)
Calcium: 10.3 mg/dL (ref 8.9–10.3)
Chloride: 105 mmol/L (ref 98–111)
Creatinine: 1.13 mg/dL — ABNORMAL HIGH (ref 0.44–1.00)
GFR, Estimated: 50 mL/min — ABNORMAL LOW (ref >60)
Glucose, Bld: 96 mg/dL (ref 70–99)
Potassium: 4.5 mmol/L (ref 3.5–5.1)
Sodium: 141 mmol/L (ref 135–145)
Total Bilirubin: 0.8 mg/dL (ref 0.3–1.2)
Total Protein: 7.7 g/dL (ref 6.5–8.1)

## 2023-01-12 NOTE — Progress Notes (Signed)
[facility]  [phone] Fax:([phone]    ID: Cynthia Vasquez DOB: [birth year]  MR#: [id number]  JYN#:[id number]  Patient Care Team: Charlane Ferretti, DO as PCP - General (Internal Medicine) Donnelly Angelica, RN as Oncology Nurse Navigator Pershing Proud, RN as Oncology Nurse Navigator Magrinat, Valentino Hue, MD (Inactive) as Consulting Physician (Oncology) Ovidio Kin, MD as Consulting Physician (General Surgery) Dorothy Puffer, MD as Consulting Physician (Radiation Oncology) Charlott Rakes, MD as Consulting Physician (Gastroenterology) [name], MD  CHIEF COMPLAINT: Estrogen receptor positive breast cancer  CURRENT TREATMENT: Anastrozole  INTERVAL HISTORY:  Cynthia Vasquez returns today for follow-up of her estrogen receptor positive breast cancer.   Since last visit, she has started taking anastrozole back in June 2023.   She complains of right knee pain, radiating down to the leg.  She says many months ago she was trying to run to the church and tripped and fell on her right knee on the sidewalk.  Soon after that she started noticing this pain.  Otherwise she says leg cramps are not an issue anymore, hair thinning is not an issue anymore.  She says overall anastrozole is very tolerable.  No breast changes reported.  Mammogram scheduled for next week. Rest of the pertinent 10 point ROS reviewed and negative  REVIEW OF SYSTEMS:    COVID 19 VACCINATION STATUS: Had COVID 2020; status post Pfizer x2, as of December 2022  HISTORY OF CURRENT ILLNESS: From the original intake note:  Cynthia Vasquez herself palpated a left breast mass, with associated dimpling, tenderness, and pinching pain. She underwent bilateral diagnostic mammography with tomography and left breast ultrasonography at [facility] on 11/09/2019 showing: breast density category C; 2.9 cm irregular mass in left breast at 2:30 corresponding to palpable abnormality; three enlarged left axillary lymph  nodes.  Accordingly on 11/17/2019 she proceeded to biopsy of the left breast area in question. The pathology from this procedure ([id number]) showed: invasive mammary carcinoma, grade 2, e-cadherin positive. Prognostic indicators significant for: estrogen receptor, 95% positive and progesterone receptor, 95% positive, both with strong staining intensity. Proliferation marker Ki67 at 30%. HER2 negative by immunohistochemistry (1+).  The biopsied lymph node was positive for metastatic carcinoma.  The patient's subsequent history is as detailed below.   PAST MEDICAL HISTORY: Past Medical History:  Diagnosis Date   Atrial septal defect    repaired age 57   Cancer (HCC) 01/2020   IDC left breast   GERD (gastroesophageal reflux disease)    Glaucoma    Headache    migraines until age 23- 50, have them 3-4 x year   History of blood transfusion    with ASD surgery at age 24   History of kidney stones    passed stones   Hypertension    Personal history of chemotherapy    Personal history of radiation therapy    Vitamin D deficiency     PAST SURGICAL HISTORY: Past Surgical History:  Procedure Laterality Date   ASD REPAIR     age 62   BREAST LUMPECTOMY     BREAST LUMPECTOMY WITH RADIOACTIVE SEED AND AXILLARY LYMPH NODE DISSECTION Left 01/12/2020   Procedure: LEFT BREAST LUMPECTOMY WITH RADIOACTIVE SEED AND LEFT AXILLARY LYMPH NODE DISSECTION;  Surgeon: Ovidio Kin, MD;  Location: [facility] OR;  Service: General;  Laterality: Left;  PEC BLOCK   COLONOSCOPY     IR REMOVAL TUN ACCESS W/ PORT W/O FL MOD SED  04/10/2021   PORTACATH PLACEMENT Right 02/02/2020  Procedure: INSERTION PORT-A-CATH WITH ULTRASOUND GUIDANCE;  Surgeon: Ovidio Kin, MD;  Location: [facility];  Service: General;  Laterality: Right;   UPPER GI ENDOSCOPY      FAMILY HISTORY: Family History  Problem Relation Age of Onset   Dementia Mother    Heart attack Father    Hypertension Father    Breast cancer  Sister 4  The patient's father died at age 88 from a myocardial infarction.  The patient's mother died at 30 with Alzheimer's disease.  The patient has 2 brothers, 2 sisters.  1 sister developed breast cancer at the age of 53.  The patient's daughter has a history of thyroid cancer.  She has not been genetically tested.  There is no other history of cancer in the family to the patient's knowledge.   GYNECOLOGIC HISTORY:  No LMP recorded (lmp unknown). Patient is postmenopausal. Menarche: 77 years old Age at first live birth: 77 years old GX P 3 LMP age 56 Contraceptive approximately 2 years, with no complications HRT no  Hysterectomy?  No BSO?  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  She is now retired.  Her husband Cynthia Vasquez (goes by Cynthia Vasquez") is also a retired Airline pilot.  Son Cynthia Vasquez 46 lives in [place] and works for a company that processes linnen for hospitals, but he completed a masters in Data processing manager and is looking for a different job; daughter Cynthia Vasquez lives in [place] and is a Midwife; son Cynthia Vasquez lives in [place] and runs a business that puts antennaes on cell towers.  The patient has 8 grandchildren.  She attends our [employer].    ADVANCED DIRECTIVES: In the absence of any documents to the contrary the patient's husband is her healthcare power of attorney   HEALTH MAINTENANCE: Social History   Tobacco Use   Smoking status: Never   Smokeless tobacco: Never  Vaping Use   Vaping Use: Never used  Substance Use Topics   Alcohol use: No   Drug use: No     Colonoscopy: "Less than 10 years ago"; [facility]  PAP: Remote  Bone density: Never   No Known Allergies  Current Outpatient Medications  Medication Sig Dispense Refill   acetaminophen (TYLENOL) 500 MG tablet Take 1 tablet (500 mg total) by mouth every 8 (eight) hours as needed. Take with aleve 220 md 60 tablet 0   anastrozole  (ARIMIDEX) 1 MG tablet Take 1 tablet (1 mg total) by mouth daily. 90 tablet 4   b complex vitamins tablet Take 1 tablet by mouth 3 (three) times a week.     calcium gluconate 500 MG tablet Take 1 tablet (500 mg total) by mouth 3 (three) times daily.     calcium gluconate 500 MG tablet Take 1 tablet (500 mg total) by mouth 3 (three) times daily.     cholecalciferol (VITAMIN D) 1000 UNITS tablet Take 1,000 Units by mouth daily.     hydrochlorothiazide (HYDRODIURIL) 25 MG tablet Take 1 tablet (25 mg total) by mouth daily. 90 tablet 0   latanoprost (XALATAN) 0.005 % ophthalmic solution Place 1 drop into both eyes at bedtime.     loratadine (CLARITIN) 10 MG tablet Take 1 tablet (10 mg total) by mouth daily. 90 tablet 4   losartan (COZAAR) 50 MG tablet Take 1 tablet (50 mg total) by mouth daily.     Multiple Vitamins-Minerals (MULTIVITAMIN WITH MINERALS) tablet Take 1 tablet  by mouth daily.     naproxen sodium (ALEVE) 220 MG tablet Take 1 tablet (220 mg total) by mouth every 8 (eight) hours as needed. Take with tylenol 500 mg. 60 tablet 3   pantoprazole (PROTONIX) 40 MG tablet Take 40 mg by mouth every other day.     Turmeric 500 MG TABS Take 1,500 mg by mouth daily.     No current facility-administered medications for this visit.    OBJECTIVE: White woman who appears stated age  There were no vitals filed for this visit.   Wt Readings from Last 3 Encounters:  07/13/22 142 lb 14.4 oz (64.8 kg)  01/06/22 141 lb 12.8 oz (64.3 kg)  07/07/21 139 lb 9.6 oz (63.3 kg)   There is no height or weight on file to calculate BMI.    ECOG FS:1 - Symptomatic but completely ambulatory  General appearance: Alert, oriented and in no acute distress Neck: No cervical adenopathy Breasts: Bilateral breasts examined.  Left chest postlumpectomy and radiation.  Small left axillary post op seroma stable.  No other palpable masses.  No adenopathy Legs: No lower extremity edema  LAB RESULTS:  CMP     Component  Value Date/Time   NA 140 07/13/2022 0918   K 4.4 07/13/2022 0918   CL 103 07/13/2022 0918   CO2 31 07/13/2022 0918   GLUCOSE 92 07/13/2022 0918   BUN 27 (H) 07/13/2022 0918   CREATININE 1.02 (H) 07/13/2022 0918   CALCIUM 10.7 (H) 07/13/2022 0918   PROT 7.6 07/13/2022 0918   ALBUMIN 4.5 07/13/2022 0918   AST 22 07/13/2022 0918   ALT 13 07/13/2022 0918   ALKPHOS 39 07/13/2022 0918   BILITOT 0.8 07/13/2022 0918   GFRNONAA 57 (L) 07/13/2022 0918   GFRAA 58 (L) 04/30/2020 1020   GFRAA >60 11/29/2019 0845    No results found for: "TOTALPROTELP", "ALBUMINELP", "A1GS", "A2GS", "BETS", "BETA2SER", "GAMS", "MSPIKE", "SPEI"  Lab Results  Component Value Date   WBC 5.7 01/12/2023   NEUTROABS 3.1 01/12/2023   HGB 12.5 01/12/2023   HCT 36.3 01/12/2023   MCV 89.6 01/12/2023   PLT 278 01/12/2023    No results found for: "LABCA2"  No components found for: "WUJWJX914"  No results for input(s): "INR" in the last 168 hours.  No results found for: "LABCA2"  No results found for: "NWG956"  No results found for: "CAN125"  No results found for: "CAN153"  Lab Results  Component Value Date   CA2729 10.4 02/13/2020    No components found for: "HGQUANT"  No results found for: "CEA1", "CEA" / No results found for: "CEA1", "CEA"   No results found for: "AFPTUMOR"  No results found for: "CHROMOGRNA"  No results found for: "KPAFRELGTCHN", "LAMBDASER", "KAPLAMBRATIO" (kappa/lambda light chains)  No results found for: "HGBA", "HGBA2QUANT", "HGBFQUANT", "HGBSQUAN" (Hemoglobinopathy evaluation)   Lab Results  Component Value Date   LDH 230 (H) 06/28/2019    Lab Results  Component Value Date   IRON 106 01/27/2021   TIBC 325 01/27/2021   IRONPCTSAT 33 01/27/2021   (Iron and TIBC)  Lab Results  Component Value Date   FERRITIN 309 (H) 01/27/2021    Urinalysis    Component Value Date/Time   COLORURINE YELLOW 06/11/2013 2007   APPEARANCEUR CLEAR 06/11/2013 2007   LABSPEC  1.017 06/11/2013 2007   PHURINE 6.5 06/11/2013 2007   GLUCOSEU NEGATIVE 06/11/2013 2007   HGBUR NEGATIVE 06/11/2013 2007   BILIRUBINUR NEGATIVE 06/11/2013 2007   KETONESUR NEGATIVE 06/11/2013 2007  PROTEINUR NEGATIVE 06/11/2013 2007   UROBILINOGEN 0.2 06/11/2013 2007   NITRITE NEGATIVE 06/11/2013 2007   LEUKOCYTESUR NEGATIVE 06/11/2013 2007    STUDIES: No results found.   ELIGIBLE FOR AVAILABLE RESEARCH PROTOCOL: AET  ASSESSMENT: 77 y.o. [place] woman status post left breast upper outer quadrant biopsy 11/17/2019 for a clinical T2 N1, stage IIA invasive ductal carcinoma, grade 2, estrogen and progesterone receptor positive, HER-2 not amplified, with an MIB-1 of 30%.  (1) status post left lumpectomy and axillary lymph node dissection 01/12/2020 for a pT2 pN2, stage IIIA invasive ductal carcinoma, grade 3, with negative margins  (a) a total of 14 axillary lymph nodes removed, 8 positive, with extracapsular extension  (2) adjuvant chemotherapy consisting of doxorubicin and cyclophosphamide in dose dense fashion x4 started 02/13/2020, completed 04/10/2020, followed by weekly paclitaxel x12 started 04/30/2020, last dose 06/18/2020  (a) echocardiogram 02/08/2020 showed an ejection fraction in the 65/70%  (b) final 2 cycles of doxorubicin/cyclophosphamide given 21 days apart and 18% dose reduced  (c) final 4 doses of paclitaxel omitted secondary to fatigue and neuropathy  (3) adjuvant radiation 07/18/2020 through 09/05/2020 Site Technique Total Dose (Gy) Dose per Fx (Gy) Completed Fx Beam Energies  Breast, Left: Breast_Lt 3D 50.4/50.4 1.8 28/28 6X, 10X  Breast, Left: Breast_Lt_SCLV 3D 50.4/50.4 1.8 28/28 6X, 10X  Breast, Left: Breast_Lt_Bst 3D 10/10 2 5/5 6X, 10X   (4) anastrozole prescribed January 2022 but not started by the patient at that time  (a) bone density ordered for November 2022 but not scheduled  PLAN:  Patient is tolerating anastrozole very well.  She started this in  June 2023.  We will try to complete 5 years of antiestrogen therapy until June 2028. Mammogram in May 2023 with no findings of malignancy Bone density May 2023, normal. No concerns of recurrence on exam today, continue anastrozole as prescribed.  With regards to the right knee pain secondary to trauma, I put in a referral for orthopedics. She will continue calcium and vitamin D, anastrozole and return to clinic in 1 year.  Mammogram is scheduled next week.  I encouraged exercising but she says she has no motive to do it, maybe she will consider it once the knee pain is better.  Total time spent: 30 minutes  *Total Encounter Time as defined by the Centers for Medicare and Medicaid Services includes, in addition to the face-to-face time of a patient visit (documented in the note above) non-face-to-face time: obtaining and reviewing outside history, ordering and reviewing medications, tests or procedures, care coordination (communications with other health care professionals or caregivers) and documentation in the medical record.

## 2023-01-13 NOTE — Progress Notes (Signed)
Called [facility] at [phone] seeking fax number for referral. Ortho referral faxed to [phone] with receipt confirmation.

## 2023-01-14 DIAGNOSIS — I10 Essential (primary) hypertension: Secondary | ICD-10-CM | POA: Diagnosis not present

## 2023-01-14 DIAGNOSIS — N1831 Chronic kidney disease, stage 3a: Secondary | ICD-10-CM | POA: Diagnosis not present

## 2023-01-18 ENCOUNTER — Ambulatory Visit
Admission: RE | Admit: 2023-01-18 | Discharge: 2023-01-18 | Disposition: A | Discharge status: Home / Self Care | Payer: Medicare HMO | Source: Ambulatory Visit | Attending: Hematology and Oncology | Admitting: Hematology and Oncology

## 2023-01-18 DIAGNOSIS — Z170 Estrogen receptor positive status [ER+]: Secondary | ICD-10-CM

## 2023-01-18 DIAGNOSIS — Z853 Personal history of malignant neoplasm of breast: Secondary | ICD-10-CM | POA: Diagnosis not present

## 2023-01-28 DIAGNOSIS — H00014 Hordeolum externum left upper eyelid: Secondary | ICD-10-CM | POA: Diagnosis not present

## 2023-02-23 DIAGNOSIS — H401131 Primary open-angle glaucoma, bilateral, mild stage: Secondary | ICD-10-CM | POA: Diagnosis not present

## 2023-02-23 DIAGNOSIS — H04123 Dry eye syndrome of bilateral lacrimal glands: Secondary | ICD-10-CM | POA: Diagnosis not present

## 2023-03-29 DIAGNOSIS — M25561 Pain in right knee: Secondary | ICD-10-CM | POA: Diagnosis not present

## 2023-04-03 ENCOUNTER — Other Ambulatory Visit: Payer: Self-pay | Admitting: Hematology and Oncology

## 2023-04-12 DIAGNOSIS — M25561 Pain in right knee: Secondary | ICD-10-CM | POA: Diagnosis not present

## 2023-04-19 DIAGNOSIS — M25561 Pain in right knee: Secondary | ICD-10-CM | POA: Diagnosis not present

## 2023-04-19 DIAGNOSIS — M25562 Pain in left knee: Secondary | ICD-10-CM | POA: Diagnosis not present

## 2023-07-05 ENCOUNTER — Other Ambulatory Visit: Payer: Self-pay | Admitting: Hematology and Oncology

## 2023-07-05 NOTE — Telephone Encounter (Signed)
Continue Anastrazole until June 2028. Lorayne Marek, RN

## 2023-07-09 DIAGNOSIS — H5213 Myopia, bilateral: Secondary | ICD-10-CM | POA: Diagnosis not present

## 2023-07-09 DIAGNOSIS — H401131 Primary open-angle glaucoma, bilateral, mild stage: Secondary | ICD-10-CM | POA: Diagnosis not present

## 2023-07-09 DIAGNOSIS — H2513 Age-related nuclear cataract, bilateral: Secondary | ICD-10-CM | POA: Diagnosis not present

## 2023-07-20 DIAGNOSIS — E559 Vitamin D deficiency, unspecified: Secondary | ICD-10-CM | POA: Diagnosis not present

## 2023-07-20 DIAGNOSIS — I10 Essential (primary) hypertension: Secondary | ICD-10-CM | POA: Diagnosis not present

## 2023-07-20 DIAGNOSIS — C50412 Malignant neoplasm of upper-outer quadrant of left female breast: Secondary | ICD-10-CM | POA: Diagnosis not present

## 2023-07-20 DIAGNOSIS — M25561 Pain in right knee: Secondary | ICD-10-CM | POA: Diagnosis not present

## 2023-07-20 DIAGNOSIS — G620 Drug-induced polyneuropathy: Secondary | ICD-10-CM | POA: Diagnosis not present

## 2023-07-20 DIAGNOSIS — H6123 Impacted cerumen, bilateral: Secondary | ICD-10-CM | POA: Diagnosis not present

## 2023-07-20 DIAGNOSIS — Z0000 Encounter for general adult medical examination without abnormal findings: Secondary | ICD-10-CM | POA: Diagnosis not present

## 2023-07-20 DIAGNOSIS — N1831 Chronic kidney disease, stage 3a: Secondary | ICD-10-CM | POA: Diagnosis not present

## 2023-07-20 DIAGNOSIS — Z23 Encounter for immunization: Secondary | ICD-10-CM | POA: Diagnosis not present

## 2023-08-17 DIAGNOSIS — M1611 Unilateral primary osteoarthritis, right hip: Secondary | ICD-10-CM | POA: Diagnosis not present

## 2023-08-17 DIAGNOSIS — M25561 Pain in right knee: Secondary | ICD-10-CM | POA: Diagnosis not present

## 2023-08-17 DIAGNOSIS — M1711 Unilateral primary osteoarthritis, right knee: Secondary | ICD-10-CM | POA: Diagnosis not present

## 2023-09-14 DIAGNOSIS — M25551 Pain in right hip: Secondary | ICD-10-CM | POA: Diagnosis not present

## 2023-09-23 DIAGNOSIS — M25551 Pain in right hip: Secondary | ICD-10-CM | POA: Diagnosis not present

## 2023-10-05 DIAGNOSIS — M5441 Lumbago with sciatica, right side: Secondary | ICD-10-CM | POA: Diagnosis not present

## 2023-10-05 DIAGNOSIS — M1611 Unilateral primary osteoarthritis, right hip: Secondary | ICD-10-CM | POA: Diagnosis not present

## 2023-10-07 ENCOUNTER — Other Ambulatory Visit: Payer: Self-pay | Admitting: Orthopedic Surgery

## 2023-10-07 DIAGNOSIS — M545 Low back pain, unspecified: Secondary | ICD-10-CM

## 2023-10-23 ENCOUNTER — Ambulatory Visit
Admission: RE | Admit: 2023-10-23 | Discharge: 2023-10-23 | Disposition: A | Discharge status: Home / Self Care | Source: Ambulatory Visit | Attending: Orthopedic Surgery | Admitting: Orthopedic Surgery

## 2023-10-23 DIAGNOSIS — M48061 Spinal stenosis, lumbar region without neurogenic claudication: Secondary | ICD-10-CM | POA: Diagnosis not present

## 2023-10-23 DIAGNOSIS — M4316 Spondylolisthesis, lumbar region: Secondary | ICD-10-CM | POA: Diagnosis not present

## 2023-10-23 DIAGNOSIS — M545 Low back pain, unspecified: Secondary | ICD-10-CM

## 2023-11-02 DIAGNOSIS — M5441 Lumbago with sciatica, right side: Secondary | ICD-10-CM | POA: Diagnosis not present

## 2023-11-02 DIAGNOSIS — M1611 Unilateral primary osteoarthritis, right hip: Secondary | ICD-10-CM | POA: Diagnosis not present

## 2023-11-18 DIAGNOSIS — H401131 Primary open-angle glaucoma, bilateral, mild stage: Secondary | ICD-10-CM | POA: Diagnosis not present

## 2024-01-12 ENCOUNTER — Other Ambulatory Visit: Payer: Self-pay | Admitting: *Deleted

## 2024-01-12 DIAGNOSIS — Z170 Estrogen receptor positive status [ER+]: Secondary | ICD-10-CM

## 2024-01-13 ENCOUNTER — Inpatient Hospital Stay: Payer: Medicare HMO | Admitting: Hematology and Oncology

## 2024-01-13 ENCOUNTER — Inpatient Hospital Stay: Payer: Medicare HMO | Attending: Hematology and Oncology

## 2024-01-13 ENCOUNTER — Encounter: Payer: Self-pay | Admitting: Oncology

## 2024-01-13 VITALS — BP 136/54 | HR 62 | Temp 98.9°F | Resp 17 | Wt 143.6 lb

## 2024-01-13 DIAGNOSIS — Z8249 Family history of ischemic heart disease and other diseases of the circulatory system: Secondary | ICD-10-CM | POA: Diagnosis not present

## 2024-01-13 DIAGNOSIS — Z79811 Long term (current) use of aromatase inhibitors: Secondary | ICD-10-CM | POA: Diagnosis not present

## 2024-01-13 DIAGNOSIS — Z9221 Personal history of antineoplastic chemotherapy: Secondary | ICD-10-CM | POA: Insufficient documentation

## 2024-01-13 DIAGNOSIS — C50412 Malignant neoplasm of upper-outer quadrant of left female breast: Secondary | ICD-10-CM | POA: Diagnosis not present

## 2024-01-13 DIAGNOSIS — Z803 Family history of malignant neoplasm of breast: Secondary | ICD-10-CM | POA: Diagnosis not present

## 2024-01-13 DIAGNOSIS — Z170 Estrogen receptor positive status [ER+]: Secondary | ICD-10-CM | POA: Diagnosis not present

## 2024-01-13 DIAGNOSIS — Z1721 Progesterone receptor positive status: Secondary | ICD-10-CM | POA: Diagnosis not present

## 2024-01-13 DIAGNOSIS — G629 Polyneuropathy, unspecified: Secondary | ICD-10-CM | POA: Diagnosis not present

## 2024-01-13 DIAGNOSIS — Z79899 Other long term (current) drug therapy: Secondary | ICD-10-CM | POA: Diagnosis not present

## 2024-01-13 DIAGNOSIS — Z1732 Human epidermal growth factor receptor 2 negative status: Secondary | ICD-10-CM | POA: Insufficient documentation

## 2024-01-13 DIAGNOSIS — Z808 Family history of malignant neoplasm of other organs or systems: Secondary | ICD-10-CM | POA: Diagnosis not present

## 2024-01-13 DIAGNOSIS — R590 Localized enlarged lymph nodes: Secondary | ICD-10-CM | POA: Diagnosis not present

## 2024-01-13 DIAGNOSIS — Z87442 Personal history of urinary calculi: Secondary | ICD-10-CM | POA: Insufficient documentation

## 2024-01-13 LAB — CBC WITH DIFFERENTIAL (CANCER CENTER ONLY)
Abs Immature Granulocytes: 0.01 10*3/uL (ref 0.00–0.07)
Basophils Absolute: 0.1 10*3/uL (ref 0.0–0.1)
Basophils Relative: 1 %
Eosinophils Absolute: 0.3 10*3/uL (ref 0.0–0.5)
Eosinophils Relative: 6 %
HCT: 34.9 % — ABNORMAL LOW (ref 36.0–46.0)
Hemoglobin: 12 g/dL (ref 12.0–15.0)
Immature Granulocytes: 0 %
Lymphocytes Relative: 31 %
Lymphs Abs: 1.7 10*3/uL (ref 0.7–4.0)
MCH: 30.5 pg (ref 26.0–34.0)
MCHC: 34.4 g/dL (ref 30.0–36.0)
MCV: 88.6 fL (ref 80.0–100.0)
Monocytes Absolute: 0.5 10*3/uL (ref 0.1–1.0)
Monocytes Relative: 10 %
Neutro Abs: 2.7 10*3/uL (ref 1.7–7.7)
Neutrophils Relative %: 52 %
Platelet Count: 277 10*3/uL (ref 150–400)
RBC: 3.94 MIL/uL (ref 3.87–5.11)
RDW: 13.4 % (ref 11.5–15.5)
WBC Count: 5.3 10*3/uL (ref 4.0–10.5)
nRBC: 0 % (ref 0.0–0.2)

## 2024-01-13 LAB — CMP (CANCER CENTER ONLY)
ALT: 13 U/L (ref 0–44)
AST: 23 U/L (ref 15–41)
Albumin: 4.6 g/dL (ref 3.5–5.0)
Alkaline Phosphatase: 33 U/L — ABNORMAL LOW (ref 38–126)
Anion gap: 8 (ref 5–15)
BUN: 36 mg/dL — ABNORMAL HIGH (ref 8–23)
CO2: 29 mmol/L (ref 22–32)
Calcium: 10.2 mg/dL (ref 8.9–10.3)
Chloride: 101 mmol/L (ref 98–111)
Creatinine: 1.14 mg/dL — ABNORMAL HIGH (ref 0.44–1.00)
GFR, Estimated: 50 mL/min — ABNORMAL LOW (ref >60)
Glucose, Bld: 86 mg/dL (ref 70–99)
Potassium: 4 mmol/L (ref 3.5–5.1)
Sodium: 138 mmol/L (ref 135–145)
Total Bilirubin: 0.7 mg/dL (ref 0.0–1.2)
Total Protein: 7.7 g/dL (ref 6.5–8.1)

## 2024-01-13 MED ORDER — ANASTROZOLE 1 MG PO TABS: 1.0000 mg | ORAL_TABLET | Freq: Every day | ORAL | 3 refills | Status: DC

## 2024-01-13 NOTE — Progress Notes (Signed)
 [facility]  [phone] [phone]    ID: Cynthia Vasquez DOB: 08-Mar-1946  MR#: [id number]  KGM#:[id number]  Patient Care Team: Windell Hasty, DO as PCP - General (Internal Medicine) Alane Hsu, RN as Oncology Nurse Navigator Auther Bo, RN as Oncology Nurse Navigator Juanita Norlander, MD as Consulting Physician (General Surgery) Johna Myers, MD as Consulting Physician (Radiation Oncology) Baldo Bonds, MD as Consulting Physician (Gastroenterology) Murleen Arms, MD as Consulting Physician (Hematology and Oncology) Murleen Arms, MD  CHIEF COMPLAINT: Estrogen receptor positive breast cancer  CURRENT TREATMENT: Anastrozole   INTERVAL HISTORY:  Carline returns today for follow-up of her estrogen receptor positive breast cancer.   She does not have any memory of taking anastrozole  currently.  It appears that she might have run out of this medication in March and has not resumed it.  She denies any new health changes.  She is excited that her grandson is graduating high school going to an assisted University soon.  She continues to have memory issues.  She says she does not remember most things correctly.  She is also due for mammogram soon which is here to be scheduled.  She denies any breast changes but she does not doing self exams.  She continues to complain of some discomfort in the left axilla which has been there since the time of the surgery.  Rest of the pertinent 10 point ROS reviewed and negative  REVIEW OF SYSTEMS:    COVID 19 VACCINATION STATUS: Had COVID 2020; status post Pfizer x2, as of December 2022  HISTORY OF CURRENT ILLNESS: From the original intake note:  Lanyia herself palpated a left breast mass, with associated dimpling, tenderness, and pinching pain. She underwent bilateral diagnostic mammography with tomography and left breast ultrasonography at [facility] on 11/09/2019 showing: breast density category C; 2.9  cm irregular mass in left breast at 2:30 corresponding to palpable abnormality; three enlarged left axillary lymph nodes.  Accordingly on 11/17/2019 she proceeded to biopsy of the left breast area in question. The pathology from this procedure ([id number]) showed: invasive mammary carcinoma, grade 2, e-cadherin positive. Prognostic indicators significant for: estrogen receptor, 95% positive and progesterone receptor, 95% positive, both with strong staining intensity. Proliferation marker Ki67 at 30%. HER2 negative by immunohistochemistry (1+).  The biopsied lymph node was positive for metastatic carcinoma.  The patient's subsequent history is as detailed below.   PAST MEDICAL HISTORY: Past Medical History:  Diagnosis Date   Atrial septal defect    repaired age 78   Cancer (HCC) 01/2020   IDC left breast   GERD (gastroesophageal reflux disease)    Glaucoma    Headache    migraines until age 78- 77, have them 3-4 x year   History of blood transfusion    with ASD surgery at age 78   History of kidney stones    passed stones   Hypertension    Personal history of chemotherapy    Personal history of radiation therapy    Vitamin D deficiency     PAST SURGICAL HISTORY: Past Surgical History:  Procedure Laterality Date   ASD REPAIR     age 78   BREAST LUMPECTOMY     BREAST LUMPECTOMY WITH RADIOACTIVE SEED AND AXILLARY LYMPH NODE DISSECTION Left 01/12/2020   Procedure: LEFT BREAST LUMPECTOMY WITH RADIOACTIVE SEED AND LEFT AXILLARY LYMPH NODE DISSECTION;  Surgeon: Juanita Norlander, MD;  Location: [facility] OR;  Service: General;  Laterality: Left;  PEC BLOCK  COLONOSCOPY     IR REMOVAL TUN ACCESS W/ PORT W/O FL MOD SED  04/10/2021   PORTACATH PLACEMENT Right 02/02/2020   Procedure: INSERTION PORT-A-CATH WITH ULTRASOUND GUIDANCE;  Surgeon: Juanita Norlander, MD;  Location: [facility];  Service: General;  Laterality: Right;   UPPER GI ENDOSCOPY      FAMILY HISTORY: Family History   Problem Relation Age of Onset   Dementia Mother    Heart attack Father    Hypertension Father    Breast cancer Sister 78  The patient's father died at age 81 from a myocardial infarction.  The patient's mother died at 56 with Alzheimer's disease.  The patient has 2 brothers, 2 sisters.  1 sister developed breast cancer at the age of 57.  The patient's daughter has a history of thyroid  cancer.  She has not been genetically tested.  There is no other history of cancer in the family to the patient's knowledge.   GYNECOLOGIC HISTORY:  No LMP recorded (lmp unknown). Patient is postmenopausal. Menarche: 78 years old Age at first live birth: 78 years old GX P 3 LMP age 59 Contraceptive approximately 2 years, with no complications HRT no  Hysterectomy?  No BSO?  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  She is now retired.  Her husband Drexel Gentles (goes by Asa Lauth) is also a retired Airline pilot.  Son Lyell Samuel 46 lives in [place]  and works for a company that processes linnen for hospitals, but he completed a masters in Data processing manager and is looking for a different job; daughter Candice Chalet lives in [place] and is a Midwife; son Mara Seminole lives in [place] and runs a business that puts antennaes on cell towers.  The patient has 8 grandchildren.  She attends our [employer].    ADVANCED DIRECTIVES: In the absence of any documents to the contrary the patient's husband is her healthcare power of attorney   HEALTH MAINTENANCE: Social History   Tobacco Use   Smoking status: Never   Smokeless tobacco: Never  Vaping Use   Vaping status: Never Used  Substance Use Topics   Alcohol use: No   Drug use: No     Colonoscopy: Less than 10 years ago; [facility]  PAP: Remote  Bone density: Never   No Known Allergies  Current Outpatient Medications  Medication Sig Dispense Refill   acetaminophen  (TYLENOL ) 500 MG tablet  Take 1 tablet (500 mg total) by mouth every 8 (eight) hours as needed. Take with aleve  220 md 60 tablet 0   anastrozole  (ARIMIDEX ) 1 MG tablet Take 1 tablet by mouth once daily 90 tablet 0   b complex vitamins tablet Take 1 tablet by mouth 3 (three) times a week.     calcium gluconate 500 MG tablet Take 1 tablet (500 mg total) by mouth 3 (three) times daily.     cholecalciferol  (VITAMIN D) 1000 UNITS tablet Take 1,000 Units by mouth daily.     hydrochlorothiazide  (HYDRODIURIL ) 25 MG tablet Take 1 tablet (25 mg total) by mouth daily. 90 tablet 0   latanoprost  (XALATAN ) 0.005 % ophthalmic solution Place 1 drop into both eyes at bedtime.     losartan  (COZAAR ) 50 MG tablet Take 1 tablet (50 mg total) by mouth daily.     Multiple Vitamins-Minerals (MULTIVITAMIN WITH MINERALS) tablet Take 1 tablet by mouth daily.     naproxen  sodium (ALEVE ) 220 MG tablet Take 1 tablet (220  mg total) by mouth every 8 (eight) hours as needed. Take with tylenol  500 mg. 60 tablet 3   pantoprazole  (PROTONIX ) 40 MG tablet Take 40 mg by mouth every other day.     Turmeric 500 MG TABS Take 1,500 mg by mouth daily.     No current facility-administered medications for this visit.    OBJECTIVE: White woman who appears stated age  There were no vitals filed for this visit.   Wt Readings from Last 3 Encounters:  01/12/23 141 lb 14.4 oz (64.4 kg)  07/13/22 142 lb 14.4 oz (64.8 kg)  01/06/22 141 lb 12.8 oz (64.3 kg)   There is no height or weight on file to calculate BMI.    ECOG FS:1 - Symptomatic but completely ambulatory  General appearance: Alert, oriented and in no acute distress Neck: No cervical adenopathy Breasts: Bilateral breasts examined.  Left chest postlumpectomy and radiation. No palpable masses. No adenopathy Legs: No lower extremity edema  LAB RESULTS:  CMP     Component Value Date/Time   NA 141 01/12/2023 0910   K 4.5 01/12/2023 0910   CL 105 01/12/2023 0910   CO2 30 01/12/2023 0910   GLUCOSE  96 01/12/2023 0910   BUN 41 (H) 01/12/2023 0910   CREATININE 1.13 (H) 01/12/2023 0910   CALCIUM 10.3 01/12/2023 0910   PROT 7.7 01/12/2023 0910   ALBUMIN 4.5 01/12/2023 0910   AST 22 01/12/2023 0910   ALT 15 01/12/2023 0910   ALKPHOS 36 (L) 01/12/2023 0910   BILITOT 0.8 01/12/2023 0910   GFRNONAA 50 (L) 01/12/2023 0910   GFRAA 58 (L) 04/30/2020 1020   GFRAA >60 11/29/2019 0845    No results found for: TOTALPROTELP, ALBUMINELP, A1GS, A2GS, BETS, BETA2SER, GAMS, MSPIKE, SPEI  Lab Results  Component Value Date   WBC 5.7 01/12/2023   NEUTROABS 3.1 01/12/2023   HGB 12.5 01/12/2023   HCT 36.3 01/12/2023   MCV 89.6 01/12/2023   PLT 278 01/12/2023    No results found for: LABCA2  No components found for: UUVOZD664  No results for input(s): INR in the last 168 hours.  No results found for: LABCA2  No results found for: QIH474  No results found for: QVZ563  No results found for: OVF643  Lab Results  Component Value Date   CA2729 10.4 02/13/2020    No components found for: HGQUANT  No results found for: CEA1, CEA / No results found for: CEA1, CEA   No results found for: AFPTUMOR  No results found for: CHROMOGRNA  No results found for: KPAFRELGTCHN, LAMBDASER, KAPLAMBRATIO (kappa/lambda light chains)  No results found for: HGBA, HGBA2QUANT, HGBFQUANT, HGBSQUAN (Hemoglobinopathy evaluation)   Lab Results  Component Value Date   LDH 230 (H) 06/28/2019    Lab Results  Component Value Date   IRON 106 01/27/2021   TIBC 325 01/27/2021   IRONPCTSAT 33 01/27/2021   (Iron and TIBC)  Lab Results  Component Value Date   FERRITIN 309 (H) 01/27/2021    Urinalysis    Component Value Date/Time   COLORURINE YELLOW 06/11/2013 2007   APPEARANCEUR CLEAR 06/11/2013 2007   LABSPEC 1.017 06/11/2013 2007   PHURINE 6.5 06/11/2013 2007   GLUCOSEU NEGATIVE 06/11/2013 2007   HGBUR NEGATIVE 06/11/2013 2007    BILIRUBINUR NEGATIVE 06/11/2013 2007   KETONESUR NEGATIVE 06/11/2013 2007   PROTEINUR NEGATIVE 06/11/2013 2007   UROBILINOGEN 0.2 06/11/2013 2007   NITRITE NEGATIVE 06/11/2013 2007   LEUKOCYTESUR NEGATIVE 06/11/2013 2007    STUDIES:  No results found.   ELIGIBLE FOR AVAILABLE RESEARCH PROTOCOL: AET  ASSESSMENT: 78 y.o. [place] woman status post left breast upper outer quadrant biopsy 11/17/2019 for a clinical T2 N1, stage IIA invasive ductal carcinoma, grade 2, estrogen and progesterone receptor positive, HER-2 not amplified, with an MIB-1 of 30%.  (1) status post left lumpectomy and axillary lymph node dissection 01/12/2020 for a pT2 pN2, stage IIIA invasive ductal carcinoma, grade 3, with negative margins  (a) a total of 14 axillary lymph nodes removed, 8 positive, with extracapsular extension  (2) adjuvant chemotherapy consisting of doxorubicin  and cyclophosphamide  in dose dense fashion x4 started 02/13/2020, completed 04/10/2020, followed by weekly paclitaxel  x12 started 04/30/2020, last dose 06/18/2020  (a) echocardiogram 02/08/2020 showed an ejection fraction in the 65/70%  (b) final 2 cycles of doxorubicin /cyclophosphamide  given 21 days apart and 18% dose reduced  (c) final 4 doses of paclitaxel  omitted secondary to fatigue and neuropathy  (3) adjuvant radiation 07/18/2020 through 09/05/2020 Site Technique Total Dose (Gy) Dose per Fx (Gy) Completed Fx Beam Energies  Breast, Left: Breast_Lt 3D 50.4/50.4 1.8 28/28 6X, 10X  Breast, Left: Breast_Lt_SCLV 3D 50.4/50.4 1.8 28/28 6X, 10X  Breast, Left: Breast_Lt_Bst 3D 10/10 2 5/5 6X, 10X   (4) anastrozole  prescribed January 2022 Started late Feb 2022  (a) bone density ordered for November 2022 but not scheduled  PLAN:  Ms. Schweigert is here for follow-up.  She has stopped taking her anastrozole  approximately in March when she ran out and did not reach out for another prescription.  She says she did not know that she should continue  it.  She appears to have started this in about February 22, plan is to continue it till at least May 2027.  No concerns on breast exam except postop changes in the left breast.  Mammogram due, this has been ordered, to be scheduled. Bone density May 2023, normal.  I will also order a repeat bone density. She will continue calcium and vitamin D, anastrozole  and return to clinic in 1 year.    Total time spent: 30 minutes  *Total Encounter Time as defined by the Centers for Medicare and Medicaid Services includes, in addition to the face-to-face time of a patient visit (documented in the note above) non-face-to-face time: obtaining and reviewing outside history, ordering and reviewing medications, tests or procedures, care coordination (communications with other health care professionals or caregivers) and documentation in the medical record.

## 2024-01-19 ENCOUNTER — Ambulatory Visit
Admission: RE | Admit: 2024-01-19 | Discharge: 2024-01-19 | Disposition: A | Discharge status: Home / Self Care | Source: Ambulatory Visit | Attending: Hematology and Oncology | Admitting: Hematology and Oncology

## 2024-01-19 DIAGNOSIS — Z853 Personal history of malignant neoplasm of breast: Secondary | ICD-10-CM | POA: Diagnosis not present

## 2024-01-19 DIAGNOSIS — Z170 Estrogen receptor positive status [ER+]: Secondary | ICD-10-CM

## 2024-01-19 DIAGNOSIS — Z08 Encounter for follow-up examination after completed treatment for malignant neoplasm: Secondary | ICD-10-CM | POA: Diagnosis not present

## 2024-01-20 DIAGNOSIS — M1611 Unilateral primary osteoarthritis, right hip: Secondary | ICD-10-CM | POA: Diagnosis not present

## 2024-01-24 DIAGNOSIS — N1831 Chronic kidney disease, stage 3a: Secondary | ICD-10-CM | POA: Diagnosis not present

## 2024-01-24 DIAGNOSIS — G620 Drug-induced polyneuropathy: Secondary | ICD-10-CM | POA: Diagnosis not present

## 2024-01-24 DIAGNOSIS — E7800 Pure hypercholesterolemia, unspecified: Secondary | ICD-10-CM | POA: Diagnosis not present

## 2024-01-24 DIAGNOSIS — E559 Vitamin D deficiency, unspecified: Secondary | ICD-10-CM | POA: Diagnosis not present

## 2024-01-24 DIAGNOSIS — C50412 Malignant neoplasm of upper-outer quadrant of left female breast: Secondary | ICD-10-CM | POA: Diagnosis not present

## 2024-01-24 DIAGNOSIS — I10 Essential (primary) hypertension: Secondary | ICD-10-CM | POA: Diagnosis not present

## 2024-01-24 DIAGNOSIS — Z170 Estrogen receptor positive status [ER+]: Secondary | ICD-10-CM | POA: Diagnosis not present

## 2024-02-17 DIAGNOSIS — M25661 Stiffness of right knee, not elsewhere classified: Secondary | ICD-10-CM | POA: Diagnosis not present

## 2024-02-17 DIAGNOSIS — R262 Difficulty in walking, not elsewhere classified: Secondary | ICD-10-CM | POA: Diagnosis not present

## 2024-02-17 DIAGNOSIS — M25651 Stiffness of right hip, not elsewhere classified: Secondary | ICD-10-CM | POA: Diagnosis not present

## 2024-03-06 DIAGNOSIS — M25551 Pain in right hip: Secondary | ICD-10-CM | POA: Diagnosis not present

## 2024-03-06 DIAGNOSIS — M1611 Unilateral primary osteoarthritis, right hip: Secondary | ICD-10-CM | POA: Diagnosis not present

## 2024-03-20 DIAGNOSIS — R55 Syncope and collapse: Secondary | ICD-10-CM | POA: Diagnosis not present

## 2024-03-20 DIAGNOSIS — H401131 Primary open-angle glaucoma, bilateral, mild stage: Secondary | ICD-10-CM | POA: Diagnosis not present

## 2024-03-20 DIAGNOSIS — I10 Essential (primary) hypertension: Secondary | ICD-10-CM | POA: Diagnosis not present

## 2024-03-20 DIAGNOSIS — R197 Diarrhea, unspecified: Secondary | ICD-10-CM | POA: Diagnosis not present

## 2024-03-20 DIAGNOSIS — H2513 Age-related nuclear cataract, bilateral: Secondary | ICD-10-CM | POA: Diagnosis not present

## 2024-03-22 DIAGNOSIS — Z96641 Presence of right artificial hip joint: Secondary | ICD-10-CM | POA: Diagnosis not present

## 2024-03-22 DIAGNOSIS — M1611 Unilateral primary osteoarthritis, right hip: Secondary | ICD-10-CM | POA: Diagnosis not present

## 2024-03-24 DIAGNOSIS — Z96641 Presence of right artificial hip joint: Secondary | ICD-10-CM | POA: Diagnosis not present

## 2024-03-24 DIAGNOSIS — M25651 Stiffness of right hip, not elsewhere classified: Secondary | ICD-10-CM | POA: Diagnosis not present

## 2024-03-27 DIAGNOSIS — M25651 Stiffness of right hip, not elsewhere classified: Secondary | ICD-10-CM | POA: Diagnosis not present

## 2024-04-01 ENCOUNTER — Encounter (HOSPITAL_BASED_OUTPATIENT_CLINIC_OR_DEPARTMENT_OTHER): Payer: Self-pay | Admitting: Emergency Medicine

## 2024-04-01 ENCOUNTER — Inpatient Hospital Stay (HOSPITAL_BASED_OUTPATIENT_CLINIC_OR_DEPARTMENT_OTHER)
Admission: EM | Admit: 2024-04-01 | Discharge: 2024-04-04 | DRG: 291 | Disposition: A | Discharge status: Home / Self Care | Attending: Internal Medicine | Admitting: Internal Medicine

## 2024-04-01 ENCOUNTER — Emergency Department (HOSPITAL_BASED_OUTPATIENT_CLINIC_OR_DEPARTMENT_OTHER)

## 2024-04-01 ENCOUNTER — Other Ambulatory Visit: Payer: Self-pay

## 2024-04-01 DIAGNOSIS — R079 Chest pain, unspecified: Secondary | ICD-10-CM

## 2024-04-01 DIAGNOSIS — I10 Essential (primary) hypertension: Secondary | ICD-10-CM | POA: Diagnosis present

## 2024-04-01 DIAGNOSIS — I951 Orthostatic hypotension: Secondary | ICD-10-CM | POA: Diagnosis present

## 2024-04-01 DIAGNOSIS — K219 Gastro-esophageal reflux disease without esophagitis: Secondary | ICD-10-CM | POA: Diagnosis not present

## 2024-04-01 DIAGNOSIS — Z923 Personal history of irradiation: Secondary | ICD-10-CM

## 2024-04-01 DIAGNOSIS — I110 Hypertensive heart disease with heart failure: Principal | ICD-10-CM | POA: Diagnosis present

## 2024-04-01 DIAGNOSIS — C50412 Malignant neoplasm of upper-outer quadrant of left female breast: Secondary | ICD-10-CM | POA: Diagnosis present

## 2024-04-01 DIAGNOSIS — D62 Acute posthemorrhagic anemia: Secondary | ICD-10-CM | POA: Diagnosis present

## 2024-04-01 DIAGNOSIS — J9601 Acute respiratory failure with hypoxia: Secondary | ICD-10-CM | POA: Diagnosis present

## 2024-04-01 DIAGNOSIS — Z96641 Presence of right artificial hip joint: Secondary | ICD-10-CM | POA: Diagnosis present

## 2024-04-01 DIAGNOSIS — J9600 Acute respiratory failure, unspecified whether with hypoxia or hypercapnia: Secondary | ICD-10-CM

## 2024-04-01 DIAGNOSIS — R0902 Hypoxemia: Principal | ICD-10-CM

## 2024-04-01 DIAGNOSIS — I5030 Unspecified diastolic (congestive) heart failure: Secondary | ICD-10-CM

## 2024-04-01 DIAGNOSIS — Z803 Family history of malignant neoplasm of breast: Secondary | ICD-10-CM | POA: Diagnosis not present

## 2024-04-01 DIAGNOSIS — R0789 Other chest pain: Secondary | ICD-10-CM | POA: Diagnosis not present

## 2024-04-01 DIAGNOSIS — Z8249 Family history of ischemic heart disease and other diseases of the circulatory system: Secondary | ICD-10-CM | POA: Diagnosis not present

## 2024-04-01 DIAGNOSIS — I509 Heart failure, unspecified: Secondary | ICD-10-CM | POA: Diagnosis not present

## 2024-04-01 DIAGNOSIS — J984 Other disorders of lung: Secondary | ICD-10-CM | POA: Diagnosis not present

## 2024-04-01 DIAGNOSIS — I5033 Acute on chronic diastolic (congestive) heart failure: Secondary | ICD-10-CM

## 2024-04-01 DIAGNOSIS — I5021 Acute systolic (congestive) heart failure: Secondary | ICD-10-CM | POA: Diagnosis not present

## 2024-04-01 DIAGNOSIS — N179 Acute kidney failure, unspecified: Secondary | ICD-10-CM | POA: Diagnosis present

## 2024-04-01 DIAGNOSIS — Z79899 Other long term (current) drug therapy: Secondary | ICD-10-CM

## 2024-04-01 DIAGNOSIS — J9811 Atelectasis: Secondary | ICD-10-CM | POA: Diagnosis not present

## 2024-04-01 DIAGNOSIS — D72829 Elevated white blood cell count, unspecified: Secondary | ICD-10-CM | POA: Diagnosis present

## 2024-04-01 DIAGNOSIS — Z79811 Long term (current) use of aromatase inhibitors: Secondary | ICD-10-CM | POA: Diagnosis not present

## 2024-04-01 DIAGNOSIS — I5031 Acute diastolic (congestive) heart failure: Secondary | ICD-10-CM | POA: Diagnosis present

## 2024-04-01 DIAGNOSIS — Z853 Personal history of malignant neoplasm of breast: Secondary | ICD-10-CM | POA: Diagnosis not present

## 2024-04-01 DIAGNOSIS — D509 Iron deficiency anemia, unspecified: Secondary | ICD-10-CM | POA: Diagnosis not present

## 2024-04-01 DIAGNOSIS — Z9221 Personal history of antineoplastic chemotherapy: Secondary | ICD-10-CM | POA: Diagnosis not present

## 2024-04-01 DIAGNOSIS — Z170 Estrogen receptor positive status [ER+]: Secondary | ICD-10-CM

## 2024-04-01 DIAGNOSIS — D649 Anemia, unspecified: Secondary | ICD-10-CM

## 2024-04-01 DIAGNOSIS — R609 Edema, unspecified: Secondary | ICD-10-CM | POA: Diagnosis not present

## 2024-04-01 DIAGNOSIS — I2510 Atherosclerotic heart disease of native coronary artery without angina pectoris: Secondary | ICD-10-CM | POA: Diagnosis not present

## 2024-04-01 DIAGNOSIS — Z791 Long term (current) use of non-steroidal anti-inflammatories (NSAID): Secondary | ICD-10-CM

## 2024-04-01 DIAGNOSIS — Z87442 Personal history of urinary calculi: Secondary | ICD-10-CM

## 2024-04-01 LAB — COMPREHENSIVE METABOLIC PANEL WITH GFR
ALT: 24 U/L (ref 0–44)
AST: 28 U/L (ref 15–41)
Albumin: 4.1 g/dL (ref 3.5–5.0)
Alkaline Phosphatase: 103 U/L (ref 38–126)
Anion gap: 14 (ref 5–15)
BUN: 46 mg/dL — ABNORMAL HIGH (ref 8–23)
CO2: 23 mmol/L (ref 22–32)
Calcium: 9.9 mg/dL (ref 8.9–10.3)
Chloride: 99 mmol/L (ref 98–111)
Creatinine, Ser: 1.56 mg/dL — ABNORMAL HIGH (ref 0.44–1.00)
GFR, Estimated: 34 mL/min — ABNORMAL LOW (ref >60)
Glucose, Bld: 97 mg/dL (ref 70–99)
Potassium: 5.4 mmol/L — ABNORMAL HIGH (ref 3.5–5.1)
Sodium: 136 mmol/L (ref 135–145)
Total Bilirubin: 0.4 mg/dL (ref 0.0–1.2)
Total Protein: 7.5 g/dL (ref 6.5–8.1)

## 2024-04-01 LAB — CBC
HCT: 27.1 % — ABNORMAL LOW (ref 36.0–46.0)
Hemoglobin: 9 g/dL — ABNORMAL LOW (ref 12.0–15.0)
MCH: 30 pg (ref 26.0–34.0)
MCHC: 33.2 g/dL (ref 30.0–36.0)
MCV: 90.3 fL (ref 80.0–100.0)
Platelets: 393 K/uL (ref 150–400)
RBC: 3 MIL/uL — ABNORMAL LOW (ref 3.87–5.11)
RDW: 13.6 % (ref 11.5–15.5)
WBC: 14.2 K/uL — ABNORMAL HIGH (ref 4.0–10.5)
nRBC: 0 % (ref 0.0–0.2)

## 2024-04-01 LAB — LIPASE, BLOOD: Lipase: 26 U/L (ref 11–51)

## 2024-04-01 LAB — TROPONIN T, HIGH SENSITIVITY
Troponin T High Sensitivity: 64 ng/L — ABNORMAL HIGH (ref 0–19)
Troponin T High Sensitivity: 57 ng/L — ABNORMAL HIGH (ref 0–19)

## 2024-04-01 LAB — TROPONIN I (HIGH SENSITIVITY): Troponin I (High Sensitivity): 36 ng/L — ABNORMAL HIGH (ref <18)

## 2024-04-01 LAB — PRO BRAIN NATRIURETIC PEPTIDE: Pro Brain Natriuretic Peptide: 4355 pg/mL — ABNORMAL HIGH (ref <300.0)

## 2024-04-01 MED ORDER — ONDANSETRON HCL 4 MG/2ML IJ SOLN
4.0000 mg | Freq: Once | INTRAMUSCULAR | Status: AC
  Administered 2024-04-01: 4 mg via INTRAVENOUS
  Filled 2024-04-01: qty 2

## 2024-04-01 MED ORDER — IOHEXOL 350 MG/ML SOLN
60.0000 mL | Freq: Once | INTRAVENOUS | Status: AC | PRN
  Administered 2024-04-01: 60 mL via INTRAVENOUS

## 2024-04-01 MED ORDER — MORPHINE SULFATE (PF) 4 MG/ML IV SOLN
4.0000 mg | Freq: Once | INTRAVENOUS | Status: AC
  Administered 2024-04-01: 4 mg via INTRAVENOUS
  Filled 2024-04-01: qty 1

## 2024-04-01 MED ORDER — ACETAMINOPHEN 650 MG RE SUPP: 650.0000 mg | Freq: Four times a day (QID) | RECTAL | Status: DC | PRN

## 2024-04-01 MED ORDER — ANASTROZOLE 1 MG PO TABS
1.0000 mg | ORAL_TABLET | Freq: Every day | ORAL | Status: DC
  Filled 2024-04-01 (×2): qty 1

## 2024-04-01 MED ORDER — ALUM & MAG HYDROXIDE-SIMETH 200-200-20 MG/5ML PO SUSP
30.0000 mL | Freq: Once | ORAL | Status: AC
Start: 2024-04-01 — End: 2024-04-01
  Administered 2024-04-01: 30 mL via ORAL
  Filled 2024-04-01: qty 30

## 2024-04-01 MED ORDER — OXYCODONE HCL 5 MG PO TABS
5.0000 mg | ORAL_TABLET | ORAL | Status: DC | PRN
  Administered 2024-04-01 – 2024-04-03 (×4): 5 mg via ORAL
  Filled 2024-04-01 (×4): qty 1

## 2024-04-01 MED ORDER — FUROSEMIDE 10 MG/ML IJ SOLN
40.0000 mg | Freq: Once | INTRAMUSCULAR | Status: AC
  Administered 2024-04-01: 40 mg via INTRAVENOUS
  Filled 2024-04-01: qty 4

## 2024-04-01 MED ORDER — PANTOPRAZOLE SODIUM 40 MG PO TBEC
40.0000 mg | DELAYED_RELEASE_TABLET | ORAL | Status: DC
  Administered 2024-04-02 – 2024-04-04 (×2): 40 mg via ORAL
  Filled 2024-04-01 (×2): qty 1

## 2024-04-01 MED ORDER — SODIUM CHLORIDE 0.9% FLUSH
3.0000 mL | Freq: Two times a day (BID) | INTRAVENOUS | Status: DC
  Administered 2024-04-01 – 2024-04-04 (×7): 3 mL via INTRAVENOUS

## 2024-04-01 MED ORDER — SODIUM CHLORIDE 0.9 % IV BOLUS
1000.0000 mL | Freq: Once | INTRAVENOUS | Status: AC
  Administered 2024-04-01: 1000 mL via INTRAVENOUS

## 2024-04-01 MED ORDER — FAMOTIDINE IN NACL 20-0.9 MG/50ML-% IV SOLN
20.0000 mg | Freq: Once | INTRAVENOUS | Status: AC
  Administered 2024-04-01: 20 mg via INTRAVENOUS
  Filled 2024-04-01: qty 50

## 2024-04-01 MED ORDER — ALUM & MAG HYDROXIDE-SIMETH 200-200-20 MG/5ML PO SUSP
30.0000 mL | Freq: Once | ORAL | Status: AC
  Administered 2024-04-01: 30 mL via ORAL
  Filled 2024-04-01: qty 30

## 2024-04-01 MED ORDER — FENTANYL CITRATE PF 50 MCG/ML IJ SOSY
50.0000 ug | PREFILLED_SYRINGE | Freq: Once | INTRAMUSCULAR | Status: AC
  Administered 2024-04-01: 50 ug via INTRAVENOUS
  Filled 2024-04-01: qty 1

## 2024-04-01 MED ORDER — LATANOPROST 0.005 % OP SOLN
1.0000 [drp] | Freq: Every day | OPHTHALMIC | Status: DC
  Administered 2024-04-01 – 2024-04-03 (×3): 1 [drp] via OPHTHALMIC
  Filled 2024-04-01: qty 2.5

## 2024-04-01 MED ORDER — ENOXAPARIN SODIUM 30 MG/0.3ML IJ SOSY
30.0000 mg | PREFILLED_SYRINGE | INTRAMUSCULAR | Status: DC
  Administered 2024-04-01 – 2024-04-02 (×2): 30 mg via SUBCUTANEOUS
  Filled 2024-04-01 (×3): qty 0.3

## 2024-04-01 MED ORDER — POLYETHYLENE GLYCOL 3350 17 G PO PACK: 17.0000 g | PACK | Freq: Every day | ORAL | Status: DC | PRN

## 2024-04-01 MED ORDER — SODIUM ZIRCONIUM CYCLOSILICATE 10 G PO PACK
10.0000 g | PACK | Freq: Once | ORAL | Status: AC
  Administered 2024-04-01: 10 g via ORAL
  Filled 2024-04-01: qty 1

## 2024-04-01 MED ORDER — TIZANIDINE HCL 4 MG PO TABS
2.0000 mg | ORAL_TABLET | Freq: Three times a day (TID) | ORAL | Status: DC | PRN
  Administered 2024-04-02 – 2024-04-04 (×7): 2 mg via ORAL
  Filled 2024-04-01 (×8): qty 1

## 2024-04-01 MED ORDER — FUROSEMIDE 10 MG/ML IJ SOLN
40.0000 mg | Freq: Two times a day (BID) | INTRAMUSCULAR | Status: DC
  Administered 2024-04-01 – 2024-04-03 (×4): 40 mg via INTRAVENOUS
  Filled 2024-04-01 (×4): qty 4

## 2024-04-01 MED ORDER — FAMOTIDINE 20 MG PO TABS
20.0000 mg | ORAL_TABLET | Freq: Once | ORAL | Status: AC
  Administered 2024-04-01: 20 mg via ORAL
  Filled 2024-04-01: qty 1

## 2024-04-01 MED ORDER — ACETAMINOPHEN 325 MG PO TABS
650.0000 mg | ORAL_TABLET | Freq: Four times a day (QID) | ORAL | Status: DC | PRN
  Administered 2024-04-02 – 2024-04-04 (×5): 650 mg via ORAL
  Filled 2024-04-01 (×5): qty 2

## 2024-04-01 MED ORDER — ACETAMINOPHEN 325 MG PO TABS
650.0000 mg | ORAL_TABLET | Freq: Once | ORAL | Status: AC
  Administered 2024-04-01: 650 mg via ORAL
  Filled 2024-04-01: qty 2

## 2024-04-01 NOTE — ED Triage Notes (Signed)
  Patient comes in with chest pain/reflux that started last night before dinner.  Patient states she took her prescribed medications, ate dinner, and went to bed.  Woke up around 0200 and felt worse.  Had R hip replacement surgery on 8/20.  Pain is nonradiating.  Denies any diaphoresis.  Endorses nausea and back pain.  Pain 4/10, soreness.

## 2024-04-01 NOTE — ED Notes (Signed)
 Called Cynthia Vasquez at [facility] for transport

## 2024-04-01 NOTE — H&P (Signed)
 History and Physical   Cynthia Vasquez FMW:[id number] DOB: [birth year] DOA: 04/01/2024  PCP: Delayne Artist PARAS, MD   Patient coming from: Home  Chief Complaint: Chest pain  HPI: Cynthia Vasquez is a 78 y.o. female with medical history significant of hypertension, GERD, breast cancer, anemia presenting with chest pain.  Patient noticed that she had lower chest pressure that started before she had dinner.  The pain persisted throughout the evening and ended up waking her from sleep.  She does report associated shortness of breath.  Of note, patient had hip surgery on her right hip earlier this month per patient.  States was performed Dr. Yvone at a local surgery center.  Has had some ongoing issues with pain from that.  No records available in the EMR.  Denies fevers, chills, abdominal pain, constipation, diarrhea, nausea, vomiting.  ED Course: Vital signs in the ED notable for blood pressure in the 100s-150s systolic.  Requiring 2 L and would desaturate to the 80s on room air.  Lab workup included CMP with potassium 5.4, BUN 46, creatinine 1.56 from baseline 1.1.  Hemoglobin 9.0 down from 12 2 months ago, leukocytosis of 14.2.  Troponin flat at 64, 57.  Lipase normal.  proBNP elevated to 4355.  CT PE study was negative for PE.  Did show increased subpleural scarring since 2021 suspected to be secondary to breast cancer treatment.  Otherwise no acute Sharol.  Patient received Tylenol , fentanyl , morphine , 1 L IV fluids, Lasix , Lokelma , Pepcid , Maalox, Zofran  in the ED.  Review of Systems: As per HPI otherwise all other systems reviewed and are negative.  Past Medical History:  Diagnosis Date   Atrial septal defect    repaired age 26   Cancer (HCC) 01/2020   IDC left breast   GERD (gastroesophageal reflux disease)    Glaucoma    Headache    migraines until age 34- 48, have them 3-4 x year   History of blood transfusion    with ASD surgery at age 5   History of kidney stones     passed stones   Hypertension    Personal history of chemotherapy    Personal history of radiation therapy    Vitamin D deficiency     Past Surgical History:  Procedure Laterality Date   ASD REPAIR     age 89   BREAST LUMPECTOMY     BREAST LUMPECTOMY WITH RADIOACTIVE SEED AND AXILLARY LYMPH NODE DISSECTION Left 01/12/2020   Procedure: LEFT BREAST LUMPECTOMY WITH RADIOACTIVE SEED AND LEFT AXILLARY LYMPH NODE DISSECTION;  Surgeon: Ethyl Lenis, MD;  Location: [facility];  Service: General;  Laterality: Left;  PEC BLOCK   COLONOSCOPY     IR REMOVAL TUN ACCESS W/ PORT W/O FL MOD SED  04/10/2021   PORTACATH PLACEMENT Right 02/02/2020   Procedure: INSERTION PORT-A-CATH WITH ULTRASOUND GUIDANCE;  Surgeon: Ethyl Lenis, MD;  Location: [facility];  Service: General;  Laterality: Right;   TOTAL HIP ARTHROPLASTY Right    UPPER GI ENDOSCOPY      Social History  reports that she has never smoked. She has never used smokeless tobacco. She reports that she does not drink alcohol and does not use drugs.  No Known Allergies  Family History  Problem Relation Age of Onset   Dementia Mother    Heart attack Father    Hypertension Father    Breast cancer Sister 32  Reviewed on admission  Prior to Admission medications   Medication Sig  Start Date End Date Taking? Authorizing Provider  celecoxib (CELEBREX) 200 MG capsule Take 200 mg by mouth 2 (two) times daily. 03/06/24  Yes [provider]  EQUATE STOOL SOFTENER 100 MG capsule Take 100 mg by mouth 2 (two) times daily. 03/06/24  Yes [provider]  oxyCODONE  (OXY IR/ROXICODONE ) 5 MG immediate release tablet Take 5 mg by mouth every 4 (four) hours as needed. 03/27/24  Yes [provider]  tiZANidine  (ZANAFLEX ) 2 MG tablet Take 2 mg by mouth every 8 (eight) hours as needed. 03/06/24  Yes [provider]  acetaminophen  (TYLENOL ) 500 MG tablet Take 1 tablet (500 mg total) by mouth every 8 (eight) hours as  needed. Take with aleve  220 md 02/15/20   Magrinat, Sandria BROCKS, MD  anastrozole  (ARIMIDEX ) 1 MG tablet Take 1 tablet (1 mg total) by mouth daily. 01/13/24   Iruku, Praveena, MD  b complex vitamins tablet Take 1 tablet by mouth 3 (three) times a week.    [provider]  calcium gluconate 500 MG tablet Take 1 tablet (500 mg total) by mouth 3 (three) times daily. 01/27/21   Magrinat, Sandria BROCKS, MD  cholecalciferol  (VITAMIN D) 1000 UNITS tablet Take 1,000 Units by mouth daily.    [provider]  hydrochlorothiazide  (HYDRODIURIL ) 25 MG tablet Take 1 tablet (25 mg total) by mouth daily. 11/14/21   Iruku, Praveena, MD  latanoprost  (XALATAN ) 0.005 % ophthalmic solution Place 1 drop into both eyes at bedtime. 07/15/19   [provider]  losartan  (COZAAR ) 50 MG tablet Take 1 tablet (50 mg total) by mouth daily. 07/02/20   Magrinat, Sandria BROCKS, MD  Multiple Vitamins-Minerals (MULTIVITAMIN WITH MINERALS) tablet Take 1 tablet by mouth daily.    [provider]  naproxen  sodium (ALEVE ) 220 MG tablet Take 1 tablet (220 mg total) by mouth every 8 (eight) hours as needed. Take with tylenol  500 mg. 02/15/20   Magrinat, Sandria BROCKS, MD  pantoprazole  (PROTONIX ) 40 MG tablet Take 40 mg by mouth every other day.    [provider]  Turmeric 500 MG TABS Take 1,500 mg by mouth daily.    [provider]    Physical Exam: Vitals:   04/01/24 1215 04/01/24 1230 04/01/24 1300 04/01/24 1447  BP:   (!) 140/54 (!) 151/60  Pulse: 73 70 69 73  Resp: 16 14 18 16   Temp:    98.2 F (36.8 C)  TempSrc:    Oral  SpO2: 100% 99% 99% 100%  Weight:      Height:        Physical Exam Constitutional:      General: She is not in acute distress.    Appearance: Normal appearance.  HENT:     Head: Normocephalic and atraumatic.     Mouth/Throat:     Mouth: Mucous membranes are moist.     Pharynx: Oropharynx is clear.  Eyes:     Extraocular Movements: Extraocular movements intact.      Pupils: Pupils are equal, round, and reactive to light.  Cardiovascular:     Rate and Rhythm: Normal rate and regular rhythm.     Pulses: Normal pulses.     Heart sounds: Normal heart sounds.  Pulmonary:     Effort: Pulmonary effort is normal. No respiratory distress.     Breath sounds: Normal breath sounds.  Abdominal:     General: Bowel sounds are normal. There is no distension.     Palpations: Abdomen is soft.  Tenderness: There is no abdominal tenderness.  Musculoskeletal:        General: No swelling or deformity.     Right lower leg: Edema (R>L) present.     Left lower leg: Edema present.  Skin:    General: Skin is warm and dry.  Neurological:     General: No focal deficit present.     Mental Status: Mental status is at baseline.    Labs on Admission: I have personally reviewed following labs and imaging studies  CBC: Recent Labs  Lab 04/01/24 0345  WBC 14.2*  HGB 9.0*  HCT 27.1*  MCV 90.3  PLT 393    Basic Metabolic Panel: Recent Labs  Lab 04/01/24 0345  NA 136  K 5.4*  CL 99  CO2 23  GLUCOSE 97  BUN 46*  CREATININE 1.56*  CALCIUM 9.9    GFR: Estimated Creatinine Clearance: 24.7 mL/min (A) (by C-G formula based on SCr of 1.56 mg/dL (H)).  Liver Function Tests: Recent Labs  Lab 04/01/24 0345  AST 28  ALT 24  ALKPHOS 103  BILITOT 0.4  PROT 7.5  ALBUMIN 4.1    Urine analysis:    Component Value Date/Time   COLORURINE YELLOW 06/11/2013 2007   APPEARANCEUR CLEAR 06/11/2013 2007   LABSPEC 1.017 06/11/2013 2007   PHURINE 6.5 06/11/2013 2007   GLUCOSEU NEGATIVE 06/11/2013 2007   HGBUR NEGATIVE 06/11/2013 2007   BILIRUBINUR NEGATIVE 06/11/2013 2007   KETONESUR NEGATIVE 06/11/2013 2007   PROTEINUR NEGATIVE 06/11/2013 2007   UROBILINOGEN 0.2 06/11/2013 2007   NITRITE NEGATIVE 06/11/2013 2007   LEUKOCYTESUR NEGATIVE 06/11/2013 2007    Radiological Exams on Admission: CT Angio Chest Pulmonary Embolism (PE) W or WO Contrast Result Date:  04/01/2024 CLINICAL DATA:  78 year old female with recent hip surgery, chest pain onset 0200 hours, high probability of pulmonary embolus. Acute renal insufficiency. History of breast cancer also. EXAM: CT ANGIOGRAPHY CHEST WITH CONTRAST TECHNIQUE: Multidetector CT imaging of the chest was performed using the standard protocol during bolus administration of intravenous contrast. Multiplanar CT image reconstructions and MIPs were obtained to evaluate the vascular anatomy. RADIATION DOSE REDUCTION: This exam was performed according to the departmental dose-optimization program which includes automated exposure control, adjustment of the mA and/or kV according to patient size and/or use of iterative reconstruction technique. CONTRAST:  60mL OMNIPAQUE  IOHEXOL  350 MG/ML SOLN COMPARISON:  Portable chest x-ray 02/02/2020. and CT chest 01/25/2020. FINDINGS: Cardiovascular: Good contrast bolus timing in the pulmonary arterial tree. No pulmonary artery filling defect identified. Tortuous thoracic aorta, especially the descending segment. Calcified aortic atherosclerosis. Cardiomegaly. No pericardial effusion. Calcified coronary artery atherosclerosis (series 6, image 132). Mediastinum/Nodes: Small gastric hiatal hernia. Negative for mediastinal mass or lymphadenopathy. Lungs/Pleura: Mildly lower lung volumes compared to 2021. New subpleural anterior lung scarring greater on the left. Major airways remain patent. No pleural effusion. Enhancing and platelike atelectasis in the left lower lobe. Stable 4 mm left lower lobe lung nodule since 2021 series 7, image 90, and stable similarright lower lobe nodules up to 3-4 mm on image 86 (no follow-up imaging recommended). no acute or suspicious lung opacity. Upper Abdomen: Negative visible mostly noncontrast liver, spleen, pancreas, adrenal glands and upper abdominal bowel. Exophytic simple fluid density left renal cyst is chronic (no follow-up imaging recommended). Musculoskeletal:  Chronically advanced thoracic spine degeneration, exaggerated thoracic kyphosis. Increased vacuum disc since 2021. No acute or suspicious osseous lesion identified. Postoperative changes to the left breast. Left axillary node dissection changes. Review of the MIP  images confirms the above findings. IMPRESSION: 1. Negative for acute pulmonary embolus. 2. Increased subpleural lung scarring since 2021 is probably sequelae of interval breast cancer treatment. Atelectasis. No acute finding in the Chest. 3. Calcified coronary artery,  Aortic Atherosclerosis (ICD10-I70.0). Electronically Signed   By: VEAR Hurst M.D.   On: 04/01/2024 05:38   EKG: Independently reviewed.  Sinus rhythm at 81 bpm.  Nonspecific T wave changes.  Low voltage multiple leads.  Assessment/Plan Principal Problem:   CHF (congestive heart failure) (HCC) Active Problems:   Hypertension   GERD (gastroesophageal reflux disease)   Malignant neoplasm of upper-outer quadrant of left breast in female, estrogen receptor positive (HCC)   Acute respite failure with hypoxia Suspected new CHF > Patient presenting with chest pressure that started yesterday evening and persisted eventually waking her from sleep. > In the ED patient noted to be requiring 2 L to maintain saturations and desaturating to 80s on room air.   > Also noted to have proBNP of 4355.  Troponin flat at 64, 57.  CTA without evidence of PE but did show some increased subpleural scarring.  CTA with no evidence of pericardial effusion. > Received Lasix  in the ED, this was after further labs resulted and after patient had already received 1 L of IV fluids. - Monitor on telemetry - Continue supportive oxygen , wean as tolerated - Continue with Lasix  40 mg IV twice daily - Echocardiogram - Check magnesium  - Trend renal function and electrolytes - Strict I's and O's, daily weights  AKI > Creatinine elevated to 1.56 and baseline 1.1.  Suspect to be due to volume overload in the  setting of above. - Continue with Lasix  - Trend renal function and electrolytes  Anemia Leukocytosis > Noted to have hemoglobin 9.0 down from 12 two months ago.  Possibly delusional component, no reports of bleeding.  In addition, with recent surgery she likely had some blood loss associated with that. > Leukocytosis to 14.2, no fevers, no clear infectious etiology.  No urinary symptoms and CTA PE study was negative for any infectious etiology of the chest.  Presumed reactive at this time. - Trend CBC  Recent right hip surgery > Ongoing pain associated with this.  States that standing can up we will try to get her back and forth through the chair.  Is on oxygen , restricting movement some. - Continue oxycodone  and tizanidine  - Up to chair every shift  Hypertension - Lasix  as above - Hold losartan  and hydrochlorothiazide  in the setting of AKI  GERD - Continue PPI  Breast cancer - Continue home anastrozole   DVT prophylaxis: Lovenox  Code Status:   Full Family Communication:  None on admission  Disposition Plan:   Patient is from:  Home  Anticipated DC to:  Home  Anticipated DC date:  2 to 4 days  Anticipated DC barriers: None  Consults called:  None Admission status:  Inpatient, telemetry  Severity of Illness: The appropriate patient status for this patient is INPATIENT. Inpatient status is judged to be reasonable and necessary in order to provide the required intensity of service to ensure the patient's safety. The patient's presenting symptoms, physical exam findings, and initial radiographic and laboratory data in the context of their chronic comorbidities is felt to place them at high risk for further clinical deterioration. Furthermore, it is not anticipated that the patient will be medically stable for discharge from the hospital within 2 midnights of admission.   * I certify that at the point of admission  it is my clinical judgment that the patient will require inpatient  hospital care spanning beyond 2 midnights from the point of admission due to high intensity of service, high risk for further deterioration and high frequency of surveillance required.DEWAINE Marsa KATHEE Seena MD [facility]  How to contact the [facility] Attending or Consulting provider 7A - 7P or covering provider during after hours 7P -7A, for this patient?   Check the care team in [facility] and look for a) attending/consulting [facility] provider listed and b) the [facility] team listed Log into [email or web address] and use [place]'s universal password to access. If you do not have the password, please contact the hospital operator. Locate the [facility] provider you are looking for under [facility] and page to a number that you can be directly reached. If you still have difficulty reaching the provider, please page the [facility] (Director on Call) for the Hospitalists listed on amion for assistance.  04/01/2024, 2:58 PM

## 2024-04-01 NOTE — ED Notes (Signed)
 Spouse # [id number]. Joe.

## 2024-04-01 NOTE — ED Notes (Signed)
 Note: she has ambulated with walker assist x 2 today. Her husband remains with her.

## 2024-04-01 NOTE — ED Notes (Signed)
 Patient transported to CT

## 2024-04-01 NOTE — ED Provider Notes (Signed)
 [facility] Emergency Department Provider Note MRN:  [id number]  Arrival date & time: 04/01/24     Chief Complaint   Chest Pain and Gastroesophageal Reflux   History of Present Illness   Cynthia Vasquez is a 78 y.o. year-old female with a history of breast cancer, GERD, hypertension presenting to the ED with chief complaint of chest pain.  Lower chest pressure starting before dinner, not really going away, getting worse waking her from sleep.  Associated with shortness of breath.  Recovering from hip replacement surgery.  Review of Systems  A thorough review of systems was obtained and all systems are negative except as noted in the HPI and PMH.   Patient's Health History    Past Medical History:  Diagnosis Date   Atrial septal defect    repaired age 33   Cancer (HCC) 01/2020   IDC left breast   GERD (gastroesophageal reflux disease)    Glaucoma    Headache    migraines until age 76- 65, have them 3-4 x year   History of blood transfusion    with ASD surgery at age 28   History of kidney stones    passed stones   Hypertension    Personal history of chemotherapy    Personal history of radiation therapy    Vitamin D deficiency     Past Surgical History:  Procedure Laterality Date   ASD REPAIR     age 58   BREAST LUMPECTOMY     BREAST LUMPECTOMY WITH RADIOACTIVE SEED AND AXILLARY LYMPH NODE DISSECTION Left 01/12/2020   Procedure: LEFT BREAST LUMPECTOMY WITH RADIOACTIVE SEED AND LEFT AXILLARY LYMPH NODE DISSECTION;  Surgeon: Ethyl Lenis, MD;  Location: [facility] OR;  Service: General;  Laterality: Left;  PEC BLOCK   COLONOSCOPY     IR REMOVAL TUN ACCESS W/ PORT W/O FL MOD SED  04/10/2021   PORTACATH PLACEMENT Right 02/02/2020   Procedure: INSERTION PORT-A-CATH WITH ULTRASOUND GUIDANCE;  Surgeon: Ethyl Lenis, MD;  Location: [facility];  Service: General;  Laterality: Right;   UPPER GI ENDOSCOPY      Family History  Problem Relation Age of  Onset   Dementia Mother    Heart attack Father    Hypertension Father    Breast cancer Sister 69    Social History   Socioeconomic History   Marital status: Married    Spouse name: Fairy   Number of children: 3   Years of education: 16   Highest education level: Not on file  Occupational History   Occupation: Runner, broadcasting/film/video    Comment: retired  Tobacco Use   Smoking status: Never   Smokeless tobacco: Never  Vaping Use   Vaping status: Never Used  Substance and Sexual Activity   Alcohol use: No   Drug use: No   Sexual activity: Not on file  Other Topics Concern   Not on file  Social History Narrative   Lives at home with husband   Caffeine use- green tea 1-2 glasses a day   Social Drivers of Corporate investment banker Strain: Not on file  Food Insecurity: Not on file  Transportation Needs: Not on file  Physical Activity: Not on file  Stress: Not on file  Social Connections: Not on file  Intimate Partner Violence: Not on file     Physical Exam   Vitals:   04/01/24 0600 04/01/24 0615  BP: (!) 139/57 (!) 139/55  Pulse: 82 84  Resp: 11  19  Temp:    SpO2: 100% 100%    CONSTITUTIONAL: Well-appearing, moderate distress due to pain NEURO/PSYCH:  Alert and oriented x 3, no focal deficits EYES:  eyes equal and reactive ENT/NECK:  no LAD, no JVD CARDIO: Regular rate, well-perfused, normal S1 and S2 PULM:  CTAB no wheezing or rhonchi GI/GU:  non-distended, non-tender MSK/SPINE:  No gross deformities, no edema SKIN:  no rash, atraumatic   *Additional and/or pertinent findings included in MDM below  Diagnostic and Interventional Summary    EKG Interpretation Date/Time:  Saturday April 01 2024 03:32:20 EDT Ventricular Rate:  81 PR Interval:  161 QRS Duration:  95 QT Interval:  395 QTC Calculation: 459 R Axis:   94  Text Interpretation: Sinus rhythm Right axis deviation Confirmed by Theadore Sharper [phone]) on 04/01/2024 4:13:01 AM       Labs Reviewed  CBC -  Abnormal; Notable for the following components:      Result Value   WBC 14.2 (*)    RBC 3.00 (*)    Hemoglobin 9.0 (*)    HCT 27.1 (*)    All other components within normal limits  COMPREHENSIVE METABOLIC PANEL WITH GFR - Abnormal; Notable for the following components:   Potassium 5.4 (*)    BUN 46 (*)    Creatinine, Ser 1.56 (*)    GFR, Estimated 34 (*)    All other components within normal limits  PRO BRAIN NATRIURETIC PEPTIDE - Abnormal; Notable for the following components:   Pro Brain Natriuretic Peptide 4,355.0 (*)    All other components within normal limits  TROPONIN T, HIGH SENSITIVITY - Abnormal; Notable for the following components:   Troponin T High Sensitivity 64 (*)    All other components within normal limits  TROPONIN T, HIGH SENSITIVITY - Abnormal; Notable for the following components:   Troponin T High Sensitivity 57 (*)    All other components within normal limits  LIPASE, BLOOD    CT Angio Chest Pulmonary Embolism (PE) W or WO Contrast  Final Result      Medications  morphine  (PF) 4 MG/ML injection 4 mg (has no administration in time range)  alum & mag hydroxide-simeth (MAALOX/MYLANTA) 200-200-20 MG/5ML suspension 30 mL (30 mLs Oral Given 04/01/24 0347)  famotidine  (PEPCID ) IVPB 20 mg premix (0 mg Intravenous Stopped 04/01/24 0416)  ondansetron  (ZOFRAN ) injection 4 mg (4 mg Intravenous Given 04/01/24 0347)  fentaNYL  (SUBLIMAZE ) injection 50 mcg (50 mcg Intravenous Given 04/01/24 0351)  sodium chloride  0.9 % bolus 1,000 mL (1,000 mLs Intravenous New Bag/Given 04/01/24 0518)  iohexol  (OMNIPAQUE ) 350 MG/ML injection 60 mL (60 mLs Intravenous Contrast Given 04/01/24 0509)     Procedures  /  Critical Care Procedures  ED Course and Medical Decision Making  Initial Impression and Ddx ACS is considered as is PE given patient's recent orthopedic surgery.  Has been taking the daily full dose aspirin  as prescribed.  Could simply be GERD as well.  Symptomatic management,  labs, CT PE study.  No STEMI on EKG.  Past medical/surgical history that increases complexity of ED encounter: ASD repair  Interpretation of Diagnostics I personally reviewed the EKG and Laboratory Testing and my interpretation is as follows: Sinus tachycardia  Labs reveal leukocytosis, acute kidney injury with mild hyperkalemia, prominently elevated BNP, mildly elevated troponin that is downtrending upon repeat.  Patient Reassessment and Ultimate Disposition/Management     Patient requiring 2 L nasal cannula in response to desaturation down into the 80s.  Continues to  have dyspnea and tachypnea of unclear cause.  Suspect she would benefit from admission for repeat echo, further testing to evaluate.  PE scan largely unremarkable.  Patient management required discussion with the following services or consulting groups:  Hospitalist Service  Complexity of Problems Addressed Acute illness or injury that poses threat of life of bodily function  Additional Data Reviewed and Analyzed Further history obtained from: Further history from spouse/family member  Additional Factors Impacting ED Encounter Risk Consideration of hospitalization  Ozell HERO. Theadore, MD [facility] Emergency Medicine [facility] [email or web address]  Final Clinical Impressions(s) / ED Diagnoses     ICD-10-CM   1. AKI (acute kidney injury) (HCC)  N17.9     2. Hypoxia  R09.02     3. Chest pain, unspecified type  R07.9     4. Anemia, unspecified type  D64.9       ED Discharge Orders     None        Discharge Instructions Discussed with and Provided to Patient:   Discharge Instructions   None      Theadore Ozell HERO, MD 04/01/24 [phone]

## 2024-04-01 NOTE — ED Provider Notes (Signed)
  Provider Note MRN:  [id number]  Arrival date & time: 04/01/24    ED Course and Medical Decision Making  Assumed care from Dr Theadore at shift change.  See note from prior team for complete details, in brief:  Clinical Course as of 04/01/24 0809  Sat Apr 01, 2024  0705 Handoff MB 78 yo/f here w/ cp/dib since last pm Hx ASD, breast cancer (chemo 2021, now just anastrazole, Dr Loretha) no home o2 New hypoxia, 2LNC BNP 4k, Trop 57 Cr 1.56 WBC 14 no fever CTPE stable Given lasix  40mg  Prior echo 7/21, LVEF 70% Needs admission, echo [SG]    Clinical Course User Index [SG] Elnor Jayson LABOR, DO     Spoke Dr Roxane accepts pt for admit     .Critical Care  Performed by: Elnor Jayson LABOR, DO Authorized by: Elnor Jayson LABOR, DO   Critical care provider statement:    Critical care time (minutes):  30   Critical care time was exclusive of:  Separately billable procedures and treating other patients   Critical care was necessary to treat or prevent imminent or life-threatening deterioration of the following conditions:  Respiratory failure   Critical care was time spent personally by me on the following activities:  Development of treatment plan with patient or surrogate, discussions with consultants, evaluation of patient's response to treatment, examination of patient, ordering and review of laboratory studies, ordering and review of radiographic studies, ordering and performing treatments and interventions, pulse oximetry, re-evaluation of patient's condition, review of old charts and obtaining history from patient or surrogate   Care discussed with: admitting provider     Final Clinical Impressions(s) / ED Diagnoses     ICD-10-CM   1. AKI (acute kidney injury) (HCC)  N17.9     2. Hypoxia  R09.02     3. Chest pain, unspecified type  R07.9     4. Anemia, unspecified type  D64.9       ED Discharge Orders     None       Discharge Instructions   None        Elnor Jayson LABOR,  DO 04/01/24 [phone]

## 2024-04-01 NOTE — ED Notes (Signed)
 I have just given report to Andre, Charity fundraiser at [facility]. She just left with [facility].

## 2024-04-01 NOTE — Plan of Care (Signed)
   Problem: Education: Goal: Knowledge of General Education information will improve Description Including pain rating scale, medication(s)/side effects and non-pharmacologic comfort measures Outcome: Progressing

## 2024-04-02 DIAGNOSIS — I509 Heart failure, unspecified: Secondary | ICD-10-CM | POA: Diagnosis not present

## 2024-04-02 LAB — CBC
HCT: 26.1 % — ABNORMAL LOW (ref 36.0–46.0)
Hemoglobin: 8.5 g/dL — ABNORMAL LOW (ref 12.0–15.0)
MCH: 29.7 pg (ref 26.0–34.0)
MCHC: 32.6 g/dL (ref 30.0–36.0)
MCV: 91.3 fL (ref 80.0–100.0)
Platelets: 407 K/uL — ABNORMAL HIGH (ref 150–400)
RBC: 2.86 MIL/uL — ABNORMAL LOW (ref 3.87–5.11)
RDW: 13.8 % (ref 11.5–15.5)
WBC: 8.5 K/uL (ref 4.0–10.5)
nRBC: 0 % (ref 0.0–0.2)

## 2024-04-02 LAB — COMPREHENSIVE METABOLIC PANEL WITH GFR
ALT: 21 U/L (ref 0–44)
AST: 25 U/L (ref 15–41)
Albumin: 3.2 g/dL — ABNORMAL LOW (ref 3.5–5.0)
Alkaline Phosphatase: 68 U/L (ref 38–126)
Anion gap: 11 (ref 5–15)
BUN: 34 mg/dL — ABNORMAL HIGH (ref 8–23)
CO2: 27 mmol/L (ref 22–32)
Calcium: 8.9 mg/dL (ref 8.9–10.3)
Chloride: 99 mmol/L (ref 98–111)
Creatinine, Ser: 1.65 mg/dL — ABNORMAL HIGH (ref 0.44–1.00)
GFR, Estimated: 32 mL/min — ABNORMAL LOW (ref >60)
Glucose, Bld: 96 mg/dL (ref 70–99)
Potassium: 4.3 mmol/L (ref 3.5–5.1)
Sodium: 137 mmol/L (ref 135–145)
Total Bilirubin: 0.7 mg/dL (ref 0.0–1.2)
Total Protein: 6.4 g/dL — ABNORMAL LOW (ref 6.5–8.1)

## 2024-04-02 LAB — MAGNESIUM: Magnesium: 2.6 mg/dL — ABNORMAL HIGH (ref 1.7–2.4)

## 2024-04-02 LAB — GLUCOSE, CAPILLARY: Glucose-Capillary: 120 mg/dL — ABNORMAL HIGH (ref 70–99)

## 2024-04-02 MED ORDER — FENTANYL CITRATE PF 50 MCG/ML IJ SOSY: 12.5000 ug | PREFILLED_SYRINGE | Freq: Once | INTRAMUSCULAR | Status: DC | PRN

## 2024-04-02 MED ORDER — NALOXONE HCL 0.4 MG/ML IJ SOLN
0.4000 mg | INTRAMUSCULAR | Status: DC | PRN
  Filled 2024-04-02: qty 1

## 2024-04-02 MED ORDER — NALOXONE HCL 0.4 MG/ML IJ SOLN
INTRAMUSCULAR | Status: AC
  Administered 2024-04-02: 0.4 mg via INTRAVENOUS
  Filled 2024-04-02: qty 1

## 2024-04-02 MED ORDER — SODIUM CHLORIDE 0.9 % IV BOLUS
500.0000 mL | Freq: Once | INTRAVENOUS | Status: AC
  Administered 2024-04-02: 500 mL via INTRAVENOUS

## 2024-04-02 NOTE — Progress Notes (Signed)
 Mobility Specialist Progress Note;   04/02/24 0931  Mobility  Activity Ambulated with assistance  Level of Assistance Contact guard assist, steadying assist  Assistive Device Front wheel walker  Distance Ambulated (ft) 50 ft  Activity Response Tolerated well  Mobility Referral Yes  Mobility visit 1 Mobility  Mobility Specialist Start Time (ACUTE ONLY) 0931  Mobility Specialist Stop Time (ACUTE ONLY) 0950  Mobility Specialist Time Calculation (min) (ACUTE ONLY) 19 min   Pt in chair upon arrival and agreeable to mobility. RN in room taking orthostatic BPS. On 2LO2 upon arrival. Required light MinG assistance during ambulation for safety. C/o intermittent dizziness. Ambulated on 1LO2, VSS throughout. Pt returned to chair and left with all needs met, call bell in reach. RN notified.   Lauraine Erm Mobility Specialist Please contact via SecureChat or [employer] [phone]

## 2024-04-02 NOTE — Plan of Care (Signed)
  Problem: Activity: Goal: Risk for activity intolerance will decrease Outcome: Progressing  Able to ambulate using walker but c/o dizziness intermittently.

## 2024-04-02 NOTE — Progress Notes (Signed)
 Asleep in recliner, unable to arouse patient. BP 56/35, HR 71. RRT called. Recent meds include Oxycodone  and Zanaflex . Returned to bed x 4 assist, Dr. Fairy notified. Orders for Narcan  and fluids obtained. RRT nurse arriving.

## 2024-04-02 NOTE — Progress Notes (Signed)
 PROGRESS NOTE    Cynthia Vasquez  FMW:[id number] DOB: 06-03-1946 DOA: 04/01/2024 PCP: Delayne Artist PARAS, MD   77/F w hypertension, GERD, breast cancer, anemia presenting with chest pressure, swelling.  She reported chronic dyspnea on exertion, in addition has also reported dizziness recently. - Underwent right hip surgery on 8/20, since then has noticed progressive swelling, worse in the right leg -In the ED hypoxic requiring 2 L O2, creatinine 1.5, hemoglobin 9, WBC 14, troponin 64, 57, proBNP 4355, CTA chest negative for PE, increased subpleural scarring  Subjective: -Noted some dizziness when she got up to the bathroom this morning - No further chest pain or pressure  Assessment and Plan:  Acute respite failure with hypoxia Acute CHF, possibly diastolic -Last echo 2021 with preserved EF, normal RV -Appears volume overloaded, continue IV Lasix  today -Follow-up 2D echo -Add GDMT if kidney function stabilizes -Right lower leg disproportionately swollen, also check Dopplers   AKI - Creatinine higher than baseline of 1, likely cardiorenal, monitor with diuresis -Holding losartan    Anemia Leukocytosis -Hemoglobin has trended down since recent surgery, likely related to postop blood loss and some component of hemodilution from volume overload -Check anemia panel with a.m. labs   Recent right hip surgery - Continue oxycodone  and tizanidine  - PT eval   GERD - Continue PPI   Breast cancer - Continue home anastrozole    DVT prophylaxis:      Lovenox  Code Status:              Full Family Communication:       None on admission Disposition Plan: Home pending above workup  Consultants:    Procedures:   Antimicrobials:    Objective: Vitals:   04/02/24 0935 04/02/24 0937 04/02/24 0939 04/02/24 0945  BP: (!) 144/61 (!) 146/128 132/66 (!) 121/108  Pulse: 79 79  88  Resp:      Temp:      TempSrc:      SpO2: 96% 96%  95%  Weight:      Height:        Intake/Output Summary  (Last 24 hours) at 04/02/2024 1004 Last data filed at 04/02/2024 0847 Gross per 24 hour  Intake 837 ml  Output 3150 ml  Net -2313 ml   Filed Weights   04/01/24 0334 04/02/24 0351  Weight: 64.9 kg 66.2 kg    Examination:  General exam: Appears calm and comfortable  HEENT: Positive JVD Respiratory system: Few basilar rales Cardiovascular system: S1 & S2 heard, RRR.  Abd: nondistended, soft and nontender.Normal bowel sounds heard. Central nervous system: Alert and oriented. No focal neurological deficits. Extremities: 2+ edema on the right, 1+ on the left Skin: No rashes Psychiatry:  Mood & affect appropriate.     Data Reviewed:   CBC: Recent Labs  Lab 04/01/24 0345 04/02/24 0230  WBC 14.2* 8.5  HGB 9.0* 8.5*  HCT 27.1* 26.1*  MCV 90.3 91.3  PLT 393 407*   Basic Metabolic Panel: Recent Labs  Lab 04/01/24 0345 04/02/24 0230  NA 136 137  K 5.4* 4.3  CL 99 99  CO2 23 27  GLUCOSE 97 96  BUN 46* 34*  CREATININE 1.56* 1.65*  CALCIUM 9.9 8.9  MG  --  2.6*   GFR: Estimated Creatinine Clearance: 23.6 mL/min (A) (by C-G formula based on SCr of 1.65 mg/dL (H)). Liver Function Tests: Recent Labs  Lab 04/01/24 0345 04/02/24 0230  AST 28 25  ALT 24 21  ALKPHOS 103 68  BILITOT  0.4 0.7  PROT 7.5 6.4*  ALBUMIN 4.1 3.2*   Recent Labs  Lab 04/01/24 0345  LIPASE 26   No results for input(s): AMMONIA in the last 168 hours. Coagulation Profile: No results for input(s): INR, PROTIME in the last 168 hours. Cardiac Enzymes: No results for input(s): CKTOTAL, CKMB, CKMBINDEX, TROPONINI in the last 168 hours. BNP (last 3 results) Recent Labs    04/01/24 0345  PROBNP 4,355.0*   HbA1C: No results for input(s): HGBA1C in the last 72 hours. CBG: No results for input(s): GLUCAP in the last 168 hours. Lipid Profile: No results for input(s): CHOL, HDL, LDLCALC, TRIG, CHOLHDL, LDLDIRECT in the last 72 hours. Thyroid  Function Tests: No  results for input(s): TSH, T4TOTAL, FREET4, T3FREE, THYROIDAB in the last 72 hours. Anemia Panel: No results for input(s): VITAMINB12, FOLATE, FERRITIN, TIBC, IRON , RETICCTPCT in the last 72 hours. Urine analysis:    Component Value Date/Time   COLORURINE YELLOW 06/11/2013 2007   APPEARANCEUR CLEAR 06/11/2013 2007   LABSPEC 1.017 06/11/2013 2007   PHURINE 6.5 06/11/2013 2007   GLUCOSEU NEGATIVE 06/11/2013 2007   HGBUR NEGATIVE 06/11/2013 2007   BILIRUBINUR NEGATIVE 06/11/2013 2007   KETONESUR NEGATIVE 06/11/2013 2007   PROTEINUR NEGATIVE 06/11/2013 2007   UROBILINOGEN 0.2 06/11/2013 2007   NITRITE NEGATIVE 06/11/2013 2007   LEUKOCYTESUR NEGATIVE 06/11/2013 2007   Sepsis Labs: @LABRCNTIP (procalcitonin:4,lacticidven:4)  )No results found for this or any previous visit (from the past 240 hours).   Radiology Studies: CT Angio Chest Pulmonary Embolism (PE) W or WO Contrast Result Date: 04/01/2024 CLINICAL DATA:  78 year old female with recent hip surgery, chest pain onset 0200 hours, high probability of pulmonary embolus. Acute renal insufficiency. History of breast cancer also. EXAM: CT ANGIOGRAPHY CHEST WITH CONTRAST TECHNIQUE: Multidetector CT imaging of the chest was performed using the standard protocol during bolus administration of intravenous contrast. Multiplanar CT image reconstructions and MIPs were obtained to evaluate the vascular anatomy. RADIATION DOSE REDUCTION: This exam was performed according to the departmental dose-optimization program which includes automated exposure control, adjustment of the mA and/or kV according to patient size and/or use of iterative reconstruction technique. CONTRAST:  60mL OMNIPAQUE  IOHEXOL  350 MG/ML SOLN COMPARISON:  Portable chest x-ray 02/02/2020. and CT chest 01/25/2020. FINDINGS: Cardiovascular: Good contrast bolus timing in the pulmonary arterial tree. No pulmonary artery filling defect identified. Tortuous thoracic  aorta, especially the descending segment. Calcified aortic atherosclerosis. Cardiomegaly. No pericardial effusion. Calcified coronary artery atherosclerosis (series 6, image 132). Mediastinum/Nodes: Small gastric hiatal hernia. Negative for mediastinal mass or lymphadenopathy. Lungs/Pleura: Mildly lower lung volumes compared to 2021. New subpleural anterior lung scarring greater on the left. Major airways remain patent. No pleural effusion. Enhancing and platelike atelectasis in the left lower lobe. Stable 4 mm left lower lobe lung nodule since 2021 series 7, image 90, and stable similarright lower lobe nodules up to 3-4 mm on image 86 (no follow-up imaging recommended). no acute or suspicious lung opacity. Upper Abdomen: Negative visible mostly noncontrast liver, spleen, pancreas, adrenal glands and upper abdominal bowel. Exophytic simple fluid density left renal cyst is chronic (no follow-up imaging recommended). Musculoskeletal: Chronically advanced thoracic spine degeneration, exaggerated thoracic kyphosis. Increased vacuum disc since 2021. No acute or suspicious osseous lesion identified. Postoperative changes to the left breast. Left axillary node dissection changes. Review of the MIP images confirms the above findings. IMPRESSION: 1. Negative for acute pulmonary embolus. 2. Increased subpleural lung scarring since 2021 is probably sequelae of interval breast cancer treatment. Atelectasis. No acute  finding in the Chest. 3. Calcified coronary artery,  Aortic Atherosclerosis (ICD10-I70.0). Electronically Signed   By: VEAR Hurst M.D.   On: 04/01/2024 05:38     Scheduled Meds:  anastrozole   1 mg Oral Daily   enoxaparin  (LOVENOX ) injection  30 mg Subcutaneous Q24H   furosemide   40 mg Intravenous BID   latanoprost   1 drop Both Eyes QHS   pantoprazole   40 mg Oral QODAY   sodium chloride  flush  3 mL Intravenous Q12H   Continuous Infusions:   LOS: 1 day    Time spent:    Sigurd Pac,  MD [facility]   04/02/2024, 10:04 AM

## 2024-04-02 NOTE — Progress Notes (Signed)
 Patient asking if it is ok to take her own Norvasc from home, which is at her bedside. Uncertain if she took it this morning. Sent meds home with husband and explained not to take any meds from home, all meds will be ordered by DR and verified by pharmacy here in hospital.

## 2024-04-02 NOTE — Significant Event (Signed)
 Rapid Response Event Note   Reason for Call :  Unresponsive after receiving pain med and muscle relaxant.    Initial Focused Assessment:  Patient unresponsive to staff:  Staff assisted patient to bed  BP 56/34  HR 70  Interventions:  0.4mg  Narcan  given IV 500 cc NS bolus  Reassessment:  Patient waking up and returning to her neurological baseline. NIHSS 0 (Right leg minimal lift d/t recent hip surgery) BP 84/45 Post fluid Bolus:  113/49  Plan of Care:  RN to call if patient becomes somnolent again or if she becomes hypotensive.   Event Summary:   MD Notified: Dr Fairy notified by RN Call Time: 1331 Arrival Time: 1334 End Time: 1415  Elvin [place], RN

## 2024-04-02 NOTE — Progress Notes (Signed)
   04/02/24 1347  Spiritual Encounters  Type of Visit Initial  Care provided to: Pt not available  Conversation partners present during encounter Nurse  Referral source Nurse (RN/NT/LPN)  OnCall Visit Yes   Chaplain responded to a rapid response team page. Patient bounced back quickly. No family present at bedside. Patient still undergoing evaluation from medical team. Per RN, no needs at this time. Spiritual care services available as needed.   Juliene CHRISTELLA Das, Chaplain 04/02/24

## 2024-04-03 ENCOUNTER — Inpatient Hospital Stay (HOSPITAL_COMMUNITY)

## 2024-04-03 DIAGNOSIS — R609 Edema, unspecified: Secondary | ICD-10-CM

## 2024-04-03 DIAGNOSIS — I5021 Acute systolic (congestive) heart failure: Secondary | ICD-10-CM

## 2024-04-03 DIAGNOSIS — I509 Heart failure, unspecified: Secondary | ICD-10-CM | POA: Diagnosis not present

## 2024-04-03 LAB — IRON AND TIBC
Iron: 32 ug/dL (ref 28–170)
Saturation Ratios: 12 % (ref 10.4–31.8)
TIBC: 267 ug/dL (ref 250–450)
UIBC: 235 ug/dL

## 2024-04-03 LAB — ECHOCARDIOGRAM COMPLETE
AR max vel: 1.95 cm2
AV Peak grad: 8.8 mmHg
Ao pk vel: 1.48 m/s
Area-P 1/2: 3.08 cm2
Height: 59 in
S' Lateral: 2.6 cm
Weight: 2317.48 [oz_av]

## 2024-04-03 LAB — CBC
HCT: 25.8 % — ABNORMAL LOW (ref 36.0–46.0)
Hemoglobin: 8.5 g/dL — ABNORMAL LOW (ref 12.0–15.0)
MCH: 30 pg (ref 26.0–34.0)
MCHC: 32.9 g/dL (ref 30.0–36.0)
MCV: 91.2 fL (ref 80.0–100.0)
Platelets: 413 K/uL — ABNORMAL HIGH (ref 150–400)
RBC: 2.83 MIL/uL — ABNORMAL LOW (ref 3.87–5.11)
RDW: 13.4 % (ref 11.5–15.5)
WBC: 8.6 K/uL (ref 4.0–10.5)
nRBC: 0 % (ref 0.0–0.2)

## 2024-04-03 LAB — RETICULOCYTES
Immature Retic Fract: 19.4 % — ABNORMAL HIGH (ref 2.3–15.9)
RBC.: 2.82 MIL/uL — ABNORMAL LOW (ref 3.87–5.11)
Retic Count, Absolute: 36.9 K/uL (ref 19.0–186.0)
Retic Ct Pct: 1.3 % (ref 0.4–3.1)

## 2024-04-03 LAB — BASIC METABOLIC PANEL WITH GFR
Anion gap: 10 (ref 5–15)
BUN: 31 mg/dL — ABNORMAL HIGH (ref 8–23)
CO2: 30 mmol/L (ref 22–32)
Calcium: 8.9 mg/dL (ref 8.9–10.3)
Chloride: 98 mmol/L (ref 98–111)
Creatinine, Ser: 1.73 mg/dL — ABNORMAL HIGH (ref 0.44–1.00)
GFR, Estimated: 30 mL/min — ABNORMAL LOW (ref >60)
Glucose, Bld: 82 mg/dL (ref 70–99)
Potassium: 4.2 mmol/L (ref 3.5–5.1)
Sodium: 138 mmol/L (ref 135–145)

## 2024-04-03 LAB — FOLATE: Folate: 18.9 ng/mL (ref >5.9)

## 2024-04-03 LAB — VITAMIN B12: Vitamin B-12: 1342 pg/mL — ABNORMAL HIGH (ref 180–914)

## 2024-04-03 LAB — FERRITIN: Ferritin: 281 ng/mL (ref 11–307)

## 2024-04-03 MED ORDER — IRON SUCROSE 300 MG IVPB - SIMPLE MED
300.0000 mg | Freq: Once | Status: AC
  Administered 2024-04-03: 300 mg via INTRAVENOUS
  Filled 2024-04-03: qty 300

## 2024-04-03 MED ORDER — PERFLUTREN LIPID MICROSPHERE
1.0000 mL | INTRAVENOUS | Status: AC | PRN
  Administered 2024-04-03: 4 mL via INTRAVENOUS

## 2024-04-03 MED ORDER — FUROSEMIDE 40 MG PO TABS
40.0000 mg | ORAL_TABLET | Freq: Every day | ORAL | Status: DC
  Filled 2024-04-03: qty 1

## 2024-04-03 NOTE — Care Management Obs Status (Signed)
 MEDICARE OBSERVATION STATUS NOTIFICATION   Patient Details  Name: Cynthia Vasquez MRN: [id number] Date of Birth: 1945/11/23   Medicare Observation Status Notification Given:  Yes    [name] 04/03/2024, 9:27 AM

## 2024-04-03 NOTE — Progress Notes (Signed)
  Echocardiogram 2D Echocardiogram has been performed.  Thea Norlander 04/03/2024, 8:57 AM

## 2024-04-03 NOTE — Progress Notes (Signed)
 PROGRESS NOTE    Cynthia Vasquez  FMW:[id number] DOB: October 07, 1945 DOA: 04/01/2024 PCP: Delayne Artist PARAS, MD   78/F w hypertension, GERD, breast cancer, anemia presenting with chest pressure, swelling.  She reported chronic dyspnea on exertion, in addition has also reported dizziness recently. - Underwent right hip surgery on 8/20, since then has noticed progressive swelling, worse in the right leg -In the ED hypoxic requiring 2 L O2, creatinine 1.5, hemoglobin 9, WBC 14, troponin 64, 57, proBNP 4355, CTA chest negative for PE, increased subpleural scarring  Subjective: -mild intermittent dizziness  Assessment and Plan:  Acute respite failure with hypoxia Acute CHF, possibly diastolic -Last echo 2021 with preserved EF, normal RV -Repeat echo with normal EF, normal diastolic parameters, mildly reduced RV, mildly elevated PASP - Volume status has improved, switch to oral diuretics today - Avoid GDMT, has orthostatic hypotension -Right lower leg disproportionately swollen, follow-up Dopplers  Orthostatic hypotension -Caution with overdiuresis, compression stockings -Stopped losartan    AKI - Creatinine higher than baseline of 1, likely cardiorenal, monitor with diuresis -Holding losartan    Anemia Leukocytosis -Hemoglobin has trended down since recent surgery, likely related to postop blood loss and some component of hemodilution from volume overload - Panel with iron  deficiency, add IV iron    Recent right hip surgery - Continue oxycodone  and tizanidine  - PT eval   GERD - Continue PPI   Breast cancer - Continue home anastrozole    DVT prophylaxis:      Lovenox  Code Status:              Full Family Communication:    Husband at bedside Disposition Plan: Home pending above workup  Consultants:    Procedures:   Antimicrobials:    Objective: Vitals:   04/02/24 2316 04/03/24 0415 04/03/24 0423 04/03/24 0745  BP: (!) 122/51 (!) 137/46  (!) 143/66  Pulse: 86 84  80  Resp:  16 17  18   Temp: 97.7 F (36.5 C) 98.2 F (36.8 C)  98.7 F (37.1 C)  TempSrc: Oral Oral  Oral  SpO2: 100% 100%  96%  Weight:   65.7 kg   Height:        Intake/Output Summary (Last 24 hours) at 04/03/2024 1108 Last data filed at 04/03/2024 0425 Gross per 24 hour  Intake 240 ml  Output 1500 ml  Net -1260 ml   Filed Weights   04/01/24 0334 04/02/24 0351 04/03/24 0423  Weight: 64.9 kg 66.2 kg 65.7 kg    Examination:  General exam: Appears calm and comfortable  HEENT: no JVD Respiratory system: Decreased at the bases otherwise clear Cardiovascular system: S1 & S2 heard, RRR.  Abd: nondistended, soft and nontender.Normal bowel sounds heard. Central nervous system: Alert and oriented. No focal neurological deficits. Extremities: Trace edema on the right Skin: No rashes Psychiatry:  Mood & affect appropriate.     Data Reviewed:   CBC: Recent Labs  Lab 04/01/24 0345 04/02/24 0230 04/03/24 0300  WBC 14.2* 8.5 8.6  HGB 9.0* 8.5* 8.5*  HCT 27.1* 26.1* 25.8*  MCV 90.3 91.3 91.2  PLT 393 407* 413*   Basic Metabolic Panel: Recent Labs  Lab 04/01/24 0345 04/02/24 0230 04/03/24 0300  NA 136 137 138  K 5.4* 4.3 4.2  CL 99 99 98  CO2 23 27 30   GLUCOSE 97 96 82  BUN 46* 34* 31*  CREATININE 1.56* 1.65* 1.73*  CALCIUM 9.9 8.9 8.9  MG  --  2.6*  --    GFR: Estimated Creatinine  Clearance: 22.4 mL/min (A) (by C-G formula based on SCr of 1.73 mg/dL (H)). Liver Function Tests: Recent Labs  Lab 04/01/24 0345 04/02/24 0230  AST 28 25  ALT 24 21  ALKPHOS 103 68  BILITOT 0.4 0.7  PROT 7.5 6.4*  ALBUMIN 4.1 3.2*   Recent Labs  Lab 04/01/24 0345  LIPASE 26   No results for input(s): AMMONIA in the last 168 hours. Coagulation Profile: No results for input(s): INR, PROTIME in the last 168 hours. Cardiac Enzymes: No results for input(s): CKTOTAL, CKMB, CKMBINDEX, TROPONINI in the last 168 hours. BNP (last 3 results) Recent Labs    04/01/24 0345   PROBNP 4,355.0*   HbA1C: No results for input(s): HGBA1C in the last 72 hours. CBG: Recent Labs  Lab 04/02/24 1334  GLUCAP 120*   Lipid Profile: No results for input(s): CHOL, HDL, LDLCALC, TRIG, CHOLHDL, LDLDIRECT in the last 72 hours. Thyroid  Function Tests: No results for input(s): TSH, T4TOTAL, FREET4, T3FREE, THYROIDAB in the last 72 hours. Anemia Panel: Recent Labs    04/03/24 0300  VITAMINB12 1,342*  FOLATE 18.9  FERRITIN 281  TIBC 267  IRON  32  RETICCTPCT 1.3   Urine analysis:    Component Value Date/Time   COLORURINE YELLOW 06/11/2013 2007   APPEARANCEUR CLEAR 06/11/2013 2007   LABSPEC 1.017 06/11/2013 2007   PHURINE 6.5 06/11/2013 2007   GLUCOSEU NEGATIVE 06/11/2013 2007   HGBUR NEGATIVE 06/11/2013 2007   BILIRUBINUR NEGATIVE 06/11/2013 2007   KETONESUR NEGATIVE 06/11/2013 2007   PROTEINUR NEGATIVE 06/11/2013 2007   UROBILINOGEN 0.2 06/11/2013 2007   NITRITE NEGATIVE 06/11/2013 2007   LEUKOCYTESUR NEGATIVE 06/11/2013 2007   Sepsis Labs: @LABRCNTIP (procalcitonin:4,lacticidven:4)  )No results found for this or any previous visit (from the past 240 hours).   Radiology Studies: ECHOCARDIOGRAM COMPLETE Result Date: 04/03/2024    ECHOCARDIOGRAM REPORT   Patient Name:   Cynthia Vasquez Date of Exam: 04/03/2024 Medical Rec #:  [id number]      Height:       59.0 in Accession #:    [id number]     Weight:       144.8 lb Date of Birth:  1946-07-15     BSA:          1.608 m Patient Age:    78 years       BP:           137/46 mmHg Patient Gender: F              HR:           69 bpm. Exam Location:  Inpatient Procedure: 2D Echo, Cardiac Doppler, Color Doppler and Intracardiac            Opacification Agent (Both Spectral and Color Flow Doppler were            utilized during procedure). Indications:    CHF-Acute Systolic I50.21  History:        Patient has prior history of Echocardiogram examinations, most                 recent 02/08/2020. CHF; Risk  Factors:Hypertension.  Sonographer:    Thea Norlander RCS Referring Phys: [id number] MARSA NOVAK MELVIN IMPRESSIONS  1. Left ventricular ejection fraction, by estimation, is 60 to 65%. The left ventricle has normal function. The left ventricle has no regional wall motion abnormalities. Left ventricular diastolic parameters were normal.  2. Right ventricular systolic function is mildly reduced. The right ventricular size is mildly  enlarged. There is mildly elevated pulmonary artery systolic pressure. The estimated right ventricular systolic pressure is 37.2 mmHg.  3. Left atrial size was moderately dilated.  4. Right atrial size was moderately dilated.  5. The mitral valve is normal in structure. No evidence of mitral valve regurgitation. No evidence of mitral stenosis.  6. Tricuspid valve regurgitation is moderate.  7. The aortic valve is tricuspid. There is mild calcification of the aortic valve. There is mild thickening of the aortic valve. Aortic valve regurgitation is not visualized. Aortic valve sclerosis/calcification is present, without any evidence of aortic stenosis.  8. The inferior vena cava is dilated in size with >50% respiratory variability, suggesting right atrial pressure of 8 mmHg. FINDINGS  Left Ventricle: Left ventricular ejection fraction, by estimation, is 60 to 65%. The left ventricle has normal function. The left ventricle has no regional wall motion abnormalities. Definity  contrast agent was given IV to delineate the left ventricular  endocardial borders. The left ventricular internal cavity size was normal in size. There is no left ventricular hypertrophy. Left ventricular diastolic parameters were normal. Normal left ventricular filling pressure. Right Ventricle: The right ventricular size is mildly enlarged. No increase in right ventricular wall thickness. Right ventricular systolic function is mildly reduced. There is mildly elevated pulmonary artery systolic pressure. The tricuspid  regurgitant  velocity is 2.70 m/s, and with an assumed right atrial pressure of 8 mmHg, the estimated right ventricular systolic pressure is 37.2 mmHg. Left Atrium: Left atrial size was moderately dilated. Right Atrium: Right atrial size was moderately dilated. Pericardium: There is no evidence of pericardial effusion. Mitral Valve: The mitral valve is normal in structure. No evidence of mitral valve regurgitation. No evidence of mitral valve stenosis. Tricuspid Valve: The tricuspid valve is normal in structure. Tricuspid valve regurgitation is moderate . No evidence of tricuspid stenosis. Aortic Valve: The aortic valve is tricuspid. There is mild calcification of the aortic valve. There is mild thickening of the aortic valve. Aortic valve regurgitation is not visualized. Aortic valve sclerosis/calcification is present, without any evidence of aortic stenosis. Aortic valve peak gradient measures 8.8 mmHg. Pulmonic Valve: The pulmonic valve was normal in structure. Pulmonic valve regurgitation is not visualized. No evidence of pulmonic stenosis. Aorta: The aortic root is normal in size and structure. Venous: The inferior vena cava is dilated in size with greater than 50% respiratory variability, suggesting right atrial pressure of 8 mmHg. IAS/Shunts: No atrial level shunt detected by color flow Doppler.  LEFT VENTRICLE PLAX 2D LVIDd:         4.40 cm   Diastology LVIDs:         2.60 cm   LV e' medial:    9.46 cm/s LV PW:         0.90 cm   LV E/e' medial:  8.4 LV IVS:        0.90 cm   LV e' lateral:   10.30 cm/s LVOT diam:     1.90 cm   LV E/e' lateral: 7.7 LV SV:         66 LV SV Index:   41 LVOT Area:     2.84 cm  RIGHT VENTRICLE            IVC RV S prime:     8.70 cm/s  IVC diam: 2.20 cm TAPSE (M-mode): 1.4 cm LEFT ATRIUM             Index        RIGHT ATRIUM  Index LA diam:        4.00 cm 2.49 cm/m   RA Area:     15.90 cm LA Vol (A2C):   61.5 ml 38.25 ml/m  RA Volume:   45.70 ml  28.42 ml/m LA Vol  (A4C):   99.6 ml 61.94 ml/m LA Biplane Vol: 79.0 ml 49.13 ml/m  AORTIC VALVE AV Area (Vmax): 1.95 cm AV Vmax:        148.00 cm/s AV Peak Grad:   8.8 mmHg LVOT Vmax:      102.00 cm/s LVOT Vmean:     68.700 cm/s LVOT VTI:       0.233 m  AORTA Ao Root diam: 3.30 cm Ao Asc diam:  3.20 cm MITRAL VALVE                TRICUSPID VALVE MV Area (PHT): 3.08 cm     TR Peak grad:   29.2 mmHg MV Decel Time: 246 msec     TR Vmax:        270.00 cm/s MV E velocity: 79.70 cm/s MV A velocity: 106.00 cm/s  SHUNTS MV E/A ratio:  0.75         Systemic VTI:  0.23 m                             Systemic Diam: 1.90 cm Mihai Croitoru MD Electronically signed by Jerel Balding MD Signature Date/Time: 04/03/2024/10:47:51 AM    Final      Scheduled Meds:  anastrozole   1 mg Oral Daily   enoxaparin  (LOVENOX ) injection  30 mg Subcutaneous Q24H   furosemide   40 mg Intravenous BID   latanoprost   1 drop Both Eyes QHS   pantoprazole   40 mg Oral QODAY   sodium chloride  flush  3 mL Intravenous Q12H   Continuous Infusions:   LOS: 2 days    Time spent:    Sigurd Pac, MD [facility]   04/03/2024, 11:08 AM

## 2024-04-03 NOTE — Evaluation (Signed)
 Physical Therapy Evaluation Patient Details Name: Cynthia Vasquez MRN: [id number] DOB: [birth year] Today's Date: 04/03/2024  History of Present Illness  The pt is a 78 yo female presenting 8/30 with chest pain. Work up revealed acute CHF and acute respiratory failure with hypoxia. Pt with unresponsive episode and soft BP on 8/31, improved with narcan  and fluids. 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   Clinical Impression  Pt in bed upon arrival of PT, agreeable to evaluation at this time. Prior to admission the pt was ambulating intermittently with RW, reports no falls since surgery, and assist from spouse for stairs and ADLs. The pt was active with OPPT but interrupted by hospitalization. Pt with 13 stairs to reach bedroom/bathroom and could benefit from additional session to complete stair training, but is safe to return home with spouse support and resuming OPPT. The pt is largely mobilizing on supervision to CGA level with RW at this time. Of note, pt with cog deficits noted in memory, problem solving, and safety awareness, with multiple inconsistent reports of falls and PLOF through session. Recommend spouse present to supervise given cog impairments and to manage medications to ensure accuracy.      If plan is discharge home, recommend the following: A little help with walking and/or transfers;A little help with bathing/dressing/bathroom;Assistance with cooking/housework;Assist for transportation;Help with stairs or ramp for entrance;Supervision due to cognitive status   Can travel by private vehicle        Equipment Recommendations  (tub bench)  Recommendations for Other Services       Functional Status Assessment Patient has had a recent decline in their functional status and demonstrates the ability to make significant improvements in function in a reasonable and predictable amount of time.     Precautions / Restrictions Precautions Precautions:  Fall Recall of Precautions/Restrictions: Impaired Restrictions Weight Bearing Restrictions Per Provider Order: No      Mobility  Bed Mobility Overal bed mobility: Needs Assistance Bed Mobility: Supine to Sit     Supine to sit: Supervision     General bed mobility comments: increased time, use of rails    Transfers Overall transfer level: Needs assistance Equipment used: None Transfers: Sit to/from Stand, Bed to chair/wheelchair/BSC Sit to Stand: Supervision   Step pivot transfers: Supervision       General transfer comment: no assist, able to rise and steady without UE support    Ambulation/Gait Ambulation/Gait assistance: Contact guard assist, Supervision Gait Distance (Feet): 150 Feet Assistive device: Rolling walker (2 wheels) Gait Pattern/deviations: Step-through pattern, Decreased stride length, Antalgic, Trunk flexed Gait velocity: decreased Gait velocity interpretation: <1.31 ft/sec, indicative of household ambulator   General Gait Details: pt with preference for walking behind RW with arms extended, unable to maintain within RW despite repeated cues. VSS  Stairs Stairs:  (discussed verbally)             Balance Overall balance assessment: Needs assistance Sitting-balance support: No upper extremity supported, Feet supported Sitting balance-Leahy Scale: Good     Standing balance support: During functional activity, No upper extremity supported Standing balance-Leahy Scale: Fair Standing balance comment: can ambulate short distance without UE support but reaches for single UE support on furniture                             Pertinent Vitals/Pain Pain Assessment Pain Assessment: No/denies pain    Home Living Family/patient expects to be discharged to::  Private residence Living Arrangements: Spouse/significant other Available Help at Discharge: Family;Available 24 hours/day Type of Home: House Home Access: Stairs to enter Entrance  Stairs-Rails: None Entrance Stairs-Number of Steps: 1 Alternate Level Stairs-Number of Steps: 13 Home Layout: Multi-level;Bed/bath upstairs;1/2 bath on main level Home Equipment: Agricultural consultant (2 wheels);Hand held shower head Additional Comments: pt uses towel bar on shower door to balance while entering tub shower, assist from spouse    Prior Function Prior Level of Function : Needs assist             Mobility Comments: before hip surgery, no DME or assist. pt has has multiple falls. no falls since sugery, using RW intermittently at home ADLs Comments: pt needing assist after surgery but previously independent. has showered x2 since hip surgery     Extremity/Trunk Assessment   Upper Extremity Assessment Upper Extremity Assessment: Overall WFL for tasks assessed    Lower Extremity Assessment Lower Extremity Assessment: RLE deficits/detail RLE Deficits / Details: grossly 3+/5 to MMT due to pain, weakest at hip and knee although ankle also impaired. reports R ankle falling asleep when she is in bed after hip surgery, improved with moving foot or sitting up RLE: Unable to fully assess due to pain RLE Coordination: WNL    Cervical / Trunk Assessment Cervical / Trunk Assessment: Normal  Communication   Communication Communication: No apparent difficulties    Cognition Arousal: Alert Behavior During Therapy: WFL for tasks assessed/performed, Impulsive   PT - Cognitive impairments: Awareness, Memory, Problem solving, Safety/Judgement                       PT - Cognition Comments: pt with inconsistent answers in session, difficulty relaying when falls were before hip surgery vs after. poor ability to follow cues for use of RW Following commands: Impaired Following commands impaired: Follows one step commands inconsistently, Follows one step commands with increased time     Cueing Cueing Techniques: Verbal cues, Gestural cues, Tactile cues     General Comments  General comments (skin integrity, edema, etc.): VSS on RA    Exercises     Assessment/Plan    PT Assessment Patient needs continued PT services  PT Problem List Decreased strength;Decreased activity tolerance;Decreased range of motion;Decreased balance;Decreased mobility;Decreased cognition;Decreased knowledge of use of DME;Decreased safety awareness;Pain       PT Treatment Interventions DME instruction;Gait training;Stair training;Functional mobility training;Therapeutic activities;Therapeutic exercise;Balance training;Cognitive remediation;Patient/family education    PT Goals (Current goals can be found in the Care Plan section)  Acute Rehab PT Goals Patient Stated Goal: return home PT Goal Formulation: With patient/family Time For Goal Achievement: 04/17/24 Potential to Achieve Goals: Good    Frequency Min 2X/week        AM-PAC PT 6 Clicks Mobility  Outcome Measure Help needed turning from your back to your side while in a flat bed without using bedrails?: A Little Help needed moving from lying on your back to sitting on the side of a flat bed without using bedrails?: A Little Help needed moving to and from a bed to a chair (including a wheelchair)?: A Little Help needed standing up from a chair using your arms (e.g., wheelchair or bedside chair)?: A Little Help needed to walk in hospital room?: A Little Help needed climbing 3-5 steps with a railing? : A Lot 6 Click Score: 17    End of Session Equipment Utilized During Treatment: Gait belt Activity Tolerance: Patient tolerated treatment well Patient left: in  chair;with call bell/phone within reach;with chair alarm set Nurse Communication: Mobility status PT Visit Diagnosis: Unsteadiness on feet (R26.81);Other abnormalities of gait and mobility (R26.89);Repeated falls (R29.6);Muscle weakness (generalized) (M62.81);Pain Pain - Right/Left: Right Pain - part of body: Hip    Time: 1210-1244 PT Time Calculation (min)  (ACUTE ONLY): 34 min   Charges:   PT Evaluation $PT Eval Moderate Complexity: 1 Mod PT Treatments $Therapeutic Exercise: 8-22 mins PT General Charges $$ ACUTE PT VISIT: 1 Visit         Izetta Call, PT, DPT   Acute Rehabilitation Department Office [phone] Secure Chat Communication Preferred  Izetta JULIANNA Call 04/03/2024, 1:16 PM

## 2024-04-03 NOTE — Progress Notes (Signed)
 VASCULAR LAB    Right lower extremity venous duplex has been performed.  See CV proc for preliminary results.   Ori Kreiter, RVT 04/03/2024, 9:09 AM

## 2024-04-03 NOTE — Progress Notes (Signed)
 Mobility Specialist Progress Note:    04/03/24 1516  Mobility  Activity Ambulated with assistance  Level of Assistance Independent after set-up  Assistive Device Front wheel walker  Distance Ambulated (ft) 250 ft  Activity Response Tolerated well  Mobility Referral Yes  Mobility visit 1 Mobility  Mobility Specialist Start Time (ACUTE ONLY) 1516  Mobility Specialist Stop Time (ACUTE ONLY) 1534  Mobility Specialist Time Calculation (min) (ACUTE ONLY) 18 min   Received pt coming out of bathroom agreeable to session. No c/o any symptoms, but needed to take 2 standing break to make it back to room. Pt moving and ambulating well. Returned pt w/ husband in room and all needs met.   Venetia Keel Mobility Specialist Please Neurosurgeon or Rehab Office at [phone]

## 2024-04-03 NOTE — Plan of Care (Signed)

## 2024-04-03 NOTE — Plan of Care (Signed)
 Patient is ready to go home, pending results from venous scan on R leg.

## 2024-04-04 ENCOUNTER — Encounter: Payer: Self-pay | Admitting: Oncology

## 2024-04-04 ENCOUNTER — Other Ambulatory Visit (HOSPITAL_COMMUNITY): Payer: Self-pay

## 2024-04-04 ENCOUNTER — Telehealth (HOSPITAL_COMMUNITY): Payer: Self-pay | Admitting: Pharmacy Technician

## 2024-04-04 DIAGNOSIS — I509 Heart failure, unspecified: Secondary | ICD-10-CM | POA: Diagnosis not present

## 2024-04-04 LAB — BASIC METABOLIC PANEL WITH GFR
Anion gap: 10 (ref 5–15)
BUN: 25 mg/dL — ABNORMAL HIGH (ref 8–23)
CO2: 30 mmol/L (ref 22–32)
Calcium: 8.6 mg/dL — ABNORMAL LOW (ref 8.9–10.3)
Chloride: 97 mmol/L — ABNORMAL LOW (ref 98–111)
Creatinine, Ser: 1.3 mg/dL — ABNORMAL HIGH (ref 0.44–1.00)
GFR, Estimated: 42 mL/min — ABNORMAL LOW (ref >60)
Glucose, Bld: 95 mg/dL (ref 70–99)
Potassium: 3.8 mmol/L (ref 3.5–5.1)
Sodium: 137 mmol/L (ref 135–145)

## 2024-04-04 LAB — CBC
HCT: 24.5 % — ABNORMAL LOW (ref 36.0–46.0)
Hemoglobin: 8.1 g/dL — ABNORMAL LOW (ref 12.0–15.0)
MCH: 30 pg (ref 26.0–34.0)
MCHC: 33.1 g/dL (ref 30.0–36.0)
MCV: 90.7 fL (ref 80.0–100.0)
Platelets: 429 K/uL — ABNORMAL HIGH (ref 150–400)
RBC: 2.7 MIL/uL — ABNORMAL LOW (ref 3.87–5.11)
RDW: 13.4 % (ref 11.5–15.5)
WBC: 9.6 K/uL (ref 4.0–10.5)
nRBC: 0 % (ref 0.0–0.2)

## 2024-04-04 MED ORDER — FUROSEMIDE 20 MG PO TABS
20.0000 mg | ORAL_TABLET | Freq: Every day | ORAL | Status: DC
  Administered 2024-04-04: 20 mg via ORAL

## 2024-04-04 MED ORDER — EMPAGLIFLOZIN 10 MG PO TABS
10.0000 mg | ORAL_TABLET | Freq: Every day | ORAL | 0 refills | Status: AC
  Filled 2024-04-04: qty 30, 30d supply, fill #0

## 2024-04-04 MED ORDER — FUROSEMIDE 20 MG PO TABS
20.0000 mg | ORAL_TABLET | ORAL | 1 refills | Status: AC | PRN
Start: 2024-04-04 — End: ?
  Filled 2024-04-04: qty 30, 30d supply, fill #0

## 2024-04-04 MED ORDER — EMPAGLIFLOZIN 10 MG PO TABS
10.0000 mg | ORAL_TABLET | Freq: Every day | ORAL | Status: DC
  Administered 2024-04-04: 10 mg via ORAL
  Filled 2024-04-04: qty 1

## 2024-04-04 NOTE — Progress Notes (Signed)
 Mobility Specialist Progress Note:    04/04/24 0938  Mobility  Activity Ambulated with assistance  Level of Assistance Standby assist, set-up cues, supervision of patient - no hands on  Assistive Device None  Distance Ambulated (ft) 25 ft  Activity Response Tolerated well  Mobility Referral Yes  Mobility visit 1 Mobility  Mobility Specialist Start Time (ACUTE ONLY) [id number]  Mobility Specialist Stop Time (ACUTE ONLY) 0945  Mobility Specialist Time Calculation (min) (ACUTE ONLY) 7 min   Received pt in recliner waiting to be d/c. No c/o any symptoms. Pt willing to ambulate in room while leaving. Returned pt to recliner w/ all needs met.   Venetia Keel Mobility Specialist Please Neurosurgeon or Rehab Office at [phone]

## 2024-04-04 NOTE — Discharge Summary (Signed)
 Physician Discharge Summary  CHONG JANUARY FMW:[id number] DOB: 07-25-1946 DOA: 04/01/2024  PCP: Delayne Artist PARAS, MD  Admit date: 04/01/2024 Discharge date: 04/04/2024  Time spent: 45 minutes  Recommendations for Outpatient Follow-up:  [facility] in 1 to 2 weeks  Discharge Diagnoses:  Orthostatic hypotension Acute hypoxic respiratory failure Acute diastolic CHF Recent right hip surgery Iron  deficiency anemia   Hypertension   GERD (gastroesophageal reflux disease)   Malignant neoplasm of upper-outer quadrant of left breast in female, estrogen receptor positive [facility])   Discharge Condition: Improved  Diet recommendation: Low sodium, heart healthy  Filed Weights   04/02/24 0351 04/03/24 0423 04/04/24 0333  Weight: 66.2 kg 65.7 kg 65.4 kg    History of present illness:  78/F w hypertension, GERD, breast cancer, anemia presenting with chest pressure, swelling.  She reported chronic dyspnea on exertion, in addition has also reported dizziness recently. - Underwent right hip surgery on 8/20, since then has noticed progressive swelling, worse in the right leg -In the ED hypoxic requiring 2 L O2, creatinine 1.5, hemoglobin 9, WBC 14, troponin 64, 57, proBNP 4355, CTA chest negative for PE, increased subpleural scarring  Hospital Course:   Acute respite failure with hypoxia Acute diastolic CHF -Last echo 2021 with preserved EF, normal RV -Repeat echo with normal EF, normal diastolic parameters, mildly reduced RV, mildly elevated PASP - Improved with diuresis, 6 L negative, weaned off O2 -GDMT limited by hypotension and significant orthostatic issues -Transition to Jardiance  at discharge with Lasix  as needed - Follow-up with [facility]   Orthostatic hypotension -Caution with overdiuresis, compression stockings -Stopped losartan    AKI - Creatinine higher than baseline of 1, likely cardiorenal, monitor with diuresis -Holding losartan    Anemia Leukocytosis -Hemoglobin  has trended down since recent surgery, likely related to postop blood loss and some component of hemodilution from volume overload - Panel with iron  deficiency, given IV iron    Recent right hip surgery - Continue oxycodone  and tizanidine  - PT eval completed, ambulating   GERD - Continue PPI   Breast cancer - Continue home anastrozole   Discharge Exam: Vitals:   04/04/24 0333 04/04/24 0735  BP: (!) 134/52 (!) 107/47  Pulse:  77  Resp: 20 15  Temp: 98.3 F (36.8 C) 98.1 F (36.7 C)  SpO2: 92% 99%   Gen: Awake, Alert, Oriented X 3,  HEENT: no JVD Lungs: Good air movement bilaterally, CTAB CVS: S1S2/RRR Abd: soft, Non tender, non distended, BS present Extremities: No edema Skin: no new rashes on exposed skin   Discharge Instructions   Discharge Instructions     Ambulatory referral to Cardiology   Complete by: As directed    New diastolic CHF   Diet - low sodium heart healthy   Complete by: As directed    Increase activity slowly   Complete by: As directed       Allergies as of 04/04/2024   No Known Allergies      Medication List     STOP taking these medications    celecoxib 200 MG capsule Commonly known as: CELEBREX   hydrochlorothiazide  25 MG tablet Commonly known as: HYDRODIURIL    losartan  50 MG tablet Commonly known as: COZAAR    naproxen  sodium 220 MG tablet Commonly known as: Aleve        TAKE these medications    acetaminophen  500 MG tablet Commonly known as: TYLENOL  Take 1 tablet (500 mg total) by mouth every 8 (eight) hours as needed. Take with aleve  220 md  aspirin  EC 325 MG tablet Take 325 mg by mouth daily.   CALCIUM 500 PO Take 1 tablet by mouth in the morning and at bedtime.   cholecalciferol  1000 units tablet Commonly known as: VITAMIN D Take 1,000 Units by mouth 2 (two) times daily as needed (supplementation).   COLLAGEN PO Take 3,000 mg by mouth in the morning and at bedtime. [employer] Collagen Supplement    CYANOCOBALAMIN  PO Take 1 capsule by mouth daily. Dosage unknown   empagliflozin  10 MG Tabs tablet Commonly known as: JARDIANCE  Take 1 tablet (10 mg total) by mouth daily.   EQUATE STOOL SOFTENER 100 MG capsule Generic drug: docusate sodium Take 100 mg by mouth 2 (two) times daily.   furosemide  20 MG tablet Commonly known as: LASIX  Take 1 tablet (20 mg total) by mouth as needed for fluid or edema (and weight gain of 3lb in 1day or 5lb in 1 week).   latanoprost  0.005 % ophthalmic solution Commonly known as: XALATAN  Place 1 drop into both eyes at bedtime.   magnesium  oxide 400 (240 Mg) MG tablet Commonly known as: MAG-OX Take 400 mg by mouth at bedtime.   NEURIVA PO Take 1 capsule by mouth daily.   Omega 3-6-9 Complex Caps Take 1 capsule by mouth in the morning and at bedtime.   omeprazole 20 MG capsule Commonly known as: PRILOSEC Take 20 mg by mouth daily.   oxyCODONE  5 MG immediate release tablet Commonly known as: Oxy IR/ROXICODONE  Take 5 mg by mouth every 4 (four) hours as needed.   THIAMINE PO Take 1 tablet by mouth daily. Dosage unknown   tiZANidine  2 MG tablet Commonly known as: ZANAFLEX  Take 2 mg by mouth every 8 (eight) hours as needed.   Turmeric 500 MG Tabs Take 1,500 mg by mouth daily.       No Known Allergies  Follow-up Information     Madden, Evan J, MD Follow up.   Specialty: Family Medicine Why: Please follow up in a week. Contact information: [address] [place] [address] [phone]         Cardiology Follow up in 2 week(s).   Why: Referral sent                 The results of significant diagnostics from this hospitalization (including imaging, microbiology, ancillary and laboratory) are listed below for reference.    Significant Diagnostic Studies: VAS US  LOWER EXTREMITY VENOUS (DVT) Result Date: 04/03/2024  Lower Venous DVT Study Patient Name:  SEMIYAH NEWGENT  Date of Exam:   04/03/2024 Medical Rec #:  [id number]       Accession #:    [id number] Date of Birth: 01/15/1946      Patient Gender: F Patient Age:   78 years Exam Location:  [facility] Procedure:      VAS US  LOWER EXTREMITY VENOUS (DVT) Referring Phys: SIGURD PAC --------------------------------------------------------------------------------  Indications: Pain, and Swelling. 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 Risk Factors: Cancer Breast cancer. Limitations: Patient unable to rotate leg for popliteal vein compression. Comparison Study: Prior negative bilateral LEV done 02/12/22 Performing Technologist: Alberta Lis RVS  Examination Guidelines: A complete evaluation includes B-mode imaging, spectral Doppler, color Doppler, and power Doppler as needed of all accessible portions of each vessel. Bilateral testing is considered an integral part of a complete examination. Limited examinations for reoccurring indications may be performed as noted. The reflux portion of the exam is performed with the patient in reverse Trendelenburg.  +---------+---------------+---------+-----------+----------+-------------------+  RIGHT    CompressibilityPhasicitySpontaneityPropertiesThrombus Aging      +---------+---------------+---------+-----------+----------+-------------------+ CFV      Full           Yes      No                                       +---------+---------------+---------+-----------+----------+-------------------+ SFJ      Full                                                             +---------+---------------+---------+-----------+----------+-------------------+ FV Prox  Full           Yes      No                                       +---------+---------------+---------+-----------+----------+-------------------+ FV Mid   Full                                                             +---------+---------------+---------+-----------+----------+-------------------+ FV DistalFull            Yes      No                                       +---------+---------------+---------+-----------+----------+-------------------+ PFV      Full                                                             +---------+---------------+---------+-----------+----------+-------------------+ POP                     Yes      No                   Patent by color and                                                       Doppler             +---------+---------------+---------+-----------+----------+-------------------+ PTV      Full                                                             +---------+---------------+---------+-----------+----------+-------------------+ PERO     Full                                                             +---------+---------------+---------+-----------+----------+-------------------+   +----+---------------+---------+-----------+----------+--------------+  LEFTCompressibilityPhasicitySpontaneityPropertiesThrombus Aging +----+---------------+---------+-----------+----------+--------------+ CFV Full           Yes      No                                  +----+---------------+---------+-----------+----------+--------------+ SFJ Full                                                        +----+---------------+---------+-----------+----------+--------------+    Summary: RIGHT: - There is no evidence of deep vein thrombosis in the lower extremity.  Pulsatile waveforms noted throughout  LEFT: - No evidence of common femoral vein obstruction.  Pulsatile waveforms noted.  *See table(s) above for measurements and observations.    Preliminary    ECHOCARDIOGRAM COMPLETE Result Date: 04/03/2024    ECHOCARDIOGRAM REPORT   Patient Name:   JAIMARIE RAPOZO Date of Exam: 04/03/2024 Medical Rec #:  [id number]      Height:       59.0 in Accession #:    [id number]     Weight:       144.8 lb Date of Birth:  Aug 20, 1945     BSA:          1.608 m  Patient Age:    77 years       BP:           137/46 mmHg Patient Gender: F              HR:           69 bpm. Exam Location:  Inpatient Procedure: 2D Echo, Cardiac Doppler, Color Doppler and Intracardiac            Opacification Agent (Both Spectral and Color Flow Doppler were            utilized during procedure). Indications:    CHF-Acute Systolic I50.21  History:        Patient has prior history of Echocardiogram examinations, most                 recent 02/08/2020. CHF; Risk Factors:Hypertension.  Sonographer:    Thea Norlander RCS Referring Phys: [id number] MARSA NOVAK MELVIN IMPRESSIONS  1. Left ventricular ejection fraction, by estimation, is 60 to 65%. The left ventricle has normal function. The left ventricle has no regional wall motion abnormalities. Left ventricular diastolic parameters were normal.  2. Right ventricular systolic function is mildly reduced. The right ventricular size is mildly enlarged. There is mildly elevated pulmonary artery systolic pressure. The estimated right ventricular systolic pressure is 37.2 mmHg.  3. Left atrial size was moderately dilated.  4. Right atrial size was moderately dilated.  5. The mitral valve is normal in structure. No evidence of mitral valve regurgitation. No evidence of mitral stenosis.  6. Tricuspid valve regurgitation is moderate.  7. The aortic valve is tricuspid. There is mild calcification of the aortic valve. There is mild thickening of the aortic valve. Aortic valve regurgitation is not visualized. Aortic valve sclerosis/calcification is present, without any evidence of aortic stenosis.  8. The inferior vena cava is dilated in size with >50% respiratory variability, suggesting right atrial pressure of 8 mmHg. FINDINGS  Left Ventricle: Left ventricular ejection fraction, by estimation, is 60 to 65%. The left ventricle has normal  function. The left ventricle has no regional wall motion abnormalities. Definity  contrast agent was given IV to delineate the  left ventricular  endocardial borders. The left ventricular internal cavity size was normal in size. There is no left ventricular hypertrophy. Left ventricular diastolic parameters were normal. Normal left ventricular filling pressure. Right Ventricle: The right ventricular size is mildly enlarged. No increase in right ventricular wall thickness. Right ventricular systolic function is mildly reduced. There is mildly elevated pulmonary artery systolic pressure. The tricuspid regurgitant  velocity is 2.70 m/s, and with an assumed right atrial pressure of 8 mmHg, the estimated right ventricular systolic pressure is 37.2 mmHg. Left Atrium: Left atrial size was moderately dilated. Right Atrium: Right atrial size was moderately dilated. Pericardium: There is no evidence of pericardial effusion. Mitral Valve: The mitral valve is normal in structure. No evidence of mitral valve regurgitation. No evidence of mitral valve stenosis. Tricuspid Valve: The tricuspid valve is normal in structure. Tricuspid valve regurgitation is moderate . No evidence of tricuspid stenosis. Aortic Valve: The aortic valve is tricuspid. There is mild calcification of the aortic valve. There is mild thickening of the aortic valve. Aortic valve regurgitation is not visualized. Aortic valve sclerosis/calcification is present, without any evidence of aortic stenosis. Aortic valve peak gradient measures 8.8 mmHg. Pulmonic Valve: The pulmonic valve was normal in structure. Pulmonic valve regurgitation is not visualized. No evidence of pulmonic stenosis. Aorta: The aortic root is normal in size and structure. Venous: The inferior vena cava is dilated in size with greater than 50% respiratory variability, suggesting right atrial pressure of 8 mmHg. IAS/Shunts: No atrial level shunt detected by color flow Doppler.  LEFT VENTRICLE PLAX 2D LVIDd:         4.40 cm   Diastology LVIDs:         2.60 cm   LV e' medial:    9.46 cm/s LV PW:         0.90 cm   LV E/e'  medial:  8.4 LV IVS:        0.90 cm   LV e' lateral:   10.30 cm/s LVOT diam:     1.90 cm   LV E/e' lateral: 7.7 LV SV:         66 LV SV Index:   41 LVOT Area:     2.84 cm  RIGHT VENTRICLE            IVC RV S prime:     8.70 cm/s  IVC diam: 2.20 cm TAPSE (M-mode): 1.4 cm LEFT ATRIUM             Index        RIGHT ATRIUM           Index LA diam:        4.00 cm 2.49 cm/m   RA Area:     15.90 cm LA Vol (A2C):   61.5 ml 38.25 ml/m  RA Volume:   45.70 ml  28.42 ml/m LA Vol (A4C):   99.6 ml 61.94 ml/m LA Biplane Vol: 79.0 ml 49.13 ml/m  AORTIC VALVE AV Area (Vmax): 1.95 cm AV Vmax:        148.00 cm/s AV Peak Grad:   8.8 mmHg LVOT Vmax:      102.00 cm/s LVOT Vmean:     68.700 cm/s LVOT VTI:       0.233 m  AORTA Ao Root diam: 3.30 cm Ao Asc diam:  3.20 cm MITRAL VALVE  TRICUSPID VALVE MV Area (PHT): 3.08 cm     TR Peak grad:   29.2 mmHg MV Decel Time: 246 msec     TR Vmax:        270.00 cm/s MV E velocity: 79.70 cm/s MV A velocity: 106.00 cm/s  SHUNTS MV E/A ratio:  0.75         Systemic VTI:  0.23 m                             Systemic Diam: 1.90 cm Jerel Croitoru MD Electronically signed by Jerel Balding MD Signature Date/Time: 04/03/2024/10:47:51 AM    Final    CT Angio Chest Pulmonary Embolism (PE) W or WO Contrast Result Date: 04/01/2024 CLINICAL DATA:  78 year old female with recent hip surgery, chest pain onset 0200 hours, high probability of pulmonary embolus. Acute renal insufficiency. History of breast cancer also. EXAM: CT ANGIOGRAPHY CHEST WITH CONTRAST TECHNIQUE: Multidetector CT imaging of the chest was performed using the standard protocol during bolus administration of intravenous contrast. Multiplanar CT image reconstructions and MIPs were obtained to evaluate the vascular anatomy. RADIATION DOSE REDUCTION: This exam was performed according to the departmental dose-optimization program which includes automated exposure control, adjustment of the mA and/or kV according to patient size  and/or use of iterative reconstruction technique. CONTRAST:  60mL OMNIPAQUE  IOHEXOL  350 MG/ML SOLN COMPARISON:  Portable chest x-ray 02/02/2020. and CT chest 01/25/2020. FINDINGS: Cardiovascular: Good contrast bolus timing in the pulmonary arterial tree. No pulmonary artery filling defect identified. Tortuous thoracic aorta, especially the descending segment. Calcified aortic atherosclerosis. Cardiomegaly. No pericardial effusion. Calcified coronary artery atherosclerosis (series 6, image 132). Mediastinum/Nodes: Small gastric hiatal hernia. Negative for mediastinal mass or lymphadenopathy. Lungs/Pleura: Mildly lower lung volumes compared to 2021. New subpleural anterior lung scarring greater on the left. Major airways remain patent. No pleural effusion. Enhancing and platelike atelectasis in the left lower lobe. Stable 4 mm left lower lobe lung nodule since 2021 series 7, image 90, and stable similarright lower lobe nodules up to 3-4 mm on image 86 (no follow-up imaging recommended). no acute or suspicious lung opacity. Upper Abdomen: Negative visible mostly noncontrast liver, spleen, pancreas, adrenal glands and upper abdominal bowel. Exophytic simple fluid density left renal cyst is chronic (no follow-up imaging recommended). Musculoskeletal: Chronically advanced thoracic spine degeneration, exaggerated thoracic kyphosis. Increased vacuum disc since 2021. No acute or suspicious osseous lesion identified. Postoperative changes to the left breast. Left axillary node dissection changes. Review of the MIP images confirms the above findings. IMPRESSION: 1. Negative for acute pulmonary embolus. 2. Increased subpleural lung scarring since 2021 is probably sequelae of interval breast cancer treatment. Atelectasis. No acute finding in the Chest. 3. Calcified coronary artery,  Aortic Atherosclerosis (ICD10-I70.0). Electronically Signed   By: VEAR Hurst M.D.   On: 04/01/2024 05:38    Microbiology: No results found for this  or any previous visit (from the past 240 hours).   Labs: Basic Metabolic Panel: Recent Labs  Lab 04/01/24 0345 04/02/24 0230 04/03/24 0300 04/04/24 0307  NA 136 137 138 137  K 5.4* 4.3 4.2 3.8  CL 99 99 98 97*  CO2 23 27 30 30   GLUCOSE 97 96 82 95  BUN 46* 34* 31* 25*  CREATININE 1.56* 1.65* 1.73* 1.30*  CALCIUM 9.9 8.9 8.9 8.6*  MG  --  2.6*  --   --    Liver Function Tests: Recent Labs  Lab 04/01/24 0345 04/02/24  0230  AST 28 25  ALT 24 21  ALKPHOS 103 68  BILITOT 0.4 0.7  PROT 7.5 6.4*  ALBUMIN 4.1 3.2*   Recent Labs  Lab 04/01/24 0345  LIPASE 26   No results for input(s): AMMONIA in the last 168 hours. CBC: Recent Labs  Lab 04/01/24 0345 04/02/24 0230 04/03/24 0300 04/04/24 0307  WBC 14.2* 8.5 8.6 9.6  HGB 9.0* 8.5* 8.5* 8.1*  HCT 27.1* 26.1* 25.8* 24.5*  MCV 90.3 91.3 91.2 90.7  PLT 393 407* 413* 429*   Cardiac Enzymes: No results for input(s): CKTOTAL, CKMB, CKMBINDEX, TROPONINI in the last 168 hours. BNP: BNP (last 3 results) No results for input(s): BNP in the last 8760 hours.  ProBNP (last 3 results) Recent Labs    04/01/24 0345  PROBNP 4,355.0*    CBG: Recent Labs  Lab 04/02/24 1334  GLUCAP 120*       Signed:  Sigurd Pac MD.  [facility] 04/04/2024, 9:42 AM

## 2024-04-04 NOTE — TOC Transition Note (Signed)
 Transition of Care [facility]) - Discharge Note   Patient Details  Name: Cynthia Vasquez MRN: [id number] Date of Birth: 08-Feb-1946  [facility]) CM/SW Contact:  Waddell Barnie Rama, RN Phone Number: 04/04/2024, 9:03 AM   Clinical Narrative:    For dc today, has no needs. Spouse to transport her home.         Patient Goals and CMS Choice            Discharge Placement                       Discharge Plan and Services Additional resources added to the After Visit Summary for                                       Social Drivers of Health (SDOH) Interventions SDOH Screenings   Food Insecurity: No Food Insecurity (04/01/2024)  Housing: Low Risk  (04/01/2024)  Transportation Needs: No Transportation Needs (04/01/2024)  Utilities: Not At Risk (04/01/2024)  Social Connections: Socially Integrated (04/01/2024)  Tobacco Use: Low Risk  (04/01/2024)     Readmission Risk Interventions    04/04/2024    9:00 AM  Readmission Risk Prevention Plan  Post Dischage Appt Complete  Medication Screening Complete  Transportation Screening Complete

## 2024-04-04 NOTE — TOC CM/SW Note (Signed)
 Transition of Care [facility]) - Inpatient Brief Assessment   Patient Details  Name: LUCRETIA PENDLEY MRN: [id number] Date of Birth: 04/07/1946  [facility]) CM/SW Contact:    Waddell Barnie Rama, RN Phone Number: 04/04/2024, 9:02 AM   Clinical Narrative: From home with spouse, has PCP and insurance on file, states has no HH services in place at this time  has a walker at home.  States family member (spouse)  will transport them home at [employer] and family is support system,   Pta self ambulatory with walker.   There are no ICM needs identified  at this time.  Please place consult for ICM needs.     Transition of Care Asessment: Insurance and Status: Insurance coverage has been reviewed Patient has primary care physician: Yes Home environment has been reviewed: lives with spouse Prior level of function:: ambulatory with walker Prior/Current Home Services: Current home services (walker) Social Drivers of Health Review: SDOH reviewed no interventions necessary Readmission risk has been reviewed: Yes Transition of care needs: no transition of care needs at this time

## 2024-04-04 NOTE — Telephone Encounter (Signed)
 Patient Product/process development scientist completed.    The patient is insured through [employer]. Patient has Medicare and is not eligible for a copay card, but may be able to apply for patient assistance or Medicare RX Payment Plan (Patient Must reach out to their plan, if eligible for payment plan), if available.    Ran test claim for Farxiga 10 mg and the current 30 day co-pay is $384.49 due to a $250.00 deductible.  Ran test claim for Jardiance  10 mg and the current 30 day co-pay is $284.58 due to a $250.00 deductible.  This test claim was processed through [facility]- copay amounts may vary at other pharmacies due to pharmacy/plan contracts, or as the patient moves through the different stages of their insurance plan.     Reyes Sharps, CPHT Pharmacy Technician III Certified Patient Advocate [facility] Patient Advocate Team Direct Number: [phone]  Fax: [phone]

## 2024-04-04 NOTE — Progress Notes (Signed)
 Physical Therapy Treatment Patient Details Name: Cynthia Vasquez MRN: [id number] DOB: 01-Dec-1945 Today's Date: 04/04/2024   History of Present Illness The pt is a 78 yo female presenting 8/30 with chest pain. Work up revealed acute CHF and acute respiratory failure with hypoxia. Pt with unresponsive episode and soft BP on 8/31, improved with narcan  and fluids. 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    PT Comments  Pt making good progress this morning, largely seen for stair training as pt has 13 stairs to reach her bedroom/bathroom. Pt able to complete with CGA with BUE support on rails, or minA with single UE support on rail. Will need assist from spouse to complete but reports he is available and able to assist. Recommendations remain appropriate.     If plan is discharge home, recommend the following: A little help with walking and/or transfers;A little help with bathing/dressing/bathroom;Assistance with cooking/housework;Assist for transportation;Help with stairs or ramp for entrance;Supervision due to cognitive status   Can travel by private vehicle        Equipment Recommendations   (tub bench)    Recommendations for Other Services       Precautions / Restrictions Precautions Precautions: Fall Recall of Precautions/Restrictions: Impaired Restrictions Weight Bearing Restrictions Per Provider Order: No     Mobility  Bed Mobility Overal bed mobility: Needs Assistance Bed Mobility: Supine to Sit     Supine to sit: Supervision     General bed mobility comments: increased time, use of rails    Transfers Overall transfer level: Needs assistance Equipment used: None Transfers: Sit to/from Stand, Bed to chair/wheelchair/BSC Sit to Stand: Supervision   Step pivot transfers: Supervision       General transfer comment: no assist, able to rise and steady without UE support    Ambulation/Gait Ambulation/Gait assistance: Contact guard assist,  Supervision Gait Distance (Feet): 250 Feet Assistive device: Rolling walker (2 wheels) Gait Pattern/deviations: Step-through pattern, Decreased stride length, Antalgic, Trunk flexed Gait velocity: 0.21 m/s Gait velocity interpretation: <1.31 ft/sec, indicative of household ambulator   General Gait Details: pt with preference for walking behind RW with arms extended, improved posture and positioning in RW with cues   Stairs Stairs: Yes Stairs assistance: Contact guard assist Stair Management: Two rails, Step to pattern, Forwards Number of Stairs: 13 General stair comments: CGA with 2 rails, minA with single rail   Wheelchair Mobility     Tilt Bed    Modified Rankin (Stroke Patients Only)       Balance Overall balance assessment: Needs assistance Sitting-balance support: No upper extremity supported, Feet supported Sitting balance-Leahy Scale: Good     Standing balance support: During functional activity, No upper extremity supported Standing balance-Leahy Scale: Fair Standing balance comment: can ambulate short distance without UE support but reaches for single UE support on furniture                            Communication Communication Communication: No apparent difficulties  Cognition Arousal: Alert Behavior During Therapy: WFL for tasks assessed/performed, Impulsive   PT - Cognitive impairments: Awareness, Memory, Problem solving, Safety/Judgement                       PT - Cognition Comments: more appropriate in session today, not formally assessed Following commands: Impaired Following commands impaired: Follows one step commands inconsistently, Follows one step commands with increased time    Cueing  Cueing Techniques: Verbal cues, Gestural cues, Tactile cues  Exercises      General Comments General comments (skin integrity, edema, etc.): VSS on RA      Pertinent Vitals/Pain Pain Assessment Pain Assessment: No/denies pain      PT Goals (current goals can now be found in the care plan section) Acute Rehab PT Goals Patient Stated Goal: return home PT Goal Formulation: With patient/family Time For Goal Achievement: 04/17/24 Potential to Achieve Goals: Good Progress towards PT goals: Progressing toward goals    Frequency    Min 2X/week       AM-PAC PT 6 Clicks Mobility   Outcome Measure  Help needed turning from your back to your side while in a flat bed without using bedrails?: A Little Help needed moving from lying on your back to sitting on the side of a flat bed without using bedrails?: A Little Help needed moving to and from a bed to a chair (including a wheelchair)?: A Little Help needed standing up from a chair using your arms (e.g., wheelchair or bedside chair)?: A Little Help needed to walk in hospital room?: A Little Help needed climbing 3-5 steps with a railing? : A Lot 6 Click Score: 17    End of Session Equipment Utilized During Treatment: Gait belt Activity Tolerance: Patient tolerated treatment well Patient left: in chair;with call bell/phone within reach Nurse Communication: Mobility status PT Visit Diagnosis: Unsteadiness on feet (R26.81);Other abnormalities of gait and mobility (R26.89);Repeated falls (R29.6);Muscle weakness (generalized) (M62.81);Pain Pain - Right/Left: Right Pain - part of body: Hip     Time: 9091-9072 PT Time Calculation (min) (ACUTE ONLY): 19 min  Charges:    $Gait Training: 8-22 mins                       Izetta Call, PT, DPT   Acute Rehabilitation Department Office [phone] Secure Chat Communication Preferred   Izetta JULIANNA Call 04/04/2024, 10:15 AM

## 2024-04-05 ENCOUNTER — Other Ambulatory Visit (HOSPITAL_BASED_OUTPATIENT_CLINIC_OR_DEPARTMENT_OTHER): Payer: Self-pay

## 2024-04-10 DIAGNOSIS — M25651 Stiffness of right hip, not elsewhere classified: Secondary | ICD-10-CM | POA: Diagnosis not present

## 2024-04-10 DIAGNOSIS — Z96641 Presence of right artificial hip joint: Secondary | ICD-10-CM | POA: Diagnosis not present

## 2024-04-11 DIAGNOSIS — M25551 Pain in right hip: Secondary | ICD-10-CM | POA: Diagnosis not present

## 2024-04-12 NOTE — Progress Notes (Signed)
 Cardiology Office Note   Date:  04/13/2024  ID:  Cynthia Vasquez, DOB 1945/08/11, MRN [id number] PCP: Delayne Artist PARAS, MD  [facility] Providers Cardiologist:  None     History of Present Illness Cynthia Vasquez is a 78 y.o. female with a past medical history of hypertension, estrogen receptor positive breast cancer previously received radiation and was on doxorubicin  and cyclophosphamide  completed 04/10/2020 followed by paclitaxel  completed 06/18/20 now supposed to be on anastrozole , ASD s/p patch (in 1970s), recent hip surgery, anemia due to chemotherapy who was recently admitted to the hospital on 04/01/2024 with substernal chest pain with associated shortness of breath.  She was hypertensive in the ED at 151/60 with bilateral lower extremity edema.  Admitted for suspected new onset heart failure with acute hypoxic respiratory failure, was diuresed and referred to the cardiology office for further management.  Today, the patient states that for the past several months she has been having exertional shortness of breath.  States that if she goes up 1 flight of stairs she needs to rest for about 10 to 15 minutes for the symptoms to resolve.  She also was cleaning a chicken coop recently and became very tired and short of breath and again had to rest.  She has been taking care of her husband because he recently had bypass surgery and so she has not been paying much attention to her own health.  Patient states that she recently had hip surgery and so has been less active recently.  Occasionally has spasms in the hip.  When she had the episode of chest pain, her hip was spasmed and causing significant pain and then she had sudden onset chest burning prompting her to call a family member who recommended that she go to the ED.  In the ED her symptoms resolved with Maalox.  She did have a slightly elevated troponin while she was there which trended down x 3.    No known personal history of cardiac issues.   Family history of MI in her dad in his 24s and sister in her 27s.  Never smoker and nonalcohol drinker.  She has not been needing her as needed Lasix  and has otherwise been feeling okay.    ROS:  Review of Systems  All other systems reviewed and are negative.   Physical Exam  Physical Exam Vitals and nursing note reviewed.  Constitutional:      Appearance: Normal appearance.  HENT:     Head: Normocephalic and atraumatic.  Eyes:     Conjunctiva/sclera: Conjunctivae normal.  Cardiovascular:     Rate and Rhythm: Normal rate and regular rhythm.  Pulmonary:     Effort: Pulmonary effort is normal.     Breath sounds: Normal breath sounds.  Abdominal:     General: Abdomen is flat.     Palpations: Abdomen is soft.  Musculoskeletal:        General: No swelling or tenderness.  Skin:    Coloration: Skin is not jaundiced or pale.  Neurological:     Mental Status: She is alert. Mental status is at baseline.  Psychiatric:        Mood and Affect: Mood normal.        Behavior: Behavior normal.     VS:  BP (!) 116/56 (BP Location: Left Arm, Patient Position: Sitting, Cuff Size: Normal)   Pulse 77   Ht 5' (1.524 m)   Wt 137 lb 6.4 oz (62.3 kg)   LMP  (LMP  Unknown)   SpO2 97%   BMI 26.83 kg/m         Wt Readings from Last 3 Encounters:  04/13/24 137 lb 6.4 oz (62.3 kg)  04/04/24 144 lb 2.9 oz (65.4 kg)  01/13/24 143 lb 9.6 oz (65.1 kg)     EKG Interpretation Date/Time:  Thursday April 13 2024 08:39:52 EDT Ventricular Rate:  77 PR Interval:  146 QRS Duration:  84 QT Interval:  400 QTC Calculation: 452 R Axis:   25  Text Interpretation: Normal sinus rhythm with sinus arrhythmia Normal ECG When compared with ECG of 01-Apr-2024 18:43, No significant change was found Confirmed by Kriste Hicks [phone]) on 04/13/2024 8:42:56 AM    Studies Reviewed   Echocardiogram 04/03/2024: EF 60 to 75% with normal systolic function and no regional wall motion abnormalities. Mildly  dilated RV with mildly reduced systolic function (S' 8.7 cm/s, TAPSE 1.4 cm) Moderate biatrial enlargement Mild aortic valve calcification     Risk Assessment/Calculations              ASSESSMENT  Chest pain, suspected cardiac etiology progressively worsening exertional shortness of breath with an episode of chest burning in the setting of recent hip surgery and spasm.  EKG unremarkable.  Troponin slightly elevated during hospitalization 64->> 36.  Risk factors include: Hypertension, chest radiation, family history of CAD and coronary and aortic calcification on recent CT PE Chronic diastolic heart failure without current exacerbation recent heart failure exacerbation in August with BNP 4355.  On Jardiance , Lasix  20 mg as needed.  Has not required as needed dosing Hypertension Breast cancer s/p chemoradiation with doxorubicin  and cyclophosphamide .  Currently not taking her anastrozole  due to adverse effects. CKD 3a History of ASD s/p patch repair in 1970s  Plan  Will pursue left heart catheterization.  We discussed the risks and benefits of the procedure and the patient is in agreement.  She does not have a contrast allergy. Advised to continue her aspirin  325 mg per recent hip surgery protocol and then decrease to 81 mg daily Will prescribe as needed nitroglycerin  Lipid panel Advised to discuss her anastrozole  with her oncologist Follow up: 1 month     Informed Consent   Shared Decision Making/Informed Consent The risks [stroke (1 in 1000), death (1 in 1000), kidney failure [usually temporary] (1 in 500), bleeding (1 in 200), allergic reaction [possibly serious] (1 in 200)], benefits (diagnostic support and management of coronary artery disease) and alternatives of a cardiac catheterization were discussed in detail with Cynthia Vasquez and she is willing to proceed.       Signed, Hicks FORBES Kriste, MD

## 2024-04-13 ENCOUNTER — Ambulatory Visit: Attending: Internal Medicine | Admitting: Internal Medicine

## 2024-04-13 VITALS — BP 116/56 | HR 77 | Ht 60.0 in | Wt 137.4 lb

## 2024-04-13 DIAGNOSIS — I700 Atherosclerosis of aorta: Secondary | ICD-10-CM

## 2024-04-13 DIAGNOSIS — R072 Precordial pain: Secondary | ICD-10-CM | POA: Diagnosis not present

## 2024-04-13 DIAGNOSIS — I2510 Atherosclerotic heart disease of native coronary artery without angina pectoris: Secondary | ICD-10-CM

## 2024-04-13 DIAGNOSIS — C50919 Malignant neoplasm of unspecified site of unspecified female breast: Secondary | ICD-10-CM | POA: Diagnosis not present

## 2024-04-13 DIAGNOSIS — Z170 Estrogen receptor positive status [ER+]: Secondary | ICD-10-CM | POA: Diagnosis not present

## 2024-04-13 DIAGNOSIS — I5032 Chronic diastolic (congestive) heart failure: Secondary | ICD-10-CM

## 2024-04-13 DIAGNOSIS — I10 Essential (primary) hypertension: Secondary | ICD-10-CM

## 2024-04-13 DIAGNOSIS — Z01812 Encounter for preprocedural laboratory examination: Secondary | ICD-10-CM | POA: Diagnosis not present

## 2024-04-13 MED ORDER — NITROGLYCERIN 0.4 MG SL SUBL
0.4000 mg | SUBLINGUAL_TABLET | SUBLINGUAL | 1 refills | Status: AC | PRN
Start: 2024-04-13 — End: 2024-07-12

## 2024-04-13 NOTE — Patient Instructions (Addendum)
 Medication Instructions:  Your physician has recommended you make the following change in your medication:  Start Nitroglycerin  under tongue as needed for chest pain.  Lab Work: CBC, BMET, Lipid panel -- Today  You may go to any [facility] Location for your lab work:  [facility] - [address] ([facility]) - [address] [phone] [address]  [place] - [address]   [place]  - [address]   [place] - [address] - [address] [employer]  - [address] - [address] ([facility]   If you have labs (blood work) drawn today and your tests are completely normal, you will receive your results only by: [employer] (if you have MyChart)  If you have any lab test that is abnormal or we need to change your treatment, we will call you or send a MyChart message to review the results.  Testing/Procedures:  [facility] A DEPT OF [facility] [address] A DEPT OF THE . [facility] [address] [place] [address] Dept: [phone] Loc: [phone]  Cynthia Vasquez  04/13/2024  You are scheduled for a Cardiac Catheterization on Monday, September 22 with Dr. Gordy Bergamo.  1. Please arrive at the [facility] (Main Entrance A) at [facility]: [address] [place], [place] [address] at 5:30 AM (This time is 2 hour(s) before your procedure to ensure your preparation).   Free valet parking service is available. You will check in at ADMITTING. The support person will be asked to wait in the waiting room.  It is OK to have someone drop you off and come back when you are ready to be discharged.    Special note: Every effort is made to have your procedure done on time. Please understand that emergencies sometimes delay scheduled procedures.  2. Diet: Nothing to eat after midnight.   3. Hydration:  You need to be well hydrated before your procedure. On September 22, you may drink approved liquids (see below) until 2 hours before the procedure, with 16 oz of water as your last intake.   List of approved liquids water, clear juice, clear tea, black coffee, fruit juices, non-citric and without pulp, carbonated beverages, Gatorade, Kool -Aid, plain Jello-O and plain ice popsicles.  4. Labs: Today  5. Medication instructions in preparation for your procedure: Hold your Lasix  the day of procedure.   Contrast Allergy: No  On the morning of your procedure, take your Aspirin  81 mg and any morning medicines NOT listed above.  You may use sips of water.  6. Plan to go home the same day, you will only stay overnight if medically necessary. 7. Bring a current list of your medications and current insurance cards. 8. You MUST have a responsible person to drive you home. 9. Someone MUST be with you the first 24 hours after you arrive home or your discharge will be delayed. 10. Please wear clothes that are easy to get on and off and wear slip-on shoes.  Thank you for allowing us  to care for you!   -- [facility]   Follow-Up: At [facility], you and your health needs are our priority.  As part of our continuing mission to provide you with exceptional heart care, we have created designated Provider Care  Teams.  These Care Teams include your primary Cardiologist (physician) and Advanced Practice Providers (APPs -  Physician Assistants and Nurse Practitioners) who all work together to provide you with the care you need, when you need it.   Your next appointment:   To be determined  The format for your next appointment:   In Person  Provider:   Emeline Calender, DO

## 2024-04-14 DIAGNOSIS — Z96641 Presence of right artificial hip joint: Secondary | ICD-10-CM | POA: Diagnosis not present

## 2024-04-14 DIAGNOSIS — M25651 Stiffness of right hip, not elsewhere classified: Secondary | ICD-10-CM | POA: Diagnosis not present

## 2024-04-14 DIAGNOSIS — I10 Essential (primary) hypertension: Secondary | ICD-10-CM | POA: Diagnosis not present

## 2024-04-14 DIAGNOSIS — Z01812 Encounter for preprocedural laboratory examination: Secondary | ICD-10-CM | POA: Diagnosis not present

## 2024-04-14 DIAGNOSIS — I2510 Atherosclerotic heart disease of native coronary artery without angina pectoris: Secondary | ICD-10-CM | POA: Diagnosis not present

## 2024-04-14 DIAGNOSIS — I700 Atherosclerosis of aorta: Secondary | ICD-10-CM | POA: Diagnosis not present

## 2024-04-14 DIAGNOSIS — I509 Heart failure, unspecified: Secondary | ICD-10-CM | POA: Diagnosis not present

## 2024-04-14 LAB — LIPID PANEL

## 2024-04-15 LAB — CBC
Hematocrit: 28.7 % — ABNORMAL LOW (ref 34.0–46.6)
Hemoglobin: 9.5 g/dL — ABNORMAL LOW (ref 11.1–15.9)
MCH: 30.8 pg (ref 26.6–33.0)
MCHC: 33.1 g/dL (ref 31.5–35.7)
MCV: 93 fL (ref 79–97)
Platelets: 562 x10E3/uL — ABNORMAL HIGH (ref 150–450)
RBC: 3.08 x10E6/uL — ABNORMAL LOW (ref 3.77–5.28)
RDW: 13.4 % (ref 11.7–15.4)
WBC: 6.5 x10E3/uL (ref 3.4–10.8)

## 2024-04-15 LAB — LIPID PANEL
Cholesterol, Total: 212 mg/dL — AB (ref 100–199)
HDL: 76 mg/dL (ref >39)
LDL CALC COMMENT:: 2.8 ratio (ref 0.0–4.4)
LDL Chol Calc (NIH): 117 mg/dL — AB (ref 0–99)
Triglycerides: 106 mg/dL (ref 0–149)
VLDL Cholesterol Cal: 19 mg/dL (ref 5–40)

## 2024-04-15 LAB — BASIC METABOLIC PANEL WITH GFR
BUN/Creatinine Ratio: 24 (ref 12–28)
BUN: 29 mg/dL — AB (ref 8–27)
CO2: 21 mmol/L (ref 20–29)
Calcium: 10 mg/dL (ref 8.7–10.3)
Chloride: 102 mmol/L (ref 96–106)
Creatinine, Ser: 1.19 mg/dL — AB (ref 0.57–1.00)
Glucose: 92 mg/dL (ref 70–99)
Potassium: 5 mmol/L (ref 3.5–5.2)
Sodium: 139 mmol/L (ref 134–144)
eGFR: 47 mL/min/1.73 — AB (ref >59)

## 2024-04-17 ENCOUNTER — Other Ambulatory Visit: Payer: Self-pay | Admitting: Adult Health

## 2024-04-17 ENCOUNTER — Ambulatory Visit: Payer: Self-pay | Admitting: Internal Medicine

## 2024-04-17 DIAGNOSIS — I5032 Chronic diastolic (congestive) heart failure: Secondary | ICD-10-CM | POA: Diagnosis not present

## 2024-04-17 DIAGNOSIS — I951 Orthostatic hypotension: Secondary | ICD-10-CM | POA: Diagnosis not present

## 2024-04-17 DIAGNOSIS — Z79811 Long term (current) use of aromatase inhibitors: Secondary | ICD-10-CM

## 2024-04-18 ENCOUNTER — Telehealth: Payer: Self-pay

## 2024-04-18 DIAGNOSIS — Z96641 Presence of right artificial hip joint: Secondary | ICD-10-CM | POA: Diagnosis not present

## 2024-04-18 DIAGNOSIS — M25651 Stiffness of right hip, not elsewhere classified: Secondary | ICD-10-CM | POA: Diagnosis not present

## 2024-04-18 NOTE — Telephone Encounter (Addendum)
 Called patient to relay message below as per Morna Kendall NP, no answer to phone call left a detailed message for patient to call [facility] to have her bone density scheduled.   ----- Message from Morna JAYSON Kendall sent at 04/17/2024  3:14 PM EDT ----- Please call patient and let her know that we had her ordered for bone density testing at [facility], however they are not doing bone density testing any longer, so we had to change the site to [facility].  It may take a couple months to get in for testing.  They should reach out to schedule, however she could also call [phone].

## 2024-04-20 ENCOUNTER — Encounter (HOSPITAL_COMMUNITY): Payer: Self-pay

## 2024-04-20 ENCOUNTER — Other Ambulatory Visit: Payer: Self-pay

## 2024-04-20 ENCOUNTER — Telehealth: Payer: Self-pay | Admitting: Internal Medicine

## 2024-04-20 ENCOUNTER — Observation Stay (HOSPITAL_COMMUNITY)
Admission: EM | Admit: 2024-04-20 | Discharge: 2024-04-22 | Disposition: A | Discharge status: Home / Self Care | Attending: Emergency Medicine | Admitting: Emergency Medicine

## 2024-04-20 ENCOUNTER — Telehealth: Payer: Self-pay | Admitting: *Deleted

## 2024-04-20 DIAGNOSIS — E7800 Pure hypercholesterolemia, unspecified: Secondary | ICD-10-CM | POA: Insufficient documentation

## 2024-04-20 DIAGNOSIS — Z96641 Presence of right artificial hip joint: Secondary | ICD-10-CM | POA: Insufficient documentation

## 2024-04-20 DIAGNOSIS — I130 Hypertensive heart and chronic kidney disease with heart failure and stage 1 through stage 4 chronic kidney disease, or unspecified chronic kidney disease: Secondary | ICD-10-CM | POA: Insufficient documentation

## 2024-04-20 DIAGNOSIS — I10 Essential (primary) hypertension: Secondary | ICD-10-CM | POA: Diagnosis present

## 2024-04-20 DIAGNOSIS — D649 Anemia, unspecified: Secondary | ICD-10-CM | POA: Diagnosis present

## 2024-04-20 DIAGNOSIS — I5032 Chronic diastolic (congestive) heart failure: Secondary | ICD-10-CM | POA: Insufficient documentation

## 2024-04-20 DIAGNOSIS — R079 Chest pain, unspecified: Secondary | ICD-10-CM | POA: Diagnosis present

## 2024-04-20 DIAGNOSIS — I6529 Occlusion and stenosis of unspecified carotid artery: Secondary | ICD-10-CM | POA: Insufficient documentation

## 2024-04-20 DIAGNOSIS — Z7982 Long term (current) use of aspirin: Secondary | ICD-10-CM | POA: Insufficient documentation

## 2024-04-20 DIAGNOSIS — I779 Disorder of arteries and arterioles, unspecified: Secondary | ICD-10-CM | POA: Diagnosis present

## 2024-04-20 DIAGNOSIS — Z79899 Other long term (current) drug therapy: Secondary | ICD-10-CM | POA: Diagnosis not present

## 2024-04-20 DIAGNOSIS — I5030 Unspecified diastolic (congestive) heart failure: Secondary | ICD-10-CM | POA: Diagnosis present

## 2024-04-20 DIAGNOSIS — I200 Unstable angina: Secondary | ICD-10-CM | POA: Diagnosis not present

## 2024-04-20 DIAGNOSIS — N183 Chronic kidney disease, stage 3 unspecified: Secondary | ICD-10-CM | POA: Diagnosis present

## 2024-04-20 HISTORY — DX: Unstable angina: I20.0

## 2024-04-20 LAB — BASIC METABOLIC PANEL WITH GFR
Anion gap: 9 (ref 5–15)
BUN: 27 mg/dL — ABNORMAL HIGH (ref 8–23)
CO2: 25 mmol/L (ref 22–32)
Calcium: 9.4 mg/dL (ref 8.9–10.3)
Chloride: 106 mmol/L (ref 98–111)
Creatinine, Ser: 1.15 mg/dL — ABNORMAL HIGH (ref 0.44–1.00)
GFR, Estimated: 49 mL/min — ABNORMAL LOW (ref >60)
Glucose, Bld: 174 mg/dL — ABNORMAL HIGH (ref 70–99)
Potassium: 4.6 mmol/L (ref 3.5–5.1)
Sodium: 140 mmol/L (ref 135–145)

## 2024-04-20 LAB — CBC
HCT: 32.5 % — ABNORMAL LOW (ref 36.0–46.0)
Hemoglobin: 10.4 g/dL — ABNORMAL LOW (ref 12.0–15.0)
MCH: 29.8 pg (ref 26.0–34.0)
MCHC: 32 g/dL (ref 30.0–36.0)
MCV: 93.1 fL (ref 80.0–100.0)
Platelets: 441 K/uL — ABNORMAL HIGH (ref 150–400)
RBC: 3.49 MIL/uL — ABNORMAL LOW (ref 3.87–5.11)
RDW: 14.6 % (ref 11.5–15.5)
WBC: 6.2 K/uL (ref 4.0–10.5)
nRBC: 0 % (ref 0.0–0.2)

## 2024-04-20 LAB — TROPONIN I (HIGH SENSITIVITY)
Troponin I (High Sensitivity): 53 ng/L — ABNORMAL HIGH (ref <18)
Troponin I (High Sensitivity): 55 ng/L — ABNORMAL HIGH (ref <18)

## 2024-04-20 MED ORDER — HEPARIN BOLUS VIA INFUSION
3500.0000 [IU] | Freq: Once | INTRAVENOUS | Status: AC
Start: 2024-04-20 — End: 2024-04-20
  Administered 2024-04-20: 3500 [IU] via INTRAVENOUS
  Filled 2024-04-20: qty 3500

## 2024-04-20 MED ORDER — LATANOPROST 0.005 % OP SOLN
1.0000 [drp] | Freq: Every day | OPHTHALMIC | Status: DC
  Administered 2024-04-20 – 2024-04-21 (×2): 1 [drp] via OPHTHALMIC
  Filled 2024-04-20: qty 2.5

## 2024-04-20 MED ORDER — ASPIRIN 300 MG RE SUPP
300.0000 mg | RECTAL | Status: AC
  Filled 2024-04-20: qty 1

## 2024-04-20 MED ORDER — ROSUVASTATIN CALCIUM 20 MG PO TABS
20.0000 mg | ORAL_TABLET | Freq: Every day | ORAL | Status: DC
  Administered 2024-04-20 – 2024-04-22 (×3): 20 mg via ORAL
  Filled 2024-04-20 (×3): qty 1

## 2024-04-20 MED ORDER — METOCLOPRAMIDE HCL 5 MG/ML IJ SOLN: 10.0000 mg | Freq: Once | INTRAMUSCULAR | Status: DC

## 2024-04-20 MED ORDER — ACETAMINOPHEN 325 MG PO TABS
650.0000 mg | ORAL_TABLET | ORAL | Status: DC | PRN
  Administered 2024-04-21 (×2): 650 mg via ORAL
  Filled 2024-04-20 (×2): qty 2

## 2024-04-20 MED ORDER — ASPIRIN 81 MG PO CHEW: 324.0000 mg | CHEWABLE_TABLET | ORAL | Status: AC

## 2024-04-20 MED ORDER — ASPIRIN 81 MG PO TBEC
81.0000 mg | DELAYED_RELEASE_TABLET | Freq: Every day | ORAL | Status: DC
  Administered 2024-04-21 – 2024-04-22 (×2): 81 mg via ORAL
  Filled 2024-04-20 (×2): qty 1

## 2024-04-20 MED ORDER — MAGNESIUM OXIDE -MG SUPPLEMENT 400 (240 MG) MG PO TABS: 400.0000 mg | ORAL_TABLET | Freq: Every day | ORAL | Status: DC

## 2024-04-20 MED ORDER — ACETAMINOPHEN 325 MG PO TABS
650.0000 mg | ORAL_TABLET | Freq: Once | ORAL | Status: AC
  Administered 2024-04-20: 650 mg via ORAL
  Filled 2024-04-20: qty 2

## 2024-04-20 MED ORDER — OXYCODONE HCL 5 MG PO TABS: 5.0000 mg | ORAL_TABLET | ORAL | Status: DC | PRN

## 2024-04-20 MED ORDER — TIZANIDINE HCL 4 MG PO TABS: 2.0000 mg | ORAL_TABLET | Freq: Three times a day (TID) | ORAL | Status: DC | PRN

## 2024-04-20 MED ORDER — EMPAGLIFLOZIN 10 MG PO TABS
10.0000 mg | ORAL_TABLET | Freq: Every day | ORAL | Status: DC
  Administered 2024-04-21 – 2024-04-22 (×2): 10 mg via ORAL
  Filled 2024-04-20 (×2): qty 1

## 2024-04-20 MED ORDER — DOCUSATE SODIUM 100 MG PO CAPS
100.0000 mg | ORAL_CAPSULE | Freq: Two times a day (BID) | ORAL | Status: DC
  Administered 2024-04-21: 100 mg via ORAL
  Filled 2024-04-20 (×2): qty 1

## 2024-04-20 MED ORDER — SODIUM CHLORIDE 0.9 % WEIGHT BASED INFUSION: 1.0000 mL/kg/h | INTRAVENOUS | Status: DC

## 2024-04-20 MED ORDER — ONDANSETRON HCL 4 MG/2ML IJ SOLN: 4.0000 mg | Freq: Four times a day (QID) | INTRAMUSCULAR | Status: DC | PRN

## 2024-04-20 MED ORDER — NITROGLYCERIN IN D5W 200-5 MCG/ML-% IV SOLN
0.0000 ug/min | INTRAVENOUS | Status: DC
  Administered 2024-04-20: 5 ug/min via INTRAVENOUS
  Filled 2024-04-20: qty 250

## 2024-04-20 MED ORDER — NITROGLYCERIN 0.4 MG SL SUBL: 0.4000 mg | SUBLINGUAL_TABLET | SUBLINGUAL | Status: DC | PRN

## 2024-04-20 MED ORDER — PANTOPRAZOLE SODIUM 40 MG PO TBEC
40.0000 mg | DELAYED_RELEASE_TABLET | Freq: Every day | ORAL | Status: DC
  Administered 2024-04-21 – 2024-04-22 (×2): 40 mg via ORAL
  Filled 2024-04-20 (×2): qty 1

## 2024-04-20 MED ORDER — SODIUM CHLORIDE 0.9 % WEIGHT BASED INFUSION
3.0000 mL/kg/h | INTRAVENOUS | Status: DC
  Administered 2024-04-21: 3 mL/kg/h via INTRAVENOUS

## 2024-04-20 MED ORDER — HEPARIN (PORCINE) 25000 UT/250ML-% IV SOLN
700.0000 [IU]/h | INTRAVENOUS | Status: DC
  Administered 2024-04-20: 700 [IU]/h via INTRAVENOUS
  Filled 2024-04-20: qty 250

## 2024-04-20 NOTE — ED Provider Notes (Signed)
 [place] EMERGENCY DEPARTMENT AT [facility] Provider Note   CSN: [id number] Arrival date & time: 04/20/24  1448     Patient presents with: Chest Pain   Cynthia Vasquez is a 78 y.o. female.   HPI     78 year old female comes in with chief complaint of chest pain.  Patient has history of GERD, left-sided breast cancer and CHF.  She states that she had hip replacement in August.  Patient comes in with chief complaint of substernal central chest pain described as burning, heartburn type pain and heaviness.  The pain occurred this morning after she had completed walking her dog.  The pain was extreme for about 15 minutes, and she decided to take nitroglycerin .  Over time the pain subsided.  She is currently pain-free.  With the pain patient had some nausea and mild shortness of breath.  She denies any radiation of the pain.  Patient has remained pain-free since.  Patient was admitted to the hospital for chest pain recently.  She had CT PE which was negative and plan was for her to see cardiology for left-sided heart cath on Monday.  Patient has taken a full dose aspirin  at home.  Prior to Admission medications   Medication Sig Start Date End Date Taking? Authorizing Provider  acetaminophen  (TYLENOL ) 500 MG tablet Take 1 tablet (500 mg total) by mouth every 8 (eight) hours as needed. Take with aleve  220 md 02/15/20   Magrinat, Sandria BROCKS, MD  aspirin  EC 325 MG tablet Take 325 mg by mouth daily.    [provider]  Calcium  Carbonate (CALCIUM  500 PO) Take 1 tablet by mouth in the morning and at bedtime.    [provider]  cholecalciferol  (VITAMIN D) 1000 UNITS tablet Take 1,000 Units by mouth 2 (two) times daily as needed (supplementation).    [provider]  Collagen-Vitamin C -Biotin (COLLAGEN PO) Take 3,000 mg by mouth in the morning and at bedtime. [employer] Collagen Supplement    [provider]  CYANOCOBALAMIN  PO Take 1 capsule by mouth  daily. Dosage unknown    [provider]  empagliflozin  (JARDIANCE ) 10 MG TABS tablet Take 1 tablet (10 mg total) by mouth daily. 04/04/24   Fairy Frames, MD  EQUATE STOOL SOFTENER 100 MG capsule Take 100 mg by mouth 2 (two) times daily. 03/06/24   [provider]  furosemide  (LASIX ) 20 MG tablet Take 1 tablet (20 mg total) by mouth as needed for fluid or edema (and weight gain of 3lb in 1day or 5lb in 1 week). 04/04/24   Fairy Frames, MD  latanoprost  (XALATAN ) 0.005 % ophthalmic solution Place 1 drop into both eyes at bedtime. 07/15/19   [provider]  magnesium  oxide (MAG-OX) 400 (240 Mg) MG tablet Take 400 mg by mouth at bedtime.    [provider]  Misc Natural Products (NEURIVA PO) Take 1 capsule by mouth daily.    [provider]  nitroGLYCERIN  (NITROSTAT ) 0.4 MG SL tablet Place 1 tablet (0.4 mg total) under the tongue every 5 (five) minutes as needed for chest pain. 04/13/24 07/12/24  Segal, Jared E, MD  Omega 3-6-9 Fatty Acids (OMEGA 3-6-9 COMPLEX) CAPS Take 1 capsule by mouth in the morning and at bedtime.    [provider]  omeprazole (PRILOSEC) 20 MG capsule Take 20 mg by mouth daily.    [provider]  oxyCODONE  (OXY IR/ROXICODONE ) 5 MG immediate release tablet Take 5 mg by mouth every 4 (four)  hours as needed. 03/27/24   [provider]  Thiamine HCl (THIAMINE PO) Take 1 tablet by mouth daily. Dosage unknown Patient not taking: Reported on 04/13/2024    [provider]  tiZANidine  (ZANAFLEX ) 2 MG tablet Take 2 mg by mouth every 8 (eight) hours as needed. 03/06/24   [provider]  Turmeric 500 MG TABS Take 1,500 mg by mouth daily.    [provider]    Allergies: Patient has no known allergies.    Review of Systems  All other systems reviewed and are negative.   Updated Vital Signs BP (!) 153/60   Pulse 70   Temp 98.6 F (37 C) (Oral)   Resp 13   Ht 5' (1.524 m)   Wt 62.3 kg    LMP  (LMP Unknown)   SpO2 98%   BMI 26.82 kg/m   Physical Exam Vitals and nursing note reviewed.  Constitutional:      Appearance: She is well-developed.  HENT:     Head: Atraumatic.  Cardiovascular:     Rate and Rhythm: Normal rate.     Heart sounds: Murmur heard.  Pulmonary:     Effort: Pulmonary effort is normal.  Musculoskeletal:     Cervical back: Normal range of motion and neck supple.     Right lower leg: No tenderness. Edema present.     Left lower leg: No tenderness. No edema.  Skin:    General: Skin is warm and dry.  Neurological:     Mental Status: She is alert and oriented to person, place, and time.     (all labs ordered are listed, but only abnormal results are displayed) Labs Reviewed  BASIC METABOLIC PANEL WITH GFR - Abnormal; Notable for the following components:      Result Value   Glucose, Bld 174 (*)    BUN 27 (*)    Creatinine, Ser 1.15 (*)    GFR, Estimated 49 (*)    All other components within normal limits  CBC - Abnormal; Notable for the following components:   RBC 3.49 (*)    Hemoglobin 10.4 (*)    HCT 32.5 (*)    Platelets 441 (*)    All other components within normal limits  TROPONIN I (HIGH SENSITIVITY) - Abnormal; Notable for the following components:   Troponin I (High Sensitivity) 53 (*)    All other components within normal limits  TROPONIN I (HIGH SENSITIVITY)    EKG: None  Date: 04/20/2024  Rate: 76  Rhythm: normal sinus rhythm  QRS Axis: normal  Intervals: normal  ST/T Wave abnormalities: normal  Conduction Disutrbances: none  Narrative Interpretation: unremarkable     Radiology: No results found.   .Critical Care  Performed by: Charlyn Sora, MD Authorized by: Charlyn Sora, MD   Critical care provider statement:    Critical care time (minutes):  48   Critical care was necessary to treat or prevent imminent or life-threatening deterioration of the following conditions:  Cardiac failure   Critical care  was time spent personally by me on the following activities:  Development of treatment plan with patient or surrogate, discussions with consultants, evaluation of patient's response to treatment, examination of patient, ordering and review of laboratory studies, ordering and review of radiographic studies, ordering and performing treatments and interventions, pulse oximetry, re-evaluation of patient's condition, review of old charts and obtaining history from patient or surrogate    Medications Ordered in the ED  acetaminophen  (TYLENOL ) tablet 650 mg (has  no administration in time range)                                    Medical Decision Making Amount and/or Complexity of Data Reviewed Labs: ordered.  Risk OTC drugs. Decision regarding hospitalization.   This patient presents to the ED with chief complaint(s) of chest pain with pertinent past medical history of hypertension and no coronary artery disease, but has angina that is concerning enough for elective catheterization for coming Monday.The complaint involves an extensive differential diagnosis and also carries with it a high risk of complications and morbidity.    The differential diagnosis considered for this patient includes  ACS syndrome Aortic dissection CHF exacerbation Valvular disorder Myocarditis Pericarditis Endocarditis Pericardial effusion / tamponade Pneumonia Pleural effusion / Pulmonary edema PE Pneumothorax Musculoskeletal pain PUD / Gastritis / Esophagitis Esophageal spasm  The initial plan is to get basic labs.  Initial EKG shows no evidence of STEMI.   Additional history obtained: Additional history obtained from spouse Records reviewed previous admission documents  Independent labs interpretation:  The following labs were independently interpreted: Patient's initial troponin is 53.  Patient was discharged with a troponin of 36. She remains chest pain-free.   Treatment and  Reassessment: Results of the ER workup discussed with the patient.  She remains chest pain-free.  She is aware that we will consult cardiology, and quite possibly she will need admission.  Consultation: - Consulted or discussed management/test interpretation with external professional: Dr. Lavona, cardiology. They will admit the patient to cardiology service, requesting initiation of heparin .   Final diagnoses:  Unstable angina [facility])    ED Discharge Orders     None          Charlyn Sora, MD 04/20/24 1859

## 2024-04-20 NOTE — Telephone Encounter (Signed)
 I called patient to review cardiac cath instructions for Monday September 22. Patient tells me this morning she had  burning in her chest/indigestion/shortness of breath, similar symptoms she had previously when she went to ED 04/04/24.  Patient tells me she took 2 NTG with relief of those symptoms, now has some soreness in her chest and headache. Patient advised to seek medical care if she is concerned or feels symptoms not resolved with NTG.   At patient's request, I will forward to Dr Kriste for review.

## 2024-04-20 NOTE — ED Triage Notes (Addendum)
 Patient reports CP starting this morning and generally feeling unwell. Patient also reports she took 2 nitroglycerin  this morning which relieved pain but did not relieve chest pressure.

## 2024-04-20 NOTE — Telephone Encounter (Addendum)
 Cardiac Catheterization scheduled at [facility] for: Monday April 24, 2024 7:30 AM Arrival time [facility] Main Entrance A at: 5:30 AM  Diet: -Nothing to eat after midnight.  Hydration: -May drink clear liquids until 2 hours before the procedure.  Approved liquids: Water , clear tea, black coffee, fruit juices-non-citric and without pulp,Gatorade, plain Jello/popsicles.   -Please drink 16  oz of water  2 hours before procedure.  Medication instructions: -Hold:  Jardiance /Lasix -AM of procedure -Other usual morning medications can be taken including aspirin .  Copied from 04/13/24 Office Note Dr Starlyn to continue her aspirin  325 mg per recent hip surgery protocol and then decrease to 81 mg daily  Plan to go home the same day, you will only stay overnight if medically necessary.  You must have responsible adult to drive you home.  Someone must be with you the first 24 hours after you arrive home.  Reviewed procedure instructions with patient.

## 2024-04-20 NOTE — ED Notes (Signed)
 Cardiology @ bedside.

## 2024-04-20 NOTE — Telephone Encounter (Signed)
 I attempted to contact the patient to discuss her symptoms but she did not answer the phone.  Please contact patient and advise her to go to the ED and not take her Jardiance  today if she has not done so already.    Thank you, Emeline Calender, DO

## 2024-04-20 NOTE — ED Notes (Signed)
 IV team came at the same time that transport came. IV team stated that she will meet the PT upstairs.

## 2024-04-20 NOTE — H&P (Signed)
 CARDIOLOGY ADMISSION NOTE  Patient ID: Cynthia Vasquez MRN: [id number] DOB/AGE: 1945-10-03 78 y.o.  Admit date: 04/20/2024 Primary Physician   Delayne Artist PARAS, MD Primary Cardiologist   Emeline FORBES Calender, MD Chief Complaint    Chest pain  HPI:   The patient was recently seen by Dr. Reeta.  She has a history of an ASD status post patch in the 1970s.  She has otherwise not had prior cardiac history.  She was admitted on 04/01/2024 with substernal chest pain.  It was acute diastolic heart failure reported and she was diuresed and referred as an outpatient for evaluation.  During that hospitalization she was noted to have a slight troponin elevation.  She has some coronary calcium  and aortic calcification.  She had an elevated BNP.  She has been hypertensive.  She does have a history of CKD.  She was set up for cardiac catheterization.  She developed 10/10 chest pain this morning.  This was a heaviness with some nausea but no vomiting.  She had no SOB.  No radiation or change with position.  This was like her previous pain.  She took two NTG before getting some pain relief.    She still has 3/10 discomfort, a headache and she feels dizzy.  EKG is without acute changes.  She does have a mild trop increase compared to the mildly elevated trop last hospitalization.  Second trop is pending.     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   GERD (gastroesophageal reflux disease)    Glaucoma    Headache    migraines until age 28- 71, have them 3-4 x year   History of blood transfusion    with ASD surgery at age 64   History of kidney stones    passed stones   Hypertension    Personal history of chemotherapy    Personal history of radiation therapy    Vitamin D deficiency     Past Surgical History:  Procedure Laterality Date   ASD REPAIR     age 51   BREAST LUMPECTOMY     BREAST LUMPECTOMY WITH RADIOACTIVE SEED  AND AXILLARY LYMPH NODE DISSECTION Left 01/12/2020   Procedure: LEFT BREAST LUMPECTOMY WITH RADIOACTIVE SEED AND LEFT AXILLARY LYMPH NODE DISSECTION;  Surgeon: Ethyl Lenis, MD;  Location: [facility] OR;  Service: General;  Laterality: Left;  PEC BLOCK   COLONOSCOPY     IR REMOVAL TUN ACCESS W/ PORT W/O FL MOD SED  04/10/2021   PORTACATH PLACEMENT Right 02/02/2020   Procedure: INSERTION PORT-A-CATH WITH ULTRASOUND GUIDANCE;  Surgeon: Ethyl Lenis, MD;  Location: [facility];  Service: General;  Laterality: Right;   TOTAL HIP ARTHROPLASTY Right    UPPER GI ENDOSCOPY      No Known Allergies No current facility-administered medications on file prior to encounter.   Current Outpatient Medications on File Prior to Encounter  Medication Sig Dispense Refill   acetaminophen  (TYLENOL ) 500 MG tablet Take 1 tablet (500 mg total) by mouth every 8 (eight) hours as needed. Take with aleve  220 md 60 tablet 0   aspirin  EC 325 MG tablet Take 325 mg by mouth daily.     Calcium  Carbonate (CALCIUM  500 PO) Take 1 tablet by mouth in the morning and at bedtime.     cholecalciferol  (VITAMIN D) 1000 UNITS tablet Take 1,000 Units by mouth  2 (two) times daily as needed (supplementation).     Collagen-Vitamin C -Biotin (COLLAGEN PO) Take 3,000 mg by mouth in the morning and at bedtime. [employer] Collagen Supplement     CYANOCOBALAMIN  PO Take 1 capsule by mouth daily. Dosage unknown     empagliflozin  (JARDIANCE ) 10 MG TABS tablet Take 1 tablet (10 mg total) by mouth daily. 30 tablet 0   EQUATE STOOL SOFTENER 100 MG capsule Take 100 mg by mouth 2 (two) times daily.     furosemide  (LASIX ) 20 MG tablet Take 1 tablet (20 mg total) by mouth as needed for fluid or edema (and weight gain of 3lb in 1day or 5lb in 1 week). 30 tablet 1   latanoprost  (XALATAN ) 0.005 % ophthalmic solution Place 1 drop into both eyes at bedtime.     magnesium  oxide (MAG-OX) 400 (240 Mg) MG tablet Take 400 mg by mouth at bedtime.     Misc Natural  Products (NEURIVA PO) Take 1 capsule by mouth daily.     nitroGLYCERIN  (NITROSTAT ) 0.4 MG SL tablet Place 1 tablet (0.4 mg total) under the tongue every 5 (five) minutes as needed for chest pain. 90 tablet 1   Omega 3-6-9 Fatty Acids (OMEGA 3-6-9 COMPLEX) CAPS Take 1 capsule by mouth in the morning and at bedtime.     omeprazole (PRILOSEC) 20 MG capsule Take 20 mg by mouth daily.     oxyCODONE  (OXY IR/ROXICODONE ) 5 MG immediate release tablet Take 5 mg by mouth every 4 (four) hours as needed.     Thiamine HCl (THIAMINE PO) Take 1 tablet by mouth daily. Dosage unknown (Patient not taking: Reported on 04/13/2024)     tiZANidine  (ZANAFLEX ) 2 MG tablet Take 2 mg by mouth every 8 (eight) hours as needed.     Turmeric 500 MG TABS Take 1,500 mg by mouth daily.     Social History   Socioeconomic History   Marital status: Married    Spouse name: Fairy   Number of children: 3   Years of education: 16   Highest education level: Not on file  Occupational History   Occupation: Runner, broadcasting/film/video    Comment: retired  Tobacco Use   Smoking status: Never   Smokeless tobacco: Never  Vaping Use   Vaping status: Never Used  Substance and Sexual Activity   Alcohol use: No   Drug use: No   Sexual activity: Not on file  Other Topics Concern   Not on file  Social History Narrative   Lives at home with husband   Caffeine use- green tea 1-2 glasses a day   Social Drivers of Corporate investment banker Strain: Not on file  Food Insecurity: No Food Insecurity (04/01/2024)   Hunger Vital Sign    Worried About Running Out of Food in the Last Year: Never true    Ran Out of Food in the Last Year: Never true  Transportation Needs: No Transportation Needs (04/01/2024)   PRAPARE - Administrator, Civil Service (Medical): No    Lack of Transportation (Non-Medical): No  Physical Activity: Not on file  Stress: Not on file  Social Connections: Socially Integrated (04/01/2024)   Social Connection and  Isolation Panel    Frequency of Communication with Friends and Family: More than three times a week    Frequency of Social Gatherings with Friends and Family: More than three times a week    Attends Religious Services: More than 4 times per year    Active  Member of Clubs or Organizations: Yes    Attends Engineer, structural: More than 4 times per year    Marital Status: Married  Catering manager Violence: Not At Risk (04/01/2024)   Humiliation, Afraid, Rape, and Kick questionnaire    Fear of Current or Ex-Partner: No    Emotionally Abused: No    Physically Abused: No    Sexually Abused: No    Family History  Problem Relation Age of Onset   Dementia Mother    Heart attack Father    Hypertension Father    Breast cancer Sister 57     ROS:  As stated in the HPI and negative for all other systems.  Physical Exam: Blood pressure (!) 140/56, pulse 70, temperature 98.6 F (37 C), temperature source Oral, resp. rate 20, height 5' (1.524 m), weight 62.3 kg, SpO2 98%.  GENERAL:  Well appearing HEENT:  Pupils equal round and reactive, fundi not visualized, oral mucosa unremarkable NECK:  No jugular venous distention, waveform within normal limits, carotid upstroke brisk and symmetric, no bruits, no thyromegaly LYMPHATICS:  No cervical, inguinal adenopathy LUNGS:  Clear to auscultation bilaterally BACK:  No CVA tenderness CHEST:  Unremarkable HEART:  PMI not displaced or sustained,S1 and S2 within normal limits, no S3, no S4, no clicks, no rubs, No murmurs ABD:  Flat, positive bowel sounds normal in frequency in pitch, no bruits, no rebound, no guarding, no midline pulsatile mass, no hepatomegaly, no splenomegaly EXT:  2 plus pulses throughout, no edema, no cyanosis no clubbing SKIN:  No rashes no nodules NEURO:  Cranial nerves II through XII grossly intact, motor grossly intact throughout [facility]:  Cognitively intact, oriented to person place and time  ECHO  04/03/2024 1. Left  ventricular ejection fraction, by estimation, is 60 to 65%. The  left ventricle has normal function. The left ventricle has no regional  wall motion abnormalities. Left ventricular diastolic parameters were  normal.   2. Right ventricular systolic function is mildly reduced. The right  ventricular size is mildly enlarged. There is mildly elevated pulmonary  artery systolic pressure. The estimated right ventricular systolic  pressure is 37.2 mmHg.   3. Left atrial size was moderately dilated.   4. Right atrial size was moderately dilated.   5. The mitral valve is normal in structure. No evidence of mitral valve  regurgitation. No evidence of mitral stenosis.   6. Tricuspid valve regurgitation is moderate.   7. The aortic valve is tricuspid. There is mild calcification of the  aortic valve. There is mild thickening of the aortic valve. Aortic valve  regurgitation is not visualized. Aortic valve sclerosis/calcification is  present, without any evidence of  aortic stenosis.   8. The inferior vena cava is dilated in size with >50% respiratory  variability, suggesting right atrial pressure of 8 mmHg.     Labs: Lab Results  Component Value Date   BUN 27 (H) 04/20/2024   Lab Results  Component Value Date   CREATININE 1.15 (H) 04/20/2024   Lab Results  Component Value Date   NA 140 04/20/2024   K 4.6 04/20/2024   CL 106 04/20/2024   CO2 25 04/20/2024   Lab Results  Component Value Date   TROPONINI <0.30 06/11/2013   Lab Results  Component Value Date   WBC 6.2 04/20/2024   HGB 10.4 (L) 04/20/2024   HCT 32.5 (L) 04/20/2024   MCV 93.1 04/20/2024   PLT 441 (H) 04/20/2024   Lab Results  Component  Value Date   CHOL 212 (H) 04/14/2024   HDL 76 04/14/2024   LDLCALC 117 (H) 04/14/2024   TRIG 106 04/14/2024   CHOLHDL 2.8 04/14/2024   Lab Results  Component Value Date   ALT 21 04/02/2024   AST 25 04/02/2024   ALKPHOS 68 04/02/2024   BILITOT 0.7 04/02/2024       Radiology:  CXR:  NA  EKG:  NSR, rate 76, borderline QT prolongation, rightward axis, PACs, no acute ST T wave changes.   ASSESSMENT AND PLAN:   Unstable angina: The patient be heparinized.  We will move her plan cardiac catheterization up and put her on the board for tomorrow.  She will continue aspirin .  Start IV NTG given the on going pain.    She has had the procedure and risk explained to her previously .    Diastolic heart failure: We will hold her embolism.  We can hold her furosemide  in anticipation of cath.  Hypertension: This will be managed in the context of treating her chest pain.    CKD 3A: Creatinine is 1.15 which is down from a peak of 1.73 recently.  Dyslipidemia:    I will start statin with Crestor  20 mg daily.  Discussed with the patient.    SignedBETHA Lynwood Schilling 04/20/2024, 6:03 PM

## 2024-04-20 NOTE — Telephone Encounter (Signed)
 See other phone note by Dr Kriste today.

## 2024-04-20 NOTE — Telephone Encounter (Signed)
 I was able to get in touch with the patient to discuss her chest pain symptoms from this morning.  She states that after she woke up and went downstairs she was reading emails and had recurrent substernal chest burning similar to her symptoms in the past.  After 20 minutes of discomfort she took 1 nitroglycerin  without much improvement.  After 15 more minutes she took a second nitroglycerin  and had significant improvement in her symptoms.  After the second nitroglycerin  her systolic blood pressure was 150 and 15 minutes later came down to 130s since that time she has had a dull ache in the center of her chest and has also felt fatigued throughout the day.  Yesterday she did gardening and yard work and did not have any symptoms at that time.  I advised the patient to go to the ED given the recurrent and persistent low-grade symptoms with fatigue however she is hesitant and does not think she is going to go.  I advised her that if she does not end up going to the emergency department that she contact 911 if she has recurrent chest discomfort.   -Emeline Calender, DO

## 2024-04-20 NOTE — Progress Notes (Signed)
 ANTICOAGULATION CONSULT NOTE  Pharmacy Consult for Heparin  Indication: chest pain/ACS  No Known Allergies  Patient Measurements: Height: 5' (152.4 cm) Weight: 62.3 kg (137 lb 5.6 oz) IBW/kg (Calculated) : 45.5 Heparin  Dosing Weight: 58.5 kg  Vital Signs: Temp: 98.6 F (37 C) (09/18 1720) Temp Source: Oral (09/18 1720) BP: 153/60 (09/18 1815) Pulse Rate: 70 (09/18 1720)  Labs: Recent Labs    04/20/24 1623  HGB 10.4*  HCT 32.5*  PLT 441*  CREATININE 1.15*  TROPONINIHS 53*    Estimated Creatinine Clearance: 33.8 mL/min (A) (by C-G formula based on SCr of 1.15 mg/dL (H)).   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   GERD (gastroesophageal reflux disease)    Glaucoma    Headache    migraines until age 78- 71, have them 3-4 x year   History of blood transfusion    with ASD surgery at age 78   History of kidney stones    passed stones   Hypertension    Personal history of chemotherapy    Personal history of radiation therapy    Vitamin D deficiency     Medications:  (Not in a hospital admission)  Scheduled:   acetaminophen   650 mg Oral Once   metoCLOPramide  (REGLAN ) injection  10 mg Intravenous Once   Infusions:   nitroGLYCERIN      PRN:   Assessment: 78 yof with a history of GERD, left-sided breast cancer and HF. Patient is presenting with chest pain. Heparin  per pharmacy consult placed for chest pain/ACS.  Patient is not on anticoagulation prior to arrival.  Hgb 10.4; plt 441  Goal of Therapy:  Heparin  level 0.3-0.7 units/ml Monitor platelets by anticoagulation protocol: Yes   Plan:  Give IV heparin  3500 units bolus x 1 Start heparin  infusion at 700 units/hr Check anti-Xa level in 8 hours and daily while on heparin  Continue to monitor H&H and platelets  Dorn Buttner, PharmD, BCPS 04/20/2024 7:11 PM ED Clinical Pharmacist -  [phone]

## 2024-04-21 ENCOUNTER — Observation Stay (HOSPITAL_BASED_OUTPATIENT_CLINIC_OR_DEPARTMENT_OTHER)

## 2024-04-21 ENCOUNTER — Encounter (HOSPITAL_COMMUNITY)
Admission: EM | Disposition: A | Discharge status: Home / Self Care | Payer: Self-pay | Source: Home / Self Care | Attending: Emergency Medicine

## 2024-04-21 DIAGNOSIS — Z96641 Presence of right artificial hip joint: Secondary | ICD-10-CM | POA: Diagnosis not present

## 2024-04-21 DIAGNOSIS — D649 Anemia, unspecified: Secondary | ICD-10-CM | POA: Diagnosis not present

## 2024-04-21 DIAGNOSIS — N1831 Chronic kidney disease, stage 3a: Secondary | ICD-10-CM

## 2024-04-21 DIAGNOSIS — I130 Hypertensive heart and chronic kidney disease with heart failure and stage 1 through stage 4 chronic kidney disease, or unspecified chronic kidney disease: Secondary | ICD-10-CM | POA: Diagnosis not present

## 2024-04-21 DIAGNOSIS — I10 Essential (primary) hypertension: Secondary | ICD-10-CM

## 2024-04-21 DIAGNOSIS — R0989 Other specified symptoms and signs involving the circulatory and respiratory systems: Secondary | ICD-10-CM

## 2024-04-21 DIAGNOSIS — R079 Chest pain, unspecified: Secondary | ICD-10-CM | POA: Diagnosis not present

## 2024-04-21 DIAGNOSIS — N183 Chronic kidney disease, stage 3 unspecified: Secondary | ICD-10-CM | POA: Diagnosis not present

## 2024-04-21 DIAGNOSIS — E7800 Pure hypercholesterolemia, unspecified: Secondary | ICD-10-CM | POA: Diagnosis not present

## 2024-04-21 DIAGNOSIS — I6529 Occlusion and stenosis of unspecified carotid artery: Secondary | ICD-10-CM | POA: Diagnosis not present

## 2024-04-21 DIAGNOSIS — I5032 Chronic diastolic (congestive) heart failure: Secondary | ICD-10-CM | POA: Diagnosis not present

## 2024-04-21 DIAGNOSIS — Z7982 Long term (current) use of aspirin: Secondary | ICD-10-CM | POA: Diagnosis not present

## 2024-04-21 DIAGNOSIS — I200 Unstable angina: Secondary | ICD-10-CM | POA: Diagnosis not present

## 2024-04-21 HISTORY — PX: LEFT HEART CATH AND CORONARY ANGIOGRAPHY: CATH118249

## 2024-04-21 LAB — BASIC METABOLIC PANEL WITH GFR
Anion gap: 6 (ref 5–15)
BUN: 23 mg/dL (ref 8–23)
CO2: 24 mmol/L (ref 22–32)
Calcium: 9 mg/dL (ref 8.9–10.3)
Chloride: 109 mmol/L (ref 98–111)
Creatinine, Ser: 1 mg/dL (ref 0.44–1.00)
GFR, Estimated: 58 mL/min — ABNORMAL LOW (ref >60)
Glucose, Bld: 83 mg/dL (ref 70–99)
Potassium: 4.2 mmol/L (ref 3.5–5.1)
Sodium: 139 mmol/L (ref 135–145)

## 2024-04-21 LAB — CBC
HCT: 31.8 % — ABNORMAL LOW (ref 36.0–46.0)
Hemoglobin: 9.8 g/dL — ABNORMAL LOW (ref 12.0–15.0)
MCH: 29.6 pg (ref 26.0–34.0)
MCHC: 30.8 g/dL (ref 30.0–36.0)
MCV: 96.1 fL (ref 80.0–100.0)
Platelets: 364 K/uL (ref 150–400)
RBC: 3.31 MIL/uL — ABNORMAL LOW (ref 3.87–5.11)
RDW: 14.8 % (ref 11.5–15.5)
WBC: 5.7 K/uL (ref 4.0–10.5)
nRBC: 0 % (ref 0.0–0.2)

## 2024-04-21 LAB — HEPARIN LEVEL (UNFRACTIONATED)
Heparin Unfractionated: 0.58 [IU]/mL (ref 0.30–0.70)
Heparin Unfractionated: 0.39 [IU]/mL (ref 0.30–0.70)

## 2024-04-21 LAB — CREATININE, SERUM
Creatinine, Ser: 0.98 mg/dL (ref 0.44–1.00)
GFR, Estimated: 59 mL/min — ABNORMAL LOW (ref >60)

## 2024-04-21 SURGERY — LEFT HEART CATH AND CORONARY ANGIOGRAPHY
Anesthesia: LOCAL

## 2024-04-21 MED ORDER — FENTANYL CITRATE (PF) 100 MCG/2ML IJ SOLN
INTRAMUSCULAR | Status: AC
  Filled 2024-04-21: qty 2

## 2024-04-21 MED ORDER — BOOST / RESOURCE BREEZE PO LIQD CUSTOM
1.0000 | Freq: Three times a day (TID) | ORAL | Status: DC
  Administered 2024-04-21 – 2024-04-22 (×2): 1 via ORAL

## 2024-04-21 MED ORDER — LIDOCAINE HCL (PF) 1 % IJ SOLN
INTRAMUSCULAR | Status: DC | PRN
  Administered 2024-04-21: 2 mL

## 2024-04-21 MED ORDER — MIDAZOLAM HCL 2 MG/2ML IJ SOLN
INTRAMUSCULAR | Status: AC
  Filled 2024-04-21: qty 2

## 2024-04-21 MED ORDER — HEPARIN SODIUM (PORCINE) 1000 UNIT/ML IJ SOLN
INTRAMUSCULAR | Status: DC | PRN
  Administered 2024-04-21: 3000 [IU] via INTRAVENOUS

## 2024-04-21 MED ORDER — MIDAZOLAM HCL 2 MG/2ML IJ SOLN
INTRAMUSCULAR | Status: DC | PRN
  Administered 2024-04-21: 2 mg via INTRAVENOUS

## 2024-04-21 MED ORDER — IOHEXOL 350 MG/ML SOLN
INTRAVENOUS | Status: DC | PRN
  Administered 2024-04-21: 75 mL

## 2024-04-21 MED ORDER — VERAPAMIL HCL 2.5 MG/ML IV SOLN
INTRAVENOUS | Status: AC
  Filled 2024-04-21: qty 2

## 2024-04-21 MED ORDER — LOSARTAN POTASSIUM 25 MG PO TABS
25.0000 mg | ORAL_TABLET | Freq: Every day | ORAL | Status: DC
  Administered 2024-04-21: 25 mg via ORAL
  Filled 2024-04-21: qty 1

## 2024-04-21 MED ORDER — ENOXAPARIN SODIUM 40 MG/0.4ML IJ SOSY
40.0000 mg | PREFILLED_SYRINGE | INTRAMUSCULAR | Status: DC
  Filled 2024-04-21: qty 0.4

## 2024-04-21 MED ORDER — HYDRALAZINE HCL 20 MG/ML IJ SOLN: 10.0000 mg | INTRAMUSCULAR | Status: AC | PRN

## 2024-04-21 MED ORDER — HEPARIN (PORCINE) IN NACL 1000-0.9 UT/500ML-% IV SOLN
INTRAVENOUS | Status: DC | PRN
  Administered 2024-04-21 (×2): 500 mL

## 2024-04-21 MED ORDER — SODIUM CHLORIDE 0.9% FLUSH
3.0000 mL | Freq: Two times a day (BID) | INTRAVENOUS | Status: DC
  Administered 2024-04-21 – 2024-04-22 (×3): 3 mL via INTRAVENOUS

## 2024-04-21 MED ORDER — LABETALOL HCL 5 MG/ML IV SOLN: 10.0000 mg | INTRAVENOUS | Status: AC | PRN

## 2024-04-21 MED ORDER — ORAL CARE MOUTH RINSE: 15.0000 mL | OROMUCOSAL | Status: DC | PRN

## 2024-04-21 MED ORDER — SODIUM CHLORIDE 0.9 % IV SOLN: 250.0000 mL | INTRAVENOUS | Status: DC | PRN

## 2024-04-21 MED ORDER — FENTANYL CITRATE (PF) 100 MCG/2ML IJ SOLN
INTRAMUSCULAR | Status: DC | PRN
  Administered 2024-04-21: 25 ug via INTRAVENOUS

## 2024-04-21 MED ORDER — LIDOCAINE HCL (PF) 1 % IJ SOLN
INTRAMUSCULAR | Status: AC
  Filled 2024-04-21: qty 30

## 2024-04-21 MED ORDER — HEPARIN SODIUM (PORCINE) 1000 UNIT/ML IJ SOLN
INTRAMUSCULAR | Status: AC
  Filled 2024-04-21: qty 10

## 2024-04-21 MED ORDER — SODIUM CHLORIDE 0.9% FLUSH: 3.0000 mL | INTRAVENOUS | Status: DC | PRN

## 2024-04-21 MED ORDER — VERAPAMIL HCL 2.5 MG/ML IV SOLN
INTRAVENOUS | Status: DC | PRN
  Administered 2024-04-21: 10 mL via INTRA_ARTERIAL

## 2024-04-21 MED ORDER — FREE WATER
500.0000 mL | Freq: Once | Status: AC
Start: 2024-04-21 — End: 2024-04-21
  Administered 2024-04-21: 500 mL via ORAL

## 2024-04-21 SURGICAL SUPPLY — 9 items
CATH 5FR JL3.5 JR4 ANG PIG MP (CATHETERS) IMPLANT
CATH INFINITI 5 FR 3DRC (CATHETERS) IMPLANT
CATH LAUNCHER 5F RADR (CATHETERS) IMPLANT
DEVICE RAD TR BAND REGULAR (VASCULAR PRODUCTS) IMPLANT
GLIDESHEATH SLEND SS 6F .021 (SHEATH) IMPLANT
GUIDEWIRE INQWIRE 1.5J.035X260 (WIRE) IMPLANT
PACK CARDIAC CATHETERIZATION (CUSTOM PROCEDURE TRAY) ×1 IMPLANT
SET ATX-X65L (MISCELLANEOUS) IMPLANT
SHEATH PROBE COVER 6X72 (BAG) IMPLANT

## 2024-04-21 NOTE — Plan of Care (Signed)
  Problem: Education: Goal: Knowledge of General Education information will improve Description: Including pain rating scale, medication(s)/side effects and non-pharmacologic comfort measures Outcome: Progressing   Problem: Clinical Measurements: Goal: Ability to maintain clinical measurements within normal limits will improve Outcome: Progressing Goal: Will remain free from infection Outcome: Progressing Goal: Diagnostic test results will improve Outcome: Progressing Goal: Respiratory complications will improve Outcome: Progressing Goal: Cardiovascular complication will be avoided Outcome: Progressing   Problem: Education: Goal: Understanding of cardiac disease, CV risk reduction, and recovery process will improve Outcome: Progressing   Problem: Cardiac: Goal: Ability to achieve and maintain adequate cardiovascular perfusion will improve Outcome: Progressing

## 2024-04-21 NOTE — Care Management Obs Status (Signed)
 MEDICARE OBSERVATION STATUS NOTIFICATION   Patient Details  Name: Cynthia Vasquez MRN: [id number] Date of Birth: 1946-06-28   Medicare Observation Status Notification Given:  Yes    [name] 04/21/2024, 8:51 AM

## 2024-04-21 NOTE — Progress Notes (Signed)
 ANTICOAGULATION CONSULT NOTE  Pharmacy Consult for Heparin  Indication: chest pain/ACS  No Known Allergies  Patient Measurements: Height: 5' (152.4 cm) Weight: 62.6 kg (137 lb 14.4 oz) IBW/kg (Calculated) : 45.5 Heparin  Dosing Weight: 58.5 kg  Vital Signs: Temp: 98.4 F (36.9 C) (09/19 0423) Temp Source: Oral (09/19 0423) BP: 158/56 (09/19 0423) Pulse Rate: 72 (09/19 0423)  Labs: Recent Labs    04/20/24 1623 04/20/24 1828 04/21/24 0503  HGB 10.4*  --   --   HCT 32.5*  --   --   PLT 441*  --   --   HEPARINUNFRC  --   --  0.58  CREATININE 1.15*  --  1.00  TROPONINIHS 53* 55*  --     Estimated Creatinine Clearance: 38.9 mL/min (by C-G formula based on SCr of 1 mg/dL).   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 Take with aleve  220 md (Patient taking differently: Take 500 mg by mouth every 8 (eight) hours as needed for mild pain (pain score 1-3). Take with aleve  220 md) 60 tablet 0 04/20/2024 Noon   aspirin  EC 325 MG tablet Take 325 mg by mouth daily.   04/20/2024 Morning   cholecalciferol  (VITAMIN D) 1000 UNITS tablet Take 1,000 Units by mouth 2 (two) times daily as needed (supplementation).   Past Week   Collagen-Vitamin C -Biotin (COLLAGEN PO) Take 3,000 mg by mouth in the morning and at bedtime. [employer] Collagen Supplement    Past Week   CYANOCOBALAMIN  PO Take 1 capsule by mouth daily. Dosage unknown   Past Week   empagliflozin  (JARDIANCE ) 10 MG TABS tablet Take 1 tablet (10 mg total) by mouth daily. 30 tablet 0 04/20/2024 Morning   EQUATE STOOL SOFTENER 100 MG capsule Take 100 mg by mouth 2 (two) times daily.   Past Week   furosemide  (LASIX ) 20 MG tablet Take 1 tablet (20 mg total) by mouth as needed for fluid or edema (and weight gain of 3lb in 1day or 5lb in 1 week). 30 tablet 1 Past Week   latanoprost  (XALATAN ) 0.005 % ophthalmic solution Place 1 drop into both eyes at bedtime.   04/19/2024 Bedtime   magnesium  oxide (MAG-OX) 400 (240 Mg) MG tablet Take 400 mg by mouth at bedtime.   Past Week   Misc Natural Products (NEURIVA PO) Take 1 capsule by mouth daily.   Past Week   nitroGLYCERIN  (NITROSTAT ) 0.4 MG SL tablet Place 1 tablet (0.4 mg total) under the tongue every 5 (five) minutes as needed for chest pain. 90 tablet 1 04/20/2024 Morning   Omega 3-6-9 Fatty Acids (OMEGA 3-6-9 COMPLEX) CAPS Take 1 capsule by mouth in the morning and at bedtime.   04/19/2024 Evening  omeprazole (PRILOSEC) 20 MG capsule Take 20 mg by mouth daily.   04/20/2024 Morning   tiZANidine  (ZANAFLEX ) 2 MG tablet Take 2 mg by mouth every 8 (eight) hours as needed.   Past Week   Turmeric 500 MG TABS Take 1,500 mg by mouth daily.   Past Week   Calcium  Carbonate (CALCIUM  500 PO) Take 1 tablet by mouth in the morning and at bedtime.      oxyCODONE  (OXY IR/ROXICODONE ) 5 MG immediate release tablet Take 5 mg by mouth every 4 (four) hours as needed. (Patient not taking: Reported on 04/20/2024)   Not Taking   Scheduled:   aspirin   324 mg Oral NOW   Or   aspirin   300 mg Rectal NOW   aspirin  EC  81 mg Oral Daily   docusate sodium   100 mg Oral BID   empagliflozin   10 mg Oral Daily   feeding supplement  1 Container Oral TID BM   latanoprost   1 drop Both Eyes QHS   magnesium  oxide  400 mg Oral QHS   metoCLOPramide  (REGLAN ) injection  10 mg Intravenous  Once   pantoprazole   40 mg Oral Daily   rosuvastatin   20 mg Oral Daily   Infusions:   sodium chloride  1 mL/kg/hr (04/21/24 0534)   heparin  700 Units/hr (04/20/24 2034)   nitroGLYCERIN  10 mcg/min (04/20/24 2200)   PRN: acetaminophen , nitroGLYCERIN , ondansetron  (ZOFRAN ) IV, mouth rinse, oxyCODONE , tiZANidine   Assessment: 78 yof with a history of GERD, left-sided breast cancer and HF. Patient is presenting with chest pain. Heparin  per pharmacy consult placed for chest pain/ACS.  Patient is not on anticoagulation prior to arrival.  Hgb 10.4; plt 441  Heparin  level came back therapeutic. Cont current rate and check 6 hr confirm level.   Goal of Therapy:  Heparin  level 0.3-0.7 units/ml Monitor platelets by anticoagulation protocol: Yes   Plan:  Cont heparin  infusion at 700 units/hr Check confirm level in 6 hours and daily while on heparin  Continue to monitor H&H and platelets  Sergio Batch, PharmD, BCIDP, AAHIVP, CPP Infectious Disease Pharmacist 04/21/2024 6:04 AM

## 2024-04-21 NOTE — Progress Notes (Signed)
  Progress Note  Patient Name: Cynthia Vasquez Date of Encounter: 04/21/2024 [facility] Cardiologist: Emeline FORBES Calender, MD   Interval Summary    No chest pain overnight. Planned for cardiac cath today. Remains on IV NTG.   Vital Signs Vitals:   04/20/24 2036 04/21/24 0001 04/21/24 0423 04/21/24 0731  BP: (!) 159/62 (!) 129/45 (!) 158/56 (!) 151/70  Pulse: 63 81 72   Resp: 16 16 16    Temp: 98.6 F (37 C) 98.6 F (37 C) 98.4 F (36.9 C) 98.4 F (36.9 C)  TempSrc: Oral Oral Oral Oral  SpO2: 97% 92% 97% 95%  Weight: 62.6 kg     Height: 5' (1.524 m)       Intake/Output Summary (Last 24 hours) at 04/21/2024 0954 Last data filed at 04/20/2024 2330 Gross per 24 hour  Intake 480 ml  Output --  Net 480 ml      04/20/2024    8:36 PM 04/20/2024    5:24 PM 04/13/2024    8:33 AM  Last 3 Weights  Weight (lbs) 137 lb 14.4 oz 137 lb 5.6 oz 137 lb 6.4 oz  Weight (kg) 62.551 kg 62.3 kg 62.324 kg      Telemetry/ECG  Sinus Rhythm, PACs - Personally Reviewed  Physical Exam  GEN: No acute distress.   Neck: No JVD Cardiac: RRR, no murmurs, rubs, or gallops.  Respiratory: Clear to auscultation bilaterally. GI: Soft, nontender, non-distended  MS: No edema  Assessment & Plan   78 y.o. 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 Presented to the ED with chest pain.   Unstable Angina -- presented with recurrent chest pain, hsTn 53>>55. Started on IV heparin , NTG with resolution of chest pain.  -- outpatient echo 9/1 with LVEF of 60-65%, no rWMA, mildly reduced RV function, mildly elevated PA pressure, moderate bi-atrial  enlargement, moderate TR -- planned for cardiac cath today -- continue ASA, Crestor    HTN -- BP elevated on admission -- will add losartan  25mg  daily  HLD -- LDL 117, started on Crestor  20mg  daily   Chronic HFpEF -- on jardiance , lasix  held with plans for cardiac cath  Hx of Breast Ca -- s/p chemoradiation with doxorubicin  and cyclophosphamide . Currently not taking her anastrozole  due to adverse effects.   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

## 2024-04-21 NOTE — Interval H&P Note (Signed)
 History and Physical Interval Note:  04/21/2024 1:25 PM  Cynthia Vasquez  has presented today for surgery, with the diagnosis of unstable angina.  The various methods of treatment have been discussed with the patient and family. After consideration of risks, benefits and other options for treatment, the patient has consented to  Procedure(s): LEFT HEART CATH AND CORONARY ANGIOGRAPHY (N/A) as a surgical intervention.  The patient's history has been reviewed, patient examined, no change in status, stable for surgery.  I have reviewed the patient's chart and labs.  Questions were answered to the patient's satisfaction.   Cath Lab Visit (complete for each Cath Lab visit)  Clinical Evaluation Leading to the Procedure:   ACS: Yes.    Non-ACS:    Anginal Classification: CCS IV  Anti-ischemic medical therapy: No Therapy  Non-Invasive Test Results: No non-invasive testing performed  Prior CABG: No previous CABG        Maude [facility] 04/21/2024 1:26 PM

## 2024-04-21 NOTE — H&P (View-Only) (Signed)
  Progress Note  Patient Name: Cynthia Vasquez Date of Encounter: 04/21/2024 [facility] Cardiologist: Emeline FORBES Calender, MD   Interval Summary    No chest pain overnight. Planned for cardiac cath today. Remains on IV NTG.   Vital Signs Vitals:   04/20/24 2036 04/21/24 0001 04/21/24 0423 04/21/24 0731  BP: (!) 159/62 (!) 129/45 (!) 158/56 (!) 151/70  Pulse: 63 81 72   Resp: 16 16 16    Temp: 98.6 F (37 C) 98.6 F (37 C) 98.4 F (36.9 C) 98.4 F (36.9 C)  TempSrc: Oral Oral Oral Oral  SpO2: 97% 92% 97% 95%  Weight: 62.6 kg     Height: 5' (1.524 m)       Intake/Output Summary (Last 24 hours) at 04/21/2024 0954 Last data filed at 04/20/2024 2330 Gross per 24 hour  Intake 480 ml  Output --  Net 480 ml      04/20/2024    8:36 PM 04/20/2024    5:24 PM 04/13/2024    8:33 AM  Last 3 Weights  Weight (lbs) 137 lb 14.4 oz 137 lb 5.6 oz 137 lb 6.4 oz  Weight (kg) 62.551 kg 62.3 kg 62.324 kg      Telemetry/ECG  Sinus Rhythm, PACs - Personally Reviewed  Physical Exam  GEN: No acute distress.   Neck: No JVD Cardiac: RRR, no murmurs, rubs, or gallops.  Respiratory: Clear to auscultation bilaterally. GI: Soft, nontender, non-distended  MS: No edema  Assessment & Plan   78 y.o. 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 Presented to the ED with chest pain.   Unstable Angina -- presented with recurrent chest pain, hsTn 53>>55. Started on IV heparin , NTG with resolution of chest pain.  -- outpatient echo 9/1 with LVEF of 60-65%, no rWMA, mildly reduced RV function, mildly elevated PA pressure, moderate bi-atrial  enlargement, moderate TR -- planned for cardiac cath today -- continue ASA, Crestor    HTN -- BP elevated on admission -- will add losartan  25mg  daily  HLD -- LDL 117, started on Crestor  20mg  daily   Chronic HFpEF -- on jardiance , lasix  held with plans for cardiac cath  Hx of Breast Ca -- s/p chemoradiation with doxorubicin  and cyclophosphamide . Currently not taking her anastrozole  due to adverse effects.   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

## 2024-04-21 NOTE — TOC CM/SW Note (Signed)
 Transition of Care [facility]) - Inpatient Brief Assessment   Patient Details  Name: Cynthia Vasquez MRN: [id number] Date of Birth: 02/19/1946  Transition of Care [facility]) CM/SW Contact:    Sudie Erminio Deems, RN Phone Number: 04/21/2024, 11:54 AM   Clinical Narrative: Patient presented for chest pain. PTA patient reports that she is from home with spouse. Patient has PCP and spouse takes her to appointments. Patient has DME cane and rolling walker in the home. Patient was not active with any [facility] before hospitalization. No home needs identified from Inpatient Case Manger at this time.    Transition of Care Asessment: Insurance and Status: Insurance coverage has been reviewed Patient has primary care physician: Yes Home environment has been reviewed: lives with spouse Prior level of function:: independent with use of walker Prior/Current Home Services: No current home services Social Drivers of Health Review: SDOH reviewed no interventions necessary Readmission risk has been reviewed: Yes Transition of care needs: no transition of care needs at this time

## 2024-04-22 ENCOUNTER — Encounter (HOSPITAL_COMMUNITY): Payer: Self-pay | Admitting: Cardiology

## 2024-04-22 DIAGNOSIS — Z7982 Long term (current) use of aspirin: Secondary | ICD-10-CM | POA: Diagnosis not present

## 2024-04-22 DIAGNOSIS — I5032 Chronic diastolic (congestive) heart failure: Secondary | ICD-10-CM | POA: Diagnosis not present

## 2024-04-22 DIAGNOSIS — E7800 Pure hypercholesterolemia, unspecified: Secondary | ICD-10-CM | POA: Diagnosis not present

## 2024-04-22 DIAGNOSIS — I779 Disorder of arteries and arterioles, unspecified: Secondary | ICD-10-CM | POA: Diagnosis present

## 2024-04-22 DIAGNOSIS — I200 Unstable angina: Principal | ICD-10-CM

## 2024-04-22 DIAGNOSIS — D649 Anemia, unspecified: Secondary | ICD-10-CM | POA: Diagnosis present

## 2024-04-22 DIAGNOSIS — N183 Chronic kidney disease, stage 3 unspecified: Secondary | ICD-10-CM | POA: Diagnosis present

## 2024-04-22 DIAGNOSIS — Z96641 Presence of right artificial hip joint: Secondary | ICD-10-CM | POA: Diagnosis not present

## 2024-04-22 DIAGNOSIS — I130 Hypertensive heart and chronic kidney disease with heart failure and stage 1 through stage 4 chronic kidney disease, or unspecified chronic kidney disease: Secondary | ICD-10-CM | POA: Diagnosis not present

## 2024-04-22 DIAGNOSIS — I6529 Occlusion and stenosis of unspecified carotid artery: Secondary | ICD-10-CM | POA: Diagnosis not present

## 2024-04-22 LAB — BASIC METABOLIC PANEL WITH GFR
Anion gap: 11 (ref 5–15)
BUN: 16 mg/dL (ref 8–23)
CO2: 24 mmol/L (ref 22–32)
Calcium: 9.2 mg/dL (ref 8.9–10.3)
Chloride: 106 mmol/L (ref 98–111)
Creatinine, Ser: 0.93 mg/dL (ref 0.44–1.00)
GFR, Estimated: >60 mL/min (ref >60)
Glucose, Bld: 84 mg/dL (ref 70–99)
Potassium: 3.9 mmol/L (ref 3.5–5.1)
Sodium: 141 mmol/L (ref 135–145)

## 2024-04-22 LAB — CBC
HCT: 29.8 % — ABNORMAL LOW (ref 36.0–46.0)
Hemoglobin: 9.8 g/dL — ABNORMAL LOW (ref 12.0–15.0)
MCH: 30.2 pg (ref 26.0–34.0)
MCHC: 32.9 g/dL (ref 30.0–36.0)
MCV: 91.7 fL (ref 80.0–100.0)
Platelets: 347 K/uL (ref 150–400)
RBC: 3.25 MIL/uL — ABNORMAL LOW (ref 3.87–5.11)
RDW: 14.7 % (ref 11.5–15.5)
WBC: 5.5 K/uL (ref 4.0–10.5)
nRBC: 0 % (ref 0.0–0.2)

## 2024-04-22 MED ORDER — ROSUVASTATIN CALCIUM 20 MG PO TABS: 20.0000 mg | ORAL_TABLET | Freq: Every day | ORAL | 3 refills | Status: AC

## 2024-04-22 NOTE — Progress Notes (Signed)
  Progress Note  Patient Name: Cynthia Vasquez Date of Encounter: 04/22/2024 [facility] Cardiologist: Emeline FORBES Calender, MD   Interval Summary   No further chest pain.  Feeling well without complaint.  Vital Signs Vitals:   04/21/24 1734 04/21/24 1958 04/21/24 2357 04/22/24 0507  BP:  (!) 148/68 (!) 134/51 (!) 148/67  Pulse: 74 72 70 74  Resp:  16 16 15   Temp:  98.1 F (36.7 C) 98.1 F (36.7 C) 98.3 F (36.8 C)  TempSrc:  Oral Oral Oral  SpO2: 97% 98% 94% 95%  Weight:      Height:        Intake/Output Summary (Last 24 hours) at 04/22/2024 0737 Last data filed at 04/22/2024 0600 Gross per 24 hour  Intake 1021.96 ml  Output --  Net 1021.96 ml      04/20/2024    8:36 PM 04/20/2024    5:24 PM 04/13/2024    8:33 AM  Last 3 Weights  Weight (lbs) 137 lb 14.4 oz 137 lb 5.6 oz 137 lb 6.4 oz  Weight (kg) 62.551 kg 62.3 kg 62.324 kg      Telemetry/ECG  Sinus rhythm- Personally Reviewed  Physical Exam  GEN: No acute distress.   Neck: No JVD Cardiac: RRR, no murmurs, rubs, or gallops.  Respiratory: Clear to auscultation bilaterally. GI: Soft, nontender, non-distended  MS: No edema  Assessment & Plan  1.  Chest pain: Left heart catheterization yesterday with torturous coronary arteries but no evidence of coronary artery disease.  She is feeling well today.  Further workup for chest discomfort per primary physician.  2.  Hypertension: Continue home medications  3.  Hyperlipidemia: Continue Crestor   4.  Chronic diastolic heart failure: No obvious volume overload.     For questions or updates, please contact [facility] Please consult [email or web address] for contact info under         Signed, Derrick Tiegs Gladis Norton, MD

## 2024-04-22 NOTE — Plan of Care (Signed)
°  Problem: Activity: Goal: Ability to return to baseline activity level will improve Outcome: Adequate for Discharge   Problem: Cardiovascular: Goal: Ability to achieve and maintain adequate cardiovascular perfusion will improve Outcome: Adequate for Discharge Goal: Vascular access site(s) Level 0-1 will be maintained Outcome: Adequate for Discharge

## 2024-04-22 NOTE — Discharge Summary (Addendum)
 Discharge Summary   Patient ID: Cynthia Vasquez MRN: [id number]; DOB: [birth year]  Admit date: 04/20/2024 Discharge date: 04/22/2024  PCP:  Delayne Artist PARAS, Cynthia Vasquez   [facility] Providers Cardiologist:  Emeline FORBES Calender, Cynthia Vasquez       Discharge Diagnoses  Principal Problem:   Unstable angina [facility]) Active Problems:   (HFpEF) heart failure with preserved ejection fraction (HCC)   Carotid artery disease (HCC)   Hypertension   Pure hypercholesterolemia   CKD (chronic kidney disease) stage 3, GFR 30-59 ml/min (HCC)   Normocytic anemia   Diagnostic Studies/Procedures  LEFT HEART CATH AND CORONARY ANGIOGRAPHY 04/21/2024 Conclusion   The left ventricular systolic function is normal.   LV end diastolic pressure is normal.   The left ventricular ejection fraction is 55-65% by visual estimate.  Large very tortuous coronary arteries without CAD Normal LV function Normal LVEDP  Plan: consider other causes of chest pain.   VAS US  CAROTID DUPLEX BILATERAL 04/21/2024 Summary: Right Carotid: Velocities in the right ICA are consistent with a 40-59% stenosis, highest end of range.  Left Carotid: Velocities in the left ICA are consistent with a 1-39% stenosis, highest end of range.  Vertebrals:  Bilateral vertebral arteries demonstrate antegrade flow. Subclavians: Normal flow hemodynamics were seen in bilateral subclavian arteries.  _____________   History of Present Illness   Cynthia Vasquez is a 78 y.o. female with hx of ASD repair in the 1970s, (HFpEF) heart failure with preserved ejection fraction (admitted 04/01/24), coronary artery Ca2+, aortic atherosclerosis, hypertension, hyperlipidemia, anemia, chronic kidney disease, breast CA and recent hip surgery who had been seen in follow up in the office with Dr. Calender 04/13/24 and set up for cardiac catheterization to evaluate chest pain. Of note, Cynthia had recent hip surgery prior to her admission in Aug 2025 for CHF. CT was neg for pulmonary  embolism at that time. Cynthia presented to the hospital on 9/18 with severe chest pain. Her hsTrops were mildly elevated and Cynthia was admitted for further evaluation and management.   Hospital Course   Consultants:    The patient was admitted for unstable angina and started on IV Heparin  and IV NTG for ongoing chest pain. Her hsTrops remained mildly elevated and flat (53>>55). Hgb remained low but stable at ~ 10. Her symptoms improved on NTG, Heparin . Cynthia was set up for cardiac catheterization which demonstrated large, very tortuous coronary arteries without CAD. EF and LVEDP were normal. Her chest pain was felt to be noncardiac. Cynthia was evaluated by Dr. Inocencio this morning. Cynthia is feeling better w/o recurrent chest pain. Cynthia is felt to be stable and ready for DC to home. Cynthia Vasquez need continued evaluation with primary care for other causes of chest pain.   Cynthia was noted to have a carotid bruit. US  demonstrated RICA 40-59, LICA 1-39. Cynthia Vasquez continue ASA and start Rosuvastatin  20 mg once daily.      Did the patient have an acute coronary syndrome (MI, NSTEMI, STEMI, etc) this admission?:  No.   The elevated Troponin was due to the acute medical illness (demand ischemia).    _____________  Discharge Vitals Blood pressure 137/61, pulse 67, temperature 98.7 F (37.1 C), temperature source Oral, resp. rate 14, height 5' (1.524 m), weight 62.6 kg, SpO2 95%.  Filed Weights   04/20/24 1724 04/20/24 2036  Weight: 62.3 kg 62.6 kg    Labs & Radiologic Studies  CBC Recent Labs    04/21/24 1520 04/22/24 0519  WBC 5.7  5.5  HGB 9.8* 9.8*  HCT 31.8* 29.8*  MCV 96.1 91.7  PLT 364 347   Basic Metabolic Panel Recent Labs    90/80/74 0503 04/21/24 1945 04/22/24 0519  NA 139  --  141  K 4.2  --  3.9  CL 109  --  106  CO2 24  --  24  GLUCOSE 83  --  84  BUN 23  --  16  CREATININE 1.00 0.98 0.93  CALCIUM  9.0  --  9.2    High Sensitivity Troponin:   Recent Labs  Lab 04/01/24 2033  04/20/24 1623 04/20/24 1828  TROPONINIHS 36* 53* 55*    Recent Labs  Lab 04/01/24 0345 04/01/24 0518  TRNPT 64* 57*    _____________    Disposition Pt is being discharged home today in good condition.  Follow-up Plans & Appointments  Follow-up Information     Cynthia Emeline BRAVO, Cynthia Vasquez Follow up on 05/10/2024.   Specialty: Cardiology Why: Hospital Follow Up  Appt Time:  8:00 am Please arrive 15 min early. Bring all medications. Contact information: [address] [place] [address] [phone]                Discharge Instructions     (HEART FAILURE PATIENTS) Call Cynthia Vasquez:  Anytime you have any of the following symptoms: 1) 3 pound weight gain in 24 hours or 5 pounds in 1 week 2) shortness of breath, with or without a dry hacking cough 3) swelling in the hands, feet or stomach 4) if you have to sleep on extra pillows at night in order to breathe.   Complete by: As directed    Diet - low sodium heart healthy   Complete by: As directed    Discharge wound care:   Complete by: As directed    Call [phone] for any swelling, bleeding, bruising or fever.   Driving Restrictions   Complete by: As directed    No driving for 3 days   Increase activity slowly   Complete by: As directed    Lifting restrictions   Complete by: As directed    No lifting over 5 lbs for 2 weeks       Discharge Medications Allergies as of 04/22/2024   No Known Allergies      Medication List     TAKE these medications    acetaminophen  500 MG tablet Commonly known as: TYLENOL  Take 1 tablet (500 mg total) by mouth every 8 (eight) hours as needed. Take with aleve  220 Cynthia Vasquez What changed: reasons to take this   aspirin  EC 325 MG tablet Take 325 mg by mouth daily.   CALCIUM  500 PO Take 1 tablet by mouth in the morning and at bedtime.   cholecalciferol  1000 units tablet Commonly known as: VITAMIN D Take 1,000 Units by mouth 2 (two) times daily as needed (supplementation).    COLLAGEN PO Take 3,000 mg by mouth in the morning and at bedtime. [employer] Collagen Supplement   CYANOCOBALAMIN  PO Take 1 capsule by mouth daily. Dosage unknown   EQUATE STOOL SOFTENER 100 MG capsule Generic drug: docusate sodium  Take 100 mg by mouth 2 (two) times daily.   furosemide  20 MG tablet Commonly known as: LASIX  Take 1 tablet (20 mg total) by mouth as needed for fluid or edema (and weight gain of 3lb in 1day or 5lb in 1 week).   Jardiance  10 MG Tabs tablet Generic drug: empagliflozin  Take 1 tablet (10 mg total) by mouth  daily.   latanoprost  0.005 % ophthalmic solution Commonly known as: XALATAN  Place 1 drop into both eyes at bedtime.   magnesium  oxide 400 (240 Mg) MG tablet Commonly known as: MAG-OX Take 400 mg by mouth at bedtime.   NEURIVA PO Take 1 capsule by mouth daily.   nitroGLYCERIN  0.4 MG SL tablet Commonly known as: NITROSTAT  Place 1 tablet (0.4 mg total) under the tongue every 5 (five) minutes as needed for chest pain.   Omega 3-6-9 Complex Caps Take 1 capsule by mouth in the morning and at bedtime.   omeprazole 20 MG capsule Commonly known as: PRILOSEC Take 20 mg by mouth daily.   oxyCODONE  5 MG immediate release tablet Commonly known as: Oxy IR/ROXICODONE  Take 5 mg by mouth every 4 (four) hours as needed.   rosuvastatin  20 MG tablet Commonly known as: CRESTOR  Take 1 tablet (20 mg total) by mouth daily.   tiZANidine  2 MG tablet Commonly known as: ZANAFLEX  Take 2 mg by mouth every 8 (eight) hours as needed.   Turmeric 500 MG Tabs Take 1,500 mg by mouth daily.               Discharge Care Instructions  (From admission, onward)           Start     Ordered   04/22/24 0000  Discharge wound care:       Comments: Call [phone] for any swelling, bleeding, bruising or fever.   04/22/24 0825             Outstanding Labs/Studies Fasting Lipids and LFTs Cynthia Vasquez need to be arranged at Follow up   Duration of Discharge  Encounter: APP Time: 31 minutes   Signed, Cynthia Ferrier, Cynthia Vasquez 04/22/2024, 8:27 AM    I have seen and examined this patient with Cynthia Vasquez.  Agree with above, note added to reflect my findings.  Patient presented to the hospital with chest pain.  Cynthia has had issues with chest pain over the last few months.  Cynthia had left heart catheterization which showed no evidence of coronary artery disease but tortuous arteries.  Cynthia Cynthia Vasquez be discharged today with follow-up in primary physician's clinic.  No further cardiac workup at this time.  GEN: No acute distress.   Neck: No JVD Cardiac: RRR, no murmurs, rubs, or gallops.  Respiratory: normal BS bases bilaterally. GI: Soft, nontender, non-distended  MS: No edema; No deformity. Neuro:  Nonfocal  Skin: warm and dry Psych: Normal affect    Cynthia Vasquez time at discharge: 35 minutes  Cynthia Potenza M. Korie Brabson Cynthia Vasquez 04/22/2024 8:48 AM

## 2024-04-22 NOTE — Progress Notes (Signed)
 Discharge Nurse Summary: DC order noted per MD. DC RN at bedside with patient. Patient agreeable with discharge plan, states husband will arrive soon for pickup.   AVS printed/reviewed. PIV removed, skin intact. L heart cath site CDI. No s/s bleeding. No DME needs. No home/TOC meds. Prescriptions confirmed sent to preferred Rx. CP/Edu resolved. Telemonitor returned to charging station. All belongings accounted for. Husband at the beside. Patient wheeled downstairs for discharge by private auto.   Rosario EMERSON Lund, RN

## 2024-04-23 ENCOUNTER — Encounter (HOSPITAL_COMMUNITY): Payer: Self-pay | Admitting: Cardiology

## 2024-04-24 ENCOUNTER — Ambulatory Visit (HOSPITAL_COMMUNITY): Admission: RE | Admit: 2024-04-24 | Source: Home / Self Care | Admitting: Cardiology

## 2024-04-24 ENCOUNTER — Encounter (HOSPITAL_COMMUNITY): Admission: RE | Payer: Self-pay | Source: Home / Self Care

## 2024-04-24 SURGERY — LEFT HEART CATH AND CORONARY ANGIOGRAPHY
Anesthesia: LOCAL

## 2024-04-25 LAB — LIPOPROTEIN A (LPA): Lipoprotein (a): 11.1 nmol/L (ref <75.0)

## 2024-04-27 ENCOUNTER — Ambulatory Visit: Payer: Self-pay | Admitting: Cardiology

## 2024-05-01 DIAGNOSIS — N1831 Chronic kidney disease, stage 3a: Secondary | ICD-10-CM | POA: Diagnosis not present

## 2024-05-01 DIAGNOSIS — K219 Gastro-esophageal reflux disease without esophagitis: Secondary | ICD-10-CM | POA: Diagnosis not present

## 2024-05-01 DIAGNOSIS — I5032 Chronic diastolic (congestive) heart failure: Secondary | ICD-10-CM | POA: Diagnosis not present

## 2024-05-01 DIAGNOSIS — Z23 Encounter for immunization: Secondary | ICD-10-CM | POA: Diagnosis not present

## 2024-05-10 ENCOUNTER — Ambulatory Visit: Attending: Cardiology | Admitting: Internal Medicine

## 2024-05-10 ENCOUNTER — Encounter: Payer: Self-pay | Admitting: Internal Medicine

## 2024-05-10 ENCOUNTER — Other Ambulatory Visit (HOSPITAL_COMMUNITY): Payer: Self-pay

## 2024-05-10 VITALS — BP 136/78 | HR 78 | Ht 60.0 in | Wt 136.2 lb

## 2024-05-10 DIAGNOSIS — I5032 Chronic diastolic (congestive) heart failure: Secondary | ICD-10-CM | POA: Diagnosis not present

## 2024-05-10 DIAGNOSIS — I6523 Occlusion and stenosis of bilateral carotid arteries: Secondary | ICD-10-CM | POA: Diagnosis not present

## 2024-05-10 DIAGNOSIS — R072 Precordial pain: Secondary | ICD-10-CM

## 2024-05-10 DIAGNOSIS — I700 Atherosclerosis of aorta: Secondary | ICD-10-CM | POA: Diagnosis not present

## 2024-05-10 NOTE — Patient Instructions (Signed)
 Medication Instructions:  Your physician recommends that you continue on your current medications as directed. Please refer to the Current Medication list given to you today.  *If you need a refill on your cardiac medications before your next appointment, please call your pharmacy*  Lab Work: none If you have labs (blood work) drawn today and your tests are completely normal, you will receive your results only by: MyChart Message (if you have MyChart) OR A paper copy in the mail If you have any lab test that is abnormal or we need to change your treatment, we will call you to review the results.  Testing/Procedures: none  Follow-Up: At [facility], you and your health needs are our priority.  As part of our continuing mission to provide you with exceptional heart care, our providers are all part of one team.  This team includes your primary Cardiologist (physician) and Advanced Practice Providers or APPs (Physician Assistants and Nurse Practitioners) who all work together to provide you with the care you need, when you need it.  Your next appointment:   1 year  Provider:   Emeline FORBES Calender, MD    We recommend signing up for the patient portal called MyChart.  Sign up information is provided on this After Visit Summary.  MyChart is used to connect with patients for Virtual Visits (Telemedicine).  Patients are able to view lab/test results, encounter notes, upcoming appointments, etc.  Non-urgent messages can be sent to your provider as well.   To learn more about what you can do with MyChart, go to [email or web address].   Other Instructions none

## 2024-05-10 NOTE — Progress Notes (Signed)
 Cardiology Office Note   Date:  05/10/2024  ID:  Cynthia Vasquez, DOB Aug 28, 1945, MRN [id number] PCP: Delayne Artist PARAS, MD  [facility] Providers Cardiologist:  Emeline FORBES Calender, MD     History of Present Illness Cynthia Vasquez is a 78 y.o. female with a past medical history of hypertension, estrogen receptor positive breast cancer previously received radiation and was on doxorubicin  and cyclophosphamide  completed 04/10/2020 followed by paclitaxel  completed 06/18/20 now supposed to be on anastrozole , ASD s/p patch (in 1970s), recent hip surgery, anemia due to chemotherapy who was recently admitted to the hospital on 04/01/2024 with substernal chest pain with associated shortness of breath.  She was hypertensive in the ED at 151/60 with bilateral lower extremity edema.  Admitted for suspected new onset heart failure with acute hypoxic respiratory failure and elevated troponin, was diuresed and referred to the cardiology office for further management.  When I last saw the patient on 04/13/24, she was having typical chest pain symptoms and was referred for left heart catheterization.  After our visit the patient was having worsening chest pain concerning for unstable angina and went to the emergency room and underwent left heart catheterization on 04/21/2024.  Heart cath showed large and very tortuous coronary arteries without CAD and normal LVEDP.  She presents today for follow-up  Since being in the hospital she has had improvement in her chest pain but continues to have episodes of volume overload and has been taking Lasix  about 3 times weekly for swelling.  Her chest pain has been a burning sensation and so she feels maybe it is due to acid reflux.  She has made an appointment with GI at the beginning of November.  Also, she has not been taking the aspirin  or rosuvastatin  or Farxiga (cost).  Otherwise no complaints.  ROS:  Review of Systems  All other systems reviewed and are  negative.   Physical Exam  Physical Exam Vitals and nursing note reviewed.  Constitutional:      Appearance: Normal appearance.  HENT:     Head: Normocephalic and atraumatic.  Eyes:     Conjunctiva/sclera: Conjunctivae normal.  Neck:     Vascular: Carotid bruit present.  Cardiovascular:     Rate and Rhythm: Normal rate and regular rhythm.  Pulmonary:     Effort: Pulmonary effort is normal.     Breath sounds: Normal breath sounds.  Musculoskeletal:        General: No swelling or tenderness.  Skin:    Coloration: Skin is not jaundiced or pale.  Neurological:     Mental Status: She is alert.     VS:  BP 136/78 (BP Location: Left Arm, Patient Position: Sitting, Cuff Size: Normal)   Pulse 78   Ht 5' (1.524 m)   Wt 136 lb 3.2 oz (61.8 kg)   LMP  (LMP Unknown)   BMI 26.60 kg/m         Wt Readings from Last 3 Encounters:  05/10/24 136 lb 3.2 oz (61.8 kg)  04/20/24 137 lb 14.4 oz (62.6 kg)  04/13/24 137 lb 6.4 oz (62.3 kg)     EKG Interpretation Date/Time:    Ventricular Rate:    PR Interval:    QRS Duration:    QT Interval:    QTC Calculation:   R Axis:      Text Interpretation:      Studies Reviewed   Echocardiogram 04/03/2024: EF 60 to 75% with normal systolic function and no regional wall  motion abnormalities. Mildly dilated RV with mildly reduced systolic function (S' 8.7 cm/s, TAPSE 1.4 cm) Moderate biatrial enlargement Mild aortic valve calcification  Bilateral carotid duplex 04/21/2024: Summary:  Right Carotid: Velocities in the right ICA are consistent with a 40-59% stenosis, highest end of range.  Left Carotid: Velocities in the left ICA are consistent with a 1-39% stenosis, highest end of range.   Left heart catheterization 04/21/2024: Large very tortuous coronary arteries without CAD Normal LV function Normal LVEDP    Risk Assessment/Calculations              ASSESSMENT  Noncardiac chest pain -recent left heart catheterization  showing large and tortuous coronaries without CAD.  Possibly GERD.  Has an appointment with GI Chronic diastolic heart failure with episodes of volume overload at home that is treated with Lasix  as needed.  Has not been taking Jardiance  due to cost. Bruit secondary to bilateral carotid artery disease has not been taking aspirin  and statin Hypertension Breast cancer s/p chemoradiation with doxorubicin  and cyclophosphamide .  Currently not taking her anastrozole  due to adverse effects. CKD 3a History of ASD s/p patch repair in 1970s  Plan  Patient advised to take aspirin  81 mg and rosuvastatin  20 mg as previously prescribed for her carotid artery disease Will check on price for Farxiga and Jardiance  with pharmacy  Follow up: 1 year        Signed, Emeline FORBES Calender, MD

## 2024-06-13 ENCOUNTER — Telehealth: Payer: Self-pay | Admitting: Internal Medicine

## 2024-06-13 NOTE — Telephone Encounter (Signed)
 Patient states she was returning call. Please advise  ?

## 2024-06-13 NOTE — Telephone Encounter (Signed)
 Returned patient's call, used 2 identifiers. Patient states she received a phone call that she thought was from our office regarding results. There are no recent telephone encounters in epic, patient states she can no longer find the message and is not sure it really was from our office. Advised patient to investigate further and let us  know if she has any questions.

## 2024-06-14 DIAGNOSIS — N1831 Chronic kidney disease, stage 3a: Secondary | ICD-10-CM | POA: Diagnosis not present

## 2024-06-14 DIAGNOSIS — Z0000 Encounter for general adult medical examination without abnormal findings: Secondary | ICD-10-CM | POA: Diagnosis not present

## 2024-06-14 DIAGNOSIS — I10 Essential (primary) hypertension: Secondary | ICD-10-CM | POA: Diagnosis not present

## 2024-06-15 DIAGNOSIS — Z1211 Encounter for screening for malignant neoplasm of colon: Secondary | ICD-10-CM | POA: Diagnosis not present

## 2024-06-15 DIAGNOSIS — K219 Gastro-esophageal reflux disease without esophagitis: Secondary | ICD-10-CM | POA: Diagnosis not present

## 2024-07-11 DIAGNOSIS — H401131 Primary open-angle glaucoma, bilateral, mild stage: Secondary | ICD-10-CM | POA: Diagnosis not present

## 2024-07-11 DIAGNOSIS — H04123 Dry eye syndrome of bilateral lacrimal glands: Secondary | ICD-10-CM | POA: Diagnosis not present

## 2024-07-11 DIAGNOSIS — H2513 Age-related nuclear cataract, bilateral: Secondary | ICD-10-CM | POA: Diagnosis not present

## 2024-07-14 DIAGNOSIS — E7800 Pure hypercholesterolemia, unspecified: Secondary | ICD-10-CM | POA: Diagnosis not present

## 2024-07-14 DIAGNOSIS — N1831 Chronic kidney disease, stage 3a: Secondary | ICD-10-CM | POA: Diagnosis not present

## 2024-07-14 DIAGNOSIS — I10 Essential (primary) hypertension: Secondary | ICD-10-CM | POA: Diagnosis not present

## 2025-01-12 ENCOUNTER — Inpatient Hospital Stay: Admitting: Hematology and Oncology
